# Patient Record
Sex: Male | Born: 1955 | Race: Black or African American | Hispanic: No | Smoking: Current every day smoker
Health system: Southern US, Community
[De-identification: ages and names within clinical notes are randomized; demographics above are authoritative.]

## PROBLEM LIST (undated history)

## (undated) DIAGNOSIS — I639 Cerebral infarction, unspecified: Secondary | ICD-10-CM

## (undated) DIAGNOSIS — D126 Benign neoplasm of colon, unspecified: Secondary | ICD-10-CM

## (undated) DIAGNOSIS — Z21 Asymptomatic human immunodeficiency virus [HIV] infection status: Secondary | ICD-10-CM

## (undated) DIAGNOSIS — C61 Malignant neoplasm of prostate: Secondary | ICD-10-CM

## (undated) DIAGNOSIS — K603 Anal fistula, unspecified: Secondary | ICD-10-CM

## (undated) DIAGNOSIS — G43909 Migraine, unspecified, not intractable, without status migrainosus: Secondary | ICD-10-CM

## (undated) DIAGNOSIS — K219 Gastro-esophageal reflux disease without esophagitis: Secondary | ICD-10-CM

## (undated) DIAGNOSIS — K5790 Diverticulosis of intestine, part unspecified, without perforation or abscess without bleeding: Secondary | ICD-10-CM

## (undated) DIAGNOSIS — G44219 Episodic tension-type headache, not intractable: Secondary | ICD-10-CM

## (undated) DIAGNOSIS — G44009 Cluster headache syndrome, unspecified, not intractable: Secondary | ICD-10-CM

## (undated) DIAGNOSIS — J189 Pneumonia, unspecified organism: Secondary | ICD-10-CM

## (undated) DIAGNOSIS — I10 Essential (primary) hypertension: Secondary | ICD-10-CM

## (undated) DIAGNOSIS — I700 Atherosclerosis of aorta: Secondary | ICD-10-CM

## (undated) DIAGNOSIS — K449 Diaphragmatic hernia without obstruction or gangrene: Secondary | ICD-10-CM

## (undated) DIAGNOSIS — B20 Human immunodeficiency virus [HIV] disease: Secondary | ICD-10-CM

## (undated) DIAGNOSIS — Z973 Presence of spectacles and contact lenses: Secondary | ICD-10-CM

## (undated) DIAGNOSIS — Z972 Presence of dental prosthetic device (complete) (partial): Secondary | ICD-10-CM

## (undated) DIAGNOSIS — K862 Cyst of pancreas: Secondary | ICD-10-CM

## (undated) DIAGNOSIS — K921 Melena: Secondary | ICD-10-CM

## (undated) HISTORY — DX: Essential (primary) hypertension: I10

## (undated) HISTORY — DX: Pneumonia, unspecified organism: J18.9

## (undated) HISTORY — DX: Human immunodeficiency virus (HIV) disease: B20

## (undated) HISTORY — DX: Melena: K92.1

## (undated) HISTORY — DX: Asymptomatic human immunodeficiency virus (hiv) infection status: Z21

## (undated) HISTORY — DX: Cerebral infarction, unspecified: I63.9

## (undated) HISTORY — DX: Diaphragmatic hernia without obstruction or gangrene: K44.9

## (undated) HISTORY — PX: OTHER SURGICAL HISTORY: SHX169

## (undated) HISTORY — PX: COLONOSCOPY: SHX174

## (undated) HISTORY — DX: Diverticulosis of intestine, part unspecified, without perforation or abscess without bleeding: K57.90

## (undated) HISTORY — DX: Anal fistula: K60.3

## (undated) HISTORY — DX: Migraine, unspecified, not intractable, without status migrainosus: G43.909

## (undated) HISTORY — DX: Anal fistula, unspecified: K60.30

## (undated) HISTORY — DX: Cyst of pancreas: K86.2

## (undated) HISTORY — DX: Benign neoplasm of colon, unspecified: D12.6

## (undated) HISTORY — DX: Atherosclerosis of aorta: I70.0

## (undated) HISTORY — DX: Cluster headache syndrome, unspecified, not intractable: G44.009

## (undated) HISTORY — DX: Episodic tension-type headache, not intractable: G44.219

---

## 1997-12-15 ENCOUNTER — Encounter (INDEPENDENT_AMBULATORY_CARE_PROVIDER_SITE_OTHER): Payer: Self-pay | Admitting: *Deleted

## 1997-12-15 LAB — CONVERTED CEMR LAB
CD4 Count: 320 microliters
CD4 T Cell Abs: 320

## 1998-01-15 ENCOUNTER — Encounter: Admission: RE | Admit: 1998-01-15 | Discharge: 1998-01-15 | Payer: Self-pay | Admitting: Internal Medicine

## 1998-01-15 ENCOUNTER — Encounter (HOSPITAL_COMMUNITY): Admission: RE | Admit: 1998-01-15 | Discharge: 1998-04-15 | Payer: Self-pay | Admitting: Psychiatry

## 1998-01-24 ENCOUNTER — Encounter: Admission: RE | Admit: 1998-01-24 | Discharge: 1998-01-24 | Payer: Self-pay | Admitting: *Deleted

## 1998-02-12 ENCOUNTER — Encounter: Admission: RE | Admit: 1998-02-12 | Discharge: 1998-02-12 | Payer: Self-pay | Admitting: Internal Medicine

## 1998-03-05 ENCOUNTER — Encounter: Admission: RE | Admit: 1998-03-05 | Discharge: 1998-03-05 | Payer: Self-pay | Admitting: *Deleted

## 1998-03-26 ENCOUNTER — Encounter: Admission: RE | Admit: 1998-03-26 | Discharge: 1998-03-26 | Payer: Self-pay | Admitting: Internal Medicine

## 1998-04-16 ENCOUNTER — Encounter: Admission: RE | Admit: 1998-04-16 | Discharge: 1998-04-16 | Payer: Self-pay | Admitting: *Deleted

## 1998-05-04 ENCOUNTER — Encounter: Admission: RE | Admit: 1998-05-04 | Discharge: 1998-05-04 | Payer: Self-pay | Admitting: Infectious Diseases

## 1998-05-22 ENCOUNTER — Encounter: Admission: RE | Admit: 1998-05-22 | Discharge: 1998-05-22 | Payer: Self-pay | Admitting: Internal Medicine

## 1998-08-16 ENCOUNTER — Encounter: Admission: RE | Admit: 1998-08-16 | Discharge: 1998-08-16 | Payer: Self-pay | Admitting: Internal Medicine

## 1998-09-10 ENCOUNTER — Encounter: Admission: RE | Admit: 1998-09-10 | Discharge: 1998-09-10 | Payer: Self-pay | Admitting: Internal Medicine

## 1998-11-19 ENCOUNTER — Ambulatory Visit (HOSPITAL_COMMUNITY): Admission: RE | Admit: 1998-11-19 | Discharge: 1998-11-19 | Payer: Self-pay | Admitting: Internal Medicine

## 1998-12-03 ENCOUNTER — Encounter: Admission: RE | Admit: 1998-12-03 | Discharge: 1998-12-03 | Payer: Self-pay | Admitting: Internal Medicine

## 1999-02-18 ENCOUNTER — Ambulatory Visit (HOSPITAL_COMMUNITY): Admission: RE | Admit: 1999-02-18 | Discharge: 1999-02-18 | Payer: Self-pay | Admitting: Internal Medicine

## 1999-03-05 ENCOUNTER — Encounter: Admission: RE | Admit: 1999-03-05 | Discharge: 1999-03-05 | Payer: Self-pay | Admitting: Internal Medicine

## 1999-08-13 ENCOUNTER — Encounter: Admission: RE | Admit: 1999-08-13 | Discharge: 1999-08-13 | Payer: Self-pay | Admitting: Internal Medicine

## 1999-08-13 ENCOUNTER — Ambulatory Visit (HOSPITAL_COMMUNITY): Admission: RE | Admit: 1999-08-13 | Discharge: 1999-08-13 | Payer: Self-pay | Admitting: Internal Medicine

## 1999-08-27 ENCOUNTER — Encounter: Admission: RE | Admit: 1999-08-27 | Discharge: 1999-08-27 | Payer: Self-pay | Admitting: Internal Medicine

## 2000-01-06 ENCOUNTER — Encounter: Admission: RE | Admit: 2000-01-06 | Discharge: 2000-01-06 | Payer: Self-pay | Admitting: Internal Medicine

## 2000-01-06 ENCOUNTER — Ambulatory Visit (HOSPITAL_COMMUNITY): Admission: RE | Admit: 2000-01-06 | Discharge: 2000-01-06 | Payer: Self-pay | Admitting: Internal Medicine

## 2000-01-20 ENCOUNTER — Encounter: Admission: RE | Admit: 2000-01-20 | Discharge: 2000-01-20 | Payer: Self-pay | Admitting: Internal Medicine

## 2000-08-06 ENCOUNTER — Ambulatory Visit (HOSPITAL_COMMUNITY): Admission: RE | Admit: 2000-08-06 | Discharge: 2000-08-06 | Payer: Self-pay | Admitting: Internal Medicine

## 2000-08-06 ENCOUNTER — Encounter: Admission: RE | Admit: 2000-08-06 | Discharge: 2000-08-06 | Payer: Self-pay | Admitting: Internal Medicine

## 2000-09-03 ENCOUNTER — Encounter: Admission: RE | Admit: 2000-09-03 | Discharge: 2000-09-03 | Payer: Self-pay | Admitting: Internal Medicine

## 2001-02-03 ENCOUNTER — Ambulatory Visit (HOSPITAL_COMMUNITY): Admission: RE | Admit: 2001-02-03 | Discharge: 2001-02-03 | Payer: Self-pay | Admitting: Infectious Diseases

## 2001-02-03 ENCOUNTER — Encounter: Admission: RE | Admit: 2001-02-03 | Discharge: 2001-02-03 | Payer: Self-pay | Admitting: Infectious Diseases

## 2001-02-17 ENCOUNTER — Encounter: Admission: RE | Admit: 2001-02-17 | Discharge: 2001-02-17 | Payer: Self-pay | Admitting: Internal Medicine

## 2001-08-06 ENCOUNTER — Emergency Department (HOSPITAL_COMMUNITY): Admission: EM | Admit: 2001-08-06 | Discharge: 2001-08-06 | Payer: Self-pay | Admitting: Emergency Medicine

## 2001-08-24 ENCOUNTER — Ambulatory Visit (HOSPITAL_COMMUNITY): Admission: RE | Admit: 2001-08-24 | Discharge: 2001-08-24 | Payer: Self-pay | Admitting: Internal Medicine

## 2001-08-24 ENCOUNTER — Encounter: Admission: RE | Admit: 2001-08-24 | Discharge: 2001-08-24 | Payer: Self-pay | Admitting: Internal Medicine

## 2001-09-07 ENCOUNTER — Encounter: Admission: RE | Admit: 2001-09-07 | Discharge: 2001-09-07 | Payer: Self-pay | Admitting: Internal Medicine

## 2003-10-12 ENCOUNTER — Encounter: Admission: RE | Admit: 2003-10-12 | Discharge: 2003-10-12 | Payer: Self-pay | Admitting: Internal Medicine

## 2003-10-12 ENCOUNTER — Ambulatory Visit (HOSPITAL_COMMUNITY): Admission: RE | Admit: 2003-10-12 | Discharge: 2003-10-12 | Payer: Self-pay | Admitting: Internal Medicine

## 2003-10-26 ENCOUNTER — Encounter: Admission: RE | Admit: 2003-10-26 | Discharge: 2003-10-26 | Payer: Self-pay | Admitting: Internal Medicine

## 2004-04-04 ENCOUNTER — Encounter: Admission: RE | Admit: 2004-04-04 | Discharge: 2004-04-04 | Payer: Self-pay | Admitting: Internal Medicine

## 2004-04-04 ENCOUNTER — Ambulatory Visit (HOSPITAL_COMMUNITY): Admission: RE | Admit: 2004-04-04 | Discharge: 2004-04-04 | Payer: Self-pay | Admitting: Internal Medicine

## 2004-04-18 ENCOUNTER — Ambulatory Visit: Payer: Self-pay | Admitting: Internal Medicine

## 2004-05-22 ENCOUNTER — Ambulatory Visit: Payer: Self-pay | Admitting: Internal Medicine

## 2004-06-27 ENCOUNTER — Ambulatory Visit: Payer: Self-pay | Admitting: Internal Medicine

## 2005-01-22 ENCOUNTER — Ambulatory Visit (HOSPITAL_COMMUNITY): Admission: RE | Admit: 2005-01-22 | Discharge: 2005-01-22 | Payer: Self-pay | Admitting: Internal Medicine

## 2005-01-22 ENCOUNTER — Ambulatory Visit: Payer: Self-pay | Admitting: Internal Medicine

## 2005-01-22 ENCOUNTER — Encounter (INDEPENDENT_AMBULATORY_CARE_PROVIDER_SITE_OTHER): Payer: Self-pay | Admitting: *Deleted

## 2005-01-22 LAB — CONVERTED CEMR LAB: CD4 Count: 680 microliters

## 2005-02-06 ENCOUNTER — Ambulatory Visit: Payer: Self-pay | Admitting: Internal Medicine

## 2005-05-12 ENCOUNTER — Ambulatory Visit (HOSPITAL_COMMUNITY): Admission: RE | Admit: 2005-05-12 | Discharge: 2005-05-12 | Payer: Self-pay | Admitting: Internal Medicine

## 2005-05-12 ENCOUNTER — Ambulatory Visit: Payer: Self-pay | Admitting: Internal Medicine

## 2005-05-27 ENCOUNTER — Ambulatory Visit: Payer: Self-pay | Admitting: Internal Medicine

## 2006-01-08 ENCOUNTER — Emergency Department (HOSPITAL_COMMUNITY): Admission: EM | Admit: 2006-01-08 | Discharge: 2006-01-08 | Payer: Self-pay | Admitting: Emergency Medicine

## 2006-04-28 ENCOUNTER — Ambulatory Visit: Payer: Self-pay | Admitting: Internal Medicine

## 2006-04-28 ENCOUNTER — Encounter (INDEPENDENT_AMBULATORY_CARE_PROVIDER_SITE_OTHER): Payer: Self-pay | Admitting: *Deleted

## 2006-04-28 ENCOUNTER — Encounter: Admission: RE | Admit: 2006-04-28 | Discharge: 2006-04-28 | Payer: Self-pay | Admitting: Internal Medicine

## 2006-04-28 LAB — CONVERTED CEMR LAB
CD4 Count: 840 microliters
HIV 1 RNA Quant: 417 copies/mL

## 2006-05-12 ENCOUNTER — Ambulatory Visit: Payer: Self-pay | Admitting: Internal Medicine

## 2006-08-26 ENCOUNTER — Encounter: Admission: RE | Admit: 2006-08-26 | Discharge: 2006-08-26 | Payer: Self-pay | Admitting: Internal Medicine

## 2006-08-26 ENCOUNTER — Ambulatory Visit: Payer: Self-pay | Admitting: Internal Medicine

## 2006-08-26 ENCOUNTER — Encounter (INDEPENDENT_AMBULATORY_CARE_PROVIDER_SITE_OTHER): Payer: Self-pay | Admitting: *Deleted

## 2006-08-26 LAB — CONVERTED CEMR LAB
ALT: 9 units/L (ref 0–53)
AST: 11 units/L (ref 0–37)
Albumin: 4.1 g/dL (ref 3.5–5.2)
Alkaline Phosphatase: 64 units/L (ref 39–117)
BUN: 15 mg/dL (ref 6–23)
Basophils Absolute: 0 10*3/uL (ref 0.0–0.1)
Basophils Relative: 0 % (ref 0–1)
CD4 Count: 680 microliters
CO2: 20 meq/L (ref 19–32)
Calcium: 9.2 mg/dL (ref 8.4–10.5)
Chloride: 110 meq/L (ref 96–112)
Cholesterol: 177 mg/dL (ref 0–200)
Creatinine, Ser: 0.94 mg/dL (ref 0.40–1.50)
Eosinophils Relative: 2 % (ref 0–5)
Glucose, Bld: 92 mg/dL (ref 70–99)
HCT: 38 % — ABNORMAL LOW (ref 39.0–52.0)
HDL: 37 mg/dL — ABNORMAL LOW (ref >39)
HIV 1 RNA Quant: 1370 copies/mL
HIV 1 RNA Quant: 1370 copies/mL — ABNORMAL HIGH (ref <50)
HIV-1 RNA Quant, Log: 3.14 — ABNORMAL HIGH (ref <1.70)
Hemoglobin: 13.3 g/dL (ref 13.0–17.0)
LDL Cholesterol: 108 mg/dL — ABNORMAL HIGH (ref 0–99)
Lymphocytes Relative: 72 % — ABNORMAL HIGH (ref 12–46)
Lymphs Abs: 3.7 10*3/uL — ABNORMAL HIGH (ref 0.7–3.3)
MCHC: 35 g/dL (ref 30.0–36.0)
MCV: 106.1 fL — ABNORMAL HIGH (ref 78.0–100.0)
Monocytes Absolute: 0.7 10*3/uL (ref 0.2–0.7)
Monocytes Relative: 14 % — ABNORMAL HIGH (ref 3–11)
Neutro Abs: 0.6 10*3/uL — ABNORMAL LOW (ref 1.7–7.7)
Neutrophils Relative %: 12 % — ABNORMAL LOW (ref 43–77)
Platelets: 274 10*3/uL (ref 150–400)
Potassium: 4.1 meq/L (ref 3.5–5.3)
RBC: 3.58 M/uL — ABNORMAL LOW (ref 4.22–5.81)
RDW: 14 % (ref 11.5–14.0)
Sodium: 139 meq/L (ref 135–145)
Total Bilirubin: 0.3 mg/dL (ref 0.3–1.2)
Total CHOL/HDL Ratio: 4.8
Total Protein: 7.2 g/dL (ref 6.0–8.3)
Triglycerides: 161 mg/dL — ABNORMAL HIGH (ref <150)
VLDL: 32 mg/dL (ref 0–40)
WBC: 5.1 10*3/uL (ref 4.0–10.5)

## 2006-09-17 ENCOUNTER — Encounter (INDEPENDENT_AMBULATORY_CARE_PROVIDER_SITE_OTHER): Payer: Self-pay | Admitting: *Deleted

## 2006-09-17 ENCOUNTER — Ambulatory Visit: Payer: Self-pay | Admitting: Internal Medicine

## 2006-09-17 DIAGNOSIS — K5730 Diverticulosis of large intestine without perforation or abscess without bleeding: Secondary | ICD-10-CM | POA: Insufficient documentation

## 2006-09-17 DIAGNOSIS — S62609A Fracture of unspecified phalanx of unspecified finger, initial encounter for closed fracture: Secondary | ICD-10-CM | POA: Insufficient documentation

## 2006-09-17 DIAGNOSIS — F1010 Alcohol abuse, uncomplicated: Secondary | ICD-10-CM | POA: Insufficient documentation

## 2006-09-17 DIAGNOSIS — B20 Human immunodeficiency virus [HIV] disease: Secondary | ICD-10-CM | POA: Insufficient documentation

## 2006-09-17 DIAGNOSIS — F528 Other sexual dysfunction not due to a substance or known physiological condition: Secondary | ICD-10-CM | POA: Insufficient documentation

## 2006-09-17 DIAGNOSIS — L0293 Carbuncle, unspecified: Secondary | ICD-10-CM

## 2006-09-17 DIAGNOSIS — F17200 Nicotine dependence, unspecified, uncomplicated: Secondary | ICD-10-CM | POA: Insufficient documentation

## 2006-09-17 DIAGNOSIS — L0292 Furuncle, unspecified: Secondary | ICD-10-CM | POA: Insufficient documentation

## 2006-09-17 DIAGNOSIS — J13 Pneumonia due to Streptococcus pneumoniae: Secondary | ICD-10-CM | POA: Insufficient documentation

## 2006-09-17 DIAGNOSIS — A6000 Herpesviral infection of urogenital system, unspecified: Secondary | ICD-10-CM | POA: Insufficient documentation

## 2006-09-17 LAB — CONVERTED CEMR LAB
HIV 1 RNA Quant: 451 copies/mL
HIV 1 RNA Quant: 451 copies/mL — ABNORMAL HIGH (ref <50)
HIV-1 RNA Quant, Log: 2.65 — ABNORMAL HIGH (ref <1.70)

## 2006-10-12 ENCOUNTER — Encounter (INDEPENDENT_AMBULATORY_CARE_PROVIDER_SITE_OTHER): Payer: Self-pay | Admitting: *Deleted

## 2006-10-12 LAB — CONVERTED CEMR LAB

## 2006-10-13 ENCOUNTER — Telehealth: Payer: Self-pay | Admitting: Infectious Diseases

## 2006-10-25 ENCOUNTER — Encounter (INDEPENDENT_AMBULATORY_CARE_PROVIDER_SITE_OTHER): Payer: Self-pay | Admitting: *Deleted

## 2006-11-23 ENCOUNTER — Telehealth: Payer: Self-pay | Admitting: Internal Medicine

## 2007-08-17 ENCOUNTER — Encounter: Payer: Self-pay | Admitting: Internal Medicine

## 2007-12-27 ENCOUNTER — Telehealth (INDEPENDENT_AMBULATORY_CARE_PROVIDER_SITE_OTHER): Payer: Self-pay | Admitting: *Deleted

## 2008-02-09 ENCOUNTER — Ambulatory Visit: Payer: Self-pay | Admitting: Internal Medicine

## 2008-02-09 ENCOUNTER — Encounter: Admission: RE | Admit: 2008-02-09 | Discharge: 2008-02-09 | Payer: Self-pay | Admitting: Internal Medicine

## 2008-02-09 ENCOUNTER — Encounter: Payer: Self-pay | Admitting: Infectious Diseases

## 2008-02-09 LAB — CONVERTED CEMR LAB
ALT: 8 units/L (ref 0–53)
AST: 13 units/L (ref 0–37)
Albumin: 4.2 g/dL (ref 3.5–5.2)
Alkaline Phosphatase: 62 units/L (ref 39–117)
BUN: 13 mg/dL (ref 6–23)
Basophils Absolute: 0 10*3/uL (ref 0.0–0.1)
Basophils Relative: 0 % (ref 0–1)
Bilirubin Urine: NEGATIVE
CO2: 22 meq/L (ref 19–32)
Calcium: 9.2 mg/dL (ref 8.4–10.5)
Chloride: 110 meq/L (ref 96–112)
Creatinine, Ser: 0.9 mg/dL (ref 0.40–1.50)
Eosinophils Absolute: 0.2 10*3/uL (ref 0.0–0.7)
Eosinophils Relative: 3 % (ref 0–5)
Glucose, Bld: 78 mg/dL (ref 70–99)
HCT: 35.3 % — ABNORMAL LOW (ref 39.0–52.0)
HIV 1 RNA Quant: 2410 copies/mL — ABNORMAL HIGH (ref <50)
HIV-1 RNA Quant, Log: 3.38 — ABNORMAL HIGH (ref <1.70)
Hemoglobin, Urine: NEGATIVE
Hemoglobin: 12.3 g/dL — ABNORMAL LOW (ref 13.0–17.0)
Ketones, ur: NEGATIVE mg/dL
Leukocytes, UA: NEGATIVE
Lymphocytes Relative: 70 % — ABNORMAL HIGH (ref 12–46)
Lymphs Abs: 4 10*3/uL (ref 0.7–4.0)
MCHC: 34.8 g/dL (ref 30.0–36.0)
MCV: 107.6 fL — ABNORMAL HIGH (ref 78.0–100.0)
Monocytes Absolute: 0.7 10*3/uL (ref 0.1–1.0)
Monocytes Relative: 12 % (ref 3–12)
Neutro Abs: 0.8 10*3/uL — ABNORMAL LOW (ref 1.7–7.7)
Neutrophils Relative %: 15 % — ABNORMAL LOW (ref 43–77)
Nitrite: NEGATIVE
Platelets: 250 10*3/uL (ref 150–400)
Potassium: 4.6 meq/L (ref 3.5–5.3)
Protein, ur: NEGATIVE mg/dL
RBC: 3.28 M/uL — ABNORMAL LOW (ref 4.22–5.81)
RDW: 14.8 % (ref 11.5–15.5)
Sodium: 139 meq/L (ref 135–145)
Specific Gravity, Urine: 1.028 (ref 1.005–1.03)
Total Bilirubin: 0.2 mg/dL — ABNORMAL LOW (ref 0.3–1.2)
Total Protein: 7.1 g/dL (ref 6.0–8.3)
Urine Glucose: NEGATIVE mg/dL
Urobilinogen, UA: 0.2 (ref 0.0–1.0)
WBC: 5.7 10*3/uL (ref 4.0–10.5)
pH: 6 (ref 5.0–8.0)

## 2008-02-21 ENCOUNTER — Ambulatory Visit: Payer: Self-pay | Admitting: Internal Medicine

## 2008-02-21 LAB — CONVERTED CEMR LAB
Cholesterol: 164 mg/dL (ref 0–200)
HDL: 46 mg/dL (ref >39)
LDL Cholesterol: 92 mg/dL (ref 0–99)
Total CHOL/HDL Ratio: 3.6
Triglycerides: 128 mg/dL (ref <150)
VLDL: 26 mg/dL (ref 0–40)

## 2008-02-29 ENCOUNTER — Encounter: Payer: Self-pay | Admitting: Internal Medicine

## 2008-03-28 ENCOUNTER — Ambulatory Visit: Payer: Self-pay | Admitting: Internal Medicine

## 2008-04-03 ENCOUNTER — Telehealth (INDEPENDENT_AMBULATORY_CARE_PROVIDER_SITE_OTHER): Payer: Self-pay | Admitting: *Deleted

## 2008-05-01 ENCOUNTER — Telehealth: Payer: Self-pay | Admitting: Internal Medicine

## 2008-05-11 ENCOUNTER — Ambulatory Visit: Payer: Self-pay | Admitting: Internal Medicine

## 2008-05-11 LAB — CONVERTED CEMR LAB
ALT: <8 units/L (ref 0–53)
AST: 10 units/L (ref 0–37)
Albumin: 4 g/dL (ref 3.5–5.2)
Alkaline Phosphatase: 68 units/L (ref 39–117)
BUN: 12 mg/dL (ref 6–23)
Basophils Absolute: 0 10*3/uL (ref 0.0–0.1)
Basophils Relative: 0 % (ref 0–1)
CO2: 19 meq/L (ref 19–32)
Calcium: 9.2 mg/dL (ref 8.4–10.5)
Chloride: 109 meq/L (ref 96–112)
Creatinine, Ser: 1.12 mg/dL (ref 0.40–1.50)
Eosinophils Absolute: 0.1 10*3/uL (ref 0.0–0.7)
Eosinophils Relative: 2 % (ref 0–5)
Glucose, Bld: 77 mg/dL (ref 70–99)
HCT: 32.8 % — ABNORMAL LOW (ref 39.0–52.0)
HIV 1 RNA Quant: 53 copies/mL — ABNORMAL HIGH (ref <50)
HIV-1 RNA Quant, Log: 1.72 — ABNORMAL HIGH (ref <1.70)
Hemoglobin: 11.6 g/dL — ABNORMAL LOW (ref 13.0–17.0)
Lymphocytes Relative: 71 % — ABNORMAL HIGH (ref 12–46)
Lymphs Abs: 3.9 10*3/uL (ref 0.7–4.0)
MCHC: 35.4 g/dL (ref 30.0–36.0)
MCV: 106.1 fL — ABNORMAL HIGH (ref 78.0–100.0)
Monocytes Absolute: 0.6 10*3/uL (ref 0.1–1.0)
Monocytes Relative: 10 % (ref 3–12)
Neutro Abs: 0.9 10*3/uL — ABNORMAL LOW (ref 1.7–7.7)
Neutrophils Relative %: 16 % — ABNORMAL LOW (ref 43–77)
Platelets: 258 10*3/uL (ref 150–400)
Potassium: 4.6 meq/L (ref 3.5–5.3)
RBC: 3.09 M/uL — ABNORMAL LOW (ref 4.22–5.81)
RDW: 14.5 % (ref 11.5–15.5)
Sodium: 136 meq/L (ref 135–145)
Total Bilirubin: 0.3 mg/dL (ref 0.3–1.2)
Total Protein: 7.1 g/dL (ref 6.0–8.3)
WBC: 5.5 10*3/uL (ref 4.0–10.5)

## 2008-05-29 ENCOUNTER — Ambulatory Visit: Payer: Self-pay | Admitting: Internal Medicine

## 2008-05-29 DIAGNOSIS — L0591 Pilonidal cyst without abscess: Secondary | ICD-10-CM | POA: Insufficient documentation

## 2008-09-25 ENCOUNTER — Emergency Department (HOSPITAL_COMMUNITY): Admission: EM | Admit: 2008-09-25 | Discharge: 2008-09-25 | Payer: Self-pay | Admitting: Emergency Medicine

## 2008-09-27 ENCOUNTER — Emergency Department (HOSPITAL_COMMUNITY): Admission: EM | Admit: 2008-09-27 | Discharge: 2008-09-27 | Payer: Self-pay | Admitting: Emergency Medicine

## 2008-09-29 ENCOUNTER — Telehealth (INDEPENDENT_AMBULATORY_CARE_PROVIDER_SITE_OTHER): Payer: Self-pay | Admitting: *Deleted

## 2008-10-03 ENCOUNTER — Ambulatory Visit: Payer: Self-pay | Admitting: Internal Medicine

## 2008-10-03 DIAGNOSIS — R630 Anorexia: Secondary | ICD-10-CM | POA: Insufficient documentation

## 2008-10-03 LAB — CONVERTED CEMR LAB
ALT: 9 units/L (ref 0–53)
AST: 15 units/L (ref 0–37)
Albumin: 4 g/dL (ref 3.5–5.2)
Alkaline Phosphatase: 75 units/L (ref 39–117)
BUN: 10 mg/dL (ref 6–23)
Basophils Absolute: 0 10*3/uL (ref 0.0–0.1)
Basophils Relative: 0 % (ref 0–1)
CO2: 20 meq/L (ref 19–32)
Calcium: 9 mg/dL (ref 8.4–10.5)
Chloride: 106 meq/L (ref 96–112)
Cholesterol: 159 mg/dL (ref 0–200)
Creatinine, Ser: 0.98 mg/dL (ref 0.40–1.50)
Eosinophils Absolute: 0.1 10*3/uL (ref 0.0–0.7)
Eosinophils Relative: 1 % (ref 0–5)
Glucose, Bld: 99 mg/dL (ref 70–99)
HCT: 34.5 % — ABNORMAL LOW (ref 39.0–52.0)
HDL: 50 mg/dL (ref >39)
HIV 1 RNA Quant: 381 copies/mL — ABNORMAL HIGH (ref <48)
HIV-1 RNA Quant, Log: 2.58 — ABNORMAL HIGH (ref <1.68)
Hemoglobin: 12 g/dL — ABNORMAL LOW (ref 13.0–17.0)
LDL Cholesterol: 88 mg/dL (ref 0–99)
Lymphocytes Relative: 58 % — ABNORMAL HIGH (ref 12–46)
Lymphs Abs: 2.9 10*3/uL (ref 0.7–4.0)
MCHC: 34.8 g/dL (ref 30.0–36.0)
MCV: 105.5 fL — ABNORMAL HIGH (ref 78.0–100.0)
Monocytes Absolute: 0.4 10*3/uL (ref 0.1–1.0)
Monocytes Relative: 9 % (ref 3–12)
Neutro Abs: 1.6 10*3/uL — ABNORMAL LOW (ref 1.7–7.7)
Neutrophils Relative %: 31 % — ABNORMAL LOW (ref 43–77)
Platelets: 313 10*3/uL (ref 150–400)
Potassium: 4.4 meq/L (ref 3.5–5.3)
RBC: 3.27 M/uL — ABNORMAL LOW (ref 4.22–5.81)
RDW: 13.6 % (ref 11.5–15.5)
Sodium: 136 meq/L (ref 135–145)
Total Bilirubin: 0.6 mg/dL (ref 0.3–1.2)
Total CHOL/HDL Ratio: 3.2
Total Protein: 7.3 g/dL (ref 6.0–8.3)
Triglycerides: 104 mg/dL (ref <150)
VLDL: 21 mg/dL (ref 0–40)
WBC: 5 10*3/uL (ref 4.0–10.5)

## 2008-10-24 ENCOUNTER — Emergency Department (HOSPITAL_COMMUNITY): Admission: EM | Admit: 2008-10-24 | Discharge: 2008-10-24 | Payer: Self-pay | Admitting: Family Medicine

## 2008-12-21 ENCOUNTER — Ambulatory Visit: Payer: Self-pay | Admitting: Internal Medicine

## 2008-12-21 LAB — CONVERTED CEMR LAB
ALT: 13 units/L (ref 0–53)
AST: 14 units/L (ref 0–37)
Albumin: 4.1 g/dL (ref 3.5–5.2)
Alkaline Phosphatase: 78 units/L (ref 39–117)
BUN: 13 mg/dL (ref 6–23)
Basophils Absolute: 0 10*3/uL (ref 0.0–0.1)
Basophils Relative: 0 % (ref 0–1)
CO2: 21 meq/L (ref 19–32)
Calcium: 9.2 mg/dL (ref 8.4–10.5)
Chloride: 106 meq/L (ref 96–112)
Cholesterol: 157 mg/dL (ref 0–200)
Creatinine, Ser: 1.02 mg/dL (ref 0.40–1.50)
Eosinophils Absolute: 0.1 10*3/uL (ref 0.0–0.7)
Eosinophils Relative: 2 % (ref 0–5)
GFR calc Af Amer: >60 mL/min (ref >60)
GFR calc non Af Amer: >60 mL/min (ref >60)
Glucose, Bld: 145 mg/dL — ABNORMAL HIGH (ref 70–99)
HCT: 33.9 % — ABNORMAL LOW (ref 39.0–52.0)
HDL: 50 mg/dL (ref >39)
HIV 1 RNA Quant: 264 copies/mL — ABNORMAL HIGH (ref <48)
HIV-1 RNA Quant, Log: 2.42 — ABNORMAL HIGH (ref <1.68)
Hemoglobin: 11.9 g/dL — ABNORMAL LOW (ref 13.0–17.0)
LDL Cholesterol: 72 mg/dL (ref 0–99)
Lymphocytes Relative: 61 % — ABNORMAL HIGH (ref 12–46)
Lymphs Abs: 3.4 10*3/uL (ref 0.7–4.0)
MCHC: 35.1 g/dL (ref 30.0–36.0)
MCV: 109.7 fL — ABNORMAL HIGH (ref 78.0–100.0)
Monocytes Absolute: 0.5 10*3/uL (ref 0.1–1.0)
Monocytes Relative: 8 % (ref 3–12)
Neutro Abs: 1.6 10*3/uL — ABNORMAL LOW (ref 1.7–7.7)
Neutrophils Relative %: 28 % — ABNORMAL LOW (ref 43–77)
Platelets: 244 10*3/uL (ref 150–400)
Potassium: 3.9 meq/L (ref 3.5–5.3)
RBC: 3.09 M/uL — ABNORMAL LOW (ref 4.22–5.81)
RDW: 14.9 % (ref 11.5–15.5)
Sodium: 137 meq/L (ref 135–145)
Total Bilirubin: 0.3 mg/dL (ref 0.3–1.2)
Total CHOL/HDL Ratio: 3.1
Total Protein: 7.1 g/dL (ref 6.0–8.3)
Triglycerides: 174 mg/dL — ABNORMAL HIGH (ref <150)
VLDL: 35 mg/dL (ref 0–40)
WBC: 5.5 10*3/uL (ref 4.0–10.5)

## 2009-01-01 ENCOUNTER — Telehealth (INDEPENDENT_AMBULATORY_CARE_PROVIDER_SITE_OTHER): Payer: Self-pay | Admitting: *Deleted

## 2009-01-02 ENCOUNTER — Ambulatory Visit: Payer: Self-pay | Admitting: Internal Medicine

## 2009-01-02 DIAGNOSIS — Z9189 Other specified personal risk factors, not elsewhere classified: Secondary | ICD-10-CM | POA: Insufficient documentation

## 2009-01-09 ENCOUNTER — Telehealth (INDEPENDENT_AMBULATORY_CARE_PROVIDER_SITE_OTHER): Payer: Self-pay | Admitting: *Deleted

## 2009-02-14 ENCOUNTER — Telehealth: Payer: Self-pay | Admitting: Internal Medicine

## 2009-04-04 ENCOUNTER — Encounter: Payer: Self-pay | Admitting: Internal Medicine

## 2009-05-03 ENCOUNTER — Encounter: Payer: Self-pay | Admitting: Internal Medicine

## 2009-08-06 ENCOUNTER — Encounter: Payer: Self-pay | Admitting: Internal Medicine

## 2009-12-31 ENCOUNTER — Telehealth: Payer: Self-pay | Admitting: Internal Medicine

## 2010-01-02 ENCOUNTER — Encounter: Payer: Self-pay | Admitting: Internal Medicine

## 2010-01-02 DIAGNOSIS — G5600 Carpal tunnel syndrome, unspecified upper limb: Secondary | ICD-10-CM | POA: Insufficient documentation

## 2010-01-15 ENCOUNTER — Encounter: Payer: Self-pay | Admitting: Internal Medicine

## 2010-01-31 ENCOUNTER — Encounter: Admission: RE | Admit: 2010-01-31 | Discharge: 2010-04-24 | Payer: Self-pay | Admitting: Internal Medicine

## 2010-02-06 ENCOUNTER — Ambulatory Visit: Payer: Self-pay | Admitting: Internal Medicine

## 2010-02-06 LAB — CONVERTED CEMR LAB
HIV 1 RNA Quant: <48 copies/mL (ref <48)
HIV-1 RNA Quant, Log: <1.68 (ref <1.68)

## 2010-02-21 ENCOUNTER — Ambulatory Visit: Payer: Self-pay | Admitting: Internal Medicine

## 2010-02-21 LAB — CONVERTED CEMR LAB
Cholesterol: 158 mg/dL (ref 0–200)
HDL: 62 mg/dL (ref >39)
LDL Cholesterol: 75 mg/dL (ref 0–99)
Total CHOL/HDL Ratio: 2.5
Triglycerides: 105 mg/dL (ref <150)
VLDL: 21 mg/dL (ref 0–40)

## 2010-04-19 ENCOUNTER — Emergency Department (HOSPITAL_COMMUNITY): Admission: EM | Admit: 2010-04-19 | Discharge: 2010-04-19 | Payer: Self-pay | Admitting: Emergency Medicine

## 2010-05-01 ENCOUNTER — Ambulatory Visit: Payer: Self-pay | Admitting: Internal Medicine

## 2010-05-01 DIAGNOSIS — G43909 Migraine, unspecified, not intractable, without status migrainosus: Secondary | ICD-10-CM | POA: Insufficient documentation

## 2010-05-01 DIAGNOSIS — I10 Essential (primary) hypertension: Secondary | ICD-10-CM | POA: Insufficient documentation

## 2010-05-02 ENCOUNTER — Telehealth: Payer: Self-pay | Admitting: Internal Medicine

## 2010-05-08 ENCOUNTER — Telehealth: Payer: Self-pay | Admitting: Internal Medicine

## 2010-05-30 ENCOUNTER — Telehealth: Payer: Self-pay | Admitting: Internal Medicine

## 2010-06-17 ENCOUNTER — Encounter: Payer: Self-pay | Admitting: Internal Medicine

## 2010-06-20 ENCOUNTER — Ambulatory Visit (HOSPITAL_COMMUNITY): Admission: RE | Admit: 2010-06-20 | Discharge: 2010-06-20 | Payer: Self-pay | Admitting: General Surgery

## 2010-08-21 ENCOUNTER — Telehealth (INDEPENDENT_AMBULATORY_CARE_PROVIDER_SITE_OTHER): Payer: Self-pay | Admitting: *Deleted

## 2010-08-22 ENCOUNTER — Ambulatory Visit
Admission: RE | Admit: 2010-08-22 | Discharge: 2010-08-22 | Payer: Self-pay | Source: Home / Self Care | Attending: Internal Medicine | Admitting: Internal Medicine

## 2010-08-22 ENCOUNTER — Encounter: Payer: Self-pay | Admitting: Internal Medicine

## 2010-08-22 LAB — CONVERTED CEMR LAB
ALT: 10 units/L (ref 0–53)
AST: 15 units/L (ref 0–37)
Albumin: 4.2 g/dL (ref 3.5–5.2)
Alkaline Phosphatase: 66 units/L (ref 39–117)
BUN: 11 mg/dL (ref 6–23)
Basophils Absolute: 0 10*3/uL (ref 0.0–0.1)
Basophils Relative: 0 % (ref 0–1)
CO2: 23 meq/L (ref 19–32)
Calcium: 8.8 mg/dL (ref 8.4–10.5)
Chloride: 108 meq/L (ref 96–112)
Cholesterol: 145 mg/dL (ref 0–200)
Creatinine, Ser: 0.96 mg/dL (ref 0.40–1.50)
Eosinophils Absolute: 0.1 10*3/uL (ref 0.0–0.7)
Eosinophils Relative: 2 % (ref 0–5)
Glucose, Bld: 75 mg/dL (ref 70–99)
HCT: 34.4 % — ABNORMAL LOW (ref 39.0–52.0)
HDL: 51 mg/dL (ref >39)
Hemoglobin: 12 g/dL — ABNORMAL LOW (ref 13.0–17.0)
LDL Cholesterol: 70 mg/dL (ref 0–99)
Lymphocytes Relative: 59 % — ABNORMAL HIGH (ref 12–46)
Lymphs Abs: 3.2 10*3/uL (ref 0.7–4.0)
MCHC: 34.9 g/dL (ref 30.0–36.0)
MCV: 110.6 fL — ABNORMAL HIGH (ref 78.0–100.0)
Monocytes Absolute: 0.5 10*3/uL (ref 0.1–1.0)
Monocytes Relative: 10 % (ref 3–12)
Neutro Abs: 1.7 10*3/uL (ref 1.7–7.7)
Neutrophils Relative %: 30 % — ABNORMAL LOW (ref 43–77)
Platelets: 262 10*3/uL (ref 150–400)
Potassium: 4.1 meq/L (ref 3.5–5.3)
RBC: 3.11 M/uL — ABNORMAL LOW (ref 4.22–5.81)
RDW: 14.8 % (ref 11.5–15.5)
Sodium: 139 meq/L (ref 135–145)
Total Bilirubin: 0.5 mg/dL (ref 0.3–1.2)
Total CHOL/HDL Ratio: 2.8
Total Protein: 7 g/dL (ref 6.0–8.3)
Triglycerides: 121 mg/dL (ref <150)
VLDL: 24 mg/dL (ref 0–40)
WBC: 5.5 10*3/uL (ref 4.0–10.5)

## 2010-08-23 ENCOUNTER — Encounter: Payer: Self-pay | Admitting: Internal Medicine

## 2010-08-23 LAB — T-HELPER CELL (CD4) - (RCID CLINIC ONLY)
CD4 % Helper T Cell: 26 % — ABNORMAL LOW (ref 33–55)
CD4 T Cell Abs: 820 uL (ref 400–2700)

## 2010-08-23 LAB — CONVERTED CEMR LAB
HIV 1 RNA Quant: <20 copies/mL (ref <20)
HIV-1 RNA Quant, Log: <1.3 (ref <1.30)

## 2010-09-19 ENCOUNTER — Ambulatory Visit: Admit: 2010-09-19 | Payer: Self-pay | Admitting: Internal Medicine

## 2010-09-19 ENCOUNTER — Encounter: Payer: Self-pay | Admitting: Internal Medicine

## 2010-09-19 ENCOUNTER — Encounter (INDEPENDENT_AMBULATORY_CARE_PROVIDER_SITE_OTHER): Payer: 59 | Admitting: Internal Medicine

## 2010-09-19 DIAGNOSIS — B20 Human immunodeficiency virus [HIV] disease: Secondary | ICD-10-CM

## 2010-09-19 DIAGNOSIS — F17200 Nicotine dependence, unspecified, uncomplicated: Secondary | ICD-10-CM

## 2010-09-19 DIAGNOSIS — G44019 Episodic cluster headache, not intractable: Secondary | ICD-10-CM

## 2010-09-19 DIAGNOSIS — IMO0002 Reserved for concepts with insufficient information to code with codable children: Secondary | ICD-10-CM

## 2010-09-19 NOTE — Progress Notes (Signed)
Summary: Pt. has outstanding balance, must make payment arrangements  Phone Note From Other Clinic   Caller: [facility] Call For: Dr. Cliffton Asters Summary of Call: Pt. has an outstanding balance at their practice.  The pt. has been notified to call their practice to arrange payment.  An appt. will not be made until these arrangements are completed. Jennet Maduro RN  May 08, 2010 9:18 AM     D

## 2010-09-19 NOTE — Progress Notes (Signed)
Summary: HIV rx co-payments too expensive for pt.  Phone Note Call from Patient Call back at Work Phone [phone]   Caller: Patient Reason for Call: Talk to Nurse Summary of Call: Pt's  out-of-pocket expenses too high for pt. to afford his HIV medications.  RN spoke with patient.  Offered Co-pay cards for each of his HIV rxes.  Also, printed out PAP applications for each of his rxes for him to consider application.  Documents left at the front desk for pt. pick up. Jennet Maduro RN  August 21, 2010 5:42 PM

## 2010-09-19 NOTE — Consult Note (Signed)
Summary: [facility]   Imported By: Florinda Marker 07/01/2010 16:22:51  _____________________________________________________________________  External Attachment:    Type:   Image     Comment:   External Document

## 2010-09-19 NOTE — Progress Notes (Signed)
Summary: Headache cont.  Ref to Neurology, rx for Ultram 50  Phone Note Call from Patient Call back at Home Phone [phone]   Caller: Patient Call For: Daniel Asters MD Reason for Call: Acute Illness, Talk to Nurse Action Taken: Phone Call Completed Summary of Call: Headache came back this morning and the pt. had to leave work.  Wanting to know what to do.  RN spoke to [employer].  Ordered referral to Neurologist. Jennet Maduro RN  May 02, 2010 4:25 PM new rx order from Dr. [name].  Ultram 50 mg, Take 1 tablet by mouth every 6 hours as needed for headache.  Take at the start of the pain.   #60.  Pt. verbalized understanding.  to call in to [facility] on [address]. Jennet Maduro RN  May 02, 2010 4:57 PM     New/Updated Medications: TRAMADOL HCL 50 MG TABS (TRAMADOL HCL) Take 1 tablet by mouth every 6 hours as needed for headache.  Take tablet as soon as headache begins. Prescriptions: TRAMADOL HCL 50 MG TABS (TRAMADOL HCL) Take 1 tablet by mouth every 6 hours as needed for headache.  Take tablet as soon as headache begins.  #60 x 0   Entered by:   Jennet Maduro RN   Authorized by:   Daniel Asters MD   Signed by:   Jennet Maduro RN on 05/02/2010   Method used:   Telephoned to ...       [facility] [id number] (retail)       [address] [place], [place]  [address]       Ph: [phone]       Fax: [phone]   RxID:   [id number]

## 2010-09-19 NOTE — Medication Information (Signed)
Summary: Prescription Solution: Medication  Prescription Solution: Medication   Imported By: Florinda Marker 08/29/2009 14:13:05  _____________________________________________________________________  External Attachment:    Type:   Image     Comment:   External Document

## 2010-09-19 NOTE — Progress Notes (Signed)
Summary: Recurrent pilonidal cyst, pt. requested appt w/ surgeon, arrange  Phone Note Call from Patient Call back at Work Phone [phone]   Caller: Patient Call For: Cliffton Asters MD Reason for Call: Talk to Nurse, Talk to Doctor Summary of Call: Pilonidal cyst has recurred according to the pt.  Was previously unable to schedule the time away from work to have the surgery.  Had seen Dr. Lurene Shadow at [facility].  Requesting f/u appt. at [facility] to evaluate and schedule the surgery.  Phone call the [facility].  Appt. scheduled for Wed., Oct. 26 @ 3:20 w/ Dr. Consuello Bossier.  Pt. called and given appt. information.  Repeat back info.  Jennet Maduro RN  May 30, 2010 10:06 AM

## 2010-09-19 NOTE — Assessment & Plan Note (Signed)
Summary: f/u ov/vs   CC:  follow-up visit.  History of Present Illness: Daniel Gonzalez is in for his first visit since May of last year.  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  There were a few occasions when he had trouble getting his medications refilled causing him to miss two to 3 days in the past year. However he is now using prescription solutions and does not feel like he's missed any medicines in the last few months. He broke up with his girlfriend and says he has not been sexually active in the last few months but says that he does have condoms and knows to use them if he does become section active with a new partner again.  Preventive Screening-Counseling & Management  Alcohol-Tobacco     Alcohol drinks/day: 1     Alcohol type: beer     Smoking Status: current     Smoking Cessation Counseling: yes     Packs/Day: 0.5     Cans of tobacco/week: no     Passive Smoke Exposure: yes  Caffeine-Diet-Exercise     Caffeine use/day: yes     Does Patient Exercise: no     Type of exercise: working two jobs     Times/week: 7  Hep-HIV-STD-Contraception     HIV Risk: no risk noted  Safety-Violence-Falls     Seat Belt Use: yes  Comments: declined condoms      Sexual History:  n/a.        Drug Use:  former.     Updated Prior Medication List: COMBIVIR 150-300 MG TABS (LAMIVUDINE-ZIDOVUDINE) Take 1 tablet by mouth two times a day VIREAD 300 MG TABS (TENOFOVIR DISOPROXIL FUMARATE) Take 1 tablet by mouth once a day KALETRA 200-50 MG TABS (LOPINAVIR-RITONAVIR) Take 2 tablets by mouth twice a day VALTREX 500 MG TABS (VALACYCLOVIR HCL) Take 1 tablet by mouth two times a day as neede VIAGRA 100 MG TABS (SILDENAFIL CITRATE) Take as directed  Current Allergies (reviewed today): No known allergies  Social History: Sexual History:  n/a Drug Use:  former  Vital Signs:  Patient profile:   55 year old male Height:      64 inches (162.56  cm) Weight:      128.5 pounds (58.41 kg) BMI:     22.14 Temp:     98.1 degrees F (36.72 degrees C) oral Pulse rate:   59 / minute BP sitting:   149 / 87  (left arm) Cuff size:   regular  Vitals Entered By: Jennet Maduro RN (February 21, 2010 9:46 AM) CC: follow-up visit Is Patient Diabetic? No Pain Assessment Patient in pain? yes     Location: right wrist Intensity: 5 Type: shooting Onset of pain  Intermittent Nutritional Status BMI of 19 -24 = normal Nutritional Status Detail appetite "not as good as I would like it to be."  Have you ever been in a relationship where you felt threatened, hurt or afraid?No   Does patient need assistance? Functional Status Self care Ambulation Normal Comments no missed doses of rxes   Physical Exam  General:  alert and well-nourished.   Mouth:  pharynx pink and moist, no erythema, no exudates, and fair dentition.   Lungs:  normal breath sounds.  no crackles and no wheezes.   Heart:  normal rate, regular rhythm, and no murmur.          Medication Adherence: 02/21/2010  Adherence to medications reviewed with patient. Counseling to provide adequate adherence provided   Prevention For Positives: 02/21/2010   Safe sex practices discussed with patient. Condoms offered.                             Impression & Recommendations:  Problem # 1:  HIV DISEASE (ICD-042) I will continue his current regimen. His updated medication list for this problem includes:    Valtrex 500 Mg Tabs (Valacyclovir hcl) .Marland Kitchen... Take 1 tablet by mouth two times a day as neede  Diagnostics Reviewed:  CD4: 880 (02/07/2010)   WBC: 5.5 (12/21/2008)   Hgb: 11.9 (12/21/2008)   HCT: 33.9 (12/21/2008)   Platelets: 244 (12/21/2008) HIV genotype: See Comment (02/21/2008)   HIV-1 RNA: <48 copies/mL (02/06/2010)   HBSAg: No (10/12/2006)  Other Orders: T-Lipid Profile ([id number]) T-RPR (Syphilis) (09811-91478) Est. Patient Level III (29[address]) Future Orders: T-CD4SP  ([facility]) (CD4SP) ... 08/20/2010 T-HIV Viral Load [phone]) ... 08/20/2010 T-Comprehensive Metabolic Panel [phone]) ... 08/20/2010 T-CBC w/Diff ([id number]) ... 08/20/2010 T-RPR (Syphilis) [id number]) ... 08/20/2010 T-Lipid Profile [phone]) ... 08/20/2010  Patient Instructions: 1)  Please schedule a follow-up appointment in 6 months.  Prescriptions: VIAGRA 100 MG TABS (SILDENAFIL CITRATE) Take as directed  #30 x 3   Entered and Authorized by:   Cliffton Asters MD   Signed by:   Cliffton Asters MD on 02/21/2010   Method used:   Faxed to ...       [facility]             , [place]         Ph:        Fax: [phone]   RxID:   [id number] KALETRA 200-50 MG TABS (LOPINAVIR-RITONAVIR) Take 2 tablets by mouth twice a day  #120 x 11   Entered and Authorized by:   Cliffton Asters MD   Signed by:   Cliffton Asters MD on 02/21/2010   Method used:   Faxed to ...       [facility]order)             , [place]         Ph:        Fax: [phone]   RxID:   [id number] VIREAD 300 MG TABS (TENOFOVIR DISOPROXIL FUMARATE) Take 1 tablet by mouth once a day  #30 x 11   Entered and Authorized by:   Cliffton Asters MD   Signed by:   Cliffton Asters MD on 02/21/2010   Method used:   Faxed to ...       [facility]order)             , [place]         Ph:        Fax: [phone]   RxID:   [id number] COMBIVIR 150-300 MG TABS (LAMIVUDINE-ZIDOVUDINE) Take 1 tablet by mouth two times a day  #60 x 11   Entered and Authorized by:   Cliffton Asters MD   Signed by:   Cliffton Asters MD on 02/21/2010   Method used:   Faxed to ...       [facility]order[facility]             , [place]         Ph:        Fax: [phone]   RxID:  [id number]  

## 2010-09-19 NOTE — Miscellaneous (Signed)
Summary: Orders Update - PT/rehab referral  Clinical Lists Changes  Problems: Added new problem of CARPAL TUNNEL SYNDROME (ICD-354.0) Orders: Added new Referral order of Physical Therapy Referral (PT) - Signed  Appended Document: Orders Update - PT/rehab referral Changed to OT due to upper extremity problem.

## 2010-09-19 NOTE — Miscellaneous (Signed)
Summary: [facility]  [facility]   Imported By: Florinda Marker 02/25/2010 14:38:53  _____________________________________________________________________  External Attachment:    Type:   Image     Comment:   External Document

## 2010-09-19 NOTE — Progress Notes (Signed)
Summary: Referal request  Phone Note Call from Patient   Summary of Call: Pt says he was sent to Dr Anne Hahn by Dr [name].   He says he was told he needed to see someone for carpel tunnell.  Pt is requesting a referral.   Tomasita Morrow RN  Dec 31, 2009 12:16 PM   Follow-up for Phone Call        I would start with a referral to PT/rehab. Follow-up by: Cliffton Asters MD,  Jan 01, 2010 3:17 PM

## 2010-09-19 NOTE — Miscellaneous (Signed)
Summary: Orders  - OT referral  Clinical Lists Changes  Orders: Added new Referral order of Occupational Therapy (OT) - Signed  Appended Document: Orders  - OT referral First appt. June 16th for evaluation.  Therapy to start 02/22/2010 at [facility].

## 2010-09-19 NOTE — Assessment & Plan Note (Addendum)
Summary: c/o headaches/jc   CC:  headaches started 3 weeks ago, intermittent, and stopped salt and ETOH.  History of Present Illness: Daniel Gonzalez is in for a visit after being seen in the emergency department on September 2 for recent onset of left-sided headaches.  He says they began several weeks ago.  The headaches occur several times each day.  They are always on the left side and frequently associated with tearing of his left eye and conjunctival injection.  He describes the pain as sharp.  He has been taking Aleve without much relief.  They can occur any time of day.  He does not know of anything that seems to cause the headaches or make them worse.  When they occur and are severe he tries to take a nap for which seems to help.  These headaches are different than the mild or chronic headaches he's had in the past.  Those tended to be posterior and bilateral.  He has not had any recent head injury.  He's not had any sinus congestion or cold symptoms.  He has not had any fever.  He thinks he may have missed only one dose of his HIV medicines in the past month.  He continues physical therapy for his right carpal tunnel syndrome.  Preventive Screening-Counseling & Management  Alcohol-Tobacco     Alcohol drinks/day: <1     Alcohol type: beer     Smoking Status: current     Smoking Cessation Counseling: yes     Packs/Day: 0.5     Cans of tobacco/week: no     Passive Smoke Exposure: yes  Caffeine-Diet-Exercise     Caffeine use/day: yes     Does Patient Exercise: no     Type of exercise: working two jobs     Times/week: 7  Hep-HIV-STD-Contraception     HIV Risk: no risk noted     HIV Risk Counseling: not indicated-no HIV risk noted  Safety-Violence-Falls     Seat Belt Use: yes      Sexual History:  n/a.        Drug Use:  never.     Prior Medication List:  COMBIVIR 150-300 MG TABS (LAMIVUDINE-ZIDOVUDINE) Take 1 tablet by mouth two times a day VIREAD 300 MG TABS (TENOFOVIR DISOPROXIL  FUMARATE) Take 1 tablet by mouth once a day KALETRA 200-50 MG TABS (LOPINAVIR-RITONAVIR) Take 2 tablets by mouth twice a day VALTREX 500 MG TABS (VALACYCLOVIR HCL) Take 1 tablet by mouth two times a day as neede VIAGRA 100 MG TABS (SILDENAFIL CITRATE) Take as directed   Current Allergies (reviewed today): No known allergies  Social History: Drug Use:  never  Review of Systems  The patient denies anorexia, fever, vision loss, decreased hearing, and syncope.    Vital Signs:  Patient profile:   55 year old male Height:      64 inches (162.56 cm) Weight:      132 pounds (60.00 kg) BMI:     22.74 Temp:     97.9 degrees F (36.61 degrees C) oral Pulse rate:   55 / minute BP sitting:   154 / 90  (left arm) Cuff size:   regular  Vitals Entered By: Jennet Maduro RN (May 01, 2010 2:45 PM) CC: headaches started 3 weeks ago, intermittent, stopped salt and ETOH Is Patient Diabetic? No Pain Assessment Patient in pain? yes     Location: headaches Intensity: 5 Type: aching Onset of pain  Intermittent Nutritional Status BMI of 19 -24 =  normal Nutritional Status Detail appetite "great"  Have you ever been in a relationship where you felt threatened, hurt or afraid?No   Does patient need assistance? Functional Status Self care Ambulation Normal Comments no missed doses of HIV rxes   Physical Exam  General:  alert and well-nourished.  he looks uncomfortable. Head:  normocephalic and no abnormalities observed.   Eyes:  vision grossly intact, pupils equal, pupils round, pupils reactive to light, and no injection.   Mouth:  pharynx pink and moist, no erythema, no exudates, and fair dentition.   Lungs:  normal breath sounds.  no crackles and no wheezes.   Heart:  normal rate, regular rhythm, and no murmur.   Neurologic:  alert & oriented X3 and cranial nerves II-XII intact.  no ptosis or facial droop.        Medication Adherence: 05/01/2010   Adherence to medications  reviewed with patient. Counseling to provide adequate adherence provided                                Impression & Recommendations:  Problem # 1:  HIV DISEASE (ICD-042) His HIV viral load remains undetectable.  I will not make any changes today. His updated medication list for this problem includes:    Valtrex 500 Mg Tabs (Valacyclovir hcl) .Marland Kitchen... 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  I will start a trial of verapamil to see if that can help prevent frequent severe headaches. Orders: Est. Patient Level IV ([address])  Problem # 3:  HYPERTENSION NEC (ICD-997.91) His blood pressure remains moderately elevated.  Verapamil is in today for his headaches.  I will see him back in one month to recheck his blood pressure. Orders: Est. Patient Level IV (60454)  Medications Added to Medication List This Visit: 1)  Verapamil Hcl 120 Mg Tabs (Verapamil hcl) .... Take 1 tablet by mouth three times a day  Patient Instructions: 1)  Please schedule a follow-up appointment in 1 month.   Prescriptions: VERAPAMIL HCL 120 MG TABS (VERAPAMIL HCL) Take 1 tablet by mouth three times a day  #90 x 3   Entered and Authorized by:   Cliffton Asters MD   Signed by:   Cliffton Asters MD on 05/01/2010   Method used:   Print then Give to Patient   RxID:   [id number]

## 2010-09-25 NOTE — Assessment & Plan Note (Signed)
Summary: F/U OV/VS   Vital Signs:  Patient profile:   55 year old male Height:      64 inches (162.56 cm) Weight:      137.25 pounds (62.39 kg) BMI:     23.64 Temp:     98.0 degrees F (36.67 degrees C) oral Pulse rate:   62 / minute BP sitting:   132 / 81  (right arm) Cuff size:   regular  Vitals Entered By: Jennet Maduro RN (September 19, 2010 3:47 PM) CC: follow-up visit Is Patient Diabetic? No Pain Assessment Patient in pain? no      Nutritional Status BMI of 19 -24 = normal Nutritional Status Detail appetite "great"  Have you ever been in a relationship where you felt threatened, hurt or afraid?No   Does patient need assistance? Functional Status Self care Ambulation Normal Comments no missed doses   CC:  follow-up visit.  History of Present Illness: Daniel Gonzalez is in for his routine visit.  He is worried about bleeding his insurance deductible and being able to meet his co-pays for his medications but so far he has not had to go without any of his medication and has not missed any doses.  He was seen by the neurologist recently for his cluster headaches.  The neurologist prescribed Maxalt but Rodolph says his headaches resolved about 4 years never filled it.  He has not needed to take any more of the verapamil or tramadol.  He continues to smoke cigarettes and says that he is not thought about quitting.  Preventive Screening-Counseling & Management  Alcohol-Tobacco     Alcohol drinks/day: <1     Alcohol type: beer     Smoking Status: current     Smoking Cessation Counseling: yes     Smoke Cessation Stage: precontemplative     Packs/Day: 0.5     Cans of tobacco/week: no     Passive Smoke Exposure: yes  Caffeine-Diet-Exercise     Caffeine use/day: yes     Does Patient Exercise: no     Type of exercise: working two jobs     Times/week: 7  Hep-HIV-STD-Contraception     HIV Risk: no risk noted     HIV Risk Counseling: not indicated-no HIV risk  noted  Safety-Violence-Falls     Seat Belt Use: yes  Comments: declined condoms      Sexual History:  n/a.        Drug Use:  current, marijuana, and occassionally.    Current Medications (verified): 1)  Combivir 150-300 Mg Tabs (Lamivudine-Zidovudine) .... Take 1 Tablet By Mouth Two Times A Day 2)  Viread 300 Mg Tabs (Tenofovir Disoproxil Fumarate) .... Take 1 Tablet By Mouth Once A Day 3)  Kaletra 200-50 Mg Tabs (Lopinavir-Ritonavir) .... Take 2 Tablets By Mouth Twice A Day 4)  Valtrex 500 Mg Tabs (Valacyclovir Hcl) .... Take 1 Tablet By Mouth Two Times A Day As Neede 5)  Viagra 100 Mg Tabs (Sildenafil Citrate) .... Take As Directed 6)  Verapamil Hcl 120 Mg Tabs (Verapamil Hcl) .... Take 1 Tablet By Mouth Three Times A Day As Needed For Headaches 7)  Tramadol Hcl 50 Mg Tabs (Tramadol Hcl) .... Take 1 Tablet By Mouth Every 6 Hours As Needed For Headache.  Take Tablet As Soon As Headache Begins.  Allergies: No Known Drug Allergies  Social History: Drug Use:  current, marijuana, occassionally  Physical Exam  General:  alert and well-nourished.   Ears:  there is a smal  cyst behind his L ear; a waxy core was easily expressible Mouth:  pharynx pink and moist, no erythema, no exudates, and fair dentition.   Lungs:  normal breath sounds.  no crackles and no wheezes.   Heart:  normal rate, regular rhythm, and no murmur.   Skin:  no rashes.   Axillary Nodes:  no R axillary adenopathy and no L axillary adenopathy.   Psych:  normally interactive, good eye contact, not anxious appearing, and not depressed appearing.          Medication Adherence: 09/19/2010   Adherence to medications reviewed with patient. Counseling to provide adequate adherence provided   Prevention For Positives: 09/19/2010   Safe sex practices discussed with patient. Condoms offered.                             Impression & Recommendations:  Problem # 1:  HIV DISEASE (ICD-042) his adherence is very  good and his infection remains under excellent control.  I will continue his current regimen.  He knows to call us if he thinks that he may not be able to meet his co-pays. His updated medication list for this problem includes:    Valtrex 500 Mg Tabs (Valacyclovir hcl) .Marland Kitchen... Take 1 tablet by mouth two times a day as neede  Diagnostics Reviewed:  CD4: 820 (08/23/2010)   WBC: 5.5 (08/22/2010)   Hgb: 12.0 (08/22/2010)   HCT: 34.4 (08/22/2010)   Platelets: 262 (08/22/2010) HIV genotype: See Comment (02/21/2008)   HIV-1 RNA: <20 copies/mL (08/23/2010)   HBSAg: No (10/12/2006)  Problem # 2:  HYPERTENSION NEC (ICD-997.91) His blood pressure is much better today.  I will not start antihypertensives. Orders: Est. 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 Orders: Est. Patient Level IV (09811)  Problem # 4:  CIGARETTE SMOKER (ICD-305.1) I have strongly encouraged him to consider quitting cigarettes. Orders: Est. Patient Level IV (91478)  Medications Added to Medication List This Visit: 1)  Verapamil Hcl 120 Mg Tabs (Verapamil hcl) .... Take 1 tablet by mouth three times a day as needed for headaches  Other Orders: Future Orders: T-CD4SP ([facility]) (CD4SP) ... 03/18/2011 T-HIV Viral Load [phone]) ... 03/18/2011  Patient Instructions: 1)  Please schedule a follow-up appointment in 6 months.    Orders Added: 1)  T-CD4SP ([facility]) [CD4SP] 2)  T-HIV Viral Load [phone] 3)  Est. Patient Level IV [28413]   Immunization History:  Influenza Immunization History:    Influenza:  fluvax non-mcr (04/23/2010)   Immunization History:  Influenza Immunization History:    Influenza:  Fluvax Non-MCR (04/23/2010)

## 2010-10-24 ENCOUNTER — Telehealth: Payer: Self-pay | Admitting: Internal Medicine

## 2010-10-24 ENCOUNTER — Encounter (INDEPENDENT_AMBULATORY_CARE_PROVIDER_SITE_OTHER): Payer: Self-pay | Admitting: *Deleted

## 2010-10-25 ENCOUNTER — Telehealth (INDEPENDENT_AMBULATORY_CARE_PROVIDER_SITE_OTHER): Payer: Self-pay | Admitting: *Deleted

## 2010-10-29 LAB — CBC
HCT: 35.3 % — ABNORMAL LOW (ref 39.0–52.0)
Hemoglobin: 12 g/dL — ABNORMAL LOW (ref 13.0–17.0)
MCH: 39.5 pg — ABNORMAL HIGH (ref 26.0–34.0)
MCHC: 34 g/dL (ref 30.0–36.0)
MCV: 116.1 fL — ABNORMAL HIGH (ref 78.0–100.0)
Platelets: 195 10*3/uL (ref 150–400)
RBC: 3.04 MIL/uL — ABNORMAL LOW (ref 4.22–5.81)
RDW: 15.1 % (ref 11.5–15.5)
WBC: 3.3 10*3/uL — ABNORMAL LOW (ref 4.0–10.5)

## 2010-10-29 LAB — COMPREHENSIVE METABOLIC PANEL
ALT: 11 U/L (ref 0–53)
AST: 15 U/L (ref 0–37)
Albumin: 3.7 g/dL (ref 3.5–5.2)
Alkaline Phosphatase: 60 U/L (ref 39–117)
BUN: 12 mg/dL (ref 6–23)
CO2: 24 mEq/L (ref 19–32)
Calcium: 8.7 mg/dL (ref 8.4–10.5)
Chloride: 108 mEq/L (ref 96–112)
Creatinine, Ser: 1.17 mg/dL (ref 0.4–1.5)
GFR calc Af Amer: >60 mL/min (ref >60)
GFR calc non Af Amer: >60 mL/min (ref >60)
Glucose, Bld: 112 mg/dL — ABNORMAL HIGH (ref 70–99)
Potassium: 4.1 mEq/L (ref 3.5–5.1)
Sodium: 139 mEq/L (ref 135–145)
Total Bilirubin: 0.6 mg/dL (ref 0.3–1.2)
Total Protein: 6.8 g/dL (ref 6.0–8.3)

## 2010-10-29 LAB — DIFFERENTIAL
Basophils Absolute: 0 10*3/uL (ref 0.0–0.1)
Basophils Relative: 1 % (ref 0–1)
Eosinophils Absolute: 0.1 10*3/uL (ref 0.0–0.7)
Eosinophils Relative: 2 % (ref 0–5)
Lymphocytes Relative: 46 % (ref 12–46)
Lymphs Abs: 1.5 10*3/uL (ref 0.7–4.0)
Monocytes Absolute: 0.3 10*3/uL (ref 0.1–1.0)
Monocytes Relative: 9 % (ref 3–12)
Neutro Abs: 1.4 10*3/uL — ABNORMAL LOW (ref 1.7–7.7)
Neutrophils Relative %: 42 % — ABNORMAL LOW (ref 43–77)

## 2010-10-29 LAB — BODY FLUID CULTURE

## 2010-10-29 LAB — SURGICAL PCR SCREEN
MRSA, PCR: NEGATIVE
Staphylococcus aureus: NEGATIVE

## 2010-10-29 NOTE — Progress Notes (Signed)
Summary: pt. given samples  Phone Note Outgoing Call   Call placed by: Annice Pih Summary of Call: Pt. given samples of Viread, Kaletra, and Combivir, Arlana Hove is working on pt. assistance for these meds Initial call taken by: Wendall Mola CMA Duncan Dull),  October 25, 2010 3:17 PM

## 2010-10-29 NOTE — Miscellaneous (Signed)
Summary: PRESCRIPTION ASSISTANCE  Clinical Lists Changes  MR. Codispoti CALLED REQUESTING ASSISTANCE WITH COMBIVIR, VIREAD AND KALETRA.  SINCE HE HAS PRESCRIPTION DRUG COVERAGE, THERE ARE 2 PROGRAMS THAT HE QUALIFIES TO APPLY TO.  [name] FOR VIREAD AND [name] FOR KALETRA.  HE WILL BE COMING INTO THE OFFICE ON 3/9 AT 3:00 TO COMPLETE THE APPLICATIONS.

## 2010-10-29 NOTE — Progress Notes (Signed)
  Phone Note Call from Patient   Caller: Patient Summary of Call: pt called concerned that he only has one wk worth of HIV meds left. he has insurance but he has to meet a $5700 deductible before they will pay anything. I gave him Barbara's number to see if he qualifies for something. his # are [phone] & [phone].Golden Circle RN  October 24, 2010 8:46 AM  Initial call taken by: Golden Circle RN,  October 24, 2010 8:44 AM

## 2010-10-30 ENCOUNTER — Encounter (INDEPENDENT_AMBULATORY_CARE_PROVIDER_SITE_OTHER): Payer: Self-pay | Admitting: *Deleted

## 2010-11-04 ENCOUNTER — Encounter (INDEPENDENT_AMBULATORY_CARE_PROVIDER_SITE_OTHER): Payer: Self-pay | Admitting: *Deleted

## 2010-11-04 LAB — T-HELPER CELL (CD4) - (RCID CLINIC ONLY)
CD4 % Helper T Cell: 29 % — ABNORMAL LOW (ref 33–55)
CD4 T Cell Abs: 880 uL (ref 400–2700)

## 2010-11-05 ENCOUNTER — Telehealth: Payer: Self-pay | Admitting: *Deleted

## 2010-11-05 NOTE — Telephone Encounter (Signed)
Daniel Gonzalez with [name], called to let us know that his PAP was denied because he has insurance. If he wants to appeal, he can call the [phone] & they will walk him thru how to appeal this. I called pt & he does want to appeal. He did not want to discuss with me & asked that Daniel Gonzalez call him when she returns tomorrow.  According to Daniel Gonzalez he needs to bring in & have faxed to her [phone])   statement of how much he spent on meds last year-his out of pocket How much is [name] co pay Proof of income Copies of the front & back of insurance card. She stated this process may take 3 or more weeks

## 2010-11-05 NOTE — Miscellaneous (Signed)
Summary: PRESCRIPTION ASSISTANCE  Clinical Lists Changes  THE APPLICATIONS FOR [name]/KALETRA AND NORVIR, [employer]/[facility]ILREAD WERE MAILED.

## 2010-11-06 ENCOUNTER — Telehealth: Payer: Self-pay | Admitting: Internal Medicine

## 2010-11-06 ENCOUNTER — Encounter: Payer: Self-pay | Admitting: Internal Medicine

## 2010-11-06 NOTE — Telephone Encounter (Signed)
I drafted a letter to Daniel Gonzalez to let him know that we need to file appeals with [name] and [name] and what he needs to file the appeal.  He will contact me to arrange a time to come in and complete the process.

## 2010-11-06 NOTE — Telephone Encounter (Signed)
Daniel Gonzalez is handling this with the pt.Daniel Gonzalez

## 2010-11-08 NOTE — Progress Notes (Signed)
This encounter was created in error - please disregard.

## 2010-11-11 ENCOUNTER — Telehealth: Payer: Self-pay | Admitting: Internal Medicine

## 2010-11-11 ENCOUNTER — Encounter: Payer: Self-pay | Admitting: Internal Medicine

## 2010-11-11 NOTE — Telephone Encounter (Signed)
[name] left a message regarding the information he needs to send to file the appeal to [place] and [name].  He has the information and wanted to know if he could fax it.  I told him that he could and gave him the fax #.  He asked that I call him back if I don't receive it by the end of the day.Marland KitchenMarland KitchenAntionette Gonzalez

## 2010-11-11 NOTE — Telephone Encounter (Signed)
Appeals letters were mailed to [name] and [place] Patient Assistance along with Proof of Income, copies of [employer], Proof of out of pocket and co-pay costs and a list of monthly expenses.Daniel Gonzalez

## 2010-11-14 NOTE — Miscellaneous (Signed)
Summary: [name] - PAP  Clinical Lists Changes  APP FOR KALETRA HAS NOT BEEN RECEIVED.  I WILL REFAX IT.

## 2010-11-14 NOTE — Miscellaneous (Signed)
Summary: Daniel Gonzalez  Clinical Lists Changes  CONTACT [employer] TO CHECK STATUS OF APP FOR VIREAD, NOT PROCESSED YET, NOT SURE THAT IT HAS BEEN RECEIVED YET.

## 2010-11-25 ENCOUNTER — Telehealth: Payer: Self-pay | Admitting: *Deleted

## 2010-11-25 NOTE — Telephone Encounter (Signed)
I called [name]  to see where the appeals process was for his Kaletra [phone]. He was approved 11/21/10. Good for 1 year. It will be here by Thursday Called [employer]. [phone]. They were missing the household size. He lives alone. Now that she has this she will send it to the company for their review. She did not know how long this will take. Notified pt

## 2010-11-26 LAB — T-HELPER CELL (CD4) - (RCID CLINIC ONLY)
CD4 % Helper T Cell: 29 % — ABNORMAL LOW (ref 33–55)
CD4 T Cell Abs: 840 uL (ref 400–2700)

## 2010-11-27 ENCOUNTER — Telehealth: Payer: Self-pay | Admitting: Internal Medicine

## 2010-11-27 DIAGNOSIS — B20 Human immunodeficiency virus [HIV] disease: Secondary | ICD-10-CM

## 2010-11-27 MED ORDER — LOPINAVIR-RITONAVIR 200-50 MG PO TABS: 2.0000 | ORAL_TABLET | Freq: Two times a day (BID) | ORAL | Status: DC

## 2010-11-27 NOTE — Telephone Encounter (Signed)
Jennet Maduro, RN gave me the # for the ViiV rep to hopefully help Mr. Agner with his Combivir.  I have left her a message to call me back to see what we can do.Marland KitchenMarland KitchenAntionette Fairy

## 2010-11-27 NOTE — Telephone Encounter (Signed)
The decision for the appeal has come in and Daniel Gonzalez was denied.  He can still use the co-pay card for Viread with his insurance which should help some.

## 2010-11-27 NOTE — Telephone Encounter (Signed)
The decision for the appeal has come in and Daniel Gonzalez was denied.  He can still use the Co-Pay card with his insurance which should help considerably.

## 2010-11-27 NOTE — Telephone Encounter (Signed)
Jennet Maduro, RN stated that [name] has come in and she has contacted him to let him know.  She mentioned that the [name] Rep stated that if a patient is approved for PAP, they can get other medications through them as well.  We have not been able to locate a program that he qualifes for that will cover his Combivir so I called [name] to see if he could get that medication through them.  But, they said that is not a medication they offer.  I let Angelique Blonder know.[name]

## 2010-11-27 NOTE — Telephone Encounter (Signed)
[employer] Patient Assistance Program called with the determination in the appeal for Viread for Daniel Gonzalez.  He has been denied.  I attempted to apply for assistance through Patient [employer], but he did not qualify for that either.  I am awaiting a call back from [name] with [employer] [phone] on Combivir and will talk to her about Viread as well.

## 2010-11-27 NOTE — Telephone Encounter (Signed)
A call was placed to [facility] to check on status of appeal for Viread for Mr. Coutts.  The appeal is still being reviewed, but once the decision has come in, we will be contacted.Marland KitchenMarland KitchenAntionette Fairy

## 2010-11-28 LAB — CULTURE, ROUTINE-ABSCESS

## 2010-12-02 ENCOUNTER — Telehealth: Payer: Self-pay | Admitting: Internal Medicine

## 2010-12-02 ENCOUNTER — Other Ambulatory Visit: Payer: Self-pay | Admitting: *Deleted

## 2010-12-02 DIAGNOSIS — B20 Human immunodeficiency virus [HIV] disease: Secondary | ICD-10-CM

## 2010-12-02 MED ORDER — TENOFOVIR DISOPROXIL FUMARATE 300 MG PO TABS: 300.0000 mg | ORAL_TABLET | Freq: Every day | ORAL | Status: DC

## 2010-12-02 MED ORDER — LAMIVUDINE-ZIDOVUDINE 150-300 MG PO TABS: 1.0000 | ORAL_TABLET | Freq: Two times a day (BID) | ORAL | Status: DC

## 2010-12-02 NOTE — Telephone Encounter (Signed)
Called Mr. Rahm to let him know that his Kaletra was in and that there are samples for Combivir and Viread with it.  Also, I spoke with Jethro Bolus and Naren with [employer] to see if there was any assistance they could give.  They can set up an account that he can pay on, but his medications have to be filed through [employer] in order for filing with insurance.  Mr. Lambertson will need to contact them if that is what he wants to do.  He is aware.[name]

## 2010-12-02 NOTE — Telephone Encounter (Signed)
Through the [employer] site, I found Patient Access Network that assists with co-pays for specific DX.  There is a program that might be able to assist Daniel Gonzalez with Viread.  He is going to get his W@ faxed to me so that I can complete the online application.  He asked if we have samples for Viread and Combivir because he is almost out.  I said I would check with Angelique Blonder.[name]

## 2010-12-02 NOTE — Telephone Encounter (Signed)
Received faxed W2 from Mr. Holzheimer and completed the online application for Patient Access Network that might have assisted with Co-Pay Assist for Viread, but he made too much money.[name]

## 2010-12-03 LAB — T-HELPER CELL (CD4) - (RCID CLINIC ONLY)
CD4 % Helper T Cell: 25 % — ABNORMAL LOW (ref 33–55)
CD4 T Cell Abs: 690 uL (ref 400–2700)

## 2011-01-10 NOTE — Telephone Encounter (Signed)
Daniel Gonzalez called on 5/23 because he is still having trouble getting his Combivir and Viread.  I spoke with Daniel Gonzalez and Daniel Gonzalez with [facility] for suggestions.  Daniel Gonzalez was able to get him samples to hold him over and it is possible that we will have to refer him to [facility] to possibly help him with some expenses to free that money up for him to be able to buy his prescriptions.  I also went back into [employer] and [employer] has added these 2 medications to their list so we are working on getting that going.  As long as the insurance doesn't end up being an issue.  Daniel Gonzalez is aware.[name]

## 2011-01-15 ENCOUNTER — Other Ambulatory Visit: Payer: Self-pay | Admitting: *Deleted

## 2011-01-15 NOTE — Telephone Encounter (Signed)
Mr. Tith came to me for assistance with Kaletra, Combivir and Viread.  We applied to several Prescription Assistance Programs and Mr. Iddings was denied due to his income and his insurance.  We filed appeals due to the deductible Mr. Walck has being so high he cannot afford to pay for the medications until that deductible is met due to the cost of all 3 medications.  [name] approved him for the Kaletra, but we still have not been able to find any companies to help with Combivir and Viread.  We have been able to help Mr. Hartline with samples, but that isn't something that can be continuously offered.  I spoke with Tomasita Morrow, RN who suggested I speak with [facility] who suggested a possible referral to [facility] to see if there is any services they can provide to free up the money so that he can use it to pay for the medications and go towards his deductible too.  Another option was to possibly change his treatment regime.  At this point, I don't know any other options.

## 2011-01-23 NOTE — Telephone Encounter (Signed)
Called Mr. Enderson to let him know that I have forwarded everything to Dr. [name] to see what he suggests.  I was not able to leave a message due to his mailbox being full.[name]

## 2011-01-30 ENCOUNTER — Telehealth: Payer: Self-pay | Admitting: Internal Medicine

## 2011-01-30 NOTE — Telephone Encounter (Signed)
Since getting 2 of [name] medications has proven to be a problem, I have forwarded that information on to Dr. [name] and Jennet Maduro, RN and Golden Circle, RN are aware as well.  Angelique Blonder suggested we make an appt with [name] for him to discuss with Dr. [name].  I have called [name] and explained all of this in a message and asked him to call back to make an appt.

## 2011-01-30 NOTE — Telephone Encounter (Signed)
Mr. Mitcham called and made his appt. For 6/19 at 4:15 to talk to Dr. [name] about the problem with his medications.  He has been able to get Kaletra for free through [name], but is not able to get Viread or Combivir because even after filing an appeal with [employer] for Viread, he was denied and there isn't a program for Combivir that he qualifies for due to having insurance.[name]

## 2011-02-03 ENCOUNTER — Telehealth: Payer: Self-pay | Admitting: Internal Medicine

## 2011-02-03 NOTE — Telephone Encounter (Signed)
Scheduled [name] to see Dr. [name] regarding all of the problems with getting his medications.[name]

## 2011-02-04 ENCOUNTER — Ambulatory Visit (INDEPENDENT_AMBULATORY_CARE_PROVIDER_SITE_OTHER): Payer: 59 | Admitting: Internal Medicine

## 2011-02-04 ENCOUNTER — Encounter: Payer: Self-pay | Admitting: Internal Medicine

## 2011-02-04 VITALS — BP 131/86 | HR 63 | Temp 98.0°F | Ht 64.0 in | Wt 131.2 lb

## 2011-02-04 DIAGNOSIS — B20 Human immunodeficiency virus [HIV] disease: Secondary | ICD-10-CM

## 2011-02-04 DIAGNOSIS — G44019 Episodic cluster headache, not intractable: Secondary | ICD-10-CM

## 2011-02-04 DIAGNOSIS — IMO0002 Reserved for concepts with insufficient information to code with codable children: Secondary | ICD-10-CM

## 2011-02-04 NOTE — Telephone Encounter (Signed)
Difficulty with obtaining HIV rxes.

## 2011-02-04 NOTE — Assessment & Plan Note (Signed)
His blood pressures have been only mildly and intermittently elevated. I talked to him about a low-salt diet and other dietary modifications he might make to help control his blood pressure. Given the difficulty of affording his other medications I will not start any antihypertensive therapy at this time.

## 2011-02-04 NOTE — Assessment & Plan Note (Signed)
His infection has been under very good control on his current regimen. In January his CD4 count was over 800 and his viral load remains undetectable at less than 20. I will repeat the blood work today. I reviewed his past regimens and genotypes and we'll see if he qualifies for assistance for Atripla plus Retrovir.

## 2011-02-04 NOTE — Progress Notes (Signed)
  Subjective:    Patient ID: Daniel Gonzalez, male    DOB: 01/27/1956, 55 y.o.   MRN: [id number]  HPI Daniel Gonzalez is in for his routine visit. He denies any problems other than having difficulty affording his co-pays for his medications. Apparently he makes a just enough money that he does not qualify for pharmaceutical assistance program for the current medications he is on. He says he has about one more week of his current medicines before he runs out.  He has not had any further problems with his headaches and has not needed to take the verapamil or tramadol.    Review of Systems     Objective:   Physical Exam  Constitutional: He appears well-developed and well-nourished. No distress.  HENT:  Mouth/Throat: Oropharynx is clear and moist. No oropharyngeal exudate.  Cardiovascular: Normal rate, regular rhythm and normal heart sounds.   No murmur heard. Pulmonary/Chest: Breath sounds normal. He has no wheezes. He has no rales.  Psychiatric: He has a normal mood and affect.          Assessment & Plan:

## 2011-02-04 NOTE — Assessment & Plan Note (Signed)
His cluster headaches have spontaneously gone into remission and he is not requiring any treatment.

## 2011-02-06 LAB — HIV-1 RNA QUANT-NO REFLEX-BLD: HIV-1 RNA Quant, Log: 1.75 {Log} — ABNORMAL HIGH (ref <1.30)

## 2011-02-06 LAB — T-HELPER CELL (CD4) - (RCID CLINIC ONLY)
CD4 % Helper T Cell: 31 % — ABNORMAL LOW (ref 33–55)
CD4 T Cell Abs: 1060 uL (ref 400–2700)

## 2011-02-11 ENCOUNTER — Telehealth: Payer: Self-pay | Admitting: Internal Medicine

## 2011-02-11 NOTE — Telephone Encounter (Signed)
I called and left a message this morning for Daniel Gonzalez regarding recent paystubs that the PAP company is requesting.  I also have another application that I need him to sign.

## 2011-02-11 NOTE — Telephone Encounter (Signed)
Mr. Stick called back regarding the message I left.  He is going to try and get by the office this evening to sign the new application and bring a pay stub.  He may need to work overtime though.  He is off on Fridays but would like to try to get into the office before then.

## 2011-02-11 NOTE — Telephone Encounter (Signed)
Daniel Gonzalez came and brought the updated pay stub and signed the application for ViiV.  The application for Atripla was refaxed and once the prescription for ViiV is signed by the provider, it will be sent.

## 2011-02-12 ENCOUNTER — Telehealth: Payer: Self-pay | Admitting: Internal Medicine

## 2011-02-12 ENCOUNTER — Other Ambulatory Visit: Payer: Self-pay | Admitting: *Deleted

## 2011-02-12 DIAGNOSIS — B20 Human immunodeficiency virus [HIV] disease: Secondary | ICD-10-CM

## 2011-02-12 MED ORDER — ZIDOVUDINE 300 MG PO TABS: 300.0000 mg | ORAL_TABLET | Freq: Two times a day (BID) | ORAL | Status: DC

## 2011-02-12 NOTE — Telephone Encounter (Signed)
Application for Zidovudine faxed to [employer].[name]

## 2011-02-12 NOTE — Telephone Encounter (Signed)
When Mr. Mangieri was here, he said that he had about a week of medication left.  I reminded him that the PAP processing can take a while and that I would ask if there were samples.  Jennet Maduro, RN said that there may be some samples of Atripla, but definetly not the [name].  I asked Mr. Xiang to call us when he is closer to being out of the medication and if there are samples, we will try to assist him.  I also let him know that since we definetly do not have the [name], it may be that he would need to pay out of pocket for that if we did have samples of Atripla.  In investigating alternative Financial Assistance, I found that the mail order through [facility] offers [name] 300mg  #60 for $161.99.  If ViiV does not come through for him, but Atripla does, this may be an option.[name]

## 2011-02-17 ENCOUNTER — Telehealth: Payer: Self-pay | Admitting: Internal Medicine

## 2011-02-17 DIAGNOSIS — B20 Human immunodeficiency virus [HIV] disease: Secondary | ICD-10-CM

## 2011-02-17 MED ORDER — EFAVIRENZ-EMTRICITAB-TENOFOVIR 600-200-300 MG PO TABS: 1.0000 | ORAL_TABLET | Freq: Every day | ORAL | Status: DC

## 2011-02-17 NOTE — Telephone Encounter (Signed)
Called to check on status of application for Atripla.  Was told that the application was denied because the patient isn't taking Atripla.  In an attempt to get Daniel Gonzalez on medications that he can either afford or that he can get assistance with through Prescription Assistance, Dr. [name] has prescribed Atripla.  I will inquire as to why it is not in his medication list and get that corrected.[name]

## 2011-02-17 NOTE — Telephone Encounter (Signed)
Called Daniel Gonzalez to let him know that the Atripla application is still in process and that he was denied for the ViiV program for Retrovir.  I left a message and mentioned the My Support Card and the price that was quoted through [employer] order and asked for him to call me back to discuss further.

## 2011-02-17 NOTE — Telephone Encounter (Signed)
Called [employer] to check on status of application for Retrovir for Daniel Gonzalez.  He was denied due to his income being high and having insurance.  I was advised by [facility] - [phone], that he may qualify to use the [employer], Your Rx Card [phone] or Free Drug Card [phone].  We are still waiting on the status of the Atripla application.[name]

## 2011-02-17 NOTE — Telephone Encounter (Signed)
Daniel Gonzalez had returned my call so I called him back to explain that Atripla app was still in process and ViiV was denied.  I let him know that the Retrovir isn't too expensive and with the $100 discount card that he can use even with his insurance would cut that price a lot too.  I also said that I would check the status of Atripla app again tomorrow.

## 2011-02-18 NOTE — Telephone Encounter (Signed)
Called Atripla Patient Assistance to check status of application that we refaxed yesterday.  Due to his income, he does not qualify and I was told the application will be denied.  I inquired about filing an appeal and was told to write the appeal letter stating that the reason was due to income and insurance and the extenuating circumstance pertaining to his medication.  I will type that and have Angelique Blonder review and have Dr. [name] sign it then I will fax it.

## 2011-02-20 NOTE — Telephone Encounter (Signed)
rec'd fax from [employer] asking for appeal letter & list of monthly expenses. This was sent 02/17/11. I faxed it to them again

## 2011-02-24 ENCOUNTER — Telehealth: Payer: Self-pay | Admitting: *Deleted

## 2011-02-24 DIAGNOSIS — B20 Human immunodeficiency virus [HIV] disease: Secondary | ICD-10-CM

## 2011-02-24 MED ORDER — EFAVIRENZ-EMTRICITAB-TENOFOVIR 600-200-300 MG PO TABS: 1.0000 | ORAL_TABLET | Freq: Every day | ORAL | Status: DC

## 2011-02-24 NOTE — Telephone Encounter (Signed)
Called Mr. Tugwell to let him know about samples of Atripla and the coupon for [name] is in the bag.  His appeal for Atripla is still in process.  His cell # has been disconnected and his home voicemail box has not been set up.

## 2011-02-24 NOTE — Telephone Encounter (Signed)
Called to check the status of the appeal to Atripla MAP, the appeal is still in process.

## 2011-02-24 NOTE — Telephone Encounter (Signed)
Regimen changed in order to make medications more available for the pt.  PAP for Atripla still pending.  Pt needing HIV rxes.  Pt advised that 98-month supply of Atripla available from the Center.  To come to pick it up and start Retrovir at the same time.  Given coupon for the Retrovir.  Jennet Maduro, RN.

## 2011-02-24 NOTE — Telephone Encounter (Signed)
Still has $2800 deductible to meet.  Ref # [id number].  Determination of appeal has not come through.

## 2011-03-05 NOTE — Telephone Encounter (Signed)
Called to check on status of appeal for Atripla, the appeal has not been sent through for approval.  They needed to know if the patient was able to afford the copay using the copay card and I said that he was not able.  Appeal is being submitted for review and can take up to a week.

## 2011-03-07 NOTE — Telephone Encounter (Signed)
Atripla Pt Assistance Program representative, V called [place] [phone]). States they verified his deductible at $2850, lifetime max 5800 and $35 co-pay. This is not what was in the appeal letter. He had told us the deductible was 5000. She will send the letter with the new numbers on it to the decision makers.she will call us when she has an answer.

## 2011-03-12 NOTE — Telephone Encounter (Signed)
I received a fax from [employer] stating that the appeal was denied.  I investigated what the cost of the medications would be after the deductible is met and it will still cost about $1500 per month or more depending on the series of medications he takes.  I contacted Daniel Gonzalez to let him know and asked if there have been any changes that I can put with that information in another appeal letter.  He said that his son is now living with him.  I have written the appeal letter and will put it in Dr. Lovina Reach box to sign.  Daniel Gonzalez is aware that there are samples of the Atripla waiting for him to pick up.  He said he will come on Friday, I instructed him to ask for [place].

## 2011-03-17 ENCOUNTER — Other Ambulatory Visit: Payer: Self-pay | Admitting: *Deleted

## 2011-03-17 DIAGNOSIS — B20 Human immunodeficiency virus [HIV] disease: Secondary | ICD-10-CM

## 2011-03-17 MED ORDER — ZIDOVUDINE 300 MG PO TABS: 300.0000 mg | ORAL_TABLET | Freq: Two times a day (BID) | ORAL | Status: DC

## 2011-03-18 NOTE — Telephone Encounter (Signed)
Faxed & mailed the second appeal letter

## 2011-03-26 NOTE — Telephone Encounter (Signed)
Calling to check on the status of the 2nd appeal.  It was denied as well due to his insurance.  It was thought by the Review Committee that once his deductible was met, his co-pay would be low and affordable.  In this telephone call, I stated that $1400 a month is not a low and affordable co-pay.  The Representative then asked me to hold so she could discuss it further with the person who did the review.  The decision was still a denial.

## 2011-03-31 ENCOUNTER — Telehealth: Payer: Self-pay | Admitting: Licensed Clinical Social Worker

## 2011-03-31 NOTE — Telephone Encounter (Signed)
Patient started taking atripla and zidovudine last night and felt dizzy and strange, I advised him that Atripla can have that affect and to make sure he takes it at bedtime. He agreed to the plan and will try this for the next week.

## 2011-04-07 ENCOUNTER — Telehealth: Payer: Self-pay | Admitting: *Deleted

## 2011-04-07 NOTE — Telephone Encounter (Signed)
C/o problems with the side effects of the "pill I take at night". He thought it was [place].  It is atripla. Tol;d him I will send this to his md & call him with md response. His # is [phone]

## 2011-04-07 NOTE — Telephone Encounter (Signed)
[place] he started Atripla one week ago and has had some difficulty sleeping due to being very restless at night and also thinks that his caused him to have some mild, intermittent shortness of breath. The symptoms do seem to be getting a little bit better. I encouraged him to continue taking it for at least the next week to see if the symptoms abated so that he can continue taking it. If the symptoms continue and he cannot tolerate them then I will change him to a different regimen.

## 2011-04-09 ENCOUNTER — Telehealth: Payer: Self-pay | Admitting: *Deleted

## 2011-04-09 NOTE — Telephone Encounter (Signed)
Question placed to Dr. [name] about possibility of changing pt to Complera due to an inability to obtain pharmaceutical funding for Atripla.  Dr. [name] responded that the pt could be switched to Complera + [name].  Jennet Maduro, RN.

## 2011-04-15 ENCOUNTER — Telehealth: Payer: Self-pay | Admitting: *Deleted

## 2011-04-15 DIAGNOSIS — B20 Human immunodeficiency virus [HIV] disease: Secondary | ICD-10-CM

## 2011-04-15 MED ORDER — EMTRICITAB-RILPIVIR-TENOFOV DF 200-25-300 MG PO TABS: 1.0000 | ORAL_TABLET | Freq: Once | ORAL | Status: DC

## 2011-04-15 NOTE — Telephone Encounter (Signed)
Fax received.  Pt approved for Complera PAP.  Pt notified that he should start Complera after finishing current supply of Atripla.  Pt. Verbalized back understanding.  Jennet Maduro, RN

## 2011-04-23 ENCOUNTER — Telehealth: Payer: Self-pay | Admitting: *Deleted

## 2011-04-23 NOTE — Telephone Encounter (Signed)
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 Told him we do not do that here (per D. Esteridge, RN) since he has insurance he should call them to see what orthopedist is within his plan & make an appt himself. I told him if he runs into any problems, to call us for help. He agreed with plan

## 2011-05-15 LAB — T-HELPER CELL (CD4) - (RCID CLINIC ONLY)
CD4 % Helper T Cell: 21 — ABNORMAL LOW
CD4 T Cell Abs: 800

## 2011-05-17 ENCOUNTER — Inpatient Hospital Stay (INDEPENDENT_AMBULATORY_CARE_PROVIDER_SITE_OTHER)
Admission: RE | Admit: 2011-05-17 | Discharge: 2011-05-17 | Disposition: A | Discharge status: Home / Self Care | Payer: 59 | Source: Ambulatory Visit | Attending: Emergency Medicine | Admitting: Emergency Medicine

## 2011-05-17 DIAGNOSIS — IMO0002 Reserved for concepts with insufficient information to code with codable children: Secondary | ICD-10-CM

## 2011-05-19 ENCOUNTER — Inpatient Hospital Stay (INDEPENDENT_AMBULATORY_CARE_PROVIDER_SITE_OTHER)
Admission: RE | Admit: 2011-05-19 | Discharge: 2011-05-19 | Disposition: A | Discharge status: Home / Self Care | Payer: 59 | Source: Ambulatory Visit | Attending: Emergency Medicine | Admitting: Emergency Medicine

## 2011-05-19 DIAGNOSIS — IMO0002 Reserved for concepts with insufficient information to code with codable children: Secondary | ICD-10-CM

## 2011-05-19 LAB — T-HELPER CELL (CD4) - (RCID CLINIC ONLY): CD4 % Helper T Cell: 24 — ABNORMAL LOW

## 2011-05-20 LAB — CULTURE, ROUTINE-ABSCESS: Culture: NO GROWTH

## 2011-07-02 ENCOUNTER — Other Ambulatory Visit: Payer: Self-pay | Admitting: *Deleted

## 2011-07-02 DIAGNOSIS — B20 Human immunodeficiency virus [HIV] disease: Secondary | ICD-10-CM

## 2011-07-02 MED ORDER — ZIDOVUDINE 300 MG PO TABS: 300.0000 mg | ORAL_TABLET | Freq: Two times a day (BID) | ORAL | Status: DC

## 2011-07-17 ENCOUNTER — Encounter: Payer: Self-pay | Admitting: *Deleted

## 2011-07-29 ENCOUNTER — Telehealth: Payer: Self-pay | Admitting: *Deleted

## 2011-07-29 NOTE — Telephone Encounter (Signed)
Wants to know when he can stop the Atripla. States he has 5 pills left. He is sure this drug is causing his headaches. The med list also has complera. Per notes 04/15/11, he was to start the complera when he finished the Atripla. He does not have any complera & states he never signed a PAP to get it. Thre is an app to [place] for the complera in the file. It is an original, dated 04/09/11 & done by Carolin Guernsey. I called [phone]. Jasmine states they inactivated him since he never used it. She reviewed all info & will reactivate it. The pt has to call above number tomorrow & get his pt ID number. It takes 24 hours.  I called pt back & explained this. He will call them tomorrow & get the id number. I told him to ask them to ship it asap as he will only have  4 pills left.

## 2011-08-04 ENCOUNTER — Telehealth: Payer: Self-pay | Admitting: Internal Medicine

## 2011-08-04 ENCOUNTER — Other Ambulatory Visit: Payer: Self-pay | Admitting: *Deleted

## 2011-08-04 DIAGNOSIS — B20 Human immunodeficiency virus [HIV] disease: Secondary | ICD-10-CM

## 2011-08-04 MED ORDER — EMTRICITAB-RILPIVIR-TENOFOV DF 200-25-300 MG PO TABS: 1.0000 | ORAL_TABLET | Freq: Once | ORAL | Status: DC

## 2011-08-04 NOTE — Telephone Encounter (Signed)
Called [employer] to check the status of Daniel Gonzalez application.  Everything is approved.  The retail card has been sent to him.  May take 2 weeks to receive.  Was given his ID # [id number] or Complera - Group # [id number] and BIN # [id number].  Called patient and gave him the information.  I told him to go to [facility] on [address].  [place] he is going now.

## 2011-09-01 ENCOUNTER — Other Ambulatory Visit: Payer: Self-pay | Admitting: Licensed Clinical Social Worker

## 2011-09-01 DIAGNOSIS — B20 Human immunodeficiency virus [HIV] disease: Secondary | ICD-10-CM

## 2011-09-01 MED ORDER — EMTRICITAB-RILPIVIR-TENOFOV DF 200-25-300 MG PO TABS: 1.0000 | ORAL_TABLET | Freq: Once | ORAL | Status: DC

## 2011-09-03 ENCOUNTER — Other Ambulatory Visit: Payer: Self-pay | Admitting: Internal Medicine

## 2011-09-03 DIAGNOSIS — Z79899 Other long term (current) drug therapy: Secondary | ICD-10-CM

## 2011-09-03 DIAGNOSIS — B20 Human immunodeficiency virus [HIV] disease: Secondary | ICD-10-CM

## 2011-09-03 DIAGNOSIS — Z113 Encounter for screening for infections with a predominantly sexual mode of transmission: Secondary | ICD-10-CM

## 2011-09-08 ENCOUNTER — Other Ambulatory Visit: Payer: 59

## 2011-09-09 ENCOUNTER — Telehealth: Payer: Self-pay | Admitting: *Deleted

## 2011-09-09 NOTE — Telephone Encounter (Signed)
RN spoke with pt.  Daniel Gonzalez, scheduler had already spoken with pt this morning and rescheduled the lab appt for 09/10/11.

## 2011-09-10 ENCOUNTER — Other Ambulatory Visit (INDEPENDENT_AMBULATORY_CARE_PROVIDER_SITE_OTHER): Payer: 59

## 2011-09-10 DIAGNOSIS — Z79899 Other long term (current) drug therapy: Secondary | ICD-10-CM

## 2011-09-10 DIAGNOSIS — B20 Human immunodeficiency virus [HIV] disease: Secondary | ICD-10-CM

## 2011-09-10 DIAGNOSIS — Z113 Encounter for screening for infections with a predominantly sexual mode of transmission: Secondary | ICD-10-CM

## 2011-09-11 LAB — CBC WITH DIFFERENTIAL/PLATELET
Basophils Absolute: 0 10*3/uL (ref 0.0–0.1)
Basophils Relative: 0 % (ref 0–1)
Eosinophils Relative: 2 % (ref 0–5)
HCT: 37.7 % — ABNORMAL LOW (ref 39.0–52.0)
Lymphocytes Relative: 65 % — ABNORMAL HIGH (ref 12–46)
MCHC: 34 g/dL (ref 30.0–36.0)
Monocytes Absolute: 0.4 10*3/uL (ref 0.1–1.0)
Neutro Abs: 1.3 10*3/uL — ABNORMAL LOW (ref 1.7–7.7)
Platelets: 257 10*3/uL (ref 150–400)
RDW: 15.7 % — ABNORMAL HIGH (ref 11.5–15.5)
WBC: 5 10*3/uL (ref 4.0–10.5)

## 2011-09-11 LAB — COMPREHENSIVE METABOLIC PANEL
ALT: 12 U/L (ref 0–53)
AST: 17 U/L (ref 0–37)
Alkaline Phosphatase: 67 U/L (ref 39–117)
BUN: 8 mg/dL (ref 6–23)
Calcium: 9 mg/dL (ref 8.4–10.5)
Chloride: 107 mEq/L (ref 96–112)
Creat: 1.07 mg/dL (ref 0.50–1.35)

## 2011-09-11 LAB — LIPID PANEL
LDL Cholesterol: 86 mg/dL (ref 0–99)
Total CHOL/HDL Ratio: 3.4 Ratio
VLDL: 11 mg/dL (ref 0–40)

## 2011-09-12 LAB — T-HELPER CELL (CD4) - (RCID CLINIC ONLY)
CD4 % Helper T Cell: 33 % (ref 33–55)
CD4 T Cell Abs: 1100 uL (ref 400–2700)

## 2011-09-12 LAB — HIV-1 RNA QUANT-NO REFLEX-BLD: HIV-1 RNA Quant, Log: <1.3 {Log} (ref <1.30)

## 2011-09-22 ENCOUNTER — Ambulatory Visit: Payer: 59 | Admitting: Internal Medicine

## 2011-09-24 ENCOUNTER — Ambulatory Visit (INDEPENDENT_AMBULATORY_CARE_PROVIDER_SITE_OTHER): Payer: 59 | Admitting: Internal Medicine

## 2011-09-24 ENCOUNTER — Encounter: Payer: Self-pay | Admitting: Internal Medicine

## 2011-09-24 ENCOUNTER — Other Ambulatory Visit: Payer: Self-pay | Admitting: Licensed Clinical Social Worker

## 2011-09-24 VITALS — BP 149/99 | HR 63 | Temp 98.2°F | Ht 64.0 in | Wt 137.5 lb

## 2011-09-24 DIAGNOSIS — Z21 Asymptomatic human immunodeficiency virus [HIV] infection status: Secondary | ICD-10-CM

## 2011-09-24 DIAGNOSIS — B20 Human immunodeficiency virus [HIV] disease: Secondary | ICD-10-CM

## 2011-09-24 MED ORDER — ZIDOVUDINE 300 MG PO TABS: 300.0000 mg | ORAL_TABLET | Freq: Two times a day (BID) | ORAL | Status: DC

## 2011-09-24 NOTE — Progress Notes (Signed)
Patient ID: Daniel Gonzalez, male   DOB: September 01, 1955, 56 y.o.   MRN: [id number]  INFECTIOUS DISEASE PROGRESS NOTE    Subjective: Daniel Gonzalez is in for his routine visit. He could not tolerate Atripla so was switched to Complera and is tolerating that well. However, he is taking that at bedtime and not with a meal. He was in the urgent care Center recently with recurrent boil under his left arm. It was incised and drained and is now resolved.  Objective:  Temp: 98.2 F (36.8 C) (02/06 1547) Temp src: Oral (02/06 1547) BP: 143/101 mmHg (02/06 1553) Pulse Rate: 66  (02/06 1553)   General: He is in good spirits and in no distress Skin: No rash. His boil is healing nicely Lungs: Clear Cor: Regular S1 and S2 no murmurs Abdomen: Nontender   Lab Results Lab Results  Component Value Date   WBC 5.0 09/10/2011   HGB 12.8* 09/10/2011   HCT 37.7* 09/10/2011   MCV 107.1* 09/10/2011   PLT 257 09/10/2011    Lab Results  Component Value Date   CREATININE 1.07 09/10/2011   BUN 8 09/10/2011   NA 139 09/10/2011   K 3.8 09/10/2011   CL 107 09/10/2011   CO2 27 09/10/2011    Lab Results  Component Value Date   ALT 12 09/10/2011   AST 17 09/10/2011   ALKPHOS 67 09/10/2011   BILITOT 0.3 09/10/2011      HIV 1 RNA Quant (copies/mL)  Date Value  09/10/2011 <20   02/04/2011 56*  08/23/2010 <20 copies/mL      CD4 T Cell Abs (cmm)  Date Value  09/11/2011 1100   02/04/2011 1060   08/22/2010 820      Assessment: His HIV infection remains under excellent control. I will continue his current regimen, but having him take his Complera so that he takes it with breakfast in the morning.  His blood pressure was quite elevated up on first arrival but was down some to 140/95 before leaving.  Plan: 1. Continue current antiretroviral regimen 2. Monitor blood pressure at subsequent visit   Cliffton Asters, MD [facility] [phone] pager   [phone] cell 09/24/2011,  4:18 PM

## 2011-11-03 ENCOUNTER — Other Ambulatory Visit: Payer: Self-pay | Admitting: *Deleted

## 2011-11-03 DIAGNOSIS — B20 Human immunodeficiency virus [HIV] disease: Secondary | ICD-10-CM

## 2011-11-03 MED ORDER — EMTRICITAB-RILPIVIR-TENOFOV DF 200-25-300 MG PO TABS: 1.0000 | ORAL_TABLET | Freq: Once | ORAL | Status: DC

## 2011-12-11 ENCOUNTER — Telehealth: Payer: Self-pay | Admitting: *Deleted

## 2011-12-11 NOTE — Telephone Encounter (Signed)
rec'd a fax from [employer] announcing that he has been dis-enrolled as of today. I called them to find out why. They told me it was for non-compliance. He used his discount card once in December of last year and none since. I called the pt & left a message asking him to call me.

## 2012-01-14 ENCOUNTER — Telehealth: Payer: Self-pay | Admitting: *Deleted

## 2012-01-14 NOTE — Telephone Encounter (Signed)
Pt called to report that he has not been having any co-pays for his medication.  That was the reason that he was not using the Patient Assistant Cared that he had received.

## 2012-03-09 ENCOUNTER — Other Ambulatory Visit: Payer: 59

## 2012-03-10 ENCOUNTER — Other Ambulatory Visit: Payer: 59

## 2012-03-10 DIAGNOSIS — B20 Human immunodeficiency virus [HIV] disease: Secondary | ICD-10-CM

## 2012-03-10 LAB — CBC
Hemoglobin: 12.4 g/dL — ABNORMAL LOW (ref 13.0–17.0)
MCV: 108.6 fL — ABNORMAL HIGH (ref 78.0–100.0)
Platelets: 238 10*3/uL (ref 150–400)
RBC: 3.38 MIL/uL — ABNORMAL LOW (ref 4.22–5.81)
WBC: 4.9 10*3/uL (ref 4.0–10.5)

## 2012-03-11 LAB — COMPREHENSIVE METABOLIC PANEL
ALT: 9 U/L (ref 0–53)
CO2: 26 mEq/L (ref 19–32)
Calcium: 9 mg/dL (ref 8.4–10.5)
Chloride: 106 mEq/L (ref 96–112)
Glucose, Bld: 104 mg/dL — ABNORMAL HIGH (ref 70–99)
Sodium: 139 mEq/L (ref 135–145)
Total Protein: 6.7 g/dL (ref 6.0–8.3)

## 2012-03-11 LAB — LIPID PANEL
Cholesterol: 149 mg/dL (ref 0–200)
Triglycerides: 68 mg/dL (ref <150)

## 2012-03-12 LAB — T-HELPER CELL (CD4) - (RCID CLINIC ONLY)
CD4 % Helper T Cell: 30 % — ABNORMAL LOW (ref 33–55)
CD4 T Cell Abs: 880 uL (ref 400–2700)

## 2012-03-15 LAB — HIV-1 RNA QUANT-NO REFLEX-BLD: HIV 1 RNA Quant: <20 copies/mL (ref <20)

## 2012-03-23 ENCOUNTER — Ambulatory Visit (INDEPENDENT_AMBULATORY_CARE_PROVIDER_SITE_OTHER): Payer: 59 | Admitting: Internal Medicine

## 2012-03-23 VITALS — BP 169/109 | HR 67 | Temp 98.4°F | Ht 64.0 in | Wt 138.5 lb

## 2012-03-23 DIAGNOSIS — B20 Human immunodeficiency virus [HIV] disease: Secondary | ICD-10-CM

## 2012-03-23 NOTE — Progress Notes (Signed)
Patient ID: Daniel Gonzalez, male   DOB: 1956-01-02, 56 y.o.   MRN: [id number]     [facility] Disease  Patient Active Problem List  Diagnosis  . HIV DISEASE  . GENITAL HERPES  . ERECTILE DYSFUNCTION  . ABUSE, ALCOHOL, EPISODIC  . CIGARETTE SMOKER  . EPISODIC CLUSTER HEADACHE  . CARPAL TUNNEL SYNDROME  . PNEUMOCOCCAL PNEUMONIA  . DIVERTICULOSIS, COLON W/O HEM  . BOILS, RECURRENT  . PILONIDAL CYST WITHOUT MENTION OF ABSCESS  . LOSS OF APPETITE  . FRACTURE, FINGER  . HYPERTENSION NEC  . HEADACHE, CHRONIC, HX OF    Patient's Medications  New Prescriptions   No medications on file  Previous Medications   EMTRICITAB-RILPIVIR-TENOFOVIR (COMPLERA) 200-25-300 MG TABS    Take 1 tablet by mouth once. Must be taken with a medium-sized meal.   SILDENAFIL (VIAGRA) 100 MG TABLET    Take 100 mg by mouth as directed.     VALACYCLOVIR (VALTREX) 500 MG TABLET    Take 500 mg by mouth 2 (two) times daily as needed.     ZIDOVUDINE (RETROVIR) 300 MG TABLET    Take 1 tablet (300 mg total) by mouth 2 (two) times daily.  Modified Medications   No medications on file  Discontinued Medications   No medications on file    Subjective: Daniel Gonzalez is in for his routine visit. He denies missing any doses of his Complera or Retrovir. He is feeling well. He states that he is drinking less alcohol now that he is living alone. He has not quit smoking cigarettes and states that he does not really want to quit now.  Objective: Temp: 98.4 F (36.9 C) (08/06 1529) Temp src: Oral (08/06 1529) BP: 169/114 mmHg (08/06 1531) Pulse Rate: 64  (08/06 1531)  General: Is in no distress and in good spirits as usual Skin: No rash Lungs: Clear Cor: Regular S1 and S2 with no murmurs  Lab Results HIV 1 RNA Quant (copies/mL)  Date Value  03/10/2012 <20   09/10/2011 <20   02/04/2011 56*     CD4 T Cell Abs (cmm)  Date Value  03/11/2012 880   09/11/2011 1100   02/04/2011 1060      Assessment: His  HIV infection remains under excellent control.  His blood pressure is quite elevated. Given him the names of primary care doctors in her network and encouraged him to establish primary care soon as possible.  I talked to him about cigarette cessation and given him the [place] quit line.  Plan: 1. Continue Atripla 2. Cigarette cessation counseling 3. Establish primary care 4. Follow up here after blood work in 6 months   Cliffton Asters, MD [facility] [phone] pager   [phone] cell 03/23/2012, 3:53 PM

## 2012-03-26 ENCOUNTER — Ambulatory Visit (INDEPENDENT_AMBULATORY_CARE_PROVIDER_SITE_OTHER): Payer: 59 | Admitting: Family Medicine

## 2012-03-26 ENCOUNTER — Encounter: Payer: Self-pay | Admitting: Family Medicine

## 2012-03-26 VITALS — BP 145/92 | HR 70 | Temp 98.0°F | Ht 64.0 in | Wt 136.4 lb

## 2012-03-26 DIAGNOSIS — IMO0002 Reserved for concepts with insufficient information to code with codable children: Secondary | ICD-10-CM

## 2012-03-26 DIAGNOSIS — G44019 Episodic cluster headache, not intractable: Secondary | ICD-10-CM

## 2012-03-26 MED ORDER — DIOVAN 160 MG PO TABS: 160.0000 mg | ORAL_TABLET | Freq: Every day | ORAL | Status: DC

## 2012-03-26 NOTE — Assessment & Plan Note (Signed)
New to provider.  Pt has seen neuro previously.  Has meds available to take in case of sxs.  Continues to get frequent HAs but not full fledged cluster type.  Will follow and refer back to neuro prn.

## 2012-03-26 NOTE — Patient Instructions (Addendum)
Follow up in 1 month to recheck BP Start the Diovan- 1 tab daily QUIT SMOKING!!! Continue to eat a low salt diet Try and get regular exercise Call with any questions or concerns Hang in there! Have a great weekend!

## 2012-03-26 NOTE — Progress Notes (Signed)
  Subjective:    Patient ID: Daniel Gonzalez, male    DOB: [birth year], 56 y.o.   MRN: [id number]  HPI New to establish.  IDOrvan Gonzalez  HTN- chronic problem, was elevated in Feb and again this week at ID.  Strong family hx- both parents.  No CP, visual changes, edema.  + HAs, SOB.  Current smoker- 3/4 ppd.  Not interested in quitting.  Has recently decreased salt intake.  No exercise.  Cluster HA- hx of similar, always occurs on L side.  Typically lasts 5-10 minutes.  Will get 4-5 per day but 'some days i don't get any'.  Has seen neuro in the past Daniel Gonzalez).  Was given meds to take if he felt a HA coming on but hasn't had to take meds in ~18 months.   Review of Systems For ROS see HPI     Objective:   Physical Exam  Constitutional: He is oriented to person, place, and time. He appears well-developed and well-nourished. No distress.  HENT:  Head: Normocephalic and atraumatic.  Eyes: Conjunctivae and EOM are normal. Pupils are equal, round, and reactive to light.  Neck: Normal range of motion. Neck supple. No thyromegaly present.  Cardiovascular: Normal rate, regular rhythm, normal heart sounds and intact distal pulses.   No murmur heard. Pulmonary/Chest: Effort normal and breath sounds normal. No respiratory distress. He has no wheezes. He has no rales.  Abdominal: Soft. Bowel sounds are normal. He exhibits no distension.  Musculoskeletal: He exhibits no edema.  Lymphadenopathy:    He has no cervical adenopathy.  Neurological: He is alert and oriented to person, place, and time. No cranial nerve deficit.  Skin: Skin is warm and dry.  Psychiatric: He has a normal mood and affect. His behavior is normal.          Assessment & Plan:

## 2012-03-26 NOTE — Assessment & Plan Note (Signed)
New to provider.  After reviewing pt's recent BPs, he needs to start meds.  Discussed different options and possible side effects as he was resistant to taking med.  Pt elects to start ARB.  Will start Diovan daily and recheck BMP at upcoming visit.  Encouraged smoking cessation, low Na diet, and regular exercise.  Will follow.

## 2012-04-12 ENCOUNTER — Other Ambulatory Visit: Payer: Self-pay | Admitting: *Deleted

## 2012-04-12 DIAGNOSIS — B20 Human immunodeficiency virus [HIV] disease: Secondary | ICD-10-CM

## 2012-04-12 MED ORDER — ZIDOVUDINE 300 MG PO TABS: 300.0000 mg | ORAL_TABLET | Freq: Two times a day (BID) | ORAL | Status: DC

## 2012-04-12 NOTE — Telephone Encounter (Signed)
rx sent in to [facility].  

## 2012-04-23 ENCOUNTER — Encounter: Payer: Self-pay | Admitting: Family Medicine

## 2012-04-23 ENCOUNTER — Ambulatory Visit (INDEPENDENT_AMBULATORY_CARE_PROVIDER_SITE_OTHER): Payer: 59 | Admitting: Family Medicine

## 2012-04-23 VITALS — BP 142/82 | HR 73 | Temp 98.2°F | Ht 65.0 in | Wt 133.6 lb

## 2012-04-23 DIAGNOSIS — IMO0002 Reserved for concepts with insufficient information to code with codable children: Secondary | ICD-10-CM

## 2012-04-23 LAB — BASIC METABOLIC PANEL
CO2: 20 mEq/L (ref 19–32)
Chloride: 110 mEq/L (ref 96–112)
Creatinine, Ser: 0.9 mg/dL (ref 0.4–1.5)
Glucose, Bld: 70 mg/dL (ref 70–99)

## 2012-04-23 MED ORDER — VALSARTAN-HYDROCHLOROTHIAZIDE 160-12.5 MG PO TABS: 1.0000 | ORAL_TABLET | Freq: Every day | ORAL | Status: DC

## 2012-04-23 NOTE — Patient Instructions (Addendum)
Follow up in 1 month to recheck BP Start the Diovan HCT combo pill once daily- in the morning Drink plenty of fluids Call with any questions or concerns Happy Early [place] Ouch!!!

## 2012-04-23 NOTE — Assessment & Plan Note (Signed)
Improved since last visit but still not at goal.  Will change to Diovan HCT.  Check BMP.  Reviewed supportive care and red flags that should prompt return.  Pt expressed understanding and is in agreement w/ plan.

## 2012-04-23 NOTE — Progress Notes (Signed)
  Subjective:    Patient ID: Daniel Gonzalez, male    DOB: 14-Jan-1956, 56 y.o.   MRN: [id number]  HPI HTN- chronic problem, # of HAs has decreased since starting Diovan.  DBP has dropped considerably since starting meds.  No CP, SOB, visual changes, edema.     Review of Systems For ROS see HPI     Objective:   Physical Exam  Vitals reviewed. Constitutional: He is oriented to person, place, and time. He appears well-developed and well-nourished. No distress.  HENT:  Head: Normocephalic and atraumatic.  Eyes: Conjunctivae and EOM are normal. Pupils are equal, round, and reactive to light.  Neck: Normal range of motion. Neck supple. No thyromegaly present.  Cardiovascular: Normal rate, regular rhythm, normal heart sounds and intact distal pulses.   No murmur heard. Pulmonary/Chest: Effort normal and breath sounds normal. No respiratory distress.  Abdominal: Soft. Bowel sounds are normal. He exhibits no distension.  Musculoskeletal: He exhibits no edema.  Lymphadenopathy:    He has no cervical adenopathy.  Neurological: He is alert and oriented to person, place, and time. No cranial nerve deficit.  Skin: Skin is warm and dry.  Psychiatric: He has a normal mood and affect. His behavior is normal.          Assessment & Plan:

## 2012-04-26 ENCOUNTER — Encounter: Payer: Self-pay | Admitting: *Deleted

## 2012-05-03 ENCOUNTER — Telehealth: Payer: Self-pay | Admitting: Family Medicine

## 2012-05-03 NOTE — Telephone Encounter (Signed)
Pt left vm on triage line stating he has a question for Dr. Beverely Low. He left his call back # [phone] and no further info.

## 2012-05-03 NOTE — Telephone Encounter (Signed)
Returned pt call, pt notes he took the new BP medication for less than a week and stopped taking on Friday per noted impotence and also noted that the Excedrin he was taking may have caused him to have the same issue, pt advised that the viagra did not even help him, please advise if the impotence was due to the BP meds or excredrin

## 2012-05-03 NOTE — Telephone Encounter (Signed)
Can stop the Diovan HCT and switch to Diovan 320mg  daily (currently on 160mg )

## 2012-05-04 ENCOUNTER — Other Ambulatory Visit: Payer: Self-pay | Admitting: Family Medicine

## 2012-05-04 MED ORDER — VALSARTAN 320 MG PO TABS: 320.0000 mg | ORAL_TABLET | Freq: Every day | ORAL | Status: DC

## 2012-05-04 NOTE — Telephone Encounter (Signed)
Pt switched from Diovan-HCTZ 160mg  to Diovan 320mg . Sent to [facility] per the pt.

## 2012-05-07 ENCOUNTER — Ambulatory Visit: Payer: 59

## 2012-05-10 ENCOUNTER — Ambulatory Visit (INDEPENDENT_AMBULATORY_CARE_PROVIDER_SITE_OTHER): Payer: 59

## 2012-05-10 DIAGNOSIS — Z23 Encounter for immunization: Secondary | ICD-10-CM

## 2012-05-21 ENCOUNTER — Ambulatory Visit (INDEPENDENT_AMBULATORY_CARE_PROVIDER_SITE_OTHER): Payer: 59 | Admitting: Family Medicine

## 2012-05-21 ENCOUNTER — Telehealth: Payer: Self-pay | Admitting: *Deleted

## 2012-05-21 ENCOUNTER — Encounter: Payer: Self-pay | Admitting: Family Medicine

## 2012-05-21 VITALS — BP 132/90 | HR 76 | Temp 98.1°F | Resp 16 | Wt 135.0 lb

## 2012-05-21 DIAGNOSIS — IMO0002 Reserved for concepts with insufficient information to code with codable children: Secondary | ICD-10-CM

## 2012-05-21 LAB — BASIC METABOLIC PANEL
BUN: 8 mg/dL (ref 6–23)
Chloride: 112 mEq/L (ref 96–112)
Creatinine, Ser: 0.9 mg/dL (ref 0.4–1.5)
Glucose, Bld: 71 mg/dL (ref 70–99)
Potassium: 4.1 mEq/L (ref 3.5–5.1)

## 2012-05-21 MED ORDER — VALSARTAN 320 MG PO TABS: 320.0000 mg | ORAL_TABLET | Freq: Every day | ORAL | Status: DC

## 2012-05-21 NOTE — Patient Instructions (Signed)
Schedule your complete physical in 6 months Continue the Diovan 320mg  daily Keep up the good work! Call with any questions or concerns HAPPY EARLY BIRTHDAY!!!!

## 2012-05-21 NOTE — Telephone Encounter (Signed)
Message copied by Elnora Morrison on Fri May 21, 2012  4:14 PM ------      Message from: Sheliah Hatch      Created: Fri May 21, 2012 12:02 PM       BMP normal- can continue Diovan as discussed

## 2012-05-21 NOTE — Assessment & Plan Note (Signed)
Improved.  Pt's DBP remains slightly high but SBP has improved w/ increased Diovan.  Pt did not tolerate addition of HCTZ.  Will not make med changes at this time but will follow for 6 months and reassess.  Check BMP to assess Cr and K+.  Reviewed supportive care and red flags that should prompt return.  Pt expressed understanding and is in agreement w/ plan.

## 2012-05-21 NOTE — Progress Notes (Signed)
  Subjective:    Patient ID: Daniel Gonzalez, male    DOB: [birth year], 56 y.o.   MRN: [id number]  HPI HTN- chronic problem for pt, Diovan was increased to 320mg  at last visit.  SBP has decreased from 145 --> 132.  No CP, SOB, visual changes, edema.  When HCTZ was added previously he reported sexual dysfunction.  Not interested in restarting this.   Review of Systems For ROS see HPI     Objective:   Physical Exam  Vitals reviewed. Constitutional: He is oriented to person, place, and time. He appears well-developed and well-nourished. No distress.  HENT:  Head: Normocephalic and atraumatic.  Eyes: Conjunctivae normal and EOM are normal. Pupils are equal, round, and reactive to light.  Neck: Normal range of motion. Neck supple. No thyromegaly present.  Cardiovascular: Normal rate, regular rhythm, normal heart sounds and intact distal pulses.   No murmur heard. Pulmonary/Chest: Effort normal and breath sounds normal. No respiratory distress.  Abdominal: Soft. Bowel sounds are normal. He exhibits no distension.  Musculoskeletal: He exhibits no edema.  Lymphadenopathy:    He has no cervical adenopathy.  Neurological: He is alert and oriented to person, place, and time. No cranial nerve deficit.  Skin: Skin is warm and dry.  Psychiatric: He has a normal mood and affect. His behavior is normal.          Assessment & Plan:

## 2012-05-21 NOTE — Telephone Encounter (Signed)
Patient notified of lab results BMP normal and to continue medications diovan

## 2012-07-02 ENCOUNTER — Encounter (HOSPITAL_COMMUNITY): Payer: Self-pay | Admitting: Emergency Medicine

## 2012-07-02 ENCOUNTER — Emergency Department (INDEPENDENT_AMBULATORY_CARE_PROVIDER_SITE_OTHER)
Admission: EM | Admit: 2012-07-02 | Discharge: 2012-07-02 | Disposition: A | Discharge status: Home / Self Care | Payer: 59 | Source: Home / Self Care | Attending: Family Medicine | Admitting: Family Medicine

## 2012-07-02 DIAGNOSIS — L0501 Pilonidal cyst with abscess: Secondary | ICD-10-CM

## 2012-07-02 MED ORDER — SULFAMETHOXAZOLE-TRIMETHOPRIM 800-160 MG PO TABS: 1.0000 | ORAL_TABLET | Freq: Two times a day (BID) | ORAL | Status: AC

## 2012-07-02 NOTE — ED Notes (Signed)
Pt c/o abscess x2/3 weeks ago in the sacrum area... Denies: drainage, fevers, vomiting, nausea, diarrhea.... Had a cyst removed about 2 years ago around the same area... Pt is alert w/no signs of distress.

## 2012-07-02 NOTE — ED Provider Notes (Signed)
History     CSN: [id number]  Arrival date & time 07/02/12  1053   None     Chief Complaint  Patient presents with  . Abscess    (Consider location/radiation/quality/duration/timing/severity/associated sxs/prior treatment) Patient is a 56 y.o. male presenting with abscess. The history is provided by the patient.  Abscess  This is a new problem. The current episode started more than one week ago (3 weeks ago). The abscess is present on the back. The problem is mild. The abscess is characterized by painfulness and swelling. The abscess first occurred at home. Pertinent negatives include no anorexia, no decrease in physical activity, not sleeping less, not drinking less, no fever, no fussiness, not sleeping more, no diarrhea, no vomiting, no congestion, no rhinorrhea, no sore throat, no decreased responsiveness and no cough. Past medical history comments: previous surgery for piloniday abscess. There were no sick contacts. He has received no recent medical care.    Past Medical History  Diagnosis Date  . Cluster headache   . Blood in stool   . Migraine   . Frequent episodic tension-type headache   . Hypertension   . HIV infection     Past Surgical History  Procedure Date  . Cyst in back     removed from back    Family History  Problem Relation Age of Onset  . Arthritis Mother   . Heart disease Mother   . Hypertension Mother   . Diabetes Mother   . Arthritis Father   . Heart disease Father   . Hypertension Father     History  Substance Use Topics  . Smoking status: Current Every Day Smoker -- 0.7 packs/day for 35 years    Types: Cigarettes  . Smokeless tobacco: Never Used  . Alcohol Use: 1.0 oz/week    2 drink(s) per week      Review of Systems  Constitutional: Negative for fever and decreased responsiveness.  HENT: Negative for congestion, sore throat and rhinorrhea.   Respiratory: Negative for cough.   Gastrointestinal: Negative for vomiting, diarrhea and  anorexia.  All other systems reviewed and are negative.    Allergies  Review of patient's allergies indicates no known allergies.  Home Medications   Current Outpatient Rx  Name  Route  Sig  Dispense  Refill  . EMTRICITAB-RILPIVIR-TENOFOVIR 200-25-300 MG PO TABS   Oral   Take 1 tablet by mouth once. Must be taken with a medium-sized meal.   90 tablet   3   . ZIDOVUDINE 300 MG PO TABS   Oral   Take 1 tablet (300 mg total) by mouth 2 (two) times daily.   180 tablet   4   . SILDENAFIL CITRATE 100 MG PO TABS   Oral   Take 100 mg by mouth as directed.           . SULFAMETHOXAZOLE-TRIMETHOPRIM 800-160 MG PO TABS   Oral   Take 1 tablet by mouth 2 (two) times daily.   14 tablet   0   . VALACYCLOVIR HCL 500 MG PO TABS   Oral   Take 500 mg by mouth 2 (two) times daily as needed.           Marland Kitchen VALSARTAN 320 MG PO TABS   Oral   Take 1 tablet (320 mg total) by mouth daily.   30 tablet   6     BP 146/89  Pulse 65  Temp 98.4 F (36.9 C) (Oral)  Resp 18  SpO2  100%  Physical Exam  Nursing note and vitals reviewed. Constitutional: He is oriented to person, place, and time. Vital signs are normal. He appears well-developed and well-nourished. He is active and cooperative.  HENT:  Head: Normocephalic.  Eyes: Conjunctivae normal are normal. Pupils are equal, round, and reactive to light. No scleral icterus.  Neck: Trachea normal. Neck supple.  Cardiovascular: Normal rate and regular rhythm.   Pulmonary/Chest: Effort normal and breath sounds normal.  Musculoskeletal: Normal range of motion.  Neurological: He is alert and oriented to person, place, and time. No cranial nerve deficit or sensory deficit.  Skin: Skin is warm and dry.     Psychiatric: He has a normal mood and affect. His speech is normal and behavior is normal. Judgment and thought content normal. Cognition and memory are normal.    ED Course  Procedures (including critical care time)  Labs Reviewed -  No data to display No results found.   1. Pilonidal abscess       MDM  Continue heat therapy, antibiotics as prescribed, follow up with surgeon if symptoms are not improved.        Johnsie Kindred, NP 07/02/12 1224

## 2012-07-06 NOTE — ED Provider Notes (Signed)
Medical screening examination/treatment/procedure(s) were performed by resident physician or non-physician practitioner and as supervising physician I was immediately available for consultation/collaboration.   Marika Mahaffy DOUGLAS MD.    Malika Demario D Printice Hellmer, MD 07/06/12 1050 

## 2012-07-20 ENCOUNTER — Telehealth: Payer: Self-pay | Admitting: *Deleted

## 2012-07-20 ENCOUNTER — Ambulatory Visit (INDEPENDENT_AMBULATORY_CARE_PROVIDER_SITE_OTHER): Payer: 59 | Admitting: Internal Medicine

## 2012-07-20 ENCOUNTER — Encounter: Payer: Self-pay | Admitting: Internal Medicine

## 2012-07-20 VITALS — BP 135/95 | HR 72 | Temp 98.0°F | Ht 64.0 in | Wt 143.0 lb

## 2012-07-20 DIAGNOSIS — L0591 Pilonidal cyst without abscess: Secondary | ICD-10-CM

## 2012-07-20 MED ORDER — SULFAMETHOXAZOLE-TRIMETHOPRIM 800-160 MG PO TABS: 1.0000 | ORAL_TABLET | Freq: Two times a day (BID) | ORAL | Status: DC

## 2012-07-20 NOTE — Progress Notes (Signed)
  Subjective:    Patient ID: Daniel Gonzalez, male    DOB: [birth year], 56 y.o.   MRN: [id number]  HPI He comes in for a followup from the emergency room. He has a history of a pilonidal cyst and had an exacerbation in this area. He describes a cyst that has burst. He was given Bactrim and it did improve. He though has had intermittent problems from this sense. No fever or chills. He does seem improved but does continue to bother him.   Review of Systems  Constitutional: Negative for fever and chills.  Gastrointestinal: Negative for diarrhea.       Objective:   Physical Exam  Genitourinary:       A 3-4 mm movable cyst like lesion in the upper portion of the gluteal fold at the site of his previous surgery area no significant tenderness, no erythema, no warmth.          Assessment & Plan:

## 2012-07-20 NOTE — Assessment & Plan Note (Signed)
This may be an exacerbation of this, I will have him go back to see his surgeon

## 2012-07-20 NOTE — Telephone Encounter (Signed)
Patient called and advised that he has a painful ulcer on the top of his buttocks. He advised he has had this before and it was operated on in the past. He advised he really needs to be seen as it is getting more and more uncomfortable. Gave him an appt with Dr Luciana Axe for today 07/20/12 at 415 pm. Advised he needs time to arrange to get off work early.

## 2012-07-28 ENCOUNTER — Encounter (INDEPENDENT_AMBULATORY_CARE_PROVIDER_SITE_OTHER): Payer: Self-pay | Admitting: Surgery

## 2012-07-28 ENCOUNTER — Ambulatory Visit (INDEPENDENT_AMBULATORY_CARE_PROVIDER_SITE_OTHER): Payer: 59 | Admitting: Surgery

## 2012-07-28 VITALS — BP 118/80 | HR 78 | Temp 97.9°F | Resp 16 | Ht 64.0 in | Wt 140.8 lb

## 2012-07-28 DIAGNOSIS — L0501 Pilonidal cyst with abscess: Secondary | ICD-10-CM

## 2012-07-28 NOTE — Progress Notes (Signed)
Subjective:     Patient ID: Daniel Gonzalez, male   DOB: February 01, 1956, 56 y.o.   MRN: [id number]  HPI This is a patient of Dr. Annette Stable that had a previous excision of the palmar abscess 2 years ago. He did well until approximately 3 weeks ago when he developed a tender draining area in the gluteal fold. It has improved with antibiotics. He now has minimal discomfort  Review of Systems     Objective:   Physical Exam    On exam, there is a small area of tenderness at the gluteal cleft. There is no obvious abscess Assessment:     Pilonidal infection    Plan:     I am going to change him to doxycycline and see him back in approximately 1-1/2 weeks. Hopefully an incision and drainage or excision will not have to be performed

## 2012-08-06 ENCOUNTER — Encounter (INDEPENDENT_AMBULATORY_CARE_PROVIDER_SITE_OTHER): Payer: Self-pay | Admitting: Surgery

## 2012-08-06 ENCOUNTER — Ambulatory Visit (INDEPENDENT_AMBULATORY_CARE_PROVIDER_SITE_OTHER): Payer: 59 | Admitting: Surgery

## 2012-08-06 VITALS — BP 138/80 | HR 60 | Temp 98.3°F | Resp 18 | Ht 61.0 in | Wt 139.4 lb

## 2012-08-06 DIAGNOSIS — S31809A Unspecified open wound of unspecified buttock, initial encounter: Secondary | ICD-10-CM

## 2012-08-06 NOTE — Progress Notes (Signed)
Subjective:     Patient ID: Daniel Gonzalez, male   DOB: 08-25-1955, 56 y.o.   MRN: [id number]  HPI He feels much better and is now on the symptom-free. He denies drainage. He still has a couple days of antibiotics left  Review of Systems     Objective:   Physical Exam On exam, the wound is completely healed and there is no evidence of ongoing infection    Assessment:     Patient stable with healed gluteal wound    Plan:     I will see him back as needed

## 2012-08-23 ENCOUNTER — Other Ambulatory Visit: Payer: Self-pay | Admitting: *Deleted

## 2012-08-23 ENCOUNTER — Telehealth: Payer: Self-pay | Admitting: *Deleted

## 2012-08-23 NOTE — Telephone Encounter (Signed)
Patient called and advised that he was not able to afford his medication copays. He advised he was told it will be about $2000 and he just does not have that. When I asked him if they told him why it will be so much he advised no but that he wants [place] to call his insurance and find out.  Called his insurance company [facility] and was advised that the patient insurance had reset and his individual copay is $2850 and his family is $5700 therefore the patient will have to pay [phone] for the Complera and $22.74 for the Zidovudine. She further explained that until the complete medication deductible is met he will have to pay out of pocket. I asked if the patient could have the fee broken into manageable chunks and she advised no. Advised her will contact the patient and try to explain.  Called the patient and advised him of what I found out and he simply can not afford this. Told the patient that the amount he was quoted was his deductible and he will have to meet it and until he does his insurance will not pay much. He advised he can not afford it and if that is the case he will not be able to take his medication. Advised him we have Pam J. Here who does the PAP program. I advised him can not promise anything but she may be able to help and to give her a call tomorrow to see if she can.

## 2012-08-25 ENCOUNTER — Telehealth: Payer: Self-pay | Admitting: *Deleted

## 2012-08-25 DIAGNOSIS — B20 Human immunodeficiency virus [HIV] disease: Secondary | ICD-10-CM

## 2012-08-25 MED ORDER — EMTRICITAB-RILPIVIR-TENOFOV DF 200-25-300 MG PO TABS: 1.0000 | ORAL_TABLET | Freq: Once | ORAL | Status: DC

## 2012-08-25 NOTE — Telephone Encounter (Signed)
Phone call to [facility].   Given the following information about the pt's HIV medications and his health insurance annual deductible:      Annual deductible =              $2850.00 / year      Complera monthly co-pay = [phone] / month      Retrovir monthly co-pay =     $22.74 / month New printed Complera rx placed in Dr. Blair Dolphin box for signature with pt's "red" folder.

## 2012-08-25 NOTE — Telephone Encounter (Signed)
RN spoke with pt.  Instructed to bring in financial information when he comes for lab appt on 09/02/12.  Needs to reapply for Advancing Access Program through [place].  Will need "emergency" refill for Complera to assist with rx while new application is being processed.  Will route this message to Rinaldo Cloud, PAP program coordinator to assist pt when he comes in 09/02/12.  Pt verbalized understanding and will bring in all information requested.

## 2012-09-02 ENCOUNTER — Other Ambulatory Visit: Payer: 59

## 2012-09-02 ENCOUNTER — Other Ambulatory Visit: Payer: Self-pay | Admitting: *Deleted

## 2012-09-02 DIAGNOSIS — B20 Human immunodeficiency virus [HIV] disease: Secondary | ICD-10-CM

## 2012-09-02 LAB — CBC
Hemoglobin: 12.1 g/dL — ABNORMAL LOW (ref 13.0–17.0)
MCH: 37.2 pg — ABNORMAL HIGH (ref 26.0–34.0)
MCV: 104.9 fL — ABNORMAL HIGH (ref 78.0–100.0)
RBC: 3.25 MIL/uL — ABNORMAL LOW (ref 4.22–5.81)

## 2012-09-02 MED ORDER — EMTRICITAB-RILPIVIR-TENOFOV DF 200-25-300 MG PO TABS: 1.0000 | ORAL_TABLET | Freq: Once | ORAL | Status: DC

## 2012-09-03 LAB — COMPREHENSIVE METABOLIC PANEL
CO2: 22 mEq/L (ref 19–32)
Calcium: 8.7 mg/dL (ref 8.4–10.5)
Chloride: 105 mEq/L (ref 96–112)
Creat: 0.83 mg/dL (ref 0.50–1.35)
Glucose, Bld: 81 mg/dL (ref 70–99)
Sodium: 141 mEq/L (ref 135–145)
Total Bilirubin: 0.3 mg/dL (ref 0.3–1.2)
Total Protein: 6.5 g/dL (ref 6.0–8.3)

## 2012-09-03 LAB — LIPID PANEL
Cholesterol: 114 mg/dL (ref 0–200)
Triglycerides: 107 mg/dL (ref <150)
VLDL: 21 mg/dL (ref 0–40)

## 2012-09-03 LAB — T-HELPER CELL (CD4) - (RCID CLINIC ONLY): CD4 % Helper T Cell: 29 % — ABNORMAL LOW (ref 33–55)

## 2012-09-06 ENCOUNTER — Telehealth: Payer: Self-pay | Admitting: Internal Medicine

## 2012-09-06 NOTE — Telephone Encounter (Signed)
Faxed application to [employer] today for [name] today.  Will follow-up with [facility] Thursday 09-09-12

## 2012-09-09 ENCOUNTER — Telehealth: Payer: Self-pay | Admitting: Internal Medicine

## 2012-09-09 NOTE — Telephone Encounter (Signed)
Received fax from [facility] Advancing Access requesting additional information.  I called Mr. Ricciardi and told him I need his most recent paystub.  I called [name] and told them he is faxing his most recent pay stub to me tomorrow and I will fax it to them Monday.

## 2012-09-16 ENCOUNTER — Ambulatory Visit (INDEPENDENT_AMBULATORY_CARE_PROVIDER_SITE_OTHER): Payer: 59 | Admitting: Internal Medicine

## 2012-09-16 ENCOUNTER — Encounter: Payer: Self-pay | Admitting: Internal Medicine

## 2012-09-16 ENCOUNTER — Telehealth: Payer: Self-pay | Admitting: *Deleted

## 2012-09-16 VITALS — BP 183/108 | HR 75 | Temp 98.3°F | Ht 64.0 in | Wt 141.5 lb

## 2012-09-16 DIAGNOSIS — B20 Human immunodeficiency virus [HIV] disease: Secondary | ICD-10-CM

## 2012-09-16 DIAGNOSIS — R7989 Other specified abnormal findings of blood chemistry: Secondary | ICD-10-CM

## 2012-09-16 DIAGNOSIS — D531 Other megaloblastic anemias, not elsewhere classified: Secondary | ICD-10-CM

## 2012-09-16 NOTE — Telephone Encounter (Signed)
AST (liver function) is slightly elevated- should hold any alcohol and tylenol products x2 weeks and then repeat LFTs Pt is mildly anemic and MCV is high- indicating a possible B12 and folate deficiency.  When he has labs done in 2 weeks will add B12 and Folate (dx megaloblastic anemia)

## 2012-09-16 NOTE — Telephone Encounter (Signed)
Pt left VM that he had some labs done at another provider and would like for you to review results since some values were high. .Please advise

## 2012-09-16 NOTE — Progress Notes (Signed)
Patient ID: Daniel Gonzalez, male   DOB: 1955/08/24, 57 y.o.   MRN: [id number]     [facility]  Patient Active Problem List  Diagnosis  . HIV DISEASE  . GENITAL HERPES  . ERECTILE DYSFUNCTION  . ABUSE, ALCOHOL, EPISODIC  . CIGARETTE SMOKER  . EPISODIC CLUSTER HEADACHE  . CARPAL TUNNEL SYNDROME  . PNEUMOCOCCAL PNEUMONIA  . DIVERTICULOSIS, COLON W/O HEM  . BOILS, RECURRENT  . PILONIDAL CYST WITHOUT MENTION OF ABSCESS  . LOSS OF APPETITE  . FRACTURE, FINGER  . HYPERTENSION NEC  . HEADACHE, CHRONIC, HX OF    Patient's Medications  New Prescriptions   No medications on file  Previous Medications   EMTRICITAB-RILPIVIR-TENOFOVIR (COMPLERA) 200-25-300 MG TABS    Take 1 tablet by mouth once. Must be taken with a medium-sized meal.   SILDENAFIL (VIAGRA) 100 MG TABLET    Take 100 mg by mouth as directed.     VALACYCLOVIR (VALTREX) 500 MG TABLET    Take 500 mg by mouth 2 (two) times daily as needed.     VALSARTAN (DIOVAN) 320 MG TABLET    Take 1 tablet (320 mg total) by mouth daily.   ZIDOVUDINE (RETROVIR) 300 MG TABLET    Take 1 tablet (300 mg total) by mouth 2 (two) times daily.  Modified Medications   No medications on file  Discontinued Medications   SULFAMETHOXAZOLE-TRIMETHOPRIM (SEPTRA DS) 800-160 MG PER TABLET    Take 1 tablet by mouth 2 (two) times daily.    Subjective: Reign is in for his routine visit. He was out of his Complera and Retrovir for 2 weeks recently when he was sorting out his new insurance deductible. Otherwise he has not missed any doses.he continues to smoke cigarettes and has no current plans to quit. However, he did stop drinking alcohol completely last October and thinks he will not restart. He was treated recently with antibiotics for his recurrent infected pilonidal cyst and he is better.  Objective: Temp: 98.3 F (36.8 C) (01/30 1548) Temp src: Oral (01/30 1548) BP: 183/108 mmHg (01/30 1548) Pulse Rate: 75  (01/30  1548)  General: is in good spirits Skin: no rash Lungs: clear Cor: regular S1 and S2 no murmurs  Lab Results HIV 1 RNA Quant (copies/mL)  Date Value  09/02/2012 <20   03/10/2012 <20   09/10/2011 <20      CD4 T Cell Abs (cmm)  Date Value  09/02/2012 820   03/11/2012 880   09/11/2011 1100      Assessment: Despite being out of medication recently his infection remains under excellent control.  Plan: 1. Continue current antiretroviral regimen 2. I encouraged him to consider cigarette cessation 3. I asked him to followup with Dr. Beverely Low for his persistently elevated blood pressure 4. followup here after lab work in 6 months   Cliffton Asters, MD [facility] Infectious Disease [facility] [phone] pager   [phone] cell 09/16/2012, 4:06 PM

## 2012-09-17 NOTE — Telephone Encounter (Signed)
Needs appt early next week to recheck BP so we can adjust meds if needed

## 2012-09-17 NOTE — Telephone Encounter (Signed)
Pt called back today and claried message. Pt indicated that he saw Dr [name] on yesterday and BP was elevated and was advise to f/u with PCP. Pt denies any symptoms yesterday or today. Pt BP was 183/108

## 2012-09-17 NOTE — Telephone Encounter (Signed)
Discuss with patient, labs order appt scheduled.

## 2012-09-22 ENCOUNTER — Encounter: Payer: Self-pay | Admitting: Family Medicine

## 2012-09-22 ENCOUNTER — Ambulatory Visit (INDEPENDENT_AMBULATORY_CARE_PROVIDER_SITE_OTHER): Payer: 59 | Admitting: Family Medicine

## 2012-09-22 VITALS — BP 142/86 | HR 71 | Temp 98.4°F | Wt 143.0 lb

## 2012-09-22 DIAGNOSIS — IMO0002 Reserved for concepts with insufficient information to code with codable children: Secondary | ICD-10-CM

## 2012-09-22 DIAGNOSIS — J309 Allergic rhinitis, unspecified: Secondary | ICD-10-CM | POA: Insufficient documentation

## 2012-09-22 NOTE — Assessment & Plan Note (Signed)
New.  Pt's sxs- congestion, PND, frontal HA, and cough consistent w/ untreated nasal allergies.  Pt to start OTC antihistamine.  Reviewed supportive care and red flags that should prompt return.  Pt expressed understanding and is in agreement w/ plan.

## 2012-09-22 NOTE — Patient Instructions (Addendum)
Follow up as scheduled for your physical in April Restart the Diovan daily Ask HR if I can send a script to [facility]- call me if you want me to Start Claritin or Zyrtec daily for the nasal congestion and headache (store brand is just as good) Call with any questions or concerns Hang in there!

## 2012-09-22 NOTE — Assessment & Plan Note (Signed)
Unchanged.  Pt's BP much better today than at ID previously.  Pt is taking Diovan regularly but missed today's dose due to insurance problem w/ meds.  Pt given coupon card and told to go to pharmacy.  Will send 90 day script to [facility] as soon as I get ok from pt.  Pt expressed understanding and is in agreement w/ plan.

## 2012-09-22 NOTE — Progress Notes (Signed)
  Subjective:    Patient ID: Daniel Gonzalez, male    DOB: 1956/01/22, 57 y.o.   MRN: [id number]  HPI HTN- pt's BP was very high at recent ID visit.  At time of visit was taking cough syrup and excedrin for HAs.  'i didn't feel any different'.  Was taking Diovan up until today b/c when he went to get meds they tried to charge him high amount.  No CP, SOB, visual changes, edema.  + intermittent HAs.  Allergic rhinitis- pt reports nasal congestion, frontal HA- 'different from my other headaches', PND, and cough.  Denies feeling poorly.  No fevers.  Not currently on meds.   Review of Systems For ROS see HPI     Objective:   Physical Exam  Vitals reviewed. Constitutional: He appears well-developed and well-nourished. No distress.  HENT:  Head: Normocephalic and atraumatic.       No TTP over sinuses + turbinate edema + PND TMs normal bilaterally  Eyes: Conjunctivae normal and EOM are normal. Pupils are equal, round, and reactive to light.  Neck: Normal range of motion. Neck supple.  Cardiovascular: Normal rate, regular rhythm and normal heart sounds.   Pulmonary/Chest: Effort normal and breath sounds normal. No respiratory distress. He has no wheezes.  Lymphadenopathy:    He has no cervical adenopathy.  Skin: Skin is warm and dry.          Assessment & Plan:

## 2012-11-10 ENCOUNTER — Other Ambulatory Visit: Payer: Self-pay | Admitting: Internal Medicine

## 2012-11-10 DIAGNOSIS — B20 Human immunodeficiency virus [HIV] disease: Secondary | ICD-10-CM

## 2012-11-10 MED ORDER — ZIDOVUDINE 300 MG PO TABS: 300.0000 mg | ORAL_TABLET | Freq: Two times a day (BID) | ORAL | Status: DC

## 2012-11-19 ENCOUNTER — Encounter: Payer: 59 | Admitting: Family Medicine

## 2013-01-07 ENCOUNTER — Encounter: Payer: Self-pay | Admitting: Internal Medicine

## 2013-01-07 ENCOUNTER — Ambulatory Visit (INDEPENDENT_AMBULATORY_CARE_PROVIDER_SITE_OTHER): Payer: 59 | Admitting: Family Medicine

## 2013-01-07 ENCOUNTER — Encounter: Payer: Self-pay | Admitting: Family Medicine

## 2013-01-07 VITALS — BP 150/88 | HR 72 | Temp 98.2°F | Ht 64.5 in | Wt 138.6 lb

## 2013-01-07 DIAGNOSIS — IMO0002 Reserved for concepts with insufficient information to code with codable children: Secondary | ICD-10-CM

## 2013-01-07 DIAGNOSIS — Z1211 Encounter for screening for malignant neoplasm of colon: Secondary | ICD-10-CM

## 2013-01-07 DIAGNOSIS — Z0000 Encounter for general adult medical examination without abnormal findings: Secondary | ICD-10-CM

## 2013-01-07 LAB — CBC WITH DIFFERENTIAL/PLATELET
Basophils Relative: 0.5 % (ref 0.0–3.0)
Eosinophils Absolute: 0.1 10*3/uL (ref 0.0–0.7)
Eosinophils Relative: 1.9 % (ref 0.0–5.0)
HCT: 40.2 % (ref 39.0–52.0)
Hemoglobin: 13.2 g/dL (ref 13.0–17.0)
Lymphs Abs: 2.9 10*3/uL (ref 0.7–4.0)
MCHC: 32.8 g/dL (ref 30.0–36.0)
MCV: 112.2 fl — ABNORMAL HIGH (ref 78.0–100.0)
Monocytes Absolute: 0.4 10*3/uL (ref 0.1–1.0)
Neutro Abs: 1.5 10*3/uL (ref 1.4–7.7)
Neutrophils Relative %: 31.2 % — ABNORMAL LOW (ref 43.0–77.0)
RBC: 3.58 Mil/uL — ABNORMAL LOW (ref 4.22–5.81)
WBC: 4.9 10*3/uL (ref 4.5–10.5)

## 2013-01-07 LAB — PSA: PSA: 2.41 ng/mL (ref 0.10–4.00)

## 2013-01-07 LAB — TSH: TSH: 0.95 u[IU]/mL (ref 0.35–5.50)

## 2013-01-07 LAB — LIPID PANEL
Cholesterol: 138 mg/dL (ref 0–200)
HDL: 34.5 mg/dL — ABNORMAL LOW (ref >39.00)
VLDL: 12.6 mg/dL (ref 0.0–40.0)

## 2013-01-07 LAB — HEPATIC FUNCTION PANEL
AST: 15 U/L (ref 0–37)
Albumin: 3.8 g/dL (ref 3.5–5.2)
Total Bilirubin: 0.4 mg/dL (ref 0.3–1.2)

## 2013-01-07 LAB — BASIC METABOLIC PANEL
BUN: 11 mg/dL (ref 6–23)
Creatinine, Ser: 1.1 mg/dL (ref 0.4–1.5)
GFR: 91.42 mL/min (ref >60.00)
Glucose, Bld: 88 mg/dL (ref 70–99)

## 2013-01-07 NOTE — Progress Notes (Signed)
  Subjective:    Patient ID: Daniel Gonzalez, male    DOB: 15-Mar-1956, 56 y.o.   MRN: [id number]  HPI CPE- pt reports Bp is elevated today b/c 'i'm kinda stressed.  i got IRS problems'.  Also hasn't taken meds yet today- normally takes around 10am.  No concerns today.  Colonoscopy- pt thinks it was 10 yrs ago.  Cannot remember name of MD.   Review of Systems Patient reports no vision/hearing changes, anorexia, fever ,adenopathy, persistant/recurrent hoarseness, swallowing issues, chest pain, palpitations, edema, persistant/recurrent cough, hemoptysis, dyspnea (rest,exertional, paroxysmal nocturnal), gastrointestinal  bleeding (melena, rectal bleeding), abdominal pain, excessive heart burn, GU symptoms (dysuria, hematuria, voiding/incontinence issues) syncope, focal weakness, memory loss, skin/hair/nail changes, depression, anxiety, abnormal bruising/bleeding, musculoskeletal symptoms/signs.   + numbness due to carpal tunnel    Objective:   Physical Exam BP 150/88  Pulse 72  Temp(Src) 98.2 F (36.8 C) (Oral)  Ht 5' 4.5" (1.638 m)  Wt 138 lb 9.6 oz (62.869 kg)  BMI 23.43 kg/m2  SpO2 98%  General Appearance:    Alert, cooperative, no distress, appears stated age  Head:    Normocephalic, without obvious abnormality, atraumatic  Eyes:    PERRL, conjunctiva/corneas clear, EOM's intact, fundi    benign, both eyes       Ears:    Normal TM's and external ear canals, both ears  Nose:   Nares normal, septum midline, mucosa normal, no drainage   or sinus tenderness  Throat:   Lips, mucosa, and tongue normal; teeth and gums normal  Neck:   Supple, symmetrical, trachea midline, no adenopathy;       thyroid:  No enlargement/tenderness/nodules  Back:     Symmetric, no curvature, ROM normal, no CVA tenderness  Lungs:     Clear to auscultation bilaterally, respirations unlabored  Chest wall:    No tenderness or deformity  Heart:    Regular rate and rhythm, S1 and S2 normal, no murmur, rub   or  gallop  Abdomen:     Soft, non-tender, bowel sounds active all four quadrants,    no masses, no organomegaly  Genitalia:    Normal male without lesion, discharge or tenderness  Rectal:    Deferred to colonoscopy and PSA  Extremities:   Extremities normal, atraumatic, no cyanosis or edema  Pulses:   2+ and symmetric all extremities  Skin:   Skin color, texture, turgor normal, no rashes or lesions  Lymph nodes:   Cervical, supraclavicular, and axillary nodes normal  Neurologic:   CNII-XII intact. Normal strength, sensation and reflexes      throughout          Assessment & Plan:

## 2013-01-07 NOTE — Assessment & Plan Note (Signed)
Pt's PE WNL w/ exception of elevated BP.  Pt cannot remember date of last colonoscopy- will refer.  EKG done- see document for interpretation.  Check labs.  Anticipatory guidance provided.

## 2013-01-07 NOTE — Assessment & Plan Note (Signed)
BP elevated- pt reports increased stress and has not yet taken meds.  Will follow closely.

## 2013-01-07 NOTE — Patient Instructions (Addendum)
Follow up in 4-6 weeks to recheck BP We'll notify you of your lab results and make any changes if needed Try and quit smoking Schedule a dental appt Call with any questions or concerns Happy Memorial Day!!!

## 2013-01-26 ENCOUNTER — Encounter: Payer: Self-pay | Admitting: Internal Medicine

## 2013-01-26 ENCOUNTER — Ambulatory Visit (AMBULATORY_SURGERY_CENTER): Payer: 59 | Admitting: *Deleted

## 2013-01-26 VITALS — Ht 64.0 in | Wt 141.0 lb

## 2013-01-26 DIAGNOSIS — Z1211 Encounter for screening for malignant neoplasm of colon: Secondary | ICD-10-CM

## 2013-01-26 MED ORDER — MOVIPREP 100 G PO SOLR
ORAL | Status: DC
Start: 2013-01-26 — End: 2013-03-04

## 2013-01-28 ENCOUNTER — Telehealth: Payer: Self-pay | Admitting: *Deleted

## 2013-01-28 NOTE — Telephone Encounter (Signed)
Dr Rhea Belton;  Pt scheduled for direct colonoscopy in 1 week. (Friday 6/20).  Pt says last colonoscopy was in 2009 but he does not know where, who performed procedure, or what findings were.  He thought he was due now for another colonoscopy now.  He has checked with his PCP and they have not been able to locate any records.  Please advise.  Thanks, Olegario Messier in [place].

## 2013-01-28 NOTE — Telephone Encounter (Signed)
Talked with pt:  Pt says colonoscopy in 2009 was in [place].  I called Dr. Jennye Boroughs office, Dr. Kenna Gilbert office and [facility].  No records of pt having colonoscopy in any of these offices.  Colonoscopy with Dr. Rhea Belton scheduled for 6/20 cancelled and new pt appt scheduled with Dr. Rhea Belton for 7/18 at 9:45.  Pt aware of procedure being cancelled and new pt appt time with Dr. Rhea Belton.

## 2013-01-28 NOTE — Telephone Encounter (Signed)
Dr Christella Hartigan: I am forwarding this note to you as doc of the day: Dr. Rhea Belton will not be back until next Wed 6/18 and pt's procedure is scheduled for 6/20.  Please advise.  Thanks, Olegario Messier

## 2013-01-28 NOTE — Telephone Encounter (Signed)
Please cancel the upcoming colonoscopy and instead schedule him for new GI appt to discuss colon cancer screening.  From that appt we may be able to better track down previous colonoscopies, path results etc.  Ask patient if the colonoscopy was done here in [place]; if YES, then please call [facility], Dr. Kenna Gilbert office and Dr. Jennye Boroughs office to see if they have any records.

## 2013-02-03 ENCOUNTER — Encounter: Payer: 59 | Admitting: Internal Medicine

## 2013-02-04 ENCOUNTER — Encounter: Payer: 59 | Admitting: Internal Medicine

## 2013-02-09 ENCOUNTER — Other Ambulatory Visit: Payer: Self-pay | Admitting: Family Medicine

## 2013-02-09 DIAGNOSIS — I10 Essential (primary) hypertension: Secondary | ICD-10-CM

## 2013-02-10 NOTE — Telephone Encounter (Signed)
Due for follw up

## 2013-02-11 ENCOUNTER — Ambulatory Visit (INDEPENDENT_AMBULATORY_CARE_PROVIDER_SITE_OTHER): Payer: 59 | Admitting: Family Medicine

## 2013-02-11 ENCOUNTER — Encounter: Payer: Self-pay | Admitting: Family Medicine

## 2013-02-11 VITALS — BP 120/98 | HR 74 | Temp 98.1°F | Ht 64.5 in | Wt 138.0 lb

## 2013-02-11 DIAGNOSIS — R319 Hematuria, unspecified: Secondary | ICD-10-CM

## 2013-02-11 DIAGNOSIS — J309 Allergic rhinitis, unspecified: Secondary | ICD-10-CM

## 2013-02-11 DIAGNOSIS — IMO0002 Reserved for concepts with insufficient information to code with codable children: Secondary | ICD-10-CM

## 2013-02-11 DIAGNOSIS — R310 Gross hematuria: Secondary | ICD-10-CM

## 2013-02-11 DIAGNOSIS — I10 Essential (primary) hypertension: Secondary | ICD-10-CM

## 2013-02-11 LAB — POCT URINALYSIS DIPSTICK
Glucose, UA: NEGATIVE
Leukocytes, UA: NEGATIVE
Nitrite, UA: NEGATIVE
Protein, UA: NEGATIVE
Urobilinogen, UA: 0.2

## 2013-02-11 MED ORDER — FLUTICASONE PROPIONATE 50 MCG/ACT NA SUSP: 2.0000 | Freq: Every day | NASAL | Status: DC

## 2013-02-11 MED ORDER — VALSARTAN 320 MG PO TABS: 320.0000 mg | ORAL_TABLET | Freq: Every day | ORAL | Status: DC

## 2013-02-11 NOTE — Progress Notes (Signed)
  Subjective:    Patient ID: Daniel Gonzalez, male    DOB: 1955-09-28, 57 y.o.   MRN: [id number]  HPI HTN- chronic problem, BP better controlled today.  On Diovan 320mg .  No CP, SOB, HAs, visual changes, edema.  Hematuria- noted x1 on Tuesday.  No pain.  No burning w/ urination, frequency, urgency.  Allergic rhinitis- worsened this weekend after cutting grass.  Improved w/ use of benadryl.  Plans to restart claritin or zyrtec daily.     Review of Systems For ROS see HPI      Objective:   Physical Exam  Vitals reviewed. Constitutional: He appears well-developed and well-nourished. No distress.  HENT:  Head: Normocephalic and atraumatic.  No TTP over sinuses + turbinate edema + PND TMs normal bilaterally  Eyes: Conjunctivae and EOM are normal. Pupils are equal, round, and reactive to light.  Neck: Normal range of motion. Neck supple.  Cardiovascular: Normal rate, regular rhythm and normal heart sounds.   Pulmonary/Chest: Effort normal and breath sounds normal. No respiratory distress. He has no wheezes.  Musculoskeletal: He exhibits no edema.  Lymphadenopathy:    He has no cervical adenopathy.  Skin: Skin is warm and dry.          Assessment & Plan:

## 2013-02-11 NOTE — Assessment & Plan Note (Signed)
Better controlled today.  Asymptomatic.  No changes.  Will continue to follow closely.  Again recommended smoking cessation.

## 2013-02-11 NOTE — Assessment & Plan Note (Signed)
New.  1 episode.  UA clear today.  Encouraged pt that if sxs return, he is to call and we'll refer to urology.  Pt expressed understanding and is in agreement w/ plan.

## 2013-02-11 NOTE — Assessment & Plan Note (Signed)
Deteriorated.  Add nasal steroid to OTC antihistamine.  Pt expressed understanding and is in agreement w/ plan.

## 2013-02-11 NOTE — Patient Instructions (Addendum)
Follow up in November to recheck BP Continue the allergy pills Add the nasal spray- 2 sprays each nostril- daily If you again have blood in your urine, please call me! Call with any questions or concerns Have a great summer!

## 2013-02-25 ENCOUNTER — Encounter: Payer: Self-pay | Admitting: Internal Medicine

## 2013-03-04 ENCOUNTER — Ambulatory Visit (INDEPENDENT_AMBULATORY_CARE_PROVIDER_SITE_OTHER): Payer: 59 | Admitting: Internal Medicine

## 2013-03-04 ENCOUNTER — Encounter: Payer: Self-pay | Admitting: Internal Medicine

## 2013-03-04 VITALS — BP 160/96 | HR 60 | Ht 64.25 in | Wt 136.0 lb

## 2013-03-04 DIAGNOSIS — Z1211 Encounter for screening for malignant neoplasm of colon: Secondary | ICD-10-CM

## 2013-03-04 NOTE — Progress Notes (Signed)
Patient ID: Daniel Gonzalez, male   DOB: 1955-12-22, 57 y.o.   MRN: [id number] HPI: Daniel Gonzalez is a 57 yo male with PMH of HIV on antiretrovirals, HTN who is seen in consultation at the request of Dr. Beverely Low for evaluation of colon cancer screening.  Pt is alone today.  He reports he is feeling well today.  He denies current complaints including no abdominal pain, change in bowel habits, rectal bleeding or melena. He denies diarrhea or constipation. No issue with dysphagia, significant heartburn, nausea or vomiting.  He recalls colonoscopy in 2009 performed for heme-positive stools. It seems that upper endoscopy was also performed during this time. Initially he felt like polyps were removed, but he feels that this was from the upper endoscopy and not the colonoscopy. He reports the colonoscopy was performed and [facility], but is unsure where.  No family history of colon polyps or cancer to his knowledge  Patient Active Problem List   Diagnosis Date Noted  . Hematuria, gross 02/11/2013  . Routine general medical examination at a health care facility 01/07/2013  . Allergic rhinitis 09/22/2012  . 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 HYPERTENSION NEC 05/01/2010  . CARPAL TUNNEL SYNDROME 01/02/2010  . HEADACHE, CHRONIC, HX OF 01/02/2009  . LOSS OF APPETITE 10/03/2008  . PILONIDAL CYST WITHOUT MENTION OF ABSCESS 05/29/2008  . HIV DISEASE 09/17/2006  . GENITAL HERPES 09/17/2006  . ERECTILE DYSFUNCTION 09/17/2006  . 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 CIGARETTE SMOKER 09/17/2006  . PNEUMOCOCCAL PNEUMONIA 09/17/2006  . DIVERTICULOSIS, COLON W/O HEM 09/17/2006  . BOILS, RECURRENT 09/17/2006  . FRACTURE, FINGER 09/17/2006    Past Surgical History  Procedure Laterality Date  . Cyst in back      removed from back    Current Outpatient Prescriptions  Medication Sig Dispense Refill  . COMPLERA 200-25-300 MG TABS Take 1 tablet by mouth once a day . Must be taken with  a medium-sized  meal.  90 tablet  4  . fluticasone (FLONASE) 50 MCG/ACT nasal spray Place 2 sprays into the nose as needed.      . sildenafil (VIAGRA) 100 MG tablet Take 100 mg by mouth as directed.        . valsartan (DIOVAN) 320 MG tablet Take 1 tablet (320 mg total) by mouth daily.  30 tablet  6  . zidovudine (RETROVIR) 300 MG tablet Take 1 tablet (300 mg total) by mouth 2 (two) times daily.  180 tablet  4  . valACYclovir (VALTREX) 500 MG tablet Take 500 mg by mouth 2 (two) times daily as needed.         No current facility-administered medications for this visit.    Allergies  Allergen Reactions  . Sulfamethoxazole-Tmp Ds Itching    Family History  Problem Relation Age of Onset  . Arthritis Mother   . Heart disease Mother   . Hypertension Mother   . Diabetes Mother   . Arthritis Father   . Heart disease Father   . Hypertension Father   . Colon cancer Neg Hx   . Lung cancer Brother     History  Substance Use Topics  . Smoking status: Current Every Day Smoker -- 0.40 packs/day for 35 years    Types: Cigarettes  . Smokeless tobacco: Never Used  . Alcohol Use: 1.0 oz/week    2 drink(s) per week     Comment: ocassional    ROS: As per history of present illness, otherwise negative  BP 160/96  Pulse  60  Ht 5' 4.25" (1.632 m)  Wt 136 lb (61.689 kg)  BMI 23.16 kg/m2 Constitutional: Well-developed and well-nourished. No distress. HEENT: Normocephalic and atraumatic. No scleral icterus. Neck: Neck supple. Trachea midline. Cardiovascular: Normal rate, regular rhythm and intact distal pulses. No M/R/G Pulmonary/chest: Effort normal and breath sounds normal. No wheezing, rales or rhonchi. Abdominal: Soft, nontender, nondistended. Bowel sounds active throughout.  Extremities: no clubbing, cyanosis, or edema Lymphadenopathy: No cervical adenopathy noted. Neurological: Alert and oriented to person place and time. Skin: Skin is warm and dry. No rashes noted. Psychiatric: Normal mood and  affect. Behavior is normal.  RELEVANT LABS AND IMAGING: CBC    Component Value Date/Time   WBC 4.9 01/07/2013 0903   RBC 3.58* 01/07/2013 0903   HGB 13.2 01/07/2013 0903   HCT 40.2 01/07/2013 0903   PLT 230.0 01/07/2013 0903   MCV 112.2* 01/07/2013 0903   MCH 37.2* 09/02/2012 1558   MCHC 32.8 01/07/2013 0903   RDW 17.1* 01/07/2013 0903   LYMPHSABS 2.9 01/07/2013 0903   MONOABS 0.4 01/07/2013 0903   EOSABS 0.1 01/07/2013 0903   BASOSABS 0.0 01/07/2013 0903    CMP     Component Value Date/Time   NA 138 01/07/2013 0903   K 3.6 01/07/2013 0903   CL 108 01/07/2013 0903   CO2 24 01/07/2013 0903   GLUCOSE 88 01/07/2013 0903   BUN 11 01/07/2013 0903   CREATININE 1.1 01/07/2013 0903   CREATININE 0.83 09/02/2012 1558   CALCIUM 9.3 01/07/2013 0903   PROT 6.9 01/07/2013 0903   ALBUMIN 3.8 01/07/2013 0903   AST 15 01/07/2013 0903   ALT 12 01/07/2013 0903   ALKPHOS 60 01/07/2013 0903   BILITOT 0.4 01/07/2013 0903   GFRNONAA >60 06/18/2010 1300   GFRAA  Value: >60        The eGFR has been calculated using the MDRD equation. This calculation has not been validated in all clinical situations. eGFR's persistently <60 mL/min signify possible Chronic Kidney Disease. 06/18/2010 1300    ASSESSMENT/PLAN:  58 yo male with PMH of HIV on antiretrovirals, HTN who is seen in consultation at the request of Dr. Beverely Low for evaluation of colon cancer screening.  1.  CRC screening -- he recalls previous colonoscopy in 2009. His recollection is accurate and no polyps were found, and the prep was adequate into the cecum, he would not be due for repeat colonoscopy for another 5 years. We will attempt to obtain this colonoscopy record by calling other GI practices in town. If we are unable to obtain these records, I recommend annual FOBT testing starting now until 2019. If FOBT is ever positive he would need colonoscopy at that time.  If FOBT remains negative, I would repeat colonoscopy for screening in 2019. All the above is assuming  no other symptoms which would drive a diagnostic colonoscopy before that time. He understands and is agreeable with this plan.  He will call if any problems or concerns. If we cannot obtain colonoscopy record, he will perform fecal occult blood testing

## 2013-03-04 NOTE — Patient Instructions (Addendum)
You have been given a separate informational sheet regarding your tobacco use, the importance of quitting and local resources to help you quit.   We are going to try to find where you had your last colonoscopy. If we cannot locate your records Dr. Rhea Belton would like you to send in FOBT cards which you will get from our lab in the basement. I will call you on Monday to let you know if we have located them                                               We are excited to introduce MyChart, a new best-in-class service that provides you online access to important information in your electronic medical record. We want to make it easier for you to view your health information - all in one secure location - when and where you need it. We expect MyChart will enhance the quality of care and service we provide.  When you register for MyChart, you can:    View your test results.    Request appointments and receive appointment reminders via email.    Request medication renewals.    View your medical history, allergies, medications and immunizations.    Communicate with your physician's office through a password-protected site.    Conveniently print information such as your medication lists.  To find out if MyChart is right for you, please talk to a member of our clinical staff today. We will gladly answer your questions about this free health and wellness tool.  If you are age 64 or older and want a member of your family to have access to your record, you must provide written consent by completing a proxy form available at our office. Please speak to our clinical staff about guidelines regarding accounts for patients younger than age 23.  As you activate your MyChart account and need any technical assistance, please call the MyChart technical support line at [phone]) or email your question to [email or web address]. If you email your question(s), please include your name, a return  phone number and the best time to reach you.  If you have non-urgent health-related questions, you can send a message to our office through MyChart at [email or web address]. If you have a medical emergency, call 911.  Thank you for using MyChart as your new health and wellness resource!   MyChart licensed from [employer],  [id number]. Patents Pending.

## 2013-03-14 ENCOUNTER — Other Ambulatory Visit: Payer: Self-pay | Admitting: Family Medicine

## 2013-03-17 NOTE — Telephone Encounter (Signed)
Rx sent to the pharmacy by e-script.//AB/CMA 

## 2013-03-24 ENCOUNTER — Other Ambulatory Visit: Payer: Self-pay | Admitting: Licensed Clinical Social Worker

## 2013-03-24 DIAGNOSIS — B20 Human immunodeficiency virus [HIV] disease: Secondary | ICD-10-CM

## 2013-03-24 MED ORDER — ZIDOVUDINE 300 MG PO TABS: 300.0000 mg | ORAL_TABLET | Freq: Two times a day (BID) | ORAL | Status: DC

## 2013-03-29 ENCOUNTER — Other Ambulatory Visit: Payer: Self-pay | Admitting: Licensed Clinical Social Worker

## 2013-03-29 DIAGNOSIS — B20 Human immunodeficiency virus [HIV] disease: Secondary | ICD-10-CM

## 2013-03-29 MED ORDER — ZIDOVUDINE 300 MG PO TABS: 300.0000 mg | ORAL_TABLET | Freq: Two times a day (BID) | ORAL | Status: DC

## 2013-05-13 ENCOUNTER — Other Ambulatory Visit: Payer: 59

## 2013-05-13 ENCOUNTER — Ambulatory Visit (INDEPENDENT_AMBULATORY_CARE_PROVIDER_SITE_OTHER): Payer: 59 | Admitting: Licensed Clinical Social Worker

## 2013-05-13 DIAGNOSIS — Z23 Encounter for immunization: Secondary | ICD-10-CM

## 2013-05-13 DIAGNOSIS — B20 Human immunodeficiency virus [HIV] disease: Secondary | ICD-10-CM

## 2013-05-13 LAB — T-HELPER CELL (CD4) - (RCID CLINIC ONLY)
CD4 % Helper T Cell: 30 % — ABNORMAL LOW (ref 33–55)
CD4 T Cell Abs: 860 /uL (ref 400–2700)

## 2013-06-06 ENCOUNTER — Encounter: Payer: Self-pay | Admitting: Internal Medicine

## 2013-06-06 ENCOUNTER — Ambulatory Visit (INDEPENDENT_AMBULATORY_CARE_PROVIDER_SITE_OTHER): Payer: 59 | Admitting: Internal Medicine

## 2013-06-06 VITALS — BP 171/97 | HR 64 | Temp 98.3°F | Ht 64.0 in | Wt 139.0 lb

## 2013-06-06 DIAGNOSIS — B20 Human immunodeficiency virus [HIV] disease: Secondary | ICD-10-CM

## 2013-06-06 NOTE — Progress Notes (Signed)
Patient ID: Daniel Gonzalez, male   DOB: 04/25/1956, 57 y.o.   MRN: [id number]          [facility]  Patient Active Problem List   Diagnosis Date Noted  . Hematuria, gross 02/11/2013  . Routine general medical examination at a health care facility 01/07/2013  . Allergic rhinitis 09/22/2012  . 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 HYPERTENSION NEC 05/01/2010  . CARPAL TUNNEL SYNDROME 01/02/2010  . HEADACHE, CHRONIC, HX OF 01/02/2009  . LOSS OF APPETITE 10/03/2008  . PILONIDAL CYST WITHOUT MENTION OF ABSCESS 05/29/2008  . HIV DISEASE 09/17/2006  . GENITAL HERPES 09/17/2006  . ERECTILE DYSFUNCTION 09/17/2006  . 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 CIGARETTE SMOKER 09/17/2006  . PNEUMOCOCCAL PNEUMONIA 09/17/2006  . DIVERTICULOSIS, COLON W/O HEM 09/17/2006  . BOILS, RECURRENT 09/17/2006  . FRACTURE, FINGER 09/17/2006    Patient's Medications  New Prescriptions   No medications on file  Previous Medications   COMPLERA 200-25-300 MG TABS    Take 1 tablet by mouth once a day . Must be taken with  a medium-sized meal.   FLUTICASONE (FLONASE) 50 MCG/ACT NASAL SPRAY    Place 2 sprays into the nose as needed.   SILDENAFIL (VIAGRA) 100 MG TABLET    Take 100 mg by mouth as directed.     VALACYCLOVIR (VALTREX) 500 MG TABLET    Take 500 mg by mouth 2 (two) times daily as needed.     VALSARTAN (DIOVAN) 320 MG TABLET    Take 1 tablet (320 mg total) by mouth daily.   ZIDOVUDINE (RETROVIR) 300 MG TABLET    Take 1 tablet (300 mg total) by mouth 2 (two) times daily.  Modified Medications   No medications on file  Discontinued Medications   VALSARTAN (DIOVAN) 320 MG TABLET    Take 1 tablet by mouth daily    Subjective: Daniel Gonzalez is in for his routine visit. He denies missing a single dose of his Complera or Retrovir since his last visit. He continues to smoke cigarettes and has no current plan to quit. He is taking his valsartan and is concerned that his blood  pressure remains elevated today. He has a follow up visit scheduled with his primary care physician, Dr. Beverely Low, in early November. He received his influenza vaccination here 2 weeks ago.  Review of Systems: Pertinent items are noted in HPI.  Past Medical History  Diagnosis Date  . Cluster headache   . Blood in stool   . Migraine   . Frequent episodic tension-type headache   . Hypertension   . HIV infection   . Pneumonia     History  Substance Use Topics  . Smoking status: Current Every Day Smoker -- 0.75 packs/day for 35 years    Types: Cigarettes  . Smokeless tobacco: Never Used  . Alcohol Use: 0.0 oz/week    0 drink(s) per week     Comment: no fun drinking by yourself    Family History  Problem Relation Age of Onset  . Arthritis Mother   . Heart disease Mother   . Hypertension Mother   . Diabetes Mother   . Arthritis Father   . Heart disease Father   . Hypertension Father   . Colon cancer Neg Hx   . Lung cancer Brother     Allergies  Allergen Reactions  . Sulfamethoxazole-Tmp Ds Itching  . Asa [Aspirin] Palpitations    Speeds up heart rate    Objective: Temp:  98.3 F (36.8 C) (10/20 1612) Temp src: Oral (10/20 1612) BP: 171/97 mmHg (10/20 1612) Pulse Rate: 64 (10/20 1612)  General: He is in good spirits Oral: He has some mild hypopigmentation at the corner of his mouth bilaterally without any fissuring or other specific lesions. He has no oropharyngeal lesions but he does have some gray pigmentation on his buccal mucosa. He has full upper dentures and a lower partial plate. Skin: No rash Lungs: Clear Cor: Regular S1 and S2 no murmurs Mood and affect: Normal  Lab Results HIV 1 RNA Quant (copies/mL)  Date Value  05/13/2013 <20   09/02/2012 <20   03/10/2012 <20      CD4 T Cell Abs (/uL)  Date Value  05/13/2013 860   09/02/2012 820   03/11/2012 880      Assessment: His HIV infection remains under excellent control. I will continue his current  regimen.  His blood pressure remains elevated. He is a strong family history of early heart disease, strokes and kidney failure. He does seem motivated to get his blood pressure to goal.  I encouraged him to stop smoking cigarettes completely.  Plan: 1. Continue current antiretroviral regimen 2. Primary care followup early next month 3. Followup here after lab work in 6 months   Cliffton Asters, MD [facility] [phone] pager   [phone] cell 06/06/2013, 4:32 PM

## 2013-06-20 ENCOUNTER — Ambulatory Visit (INDEPENDENT_AMBULATORY_CARE_PROVIDER_SITE_OTHER): Payer: 59 | Admitting: Family Medicine

## 2013-06-20 ENCOUNTER — Encounter: Payer: Self-pay | Admitting: Family Medicine

## 2013-06-20 VITALS — BP 180/98 | HR 66 | Temp 98.1°F | Resp 16 | Wt 141.4 lb

## 2013-06-20 DIAGNOSIS — L819 Disorder of pigmentation, unspecified: Secondary | ICD-10-CM

## 2013-06-20 DIAGNOSIS — IMO0002 Reserved for concepts with insufficient information to code with codable children: Secondary | ICD-10-CM

## 2013-06-20 DIAGNOSIS — G43909 Migraine, unspecified, not intractable, without status migrainosus: Secondary | ICD-10-CM

## 2013-06-20 MED ORDER — KETOROLAC TROMETHAMINE 60 MG/2ML IM SOLN
60.0000 mg | Freq: Once | INTRAMUSCULAR | Status: AC
  Administered 2013-06-20: 60 mg via INTRAMUSCULAR

## 2013-06-20 MED ORDER — NEBIVOLOL HCL 10 MG PO TABS: 10.0000 mg | ORAL_TABLET | Freq: Every day | ORAL | Status: DC

## 2013-06-20 NOTE — Progress Notes (Signed)
  Subjective:    Patient ID: Daniel Gonzalez, male    DOB: [birth year], 57 y.o.   MRN: [id number]  HPI HTN- chronic problem for pt.  On Diovan daily.  Did not tolerate HCTZ previously due to side effects.  Friday developed severe headache, visual changes.  No nausea.  No CP, SOB.  Has hx of migraines.  Initially HA was on L sided.  HA now covers the top of head.  Pt uncertain if BP caused HA or HA caused elevated BP.  Discoloration of corners of mouth- denies pain, cracking, burning, peeling.  Saw dentist and was told not to worry but pt is worried b/c this is new for him.   Review of Systems For ROS see HPI     Objective:   Physical Exam  Vitals reviewed. Constitutional: He is oriented to person, place, and time. He appears well-developed and well-nourished. No distress.  HENT:  Head: Normocephalic and atraumatic.  Mouth/Throat: Mucous membranes are normal.  Eyes: Conjunctivae and EOM are normal. Pupils are equal, round, and reactive to light.  Neck: Normal range of motion. Neck supple.  Cardiovascular: Normal rate, regular rhythm and normal heart sounds.   Pulmonary/Chest: Effort normal and breath sounds normal. No respiratory distress. He has no wheezes. He has no rales.  Lymphadenopathy:    He has no cervical adenopathy.  Neurological: He is alert and oriented to person, place, and time.  Skin: Skin is warm and dry.  Psychiatric: He has a normal mood and affect. His behavior is normal. Thought content normal.          Assessment & Plan:

## 2013-06-20 NOTE — Patient Instructions (Signed)
Follow up in 1 week to recheck BP Continue the Diovan Add the Bystolic daily Limit your salt intake Drink plenty of water Take tylenol/advil as needed for headache We'll call you with your dermatology appt Call with any questions or concerns- particularly if headache worsens Hang in there!!!

## 2013-06-21 ENCOUNTER — Telehealth: Payer: Self-pay | Admitting: *Deleted

## 2013-06-21 NOTE — Assessment & Plan Note (Signed)
New.  Has seen dentist and does not appear to have angular chelitis or fungal infxn.  This is very bothersome to pt- refer to derm.

## 2013-06-21 NOTE — Assessment & Plan Note (Signed)
New to provider, pt w/ hx of similar.  toradol injxn for pain.  Start beta blocker both as BP med and migraine prophylaxis.  Reviewed supportive care and red flags that should prompt return.  Pt expressed understanding and is in agreement w/ plan.

## 2013-06-21 NOTE — Assessment & Plan Note (Signed)
BP very elevated today.  + HA.  Pt did not tolerate HCTZ previously.  Add Bystolic, limit salt intake, tx presumed migraine and monitor for BP improvement.  Reviewed supportive care and red flags that should prompt return.  Pt expressed understanding and is in agreement w/ plan.

## 2013-06-21 NOTE — Telephone Encounter (Signed)
Patient left message on triage line stating that "I feel like my blood is up, I am seeing spots". I called the patient back on the cell number given but it went directly to voice mail, left a message that he needed to be seen today and to call us back to schedule that appt. Home number on record rang twice then went to a fast busy signal..

## 2013-06-22 ENCOUNTER — Inpatient Hospital Stay (HOSPITAL_COMMUNITY)
Admission: EM | Admit: 2013-06-22 | Discharge: 2013-06-25 | DRG: 064 | Disposition: A | Discharge status: Home Health | Payer: 59 | Attending: Internal Medicine | Admitting: Internal Medicine

## 2013-06-22 ENCOUNTER — Ambulatory Visit (HOSPITAL_COMMUNITY)
Admission: RE | Admit: 2013-06-22 | Discharge: 2013-06-22 | Disposition: A | Discharge status: Home / Self Care | Payer: 59 | Source: Ambulatory Visit | Attending: Family Medicine | Admitting: Family Medicine

## 2013-06-22 ENCOUNTER — Inpatient Hospital Stay (HOSPITAL_COMMUNITY): Payer: 59

## 2013-06-22 ENCOUNTER — Encounter: Payer: Self-pay | Admitting: Family Medicine

## 2013-06-22 ENCOUNTER — Ambulatory Visit (INDEPENDENT_AMBULATORY_CARE_PROVIDER_SITE_OTHER): Payer: 59 | Admitting: Family Medicine

## 2013-06-22 ENCOUNTER — Encounter (HOSPITAL_COMMUNITY): Payer: Self-pay | Admitting: Emergency Medicine

## 2013-06-22 VITALS — BP 138/80 | HR 58 | Temp 98.2°F | Resp 16 | Wt 140.2 lb

## 2013-06-22 DIAGNOSIS — G43909 Migraine, unspecified, not intractable, without status migrainosus: Secondary | ICD-10-CM

## 2013-06-22 DIAGNOSIS — R42 Dizziness and giddiness: Secondary | ICD-10-CM | POA: Insufficient documentation

## 2013-06-22 DIAGNOSIS — I6350 Cerebral infarction due to unspecified occlusion or stenosis of unspecified cerebral artery: Secondary | ICD-10-CM | POA: Insufficient documentation

## 2013-06-22 DIAGNOSIS — Z8673 Personal history of transient ischemic attack (TIA), and cerebral infarction without residual deficits: Secondary | ICD-10-CM | POA: Diagnosis present

## 2013-06-22 DIAGNOSIS — J309 Allergic rhinitis, unspecified: Secondary | ICD-10-CM

## 2013-06-22 DIAGNOSIS — R630 Anorexia: Secondary | ICD-10-CM

## 2013-06-22 DIAGNOSIS — K5730 Diverticulosis of large intestine without perforation or abscess without bleeding: Secondary | ICD-10-CM

## 2013-06-22 DIAGNOSIS — F1010 Alcohol abuse, uncomplicated: Secondary | ICD-10-CM

## 2013-06-22 DIAGNOSIS — L0292 Furuncle, unspecified: Secondary | ICD-10-CM

## 2013-06-22 DIAGNOSIS — S62609A Fracture of unspecified phalanx of unspecified finger, initial encounter for closed fracture: Secondary | ICD-10-CM

## 2013-06-22 DIAGNOSIS — R51 Headache: Secondary | ICD-10-CM | POA: Insufficient documentation

## 2013-06-22 DIAGNOSIS — IMO0002 Reserved for concepts with insufficient information to code with codable children: Secondary | ICD-10-CM

## 2013-06-22 DIAGNOSIS — B20 Human immunodeficiency virus [HIV] disease: Secondary | ICD-10-CM

## 2013-06-22 DIAGNOSIS — G319 Degenerative disease of nervous system, unspecified: Secondary | ICD-10-CM | POA: Insufficient documentation

## 2013-06-22 DIAGNOSIS — Z0000 Encounter for general adult medical examination without abnormal findings: Secondary | ICD-10-CM

## 2013-06-22 DIAGNOSIS — L0591 Pilonidal cyst without abscess: Secondary | ICD-10-CM

## 2013-06-22 DIAGNOSIS — A6000 Herpesviral infection of urogenital system, unspecified: Secondary | ICD-10-CM

## 2013-06-22 DIAGNOSIS — Z9189 Other specified personal risk factors, not elsewhere classified: Secondary | ICD-10-CM

## 2013-06-22 DIAGNOSIS — F17200 Nicotine dependence, unspecified, uncomplicated: Secondary | ICD-10-CM

## 2013-06-22 DIAGNOSIS — H539 Unspecified visual disturbance: Secondary | ICD-10-CM

## 2013-06-22 DIAGNOSIS — I10 Essential (primary) hypertension: Secondary | ICD-10-CM

## 2013-06-22 DIAGNOSIS — F528 Other sexual dysfunction not due to a substance or known physiological condition: Secondary | ICD-10-CM

## 2013-06-22 DIAGNOSIS — I639 Cerebral infarction, unspecified: Secondary | ICD-10-CM

## 2013-06-22 DIAGNOSIS — R310 Gross hematuria: Secondary | ICD-10-CM

## 2013-06-22 DIAGNOSIS — L819 Disorder of pigmentation, unspecified: Secondary | ICD-10-CM

## 2013-06-22 DIAGNOSIS — J3489 Other specified disorders of nose and nasal sinuses: Secondary | ICD-10-CM | POA: Insufficient documentation

## 2013-06-22 DIAGNOSIS — J13 Pneumonia due to Streptococcus pneumoniae: Secondary | ICD-10-CM

## 2013-06-22 DIAGNOSIS — F1210 Cannabis abuse, uncomplicated: Secondary | ICD-10-CM | POA: Diagnosis present

## 2013-06-22 LAB — CBC WITH DIFFERENTIAL/PLATELET
Eosinophils Absolute: 0.1 10*3/uL (ref 0.0–0.7)
Hemoglobin: 13.6 g/dL (ref 13.0–17.0)
Lymphocytes Relative: 67 % — ABNORMAL HIGH (ref 12–46)
Lymphs Abs: 3.3 10*3/uL (ref 0.7–4.0)
MCH: 38.4 pg — ABNORMAL HIGH (ref 26.0–34.0)
Monocytes Relative: 8 % (ref 3–12)
Neutro Abs: 1.1 10*3/uL — ABNORMAL LOW (ref 1.7–7.7)
Neutrophils Relative %: 23 % — ABNORMAL LOW (ref 43–77)
RBC: 3.54 MIL/uL — ABNORMAL LOW (ref 4.22–5.81)
WBC: 4.9 10*3/uL (ref 4.0–10.5)

## 2013-06-22 LAB — BASIC METABOLIC PANEL
GFR calc Af Amer: >90 mL/min (ref >90)
GFR calc non Af Amer: 83 mL/min — ABNORMAL LOW (ref >90)
Glucose, Bld: 85 mg/dL (ref 70–99)
Potassium: 4 mEq/L (ref 3.5–5.1)
Sodium: 137 mEq/L (ref 135–145)

## 2013-06-22 LAB — PROTIME-INR
INR: 1 (ref 0.00–1.49)
Prothrombin Time: 13 seconds (ref 11.6–15.2)

## 2013-06-22 LAB — CREATININE, SERUM
Creatinine, Ser: 1.11 mg/dL (ref 0.50–1.35)
GFR calc Af Amer: 85 mL/min — ABNORMAL LOW
GFR calc non Af Amer: 73 mL/min — ABNORMAL LOW

## 2013-06-22 MED ORDER — NEBIVOLOL HCL 10 MG PO TABS
10.0000 mg | ORAL_TABLET | Freq: Every day | ORAL | Status: DC
  Administered 2013-06-23 – 2013-06-25 (×3): 10 mg via ORAL
  Filled 2013-06-22 (×3): qty 1

## 2013-06-22 MED ORDER — EMTRICITAB-RILPIVIR-TENOFOV DF 200-25-300 MG PO TABS: ORAL_TABLET | Freq: Every day | ORAL | Status: DC

## 2013-06-22 MED ORDER — IRBESARTAN 300 MG PO TABS
300.0000 mg | ORAL_TABLET | Freq: Every day | ORAL | Status: DC
  Administered 2013-06-23 – 2013-06-25 (×3): 300 mg via ORAL
  Filled 2013-06-22 (×3): qty 1

## 2013-06-22 MED ORDER — ASPIRIN 325 MG PO TABS
325.0000 mg | ORAL_TABLET | Freq: Every day | ORAL | Status: DC
  Administered 2013-06-22 – 2013-06-23 (×2): 325 mg via ORAL
  Filled 2013-06-22 (×2): qty 1

## 2013-06-22 MED ORDER — POLYETHYL GLYCOL-PROPYL GLYCOL 0.4-0.3 % OP SOLN: 1.0000 [drp] | Freq: Every day | OPHTHALMIC | Status: DC | PRN

## 2013-06-22 MED ORDER — SODIUM CHLORIDE 0.9 % IV SOLN
INTRAVENOUS | Status: DC
  Administered 2013-06-22: via INTRAVENOUS

## 2013-06-22 MED ORDER — HYDRALAZINE HCL 20 MG/ML IJ SOLN
10.0000 mg | Freq: Four times a day (QID) | INTRAMUSCULAR | Status: DC | PRN
  Administered 2013-06-23: 10 mg via INTRAVENOUS
  Filled 2013-06-22: qty 1

## 2013-06-22 MED ORDER — ENOXAPARIN SODIUM 40 MG/0.4ML ~~LOC~~ SOLN
40.0000 mg | SUBCUTANEOUS | Status: DC
  Administered 2013-06-22 – 2013-06-24 (×3): 40 mg via SUBCUTANEOUS
  Filled 2013-06-22 (×4): qty 0.4

## 2013-06-22 MED ORDER — ZIDOVUDINE 100 MG PO CAPS
300.0000 mg | ORAL_CAPSULE | Freq: Two times a day (BID) | ORAL | Status: DC
  Administered 2013-06-23 – 2013-06-25 (×5): 300 mg via ORAL
  Filled 2013-06-22 (×7): qty 3

## 2013-06-22 MED ORDER — GADOBENATE DIMEGLUMINE 529 MG/ML IV SOLN
13.0000 mL | Freq: Once | INTRAVENOUS | Status: AC | PRN
  Administered 2013-06-22: 13 mL via INTRAVENOUS

## 2013-06-22 MED ORDER — NAPHAZOLINE-PHENIRAMINE 0.025-0.3 % OP SOLN: 1.0000 [drp] | Freq: Every day | OPHTHALMIC | Status: DC | PRN

## 2013-06-22 NOTE — H&P (Signed)
[facility] History and Physical  [name] WNU:[id number] DOB: 12-06-1955 DOA: 06/22/2013  Referring physician:  PCP: Daniel Rhymes, MD  Specialists:   Chief Complaint: visual changes   HPI: Daniel Gonzalez is a 57 y.o. male with PMH of HTN, HIV presented to PCP office today with visual change who recommended to obtain MRI found to have CVA; patient reports having R vision changes since Friday associated with headaches but without other neurological findings, denies focal weakness, no paraesthesia, no ataxia, no fever, no chest pain, no SOB;   Review of Systems: The patient denies anorexia, fever, weight loss,, vision loss, decreased hearing, hoarseness, chest pain, syncope, dyspnea on exertion, peripheral edema, balance deficits, hemoptysis, abdominal pain, melena, hematochezia, severe indigestion/heartburn, hematuria, incontinence, genital sores, muscle weakness, suspicious skin lesions, difficulty walking, depression, unusual weight change, abnormal bleeding, enlarged lymph nodes, angioedema, and breast masses.    Past Medical History  Diagnosis Date  . Cluster headache   . Blood in stool   . Migraine   . Frequent episodic tension-type headache   . Hypertension   . HIV infection   . Pneumonia    Past Surgical History  Procedure Laterality Date  . Cyst in back      removed from back   Social History:  reports that he has been smoking Cigarettes.  He has a 26.25 pack-year smoking history. He has never used smokeless tobacco. He reports that he drinks alcohol. He reports that he uses illicit drugs (Marijuana). Home:  where does patient live--home, ALF, SNF? and with whom if at home? Yes:  Can patient participate in ADLs?  Allergies  Allergen Reactions  . Asa [Aspirin] Palpitations    Speeds up heart rate  . Sulfamethoxazole-Tmp Ds Itching    Family History  Problem Relation Age of Onset  . Arthritis Mother   . Heart disease Mother   . Hypertension Mother    . Diabetes Mother   . Arthritis Father   . Heart disease Father   . Hypertension Father   . Colon cancer Neg Hx   . Lung cancer Brother     (be sure to complete)  Prior to Admission medications   Medication Sig Start Date End Date Taking? Authorizing Provider  acetaminophen (TYLENOL) 500 MG tablet Take 1,000 mg by mouth every 6 (six) hours as needed.    Yes Historical Provider, MD  COMPLERA 200-25-300 MG TABS Take 1 tablet by mouth once a day . Must be taken with  a medium-sized meal. 11/10/12  Yes Cliffton Asters, MD  fluticasone [facility]) 50 MCG/ACT nasal spray Place 2 sprays into the nose as needed for allergies.  02/11/13  Yes Sheliah Hatch, MD  naphazoline-pheniramine (NAPHCON-A) 0.025-0.3 % ophthalmic solution Place 1 drop into both eyes daily as needed for irritation.   Yes Historical Provider, MD  naproxen sodium (ANAPROX) 220 MG tablet Take 440 mg by mouth daily as needed (for pain).   Yes Historical Provider, MD  nebivolol (BYSTOLIC) 10 MG tablet Take 1 tablet (10 mg total) by mouth daily. 06/20/13  Yes Sheliah Hatch, MD  Polyethyl Glycol-Propyl Glycol (SYSTANE) 0.4-0.3 % SOLN Apply 1 drop to eye daily as needed (for dry eyes).   Yes Historical Provider, MD  sildenafil (VIAGRA) 100 MG tablet Take 100 mg by mouth as directed.     Yes Historical Provider, MD  valsartan (DIOVAN) 320 MG tablet Take 320 mg by mouth daily. 02/11/13  Yes Sheliah Hatch, MD  zidovudine (RETROVIR) 300  MG tablet Take 1 tablet (300 mg total) by mouth 2 (two) times daily. 03/29/13  Yes Cliffton Asters, MD   Physical Exam: Filed Vitals:   06/22/13 1723  BP:   Pulse:   Temp: 98.1 F (36.7 C)  Resp:      General:  alert  Eyes: EOM-I, PERRLA  ENT: no oral ulcers   Neck: supple   Cardiovascular: s1,s2 rrr  Respiratory: few crackles in LL  Abdomen: soft, nt, nd   Skin: no rash   Musculoskeletal: no LE edema  Psychiatric: no hallucinations   Neurologic: R peripheral visual field  decreased, otherwise CN2-12 intact; motor 5/5 intact   Labs on Admission:  Basic Metabolic Panel:  Recent Labs Lab 06/22/13 1258 06/22/13 1654  NA  --  137  K  --  4.0  CL  --  105  CO2  --  24  GLUCOSE  --  85  BUN  --  11  CREATININE 1.11 1.00  CALCIUM  --  9.0   Liver Function Tests: No results found for this basename: AST, ALT, ALKPHOS, BILITOT, PROT, ALBUMIN,  in the last 168 hours No results found for this basename: LIPASE, AMYLASE,  in the last 168 hours No results found for this basename: AMMONIA,  in the last 168 hours CBC:  Recent Labs Lab 06/22/13 1654  WBC 4.9  NEUTROABS 1.1*  HGB 13.6  HCT 37.5*  MCV 105.9*  PLT 216   Cardiac Enzymes: No results found for this basename: CKTOTAL, CKMB, CKMBINDEX, TROPONINI,  in the last 168 hours  BNP (last 3 results) No results found for this basename: PROBNP,  in the last 8760 hours CBG: No results found for this basename: GLUCAP,  in the last 168 hours  Radiological Exams on Admission: Mr Laqueta Jean Wo Contrast  06/22/2013   CLINICAL DATA:  Visual difficulty with headache and balance disturbance for 4 days.  EXAM: MRI HEAD WITHOUT AND WITH CONTRAST  TECHNIQUE: Multiplanar, multiecho pulse sequences of the brain and surrounding structures were obtained according to standard protocol without and with intravenous contrast  CONTRAST:  MultiHance 13 mL.  COMPARISON:  CT head 01/08/2006  FINDINGS: Small foci of acute infarction are observed in the left PCA territory affecting the left medial posterior temporal lobe and occipital lobe. Subcentimeter focus of restricted diffusion also involves the left paramedian splenium.  No visible acute hemorrhage, mass lesion, hydrocephalus, or extra-axial fluid. Mild premature cerebral and cerebellar atrophy for the patient's age of 28. Prominent perivascular spaces suggesting longstanding hypertension. No foci of chronic hemorrhage. Increased T2 and FLAIR white matter hyperintensities, also  premature for age, likely hypertensive related chronic microvascular ischemic change.  Remote cortical infarct affects the left posterior parietal lobe (image 16 series 6).  Normal pituitary, with mild cerebellar tonsillar ectopia. No osseous findings. Negative orbits. Mild chronic sinus disease. Mastoids unremarkable.  Flow voids are maintained in the internal carotid arteries and basilar artery. Both vertebrals appear to contribute to formation of the basilar. 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 Possible similar aneurysm 4 mm in size projecting laterally in the right cavernous sinus, versus tortuosity. Recommend MRA of the intracranial circulation for further evaluation.  Compared with prior CT, no abnormality was detected.  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 The observed pattern of T2 hyperintensity and restricted diffusion would  be consistent with an insult beginning 4 days earlier.  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 MRA intracranial recommended for further evaluation.  Prominent perivascular spaces in association with slight atrophy and white matter signal abnormality, likely sequelae of hypertensive cerebral vascular disease. Also small remote left parietal cortical infarct.  These results will be called to the ordering clinician or representative by the Radiologist Assistant, and communication documented in the PACS Dashboard.   Electronically Signed   By: Davonna Belling M.D.   On: 06/22/2013 15:14    EKG: Independently reviewed. Pend   Assessment/Plan Principal Problem:   Acute CVA (cerebrovascular accident) Active Problems:   HTN  (hypertension)   HIV DISEASE   57 y.o. male with PMH of HTN, HIV presented to PCP office today with visual change who recommended to obtain MRI found to have CVA  1. Acute CVA ? cardiembolic; MRI: Acute multifocal nonhemorrhagic left PCA territory infarction affecting the left posterior temporal lobe and occipital lobe; remote left parietal cortical infarct. 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 HTN uncontrolled; resume bystolic, valsartan/titrate;  Consider amlodipine if uncontrolled; prn hydralazine;   3. HIV; cont home regimen; last CD 4-->860 (04/2013)   Neurology:  if consultant consulted, please document name and whether formally or informally consulted  Code Status: full (must indicate code status--if unknown or must be presumed, indicate so) Family Communication: none at the bedside (indicate person spoken with, if applicable, with phone number if by telephone) Disposition Plan: home when ready  (indicate anticipated LOS)  Time spent: >45 minutes   Esperanza Sheets [facility] Pager [phone]  If 7PM-7AM, please contact night-coverage [email or web address] Password [facility] 06/22/2013, 6:16 PM

## 2013-06-22 NOTE — Assessment & Plan Note (Signed)
Much improved today after starting Bystolic in addition to Diovan.  Suspect that HA and visual changes are independent of BP and that BP was actually high b/c of the HA and visual changes.  Will continue to follow.

## 2013-06-22 NOTE — Assessment & Plan Note (Signed)
New.  Pt having persistent visual changes at periphery- even w/ eyes closed.  Vision is 20/30 and 20/25 in R and L eye respectively.  Having associated balance disturbance when 1 eye is closed.  Must get stat CT scan to r/o mass lesion that would be causing sxs.  Also refer to neuro for complete evaluation to r/o ocular migraine or even possible seizures.  Pt expressed understanding and is in agreement w/ plan.

## 2013-06-22 NOTE — Consult Note (Addendum)
NEURO HOSPITALIST CONSULT NOTE    Reason for Consult: visual disturbance, stroke  HPI:                                                                                                                                          Daniel Gonzalez is an 57 y.o. male with a past medical history significant for HTN, HIV infection, cluster headache, migraine, heavy smoking, who was in his usual state of health until 5 days ago when he started having " visual changes like a colorful circle that comes and goes". He never had similar symptoms before but tells me that last Friday he woke up with a HA and then started experiencing intermittent visual changes that he describes as " a colorful circle, like the ones they use in the Christmas tree" that is off and on, every 5 minutes. He said that he is not having visual loss or double vision but it is almost impossible to drive. Denies associated vertigo, focal weakness or numbness, difficulty swallowing, slurred speech, unsteadiness, confusion, chest pain, SOB, or palpitations. A brain MRI was ordered as outpatient and today he got a phone call advising his to come to the hospital for further evaluation of an abnormal MRI. I reviewed his brain MRI which showed an acute multifocal nonhemorrhagic left PCA territory infarction affecting the left posterior temporal lobe and occipital lobe.    Past Medical History  Diagnosis Date  . Cluster headache   . Blood in stool   . Migraine   . Frequent episodic tension-type headache   . Hypertension   . HIV infection   . Pneumonia     Past Surgical History  Procedure Laterality Date  . Cyst in back      removed from back    Family History  Problem Relation Age of Onset  . Arthritis Mother   . Heart disease Mother   . Hypertension Mother   . Diabetes Mother   . Arthritis Father   . Heart disease Father   . Hypertension Father   . Colon cancer Neg Hx   . Lung cancer Brother     Social  History:  reports that he has been smoking Cigarettes.  He has a 26.25 pack-year smoking history. He has never used smokeless tobacco. He reports that he drinks alcohol. He reports that he uses illicit drugs (Marijuana).  Allergies  Allergen Reactions  . Asa [Aspirin] Palpitations    Speeds up heart rate  . Sulfamethoxazole-Tmp Ds Itching    MEDICATIONS:  I have reviewed the patient's current medications.   ROS:                                                                                                                                       History obtained from the patient and chart review.  General ROS: negative for - chills, fatigue, fever, night sweats, weight gain or weight loss Psychological ROS: negative for - behavioral disorder, hallucinations, memory difficulties, mood swings or suicidal ideation Ophthalmic ROS: negative for -  double vision, eye pain or loss of vision ENT ROS: negative for - epistaxis, nasal discharge, oral lesions, sore throat, tinnitus or vertigo Allergy and Immunology ROS: negative for - hives or itchy/watery eyes Hematological and Lymphatic ROS: negative for - bleeding problems, bruising or swollen lymph nodes Endocrine ROS: negative for - galactorrhea, hair pattern changes, polydipsia/polyuria or temperature intolerance Respiratory ROS: negative for - cough, hemoptysis, shortness of breath or wheezing Cardiovascular ROS: negative for - chest pain, dyspnea on exertion, edema or irregular heartbeat Gastrointestinal ROS: negative for - abdominal pain, diarrhea, hematemesis, nausea/vomiting or stool incontinence Genito-Urinary ROS: negative for - dysuria, hematuria, incontinence or urinary frequency/urgency Musculoskeletal ROS: negative for - joint swelling or muscular weakness Neurological ROS: as noted in HPI Dermatological ROS: negative for  rash and skin lesion changes   Physical exam: pleasant male in no apparent distress. Blood pressure 171/93, pulse 51, temperature 98.1 F (36.7 C), temperature source Oral, resp. rate 19, height 5\' 4"  (1.626 m), weight 63.957 kg (141 lb), SpO2 100.00%. Head: normocephalic. Neck: supple, no bruits, no JVD. Cardiac: no murmurs. Lungs: clear. Abdomen: soft, no tender, no mass. Extremities: no edema.  Neurologic Examination:                                                                                                      Mental Status: Alert, oriented, thought content appropriate.  Speech fluent without evidence of aphasia.  Able to follow 3 step commands without difficulty. Cranial Nerves: II: Discs flat bilaterally; Visual fields grossly normal, pupils equal, round, reactive to light and accommodation III,IV, VI: ptosis not present, extra-ocular motions intact bilaterally V,VII: smile symmetric, facial light touch sensation normal bilaterally VIII: hearing normal bilaterally IX,X: gag reflex present XI: bilateral shoulder shrug XII: midline tongue extension without atrophy or fasciculations  Motor: Right : Upper extremity   5/5    Left:     Upper extremity   5/5  Lower extremity   5/5  Lower extremity   5/5 Tone and bulk:normal tone throughout; no atrophy noted Sensory: Pinprick and light touch intact throughout, bilaterally Deep Tendon Reflexes:  Right: Upper Extremity   Left: Upper extremity   biceps (C-5 to C-6) 2/4   biceps (C-5 to C-6) 2/4 tricep (C7) 2/4    triceps (C7) 2/4 Brachioradialis (C6) 2/4  Brachioradialis (C6) 2/4  Lower Extremity Lower Extremity  quadriceps (L-2 to L-4) 2/4   quadriceps (L-2 to L-4) 2/4 Achilles (S1) 2/4   Achilles (S1) 2/4  Plantars: Right: downgoing   Left: downgoing Cerebellar: normal finger-to-nose,  normal heel-to-shin test Gait:  No ataxia. CV: pulses palpable throughout    Lab Results  Component Value Date/Time   CHOL 138  01/07/2013  9:03 AM    Results for orders placed during the hospital encounter of 06/22/13 (from the past 48 hour(s))  PROTIME-INR     Status: None   Collection Time    06/22/13  4:54 PM      Result Value Range   Prothrombin Time 13.0  11.6 - 15.2 seconds   INR 1.00  0.00 - 1.49  BASIC METABOLIC PANEL     Status: Abnormal   Collection Time    06/22/13  4:54 PM      Result Value Range   Sodium 137  135 - 145 mEq/L   Potassium 4.0  3.5 - 5.1 mEq/L   Chloride 105  96 - 112 mEq/L   CO2 24  19 - 32 mEq/L   Glucose, Bld 85  70 - 99 mg/dL   BUN 11  6 - 23 mg/dL   Creatinine, Ser 1.61  0.50 - 1.35 mg/dL   Calcium 9.0  8.4 - 09.6 mg/dL   GFR calc non Af Amer 83 (*) >90 mL/min   GFR calc Af Amer >90  >90 mL/min   Comment: (NOTE)     The eGFR has been calculated using the CKD EPI equation.     This calculation has not been validated in all clinical situations.     eGFR's persistently <90 mL/min signify possible Chronic Kidney     Disease.  CBC WITH DIFFERENTIAL     Status: Abnormal   Collection Time    06/22/13  4:54 PM      Result Value Range   WBC 4.9  4.0 - 10.5 K/uL   RBC 3.54 (*) 4.22 - 5.81 MIL/uL   Hemoglobin 13.6  13.0 - 17.0 g/dL   HCT 04.5 (*) 40.9 - 81.1 %   MCV 105.9 (*) 78.0 - 100.0 fL   MCH 38.4 (*) 26.0 - 34.0 pg   MCHC 36.3 (*) 30.0 - 36.0 g/dL   RDW 91.4  78.2 - 95.6 %   Platelets 216  150 - 400 K/uL   Neutrophils Relative % 23 (*) 43 - 77 %   Neutro Abs 1.1 (*) 1.7 - 7.7 K/uL   Lymphocytes Relative 67 (*) 12 - 46 %   Lymphs Abs 3.3  0.7 - 4.0 K/uL   Monocytes Relative 8  3 - 12 %   Monocytes Absolute 0.4  0.1 - 1.0 K/uL   Eosinophils Relative 2  0 - 5 %   Eosinophils Absolute 0.1  0.0 - 0.7 K/uL   Basophils Relative 0  0 - 1 %   Basophils Absolute 0.0  0.0 - 0.1 K/uL    Mr Laqueta Jean Wo Contrast  06/22/2013   CLINICAL DATA:  Visual difficulty with headache and balance disturbance for  4 days.  EXAM: MRI HEAD WITHOUT AND WITH CONTRAST  TECHNIQUE: Multiplanar,  multiecho pulse sequences of the brain and surrounding structures were obtained according to standard protocol without and with intravenous contrast  CONTRAST:  MultiHance 13 mL.  COMPARISON:  CT head 01/08/2006  FINDINGS: Small foci of acute infarction are observed in the left PCA territory affecting the left medial posterior temporal lobe and occipital lobe. Subcentimeter focus of restricted diffusion also involves the left paramedian splenium.  No visible acute hemorrhage, mass lesion, hydrocephalus, or extra-axial fluid. Mild premature cerebral and cerebellar atrophy for the patient's age of 30. Prominent perivascular spaces suggesting longstanding hypertension. No foci of chronic hemorrhage. Increased T2 and FLAIR white matter hyperintensities, also premature for age, likely hypertensive related chronic microvascular ischemic change.  Remote cortical infarct affects the left posterior parietal lobe (image 16 series 6).  Normal pituitary, with mild cerebellar tonsillar ectopia. No osseous findings. Negative orbits. Mild chronic sinus disease. Mastoids unremarkable.  Flow voids are maintained in the internal carotid arteries and basilar artery. Both vertebrals appear to contribute to formation of the basilar. 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 Possible similar aneurysm 4 mm in size projecting laterally in the right cavernous sinus, versus tortuosity. Recommend MRA of the intracranial circulation for further evaluation.  Compared with prior CT, no abnormality was detected.  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 The observed pattern of T2 hyperintensity and restricted diffusion would be consistent with an insult beginning 4 days earlier.  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 MRA intracranial recommended for further evaluation.  Prominent perivascular spaces in association with slight atrophy and white matter signal abnormality, likely sequelae of hypertensive cerebral vascular disease. Also small remote left parietal cortical infarct.  These results will be called to the ordering clinician or representative by the Radiologist Assistant, and communication documented in the PACS Dashboard.   Electronically Signed   By: Davonna Belling M.D.   On: 06/22/2013 15:14    Assessment/Plan: 57 y/o with new onset intermittent visual disturbance as described above, unremarkable neuro-exam, and brain MRI revealing multifocal nonhemorrhagic left PCA territory infarction affecting the left posterior temporal lobe and occipital lobe. Will start plavix as he said that he is allergic to aspirin, and complete stroke work up. Stroke team to resume care in the morning.    Wyatt Portela, MD [facility] [phone]  06/22/2013, 6:49 PM   Patient stated that aspirin causes " severe palpitations" and thus will start Plavix.

## 2013-06-22 NOTE — Assessment & Plan Note (Addendum)
Refer to neuro for complete evaluation and tx as pt is having auras and possibly ocular migraines.

## 2013-06-22 NOTE — Progress Notes (Signed)
  Subjective:    Patient ID: Daniel Gonzalez, male    DOB: 1956/02/13, 57 y.o.   MRN: [id number]  HPI HTN- started on Bystolic at last visit due to very high BP.  BP now much improved.  Denies CP, SOB.  + visual changes and HAs.  Visual disturbance- pt reports it will occur randomly, lasting 3-5 minutes.  Described as a 'spinning color wheel' in the lateral periphery.  Still sees the colors even w/ eyes closed.  + HA- L sided, worse w/ leaning forward.  No nausea.  Pt had difficulty maintaining balance when closing 1 eye for vision test.  'this is freaking me out'.  Has seen eye doctor.   Review of Systems For ROS see HPI     Objective:   Physical Exam  Vitals reviewed. Constitutional: He is oriented to person, place, and time. He appears well-developed and well-nourished. No distress.  HENT:  Head: Normocephalic and atraumatic.  Eyes: Conjunctivae and EOM are normal. Pupils are equal, round, and reactive to light.  Neck: Normal range of motion. Neck supple. No thyromegaly present.  Cardiovascular: Normal rate, regular rhythm, normal heart sounds and intact distal pulses.   No murmur heard. Pulmonary/Chest: Effort normal and breath sounds normal. No respiratory distress.  Abdominal: Soft. Bowel sounds are normal. He exhibits no distension.  Musculoskeletal: He exhibits no edema.  Lymphadenopathy:    He has no cervical adenopathy.  Neurological: He is alert and oriented to person, place, and time. No cranial nerve deficit. Coordination (unable to maintain balance when closing 1 eye) abnormal.  Skin: Skin is warm and dry.  Psychiatric: He has a normal mood and affect. His behavior is normal.          Assessment & Plan:

## 2013-06-22 NOTE — Progress Notes (Signed)
Called ED for report  

## 2013-06-22 NOTE — ED Notes (Addendum)
Per [facility], pt went out drinking Thursday night, woke up Friday morning with a HA and some right visual field disturbance. Both became worse over the weekend so the patient went to see his cardiologist on Monday and was started on another BP pill. Went this morning back to the MD and an MRI ordered. Pt had MRI done at [facility] as outpatient. MD called him and asked him to come to the emergency room but not to drive because thinks he might have had a stroke. BP 210/114 and 175/115.

## 2013-06-22 NOTE — ED Provider Notes (Signed)
CSN: [id number]     Arrival date & time 06/22/13  1631 History   First MD Initiated Contact with Patient 06/22/13 1632     Chief Complaint  Patient presents with  . Visual Field Change    HPI  Patient presents after he received a call at home from his primary care physician and his MRI was abnormal.  He states that he has been hypertensive for some time. Saw his infectious disease doctor about a month ago. Referred back to primary care because of some high blood pressure readings. He states he was, and has been compliant with his blood pressure medications. Friday, 5 days ago he started seeing some middle field changes in his peripheral vision bilaterally he describes it as a "moving color wheel".  He saw his primary care physician on Monday, 2 days ago. He mentions symptoms. An MRI was scheduled. A second blood pressure medicine was ordered. He started on bystolic 10 mg.  Was seen at a clinic today. His blood pressure was better. An MRI was ordered because of the visual symptoms. He returned home. He then received a call that the MRI and return here. No palpitations. No headache. No peripheral neurological loss with numbness weakness or difficulty with balance coordination speech swallowing et Karie Soda.  Past Medical History  Diagnosis Date  . Cluster headache   . Blood in stool   . Migraine   . Frequent episodic tension-type headache   . Hypertension   . HIV infection   . Pneumonia    Past Surgical History  Procedure Laterality Date  . Cyst in back      removed from back   Family History  Problem Relation Age of Onset  . Arthritis Mother   . Heart disease Mother   . Hypertension Mother   . Diabetes Mother   . Arthritis Father   . Heart disease Father   . Hypertension Father   . Colon cancer Neg Hx   . Lung cancer Brother    History  Substance Use Topics  . Smoking status: Current Every Day Smoker -- 0.75 packs/day for 35 years    Types: Cigarettes  . Smokeless tobacco: Never  Used  . Alcohol Use: 0.0 oz/week    0 drink(s) per week     Comment: no fun drinking by yourself    Review of Systems  Constitutional: Negative for fever, chills, diaphoresis, appetite change and fatigue.  HENT: Negative for mouth sores, sore throat and trouble swallowing.   Eyes: Positive for visual disturbance.  Respiratory: Negative for cough, chest tightness, shortness of breath and wheezing.   Cardiovascular: Negative for chest pain.  Gastrointestinal: Negative for nausea, vomiting, abdominal pain, diarrhea and abdominal distention.  Endocrine: Negative for polydipsia, polyphagia and polyuria.  Genitourinary: Negative for dysuria, frequency and hematuria.  Musculoskeletal: Negative for gait problem.  Skin: Negative for color change, pallor and rash.  Neurological: Negative for dizziness, syncope, light-headedness and headaches.  Hematological: Does not bruise/bleed easily.  Psychiatric/Behavioral: Negative for behavioral problems and confusion.    Allergies  Asa and Sulfamethoxazole-tmp ds  Home Medications   Current Outpatient Rx  Name  Route  Sig  Dispense  Refill  . acetaminophen (TYLENOL) 500 MG tablet   Oral   Take 1,000 mg by mouth every 6 (six) hours as needed.          . COMPLERA 200-25-300 MG TABS      Take 1 tablet by mouth once a day . Must be taken  with  a medium-sized meal.   90 tablet   4   . fluticasone (FLONASE) 50 MCG/ACT nasal spray   Nasal   Place 2 sprays into the nose as needed for allergies.          . naphazoline-pheniramine (NAPHCON-A) 0.025-0.3 % ophthalmic solution   Both Eyes   Place 1 drop into both eyes daily as needed for irritation.         . naproxen sodium (ANAPROX) 220 MG tablet   Oral   Take 440 mg by mouth daily as needed (for pain).         . nebivolol (BYSTOLIC) 10 MG tablet   Oral   Take 1 tablet (10 mg total) by mouth daily.   30 tablet   3   . Polyethyl Glycol-Propyl Glycol (SYSTANE) 0.4-0.3 % SOLN    Ophthalmic   Apply 1 drop to eye daily as needed (for dry eyes).         . sildenafil (VIAGRA) 100 MG tablet   Oral   Take 100 mg by mouth as directed.           . valsartan (DIOVAN) 320 MG tablet   Oral   Take 320 mg by mouth daily.         . zidovudine (RETROVIR) 300 MG tablet   Oral   Take 1 tablet (300 mg total) by mouth 2 (two) times daily.   180 tablet   4    BP 165/97  Pulse 52  Temp(Src) 98.1 F (36.7 C) (Oral)  Resp 18  Ht 5\' 4"  (1.626 m)  Wt 141 lb (63.957 kg)  BMI 24.19 kg/m2  SpO2 98% Physical Exam  Constitutional: He is oriented to person, place, and time. He appears well-developed and well-nourished. No distress.  HENT:  Head: Normocephalic.  Eyes: Conjunctivae are normal. Pupils are equal, round, and reactive to light. No scleral icterus.  Neck: Normal range of motion. Neck supple. Carotid bruit is not present. No thyromegaly present.  Cardiovascular: Normal rate and regular rhythm.  Exam reveals no gallop and no friction rub.   No murmur heard. Pulmonary/Chest: Effort normal and breath sounds normal. No respiratory distress. He has no wheezes. He has no rales.  Abdominal: Soft. Bowel sounds are normal. He exhibits no distension. There is no tenderness. There is no rebound.  Musculoskeletal: Normal range of motion.  Neurological: He is alert and oriented to person, place, and time.  He describes intermittent episodes of seeing moving colors in his peripheral vision. No nystagmus. No visual field cuts. No nystagmus. Cranial nerves intact on exam. No peripheral neurological loss. No pronator drift. No weakness. Normal cerebellar function.  Skin: Skin is warm and dry. No rash noted.  Psychiatric: He has a normal mood and affect. His behavior is normal.    ED Course  Procedures (including critical care time) Labs Review Labs Reviewed  BASIC METABOLIC PANEL - Abnormal; Notable for the following:    GFR calc non Af Amer 83 (*)    All other components  within normal limits  CBC WITH DIFFERENTIAL - Abnormal; Notable for the following:    RBC 3.54 (*)    HCT 37.5 (*)    MCV 105.9 (*)    MCH 38.4 (*)    MCHC 36.3 (*)    Neutrophils Relative % 23 (*)    Neutro Abs 1.1 (*)    Lymphocytes Relative 67 (*)    All other components within normal limits  PROTIME-INR  Imaging Review Mr Laqueta Jean GN Contrast  06/22/2013   CLINICAL DATA:  Visual difficulty with headache and balance disturbance for 4 days.  EXAM: MRI HEAD WITHOUT AND WITH CONTRAST  TECHNIQUE: Multiplanar, multiecho pulse sequences of the brain and surrounding structures were obtained according to standard protocol without and with intravenous contrast  CONTRAST:  MultiHance 13 mL.  COMPARISON:  CT head 01/08/2006  FINDINGS: Small foci of acute infarction are observed in the left PCA territory affecting the left medial posterior temporal lobe and occipital lobe. Subcentimeter focus of restricted diffusion also involves the left paramedian splenium.  No visible acute hemorrhage, mass lesion, hydrocephalus, or extra-axial fluid. Mild premature cerebral and cerebellar atrophy for the patient's age of 26. Prominent perivascular spaces suggesting longstanding hypertension. No foci of chronic hemorrhage. Increased T2 and FLAIR white matter hyperintensities, also premature for age, likely hypertensive related chronic microvascular ischemic change.  Remote cortical infarct affects the left posterior parietal lobe (image 16 series 6).  Normal pituitary, with mild cerebellar tonsillar ectopia. No osseous findings. Negative orbits. Mild chronic sinus disease. Mastoids unremarkable.  Flow voids are maintained in the internal carotid arteries and basilar artery. Both vertebrals appear to contribute to formation of the basilar. 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 Possible similar aneurysm 4 mm in size  projecting laterally in the right cavernous sinus, versus tortuosity. Recommend MRA of the intracranial circulation for further evaluation.  Compared with prior CT, no abnormality was detected.  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 The observed pattern of T2 hyperintensity and restricted diffusion would be consistent with an insult beginning 4 days earlier.  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 MRA intracranial recommended for further evaluation.  Prominent perivascular spaces in association with slight atrophy and white matter signal abnormality, likely sequelae of hypertensive cerebral vascular disease. Also small remote left parietal cortical infarct.  These results will be called to the ordering clinician or representative by the Radiologist Assistant, and communication documented in the PACS Dashboard.   Electronically Signed   By: Davonna Belling M.D.   On: 06/22/2013 15:14    EKG Interpretation   None       MDM   1. CVA (cerebral infarction)     MRI shows left medial temporal, and occipital multifocal areas of infarct. This is a posterior cerebral  artery distribution.  EKG shows sinus rhythm. Baseline labs pending. Plan neurological consultation.  Was discussed with Dr. Hyacinth Meeker of urology. Also the [facility] hospitalist. Multifocal infarct. Will need evaluation for source. Plan is telemetry admission.   Roney Marion, MD 06/22/13 [phone]

## 2013-06-22 NOTE — Patient Instructions (Signed)
Go to [facility] at 1:00 today for your MRI [facility] call you with your neuro appt (still working on it) Please see if someone can drive you to your appt Call with any questions or concerns Hang in there!!

## 2013-06-23 ENCOUNTER — Inpatient Hospital Stay (HOSPITAL_COMMUNITY): Payer: 59

## 2013-06-23 ENCOUNTER — Encounter (HOSPITAL_COMMUNITY): Payer: Self-pay | Admitting: Radiology

## 2013-06-23 DIAGNOSIS — H539 Unspecified visual disturbance: Secondary | ICD-10-CM

## 2013-06-23 DIAGNOSIS — I6789 Other cerebrovascular disease: Secondary | ICD-10-CM

## 2013-06-23 LAB — RAPID URINE DRUG SCREEN, HOSP PERFORMED
Barbiturates: NOT DETECTED
Benzodiazepines: NOT DETECTED
Cocaine: NOT DETECTED

## 2013-06-23 LAB — LIPID PANEL
Cholesterol: 150 mg/dL (ref 0–200)
Triglycerides: 84 mg/dL (ref <150)

## 2013-06-23 LAB — SEDIMENTATION RATE: Sed Rate: 7 mm/hr (ref 0–16)

## 2013-06-23 LAB — RPR: RPR Ser Ql: NONREACTIVE

## 2013-06-23 LAB — HEMOGLOBIN A1C
Hgb A1c MFr Bld: 5.7 % — ABNORMAL HIGH (ref <5.7)
Mean Plasma Glucose: 120 mg/dL — ABNORMAL HIGH (ref <117)

## 2013-06-23 MED ORDER — EMTRICITABINE-TENOFOVIR DF 200-300 MG PO TABS
1.0000 | ORAL_TABLET | Freq: Every day | ORAL | Status: DC
  Administered 2013-06-23 – 2013-06-25 (×3): 1 via ORAL
  Filled 2013-06-23 (×4): qty 1

## 2013-06-23 MED ORDER — ACETAMINOPHEN 325 MG PO TABS
650.0000 mg | ORAL_TABLET | ORAL | Status: DC | PRN
  Administered 2013-06-23 – 2013-06-25 (×10): 650 mg via ORAL
  Filled 2013-06-23 (×9): qty 2

## 2013-06-23 MED ORDER — IOHEXOL 350 MG/ML SOLN
50.0000 mL | Freq: Once | INTRAVENOUS | Status: AC | PRN
  Administered 2013-06-23: 50 mL via INTRAVENOUS

## 2013-06-23 MED ORDER — RILPIVIRINE HCL 25 MG PO TABS
25.0000 mg | ORAL_TABLET | Freq: Every day | ORAL | Status: DC
  Administered 2013-06-23 – 2013-06-25 (×3): 25 mg via ORAL
  Filled 2013-06-23 (×5): qty 1

## 2013-06-23 NOTE — Progress Notes (Signed)
Utilization review completed. Tonique Mendonca, RN, BSN. 

## 2013-06-23 NOTE — Progress Notes (Signed)
Stroke Team Progress Note  HISTORY Daniel Gonzalez is an 57 y.o. male with a past medical history significant for HTN, HIV infection, cluster headache, migraine, heavy smoking, who was in his usual state of health until 5 days ago when he started having " visual changes like a colorful circle that comes and goes".  He never had similar symptoms before but tells me that last Friday he woke up with a HA and then started experiencing intermittent visual changes that he describes as " a colorful circle, like the ones they use in the Christmas tree" that is off and on, every 5 minutes. He said that he is not having visual loss or double vision but it is almost impossible to drive.   Denies associated vertigo, focal weakness or numbness, difficulty swallowing, slurred speech, unsteadiness, confusion, chest pain, SOB, or palpitations.  A brain MRI was ordered as outpatient and today he got a phone call advising his to come to the hospital for further evaluation of an abnormal MRI.  I reviewed his brain MRI which showed an acute multifocal nonhemorrhagic left PCA territory infarction affecting the left posterior temporal lobe and occipital lobe.   Patient was not a TPA candidate secondary to symptoms outside of tPA window. He was admitted for further evaluation and treatment.   SUBJECTIVE He is lying in bed. Overall he feels his condition is rapidly improving.     OBJECTIVE Most recent Vital Signs: Filed Vitals:   06/22/13 2323 06/23/13 0203 06/23/13 0630 06/23/13 0940  BP: 156/98 153/84 162/90 149/100  Pulse: 50 48 48 51  Temp: 98.2 F (36.8 C) 98.1 F (36.7 C) 98 F (36.7 C) 97.7 F (36.5 C)  TempSrc: Oral Oral Oral Oral  Resp: 18 18 20 20   Height:      Weight:      SpO2: 100% 100% 100% 97%   CBG (last 3)  No results found for this basename: GLUCAP,  in the last 72 hours  IV Fluid Intake:   . sodium chloride 50 mL/hr at 06/22/13 2330    MEDICATIONS  . aspirin  325 mg Oral Daily   . emtricitabine-tenofovir  1 tablet Oral Q breakfast   And  . rilpivirine  25 mg Oral Q breakfast  . enoxaparin (LOVENOX) injection  40 mg Subcutaneous Q24H  . irbesartan  300 mg Oral Daily  . nebivolol  10 mg Oral Daily  . zidovudine  300 mg Oral BID   PRN:  acetaminophen, hydrALAZINE, naphazoline-pheniramine, Polyethyl Glycol-Propyl Glycol  Diet:  Cardiac thin liquids Activity:  Bedrest  DVT Prophylaxis:  lovenox  CLINICALLY SIGNIFICANT STUDIES Basic Metabolic Panel:  Recent Labs Lab 06/22/13 1258 06/22/13 1654  NA  --  137  K  --  4.0  CL  --  105  CO2  --  24  GLUCOSE  --  85  BUN  --  11  CREATININE 1.11 1.00  CALCIUM  --  9.0   Liver Function Tests: No results found for this basename: AST, ALT, ALKPHOS, BILITOT, PROT, ALBUMIN,  in the last 168 hours CBC:  Recent Labs Lab 06/22/13 1654  WBC 4.9  NEUTROABS 1.1*  HGB 13.6  HCT 37.5*  MCV 105.9*  PLT 216   Coagulation:  Recent Labs Lab 06/22/13 1654  LABPROT 13.0  INR 1.00   Cardiac Enzymes: No results found for this basename: CKTOTAL, CKMB, CKMBINDEX, TROPONINI,  in the last 168 hours Urinalysis: No results found for this basename: COLORURINE, APPERANCEUR, LABSPEC, PHURINE,  [place], HGBUR, [place], [place], PROTEINUR, UROBILINOGEN, NITRITE, LEUKOCYTESUR,  in the last 168 hours Lipid Panel    Component Value Date/Time   CHOL 150 06/23/2013 0520   TRIG 84 06/23/2013 0520   HDL 36* 06/23/2013 0520   CHOLHDL 4.2 06/23/2013 0520   VLDL 17 06/23/2013 0520   LDLCALC 97 06/23/2013 0520   HgbA1C  Lab Results  Component Value Date   HGBA1C 5.8* 06/22/2013    Urine Drug Screen:   No results found for this basename: labopia, cocainscrnur, labbenz, amphetmu, thcu, labbarb    Alcohol Level: No results found for this basename: ETH,  in the last 168 hours  Dg Chest 2 View  06/22/2013   CLINICAL DATA:  57-year- male with stroke. Initial encounter.  EXAM: CHEST  2 VIEW  COMPARISON:  06/18/2010.  FINDINGS:  Mildly increased cardiac silhouette on the PA view, borderline cardiomegaly on the lateral view. Other mediastinal contours are within normal limits. Visualized tracheal air column is within normal limits. Stable apical pleural/parenchymal scarring. No pneumothorax, pulmonary edema, pleural effusion or consolidation. No acute osseous abnormality identified.  IMPRESSION: Mild cardiomegaly has developed since 2011. No acute cardiopulmonary abnormality.   Electronically Signed   By: Augusto Gamble M.D.   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 The observed pattern of T2 hyperintensity and restricted diffusion would be consistent with an insult beginning 4 days earlier.  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 MRA intracranial recommended for further evaluation.  Prominent perivascular spaces in association with slight atrophy and white matter signal abnormality, likely sequelae of hypertensive cerebral vascular disease. Also small remote left parietal cortical infarct.    CT of the brain    CT Angio Neck  CT Angio Brain  MRA of the brain    2D Echocardiogram    Carotid Doppler    CXR    EKG NSR  Therapy Recommendations   Physical Exam  Alert, oriented, thought content appropriate. Speech fluent without evidence of aphasia. Able to follow 3 step commands without difficulty.  Cranial Nerves:  II: Discs flat bilaterally; Visual fields grossly normal, pupils equal, round, reactive to light and accommodation  III,IV, VI: ptosis not present, extra-ocular motions intact bilaterally  V,VII: smile symmetric, facial light touch sensation normal bilaterally  VIII: hearing normal bilaterally  IX,X: gag reflex  present  XI: bilateral shoulder shrug  XII: midline tongue extension without atrophy or fasciculations  Motor:  Right : Upper extremity 5/5 Left: Upper extremity 5/5  Lower extremity 5/5 Lower extremity 5/5  Tone and bulk:normal tone throughout; no atrophy noted  Sensory: Pinprick and light touch intact throughout, bilaterally  Deep Tendon Reflexes:  Right: Upper Extremity Left: Upper extremity  biceps (C-5 to C-6) 2/4 biceps (C-5 to C-6) 2/4  tricep (C7) 2/4 triceps (C7) 2/4  Brachioradialis (C6) 2/4 Brachioradialis (C6) 2/4  Lower Extremity Lower Extremity  quadriceps (L-2 to L-4) 2/4 quadriceps (L-2 to L-4) 2/4  Achilles (S1) 2/4 Achilles (S1) 2/4  Plantars:  Right: downgoing Left: downgoing  Cerebellar:  normal finger-to-nose, normal heel-to-shin test  Gait:  No ataxia.  CV: pulses palpable throughout    ASSESSMENT Daniel Gonzalez is a 57 y.o. male presenting with headache, visual field changes. Imaging  confirms a acute multifocal nonhemorrhagic left PCA territory infarction affecting the left posterior temporal lobe and occipital lobe. Infarct felt to be embolic secondary to unknown source.  On no antithrombotics prior to admission. Now on aspirin 325 mg orally every day for secondary stroke prevention. Patient with resultant field cut. Work up underway.   LDL 97, at goal, no statin indicated.  HGB A1C 5.8  Cluster headaches  Hypertension  HIV   Hospital day # 1  TREATMENT/PLAN  Continue aspirin 325 mg orally every day for secondary stroke prevention.  Risk factor modification  Vasculitic/hypercoag lab tests TEE to look for embolic source. I have set up. If positive for PFO (patent foramen ovale), check bilateral lower extremity venous dopplers to rule out DVT as possible source of stroke.  Outpatient OT evaluation recommended and do not drive.  Gwendolyn Lima. Manson Passey, [facility], [place], MBA, MHA [facility] Pager: [phone] 06/23/2013 2:52 PM  I have  personally obtained a history, examined the patient, evaluated imaging results, and formulated the assessment and plan of care. I agree with the above. Delia Heady, MD

## 2013-06-23 NOTE — Progress Notes (Addendum)
[facility]                                                                                Patient Demographics  Daniel Gonzalez, is a 57 y.o. male, DOB - 02-17-1956, ZOX:[id number]  Admit date - 06/22/2013   Admitting Physician Esperanza Sheets, MD  Outpatient Primary MD for the patient is Neena Rhymes, MD  LOS - 1   Chief Complaint  Patient presents with  . Visual Field Change        Assessment & Plan    Principal Problem:   Acute CVA (cerebrovascular accident) Active Problems:   HIV DISEASE   HTN (hypertension)  Visual Distrubance/CVA -Neurology consulted, pending stroke team to see patient -Pending MRI, echocardiogram, carotid Doppler -Will continue Plavix, as patient has an allergy to aspirin. 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  Disposition Plan: Admitted.   Procedures None  Consults  Neurology  DVT Prophylaxis  Lovenox   Lab Results  Component Value Date   PLT 216 06/22/2013    Medications  Scheduled Meds: . aspirin  325 mg Oral Daily  . emtricitabine-tenofovir  1 tablet Oral Q breakfast   And  . rilpivirine  25 mg Oral Q breakfast  . enoxaparin (LOVENOX) injection  40 mg Subcutaneous Q24H  . irbesartan  300 mg Oral Daily  . nebivolol  10 mg Oral Daily  . zidovudine  300 mg Oral BID   Continuous Infusions: . sodium chloride 50 mL/hr at 06/22/13 2330   PRN Meds:.acetaminophen, hydrALAZINE, naphazoline-pheniramine, Polyethyl Glycol-Propyl Glycol  Antibiotics    Anti-infectives   Start     Dose/Rate Route Frequency Ordered Stop   06/23/13 0800  emtricitabine-tenofovir (TRUVADA) 200-300 MG per tablet 1 tablet     1 tablet Oral Daily with  breakfast 06/23/13 0142     06/23/13 0800  rilpivirine (EDURANT) tablet 25 mg     25 mg Oral Daily with breakfast 06/23/13 0142     06/22/13 2200  zidovudine (RETROVIR) capsule 300 mg     300 mg Oral 2 times daily 06/22/13 1940     06/22/13 1945  Emtricitab-Rilpivir-Tenofovir 200-25-300 MG TABS  Status:  Discontinued      Oral Daily 06/22/13 1940 06/23/13 0142       Time Spent in minutes   30 minutes   Keely Drennan D.O. on 06/23/2013 at 1:20 PM  Between 7am to 7pm - Pager - [phone]  After 7pm go to [email or web address] - password [email or web address]  And look for the night coverage person covering for me after hours  [facility] Office  [phone]    Subjective:   Daniel Gonzalez seen and examined today.  Patient complains of headache and some neck pain.  He states his vision is still blurry.  Patient denies dizziness, chest pain, shortness of breath, abdominal pain, N/V/D/C, new weakness, numbess, tingling.    Objective:   Filed Vitals:  06/22/13 2323 06/23/13 0203 06/23/13 0630 06/23/13 0940  BP: 156/98 153/84 162/90 149/100  Pulse: 50 48 48 51  Temp: 98.2 F (36.8 C) 98.1 F (36.7 C) 98 F (36.7 C) 97.7 F (36.5 C)  TempSrc: Oral Oral Oral Oral  Resp: 18 18 20 20   Height:      Weight:      SpO2: 100% 100% 100% 97%    Wt Readings from Last 3 Encounters:  06/22/13 63.957 kg (141 lb)  06/22/13 63.617 kg (140 lb 4 oz)  06/20/13 64.127 kg (141 lb 6 oz)     Intake/Output Summary (Last 24 hours) at 06/23/13 1320 Last data filed at 06/23/13 1225  Gross per 24 hour  Intake    120 ml  Output    300 ml  Net   -180 ml    Exam  General: Well developed, well nourished, NAD, appears stated age  HEENT: NCAT, PERRLA, EOMI, Anicteic Sclera, mucous membranes moist.  Neck: Supple, no JVD, no masses  Cardiovascular: S1 S2 auscultated, no rubs, murmurs or gallops. Regular rate and rhythm.  Respiratory: Clear to auscultation bilaterally with equal chest  rise  Abdomen: Soft, nontender, nondistended, + bowel sounds  Extremities: warm dry without cyanosis clubbing or edema  Neuro: AAOx3, Strength 5/5 in patient's upper and lower extremities bilaterally.  Right peripheral visual field deficits.    Skin: Without rashes exudates or nodules  Psych: Normal affect and demeanor with intact judgement and insight  Data Review   Micro Results No results found for this or any previous visit (from the past 240 hour(s)).  Radiology Reports Dg Chest 2 View  06/22/2013   CLINICAL DATA:  57-year- male with stroke. Initial encounter.  EXAM: CHEST  2 VIEW  COMPARISON:  06/18/2010.  FINDINGS: Mildly increased cardiac silhouette on the PA view, borderline cardiomegaly on the lateral view. Other mediastinal contours are within normal limits. Visualized tracheal air column is within normal limits. Stable apical pleural/parenchymal scarring. No pneumothorax, pulmonary edema, pleural effusion or consolidation. No acute osseous abnormality identified.  IMPRESSION: Mild cardiomegaly has developed since 2011. No acute cardiopulmonary abnormality.   Electronically Signed   By: Augusto Gamble M.D.   On: 06/22/2013 21:56   Mr Laqueta Jean NF Contrast  06/22/2013   CLINICAL DATA:  Visual difficulty with headache and balance disturbance for 4 days.  EXAM: MRI HEAD WITHOUT AND WITH CONTRAST  TECHNIQUE: Multiplanar, multiecho pulse sequences of the brain and surrounding structures were obtained according to standard protocol without and with intravenous contrast  CONTRAST:  MultiHance 13 mL.  COMPARISON:  CT head 01/08/2006  FINDINGS: Small foci of acute infarction are observed in the left PCA territory affecting the left medial posterior temporal lobe and occipital lobe. Subcentimeter focus of restricted diffusion also involves the left paramedian splenium.  No visible acute hemorrhage, mass lesion, hydrocephalus, or extra-axial fluid. Mild premature cerebral and cerebellar atrophy for  the patient's age of 15. Prominent perivascular spaces suggesting longstanding hypertension. No foci of chronic hemorrhage. Increased T2 and FLAIR white matter hyperintensities, also premature for age, likely hypertensive related chronic microvascular ischemic change.  Remote cortical infarct affects the left posterior parietal lobe (image 16 series 6).  Normal pituitary, with mild cerebellar tonsillar ectopia. No osseous findings. Negative orbits. Mild chronic sinus disease. Mastoids unremarkable.  Flow voids are maintained in the internal carotid arteries and basilar artery. Both vertebrals appear to contribute to formation of the basilar. 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 Possible similar aneurysm 4 mm in size projecting laterally in the right cavernous sinus, versus tortuosity. Recommend MRA of the intracranial circulation for further evaluation.  Compared with prior CT, no abnormality was detected.  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 The observed pattern of T2 hyperintensity and restricted diffusion would be consistent with an insult beginning 4 days earlier.  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 MRA intracranial recommended for further evaluation.  Prominent perivascular spaces in association with slight atrophy and white matter signal abnormality, likely sequelae of hypertensive cerebral vascular disease. Also small remote left parietal cortical infarct.  These results will be called to the ordering clinician or representative by the Radiologist Assistant, and communication documented in the PACS Dashboard.   Electronically Signed   By: Davonna Belling M.D.   On: 06/22/2013 15:14    CBC  Recent Labs Lab 06/22/13 1654  WBC 4.9  HGB 13.6  HCT 37.5*  PLT 216  MCV 105.9*  MCH 38.4*  MCHC 36.3*  RDW 13.9  LYMPHSABS 3.3  MONOABS 0.4  EOSABS 0.1  BASOSABS 0.0    Chemistries   Recent Labs Lab 06/22/13 1258 06/22/13 1654  NA  --  137  K  --  4.0  CL  --  105  CO2  --  24  GLUCOSE  --  85  BUN  --  11  CREATININE 1.11 1.00  CALCIUM  --  9.0   ------------------------------------------------------------------------------------------------------------------ estimated creatinine clearance is 69.9 ml/min (by C-G formula based on Cr of 1). ------------------------------------------------------------------------------------------------------------------  Recent Labs  06/22/13 1654  HGBA1C 5.8*   ------------------------------------------------------------------------------------------------------------------  Recent Labs  06/23/13 0520  CHOL 150  HDL 36*  LDLCALC 97  TRIG 84  CHOLHDL 4.2   ------------------------------------------------------------------------------------------------------------------ No results found for this basename: TSH, T4TOTAL, FREET3, T3FREE, THYROIDAB,  in the last 72 hours ------------------------------------------------------------------------------------------------------------------ No results found for this basename: VITAMINB12, FOLATE, FERRITIN, TIBC, IRON, RETICCTPCT,  in the last 72 hours  Coagulation profile  Recent Labs Lab 06/22/13 1654  INR 1.00    No results found for this basename: DDIMER,  in the last 72 hours  Cardiac Enzymes No results found for this basename: CK, CKMB, TROPONINI, MYOGLOBIN,  in the last 168 hours ------------------------------------------------------------------------------------------------------------------ No components found with this basename: POCBNP,

## 2013-06-23 NOTE — Evaluation (Signed)
Physical Therapy Evaluation Patient Details Name: Daniel Gonzalez MRN: [id number] DOB: August 05, 1956 Today's Date: 06/23/2013 Time: 1009-1020 PT Time Calculation (min): 11 min  PT Assessment / Plan / Recommendation History of Present Illness  pt presents with visual deficits and found to have Non-hemmorhagic PCA infarcts.    Clinical Impression  Pt presents with mobility at baseline and only deficits noted are with vision,  Will sign off PT.  No further needs at this time.      PT Assessment  Patent does not need any further PT services    Follow Up Recommendations  No PT follow up    Does the patient have the potential to tolerate intense rehabilitation      Barriers to Discharge        Equipment Recommendations  None recommended by PT    Recommendations for Other Services     Frequency      Precautions / Restrictions Precautions Precautions: None Restrictions Weight Bearing Restrictions: No   Pertinent Vitals/Pain Denied pain.  C/o feeling SOB, but sats 98% on RA with amb 300'.  RN aware.        Mobility  Bed Mobility Bed Mobility: Supine to Sit;Sitting - Scoot to Edge of Bed Supine to Sit: 7: Independent Sitting - Scoot to Edge of Bed: 7: Independent Transfers Transfers: Sit to Stand;Stand to Sit Sit to Stand: 7: Independent;From bed Stand to Sit: 7: Independent;To chair/3-in-1 Ambulation/Gait Ambulation/Gait Assistance: 7: Independent Ambulation Distance (Feet): 300 Feet Assistive device: None Ambulation/Gait Assistance Details: No gait deviations noted.  pt only c/o feeling SOB.  O2 sats on RA 98%.   Gait Pattern: Within Functional Limits Stairs: Yes Stairs Assistance: 6: Modified independent (Device/Increase time) Stair Management Technique: One rail Right;Forwards Number of Stairs: 5 Wheelchair Mobility Wheelchair Mobility: No Modified Rankin (Stroke Patients Only) Pre-Morbid Rankin Score: No symptoms Modified Rankin: Slight disability    Exercises      PT Diagnosis:    PT Problem List:   PT Treatment Interventions:       PT Goals(Current goals can be found in the care plan section)    Visit Information  Last PT Received On: 06/23/13 Assistance Needed: +1 History of Present Illness: pt presents with visual deficits and found to have Non-hemmorhagic PCA infarcts.         Prior Functioning  Home Living Family/patient expects to be discharged to:: Private residence Living Arrangements: Alone Available Help at Discharge: Family;Available PRN/intermittently Type of Home: House Home Access: Stairs to enter [employer] of Steps: few Home Layout: One level Home Equipment: None Prior Function Level of Independence: Independent Comments: pt works as a Designer, industrial/product.   Communication Communication: No difficulties    Cognition  Cognition Arousal/Alertness: Awake/alert Behavior During Therapy: WFL for tasks assessed/performed Overall Cognitive Status: Within Functional Limits for tasks assessed    Extremity/Trunk Assessment Upper Extremity Assessment Upper Extremity Assessment: Defer to OT evaluation Lower Extremity Assessment Lower Extremity Assessment: Overall WFL for tasks assessed Cervical / Trunk Assessment Cervical / Trunk Assessment: Normal   Balance Balance Balance Assessed: No  End of Session PT - End of Session Equipment Utilized During Treatment: Gait belt Activity Tolerance: Patient tolerated treatment well Patient left: in chair;with call bell/phone within reach Nurse Communication: Mobility status  GP     RitenourAlison Murray, _PT [phone] 06/23/2013, 10:50 AM

## 2013-06-23 NOTE — Progress Notes (Signed)
*  PRELIMINARY RESULTS* Vascular Ultrasound Carotid Duplex (Doppler) has been completed.  Preliminary findings: Bilateral:  1-39% ICA stenosis.  Vertebral artery flow is antegrade.      Farrel Demark, RDMS, RVT  06/23/2013, 4:03 PM

## 2013-06-23 NOTE — Progress Notes (Signed)
  Echocardiogram 2D Echocardiogram has been performed.  Daniel Gonzalez FRANCES 06/23/2013, 4:11 PM

## 2013-06-23 NOTE — Evaluation (Signed)
SLP Cancellation Note  Patient Details Name: Daniel Gonzalez MRN: [id number] DOB: 10-28-1955   Cancelled treatment:       Reason Eval/Treat Not Completed: Other (comment) (pt was working with OT at time of SLP attempt to see pt)   Donavan Burnet, MS [facility] SLP [phone]

## 2013-06-23 NOTE — Evaluation (Signed)
Occupational Therapy Evaluation Patient Details Name: Daniel Gonzalez MRN: [id number] DOB: 08/31/1955 Today's Date: 06/23/2013 Time: 4098-1191 OT Time Calculation (min): 21 min  OT Assessment / Plan / Recommendation History of present illness pt presents with visual deficits and found to have Non-hemmorhagic PCA infarcts.MRI (+) LT PCA infarct, LT posterior temporal infarct, LT occipital lobe, remote Lt parietal cortical infarct     Clinical Impression   PT admitted with LT PCA infarct , lt occipital infarct, lt temporal infarct. Pt currently with functional limitiations due to the deficits listed below (see OT problem list).  Pt will benefit from skilled OT to increase their independence and safety with adls and balance to allow discharge outpatient for vision.     OT Assessment  Patient needs continued OT Services    Follow Up Recommendations  Outpatient OT (vision- recommend peripheral 120.3 assessment)    Barriers to Discharge      Equipment Recommendations  None recommended by OT    Recommendations for Other Services    Frequency  Min 2X/week    Precautions / Restrictions Precautions Precautions: None Restrictions Weight Bearing Restrictions: No   Pertinent Vitals/Pain No pain  HA  Questions about when "swirling circle" will stop    ADL  Grooming: Wash/dry hands;Wash/dry face;Teeth care;Independent Where Assessed - Grooming: Unsupported standing Toilet Transfer: Independent Toilet Transfer Method: Sit to Barista: Regular height toilet ADL Comments: Pt observed ambulating with PT and transferring into the chair. Pt describes visual changes see below. Session focused on vision.     OT Diagnosis:    OT Problem List:   OT Treatment Interventions:     OT Goals(Current goals can be found in the care plan section) Acute Rehab OT Goals Patient Stated Goal: to make the swirling circle stop OT Goal Formulation: With patient Time For Goal  Achievement: 07/07/13 Potential to Achieve Goals: Good  Visit Information  Last OT Received On: 06/23/13 Assistance Needed: +1 History of Present Illness: pt presents with visual deficits and found to have Non-hemmorhagic PCA infarcts.MRI (+) LT PCA infarct, LT posterior temporal infarct, LT occipital lobe, remote Lt parietal cortical infarct         Prior Functioning     Home Living Family/patient expects to be discharged to:: Private residence Living Arrangements: Alone Available Help at Discharge: Family;Available PRN/intermittently Type of Home: House Home Access: Stairs to enter [employer] of Steps: few Home Layout: One level Home Equipment: None Prior Function Level of Independence: Independent Comments: pt works as a Designer, industrial/product.   Communication Communication: No difficulties Dominant Hand: Left         Vision/Perception Vision - History Baseline Vision: No visual deficits Patient Visual Report: Peripheral vision impairment (swirling circle in Rt superior field) Vision - Assessment Eye Alignment: Impaired (comment) Vision Assessment: Vision tested Ocular Range of Motion: Restricted on the right;Restricted looking up (on the Rt eye) Alignment/Gaze Preference: Head tilt;Head turned Tracking/Visual Pursuits: Right eye does not track laterally;Decreased smoothness of eye movement to RIGHT superior field;Impaired - to be further tested in functional context;Requires cues, head turns, or add eye shifts to track Convergence: Impaired (comment) Additional Comments: Pt with Rt eye occluded first and LT eye assessed: Pt able to track pen in all quads with Rt superior quad with decr pursuit. Pt describes it as going into the circle. Pt states I can see the swirling circle even with my eye closed. Pt can close bil eyes and see the swirl. Pt with LT eye  occluded and Rt eye assessed: Pt with decr smooth pursuits in Rt superior quad. Pt states "see my eye jumped over  there and the pen is over here" Pt with rt eye moving lateral into peripherial vision with pen in central vision. Pt was able to read a sentence with eye turns in central vision, Pt completed a small number font ( 12 size) cancellation and a letter cancellation.    Cognition  Cognition Arousal/Alertness: Awake/alert Behavior During Therapy: WFL for tasks assessed/performed Overall Cognitive Status: Within Functional Limits for tasks assessed    Extremity/Trunk Assessment Upper Extremity Assessment Upper Extremity Assessment: Overall WFL for tasks assessed Lower Extremity Assessment Lower Extremity Assessment: Defer to PT evaluation Cervical / Trunk Assessment Cervical / Trunk Assessment: Normal     Mobility Bed Mobility Bed Mobility: Not assessed Supine to Sit: 7: Independent Sitting - Scoot to Edge of Bed: 7: Independent Transfers Transfers: Sit to Stand;Stand to Sit Sit to Stand: 7: Independent Stand to Sit: 7: Independent;To chair/3-in-1     Exercise     Balance Balance Balance Assessed: No   End of Session OT - End of Session Activity Tolerance: Patient tolerated treatment well Patient left: in chair;with call bell/phone within reach Nurse Communication: Mobility status;Precautions  GO     [name] 06/23/2013, 11:29 AM Pager: [phone]

## 2013-06-23 NOTE — Progress Notes (Signed)
Occupational Therapy Treatment Patient Details Name: Daniel Gonzalez MRN: [id number] DOB: 1956/06/15 Today's Date: 06/23/2013 Time: 7829-5621 OT Time Calculation (min): 21 min  OT Assessment / Plan / Recommendation  History of present illness pt presents with visual deficits and found to have Non-hemmorhagic PCA infarcts.MRI (+) LT PCA infarct, LT posterior temporal infarct, LT occipital lobe, remote Lt parietal cortical infarct     OT comments  Pt demonstrates Rt. 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  Field loss appears to be ~45-60 of field with remainder of periphery appearing intact per very gross confrontation testing.  Pt will need more formal evaluation by ophthalmology - Humphrey's 120.3 to assess visual field.  Pt is very aware of deficit.  Recommend No driving, and he will benefit from OPOT to work with pt on compensation for field deficit  Follow Up Recommendations  Outpatient OT;Other (comment) (ophthalmology eval Humprhey's 120.3)    Barriers to Discharge       Equipment Recommendations  None recommended by OT    Recommendations for Other Services    Frequency Min 2X/week   Progress towards OT Goals Progress towards OT goals: Progressing toward goals  Plan Discharge plan remains appropriate    Precautions / Restrictions Precautions Precautions: None Restrictions Weight Bearing Restrictions: No   Pertinent Vitals/Pain     ADL  Grooming: Wash/dry hands;Wash/dry face;Teeth care;Independent Where Assessed - Grooming: Unsupported standing Toilet Transfer: Independent Toilet Transfer Method: Sit to Barista: Regular height toilet ADL Comments: Further visual assesment performed as pt is describing visual field deficit with ? Bonnet's phenomenon.  Pt  reports cirles and spirals in Rt. visual field.  He reports difficulty with driving and not being able to see cars on  the Rt. due to the circles and spirals being where the cars should be.  Occulomotor pursuits WFL, but pt reports he "loses" object in lower Rt quadrant, and upper Rt. quadrant.  Confrontation testing revealed HH ~45-60 degrees of field.  Outside of that Range, periphery appears intact to confrontation testing.  Needs formal assesment by ophthalmologist    OT Diagnosis:    OT Problem List:   OT Treatment Interventions:     OT Goals(current goals can now be found in the care plan section) Acute Rehab OT Goals Patient Stated Goal: to make the swirling circle stop OT Goal Formulation: With patient Time For Goal Achievement: 07/07/13 Potential to Achieve Goals: Good  Visit Information  Last OT Received On: 06/23/13 Assistance Needed: +1 History of Present Illness: pt presents with visual deficits and found to have Non-hemmorhagic PCA infarcts.MRI (+) LT PCA infarct, LT posterior temporal infarct, LT occipital lobe, remote Lt parietal cortical infarct      Subjective Data      Prior Functioning  Home Living Family/patient expects to be discharged to:: Private residence Living Arrangements: Alone Available Help at Discharge: Family;Available PRN/intermittently Type of Home: House Home Access: Stairs to enter [employer] of Steps: few Home Layout: One level Home Equipment: None Prior Function Level of Independence: Independent Comments: pt works as a Designer, industrial/product.   Communication Communication: No difficulties Dominant Hand: Left    Cognition  Cognition Arousal/Alertness: Awake/alert Behavior During Therapy: WFL for tasks assessed/performed Overall Cognitive Status: Within Functional Limits for tasks assessed    Mobility  Bed Mobility Bed Mobility: Not assessed Transfers Transfers: Sit to Stand;Stand to Sit Sit to Stand: 7: Independent Stand to Sit: 7: Independent;To chair/3-in-1  Exercises      Balance     End of Session OT - End of Session Activity  Tolerance: Patient tolerated treatment well Patient left: in bed;with call bell/phone within reach Nurse Communication: Other (comment) (spoke with MD re: visual field loss)  GO     Hebert Dooling M 06/23/2013, 2:14 PM

## 2013-06-24 ENCOUNTER — Ambulatory Visit: Payer: 59 | Admitting: Neurology

## 2013-06-24 ENCOUNTER — Encounter: Payer: Self-pay | Admitting: Neurology

## 2013-06-24 ENCOUNTER — Encounter (HOSPITAL_COMMUNITY): Payer: Self-pay

## 2013-06-24 ENCOUNTER — Encounter (HOSPITAL_COMMUNITY)
Admission: EM | Disposition: A | Discharge status: Home Health | Payer: Self-pay | Source: Home / Self Care | Attending: Internal Medicine

## 2013-06-24 ENCOUNTER — Ambulatory Visit: Payer: 59 | Admitting: Family Medicine

## 2013-06-24 DIAGNOSIS — F17200 Nicotine dependence, unspecified, uncomplicated: Secondary | ICD-10-CM

## 2013-06-24 DIAGNOSIS — G459 Transient cerebral ischemic attack, unspecified: Secondary | ICD-10-CM

## 2013-06-24 HISTORY — PX: TEE WITHOUT CARDIOVERSION: SHX5443

## 2013-06-24 LAB — LUPUS ANTICOAGULANT PANEL

## 2013-06-24 LAB — C4 COMPLEMENT: Complement C4, Body Fluid: 26 mg/dL (ref 10–40)

## 2013-06-24 LAB — PROTEIN C, TOTAL: Protein C, Total: 76 % (ref 72–160)

## 2013-06-24 LAB — HIV ANTIBODY (ROUTINE TESTING W REFLEX): HIV: REACTIVE — AB

## 2013-06-24 SURGERY — ECHOCARDIOGRAM, TRANSESOPHAGEAL
Anesthesia: Moderate Sedation

## 2013-06-24 MED ORDER — MIDAZOLAM HCL 10 MG/2ML IJ SOLN
INTRAMUSCULAR | Status: DC | PRN
  Administered 2013-06-24: 2 mg via INTRAVENOUS
  Administered 2013-06-24: 1 mg via INTRAVENOUS
  Administered 2013-06-24 (×3): 2 mg via INTRAVENOUS

## 2013-06-24 MED ORDER — SODIUM CHLORIDE 0.9 % IV SOLN
INTRAVENOUS | Status: DC
  Administered 2013-06-24: 12:00:00 via INTRAVENOUS

## 2013-06-24 MED ORDER — MIDAZOLAM HCL 5 MG/ML IJ SOLN
INTRAMUSCULAR | Status: AC
  Filled 2013-06-24: qty 2

## 2013-06-24 MED ORDER — FENTANYL CITRATE 0.05 MG/ML IJ SOLN
INTRAMUSCULAR | Status: DC | PRN
  Administered 2013-06-24 (×4): 25 ug via INTRAVENOUS

## 2013-06-24 MED ORDER — ONDANSETRON HCL 4 MG/2ML IJ SOLN
4.0000 mg | INTRAMUSCULAR | Status: DC | PRN
  Administered 2013-06-24: 4 mg via INTRAVENOUS
  Filled 2013-06-24: qty 2

## 2013-06-24 MED ORDER — FENTANYL CITRATE 0.05 MG/ML IJ SOLN
INTRAMUSCULAR | Status: AC
  Filled 2013-06-24: qty 2

## 2013-06-24 NOTE — Progress Notes (Signed)
Complained of N&V. Per patient, 3 episodes of vomiting. Paged for PRN med, zofran given at 2031 with relief. Daniel Gonzalez A

## 2013-06-24 NOTE — CV Procedure (Signed)
    TRANSESOPHAGEAL ECHOCARDIOGRAM   NAME:  ZAKHAI MEISINGER   MRN: [id number] DOB:  24-Nov-1955   ADMIT DATE: 06/22/2013  INDICATIONS: CVA   PROCEDURE:   Informed consent was obtained prior to the procedure. The risks, benefits and alternatives for the procedure were discussed and the patient comprehended these risks.  Risks include, but are not limited to, cough, sore throat, vomiting, nausea, somnolence, esophageal and stomach trauma or perforation, bleeding, low blood pressure, aspiration, pneumonia, infection, trauma to the teeth and death.    After a procedural time-out, the patient was given 9 mg versed and 100 mcg fentanyl for moderate sedation.  The oropharynx was anesthetized topical cetacaine.  The transesophageal probe was inserted in the esophagus and stomach without difficulty and multiple views were obtained.    COMPLICATIONS:    There were no immediate complications.  FINDINGS:  LEFT VENTRICLE: EF = 55%. No regional wall motion abnormalities.  RIGHT VENTRICLE: Normal size and function.   LEFT ATRIUM: Normal  LEFT ATRIAL APPENDAGE: No thrombus.   RIGHT ATRIUM: Normal  AORTIC VALVE:  Trileaflet. No AI/AS  MITRAL VALVE:    Normal. Trivial MR  TRICUSPID VALVE: Normal. Trivial TR  PULMONIC VALVE: Normal. Mild PI  INTERATRIAL SEPTUM: No PFO or ASD. Bubble study negative  PERICARDIUM: No effusion  DESCENDING AORTA: Mild plaque   CONCLUSION:   No obvious cardiac source of stroke.  Lindsay Soulliere,MD 2:57 PM

## 2013-06-24 NOTE — Progress Notes (Signed)
[facility]                                                                                Patient Demographics  Daniel Gonzalez, is a 57 y.o. male, DOB - [birth year], NWG:[id number]  Admit date - 06/22/2013   Admitting Physician Daniel Sheets, MD  Outpatient Primary MD for the patient is Daniel Rhymes, MD  LOS - 2   Chief Complaint  Patient presents with  . Visual Field Change        Assessment & Plan    Principal Problem:   Acute CVA (cerebrovascular accident) Active Problems:   HIV DISEASE   HTN (hypertension)  Visual Distrubance/CVA -Neurology consulted, pending stroke team to see patient -Will continue Plavix, as patient has an allergy to aspirin. 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  HTN, uncontrolled -Continue bystolic, valsartan/titrate and prn hydralazine  HIV -Continue  cont home regimen; last CD 4-->860 (04/2013)  Code Status: Full  Family Communication: Sisters at bedside.  Disposition Plan: Admitted.   Procedures  2D Echocardiogram Study Conclusions - Left ventricle: Systolic function was normal. The estimated ejection fraction was in the range of 50% to 55%. Wall motion was normal; there were no regional wall motion abnormalities. Left ventricular diastolic function parameters were normal. - Atrial septum: No defect or patent foramen ovale was identified.  Consults   Neurology  DVT Prophylaxis  Lovenox   Lab Results  Component Value Date   PLT 216 06/22/2013    Medications  Scheduled Meds: . emtricitabine-tenofovir  1 tablet Oral Q breakfast   And  . rilpivirine  25 mg Oral Q breakfast  . enoxaparin (LOVENOX) injection  40 mg Subcutaneous Q24H  . irbesartan  300 mg Oral Daily  . nebivolol  10 mg Oral Daily  . zidovudine  300 mg Oral BID    Continuous Infusions: . sodium chloride 50 mL/hr at 06/22/13 2330  . sodium chloride     PRN Meds:.acetaminophen, hydrALAZINE, naphazoline-pheniramine, Polyethyl Glycol-Propyl Glycol  Antibiotics    Anti-infectives   Start     Dose/Rate Route Frequency Ordered Stop   06/23/13 0800  emtricitabine-tenofovir (TRUVADA) 200-300 MG per tablet 1 tablet     1 tablet Oral Daily with breakfast 06/23/13 0142     06/23/13 0800  rilpivirine (EDURANT) tablet 25 mg     25 mg Oral Daily with breakfast 06/23/13 0142     06/22/13 2200  zidovudine (RETROVIR) capsule 300 mg     300 mg Oral 2 times daily 06/22/13 1940     06/22/13 1945  Emtricitab-Rilpivir-Tenofovir 200-25-300 MG TABS  Status:  Discontinued      Oral Daily 06/22/13 1940 06/23/13 0142       Time Spent in minutes   20 minutes   Tillmon Gonzalez D.O. on 06/24/2013 at 12:11 PM  Between 7am to 7pm - Pager - [phone]  After 7pm go to [email or web address] - password [email or web address]  And look for the night coverage person covering for me after hours  [facility]  Office  [phone]    Subjective:   Daniel Gonzalez seen and examined today.  Patient continues to complain of headache.  He currently complains of "swirls" in his vision.  Patient denies dizziness, chest pain, shortness of breath, abdominal pain, N/V/D/C, new weakness, numbess, tingling.    Objective:   Filed Vitals:   06/23/13 1729 06/23/13 2216 06/24/13 0700 06/24/13 0948  BP: 173/93 165/91 164/98 158/95  Pulse: 55 56 51   Temp: 98 F (36.7 C) 97.9 F (36.6 C) 97.7 F (36.5 C) 97.9 F (36.6 C)  TempSrc: Oral Oral Oral Oral  Resp: 20 18 18 17   Height:      Weight:      SpO2: 100% 100% 97% 99%    Wt Readings from Last 3 Encounters:  06/22/13 63.957 kg (141 lb)  06/22/13 63.957 kg (141 lb)  06/22/13 63.617 kg (140 lb 4 oz)     Intake/Output Summary (Last 24 hours) at 06/24/13 1211 Last data filed at 06/23/13 1225  Gross per 24 hour  Intake      0 ml   Output    300 ml  Net   -300 ml    Exam  General: Well developed, well nourished, NAD, appears stated age  HEENT: NCAT, mucous membranes moist.  Neck: Supple, no JVD, no masses  Cardiovascular: S1 S2 auscultated, no rubs, murmurs or gallops. Regular rate and rhythm.  Respiratory: Clear to auscultation bilaterally with equal chest rise  Abdomen: Soft, nontender, nondistended, + bowel sounds  Extremities: warm dry without cyanosis clubbing or edema  Neuro: AAOx3, Strength 5/5 in patient's upper and lower extremities bilaterally.  Right peripheral visual field deficits.    Skin: Without rashes exudates or nodules  Psych: Normal affect and demeanor with intact judgement and insight  Data Review   Micro Results No results found for this or any previous visit (from the past 240 hour(s)).  Radiology Reports Dg Chest 2 View  06/22/2013   CLINICAL DATA:  57-year- male with stroke. Initial encounter.  EXAM: CHEST  2 VIEW  COMPARISON:  06/18/2010.  FINDINGS: Mildly increased cardiac silhouette on the PA view, borderline cardiomegaly on the lateral view. Other mediastinal contours are within normal limits. Visualized tracheal air column is within normal limits. Stable apical pleural/parenchymal scarring. No pneumothorax, pulmonary edema, pleural effusion or consolidation. No acute osseous abnormality identified.  IMPRESSION: Mild cardiomegaly has developed since 2011. No acute cardiopulmonary abnormality.   Electronically Signed   By: Augusto Gamble M.D.   On: 06/22/2013 21:56   Mr Daniel Gonzalez WU Contrast  06/22/2013   CLINICAL DATA:  Visual difficulty with headache and balance disturbance for 4 days.  EXAM: MRI HEAD WITHOUT AND WITH CONTRAST  TECHNIQUE: Multiplanar, multiecho pulse sequences of the brain and surrounding structures were obtained according to standard protocol without and with intravenous contrast  CONTRAST:  MultiHance 13 mL.  COMPARISON:  CT head 01/08/2006  FINDINGS: Small foci of  acute infarction are observed in the left PCA territory affecting the left medial posterior temporal lobe and occipital lobe. Subcentimeter focus of restricted diffusion also involves the left paramedian splenium.  No visible acute hemorrhage, mass lesion, hydrocephalus, or extra-axial fluid. Mild premature cerebral and cerebellar atrophy for the patient's age of 18. Prominent perivascular spaces suggesting longstanding hypertension. No foci of chronic hemorrhage. Increased T2 and FLAIR white matter hyperintensities, also premature for age, likely hypertensive related chronic microvascular ischemic change.  Remote cortical infarct affects the left posterior parietal lobe (image 16  series 6).  Normal pituitary, with mild cerebellar tonsillar ectopia. No osseous findings. Negative orbits. Mild chronic sinus disease. Mastoids unremarkable.  Flow voids are maintained in the internal carotid arteries and basilar artery. Both vertebrals appear to contribute to formation of the basilar. 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 Possible similar aneurysm 4 mm in size projecting laterally in the right cavernous sinus, versus tortuosity. Recommend MRA of the intracranial circulation for further evaluation.  Compared with prior CT, no abnormality was detected.  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 The observed pattern of T2 hyperintensity and restricted diffusion would be consistent with an insult beginning 4 days earlier.  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 MRA intracranial recommended for further evaluation.  Prominent perivascular spaces in association with slight  atrophy and white matter signal abnormality, likely sequelae of hypertensive cerebral vascular disease. Also small remote left parietal cortical infarct.  These results will be called to the ordering clinician or representative by the Radiologist Assistant, and communication documented in the PACS Dashboard.   Electronically Signed   By: Davonna Belling M.D.   On: 06/22/2013 15:14    CBC  Recent Labs Lab 06/22/13 1654  WBC 4.9  HGB 13.6  HCT 37.5*  PLT 216  MCV 105.9*  MCH 38.4*  MCHC 36.3*  RDW 13.9  LYMPHSABS 3.3  MONOABS 0.4  EOSABS 0.1  BASOSABS 0.0    Chemistries   Recent Labs Lab 06/22/13 1258 06/22/13 1654  NA  --  137  K  --  4.0  CL  --  105  CO2  --  24  GLUCOSE  --  85  BUN  --  11  CREATININE 1.11 1.00  CALCIUM  --  9.0   ------------------------------------------------------------------------------------------------------------------ estimated creatinine clearance is 69.9 ml/min (by C-G formula based on Cr of 1). ------------------------------------------------------------------------------------------------------------------  Recent Labs  06/22/13 1654 06/23/13 0520  HGBA1C 5.8* 5.7*   ------------------------------------------------------------------------------------------------------------------  Recent Labs  06/23/13 0520  CHOL 150  HDL 36*  LDLCALC 97  TRIG 84  CHOLHDL 4.2   ------------------------------------------------------------------------------------------------------------------ No results found for this basename: TSH, T4TOTAL, FREET3, T3FREE, THYROIDAB,  in the last 72 hours ------------------------------------------------------------------------------------------------------------------ No results found for this basename: VITAMINB12, FOLATE, FERRITIN, TIBC, IRON, RETICCTPCT,  in the last 72 hours  Coagulation profile  Recent Labs Lab 06/22/13 1654  INR 1.00    No results found for this basename: DDIMER,  in the last 72  hours  Cardiac Enzymes No results found for this basename: CK, CKMB, TROPONINI, MYOGLOBIN,  in the last 168 hours ------------------------------------------------------------------------------------------------------------------ No components found with this basename: POCBNP,

## 2013-06-24 NOTE — Progress Notes (Signed)
Occupational Therapy Treatment Patient Details Name: Daniel Gonzalez MRN: [id number] DOB: 1956-08-18 Today's Date: 06/24/2013 Time: 4098-1191 OT Time Calculation (min): 17 min  OT Assessment / Plan / Recommendation  History of present illness pt presents with visual deficits and found to have Non-hemmorhagic PCA infarcts.MRI (+) LT PCA infarct, LT posterior temporal infarct, LT occipital lobe, remote Lt parietal cortical infarct     OT comments  Pt continues to have visual deficits. Pt educated on compensatory strategies of using Rt superior quad pin wheel as a guide to keep turning his head to make sure he is seeing all objects in that visual field. Pt educated on need for visual assessment follow up and outpatient therapy. Larita Fife PA informed by OT.   Follow Up Recommendations  Outpatient OT;Other (comment) (humphrey's 120.3 assessment vision)    Barriers to Discharge       Equipment Recommendations  None recommended by OT    Recommendations for Other Services    Frequency Min 2X/week   Progress towards OT Goals Progress towards OT goals: Progressing toward goals  Plan Discharge plan remains appropriate    Precautions / Restrictions Precautions Precautions: None   Pertinent Vitals/Pain Reports decr intensity of pin wheel swirling circle    ADL  ADL Comments: Pt reports decr size of swirling circle and reports that it incr in intensity when he looks to the right superior quad. pt educated on the need to have a humprey's 120.3 assessment prior to any driving. Pt encouraged to talk to MD about return to driving. pt very fearful of losing job. pt asked if his job had FLMA and to check on policies to help him understand resources that are available to him. Pt educated that he is not allowed to return to driving until MD clears him. Pt educated what the [employer] 120.3 purpose to help him learn to compensate for visual deficits. Pt expressed feeling better knowing that staff understand his  visual changes and images he is seeing. pt reports initially seeing what he thought were people then it came a swirling circle ( like a pin wheel) Pt without further questions at this time.    OT Diagnosis:    OT Problem List:   OT Treatment Interventions:     OT Goals(current goals can now be found in the care plan section) Acute Rehab OT Goals Patient Stated Goal: to make the swirling circle stop OT Goal Formulation: With patient Time For Goal Achievement: 07/07/13 Potential to Achieve Goals: Good  Visit Information  Last OT Received On: 06/24/13 Assistance Needed: +1 History of Present Illness: pt presents with visual deficits and found to have Non-hemmorhagic PCA infarcts.MRI (+) LT PCA infarct, LT posterior temporal infarct, LT occipital lobe, remote Lt parietal cortical infarct      Subjective Data      Prior Functioning       Cognition  Cognition Arousal/Alertness: Awake/alert Behavior During Therapy: WFL for tasks assessed/performed Overall Cognitive Status: Within Functional Limits for tasks assessed                    End of Session OT - End of Session Activity Tolerance: Patient tolerated treatment well Patient left: in bed;with call bell/phone within reach Nurse Communication: Mobility status;Precautions  GO     [name] 06/24/2013, 2:33 PM Pager: [phone]

## 2013-06-24 NOTE — Progress Notes (Signed)
SLP Cancellation Note  Patient Details Name: Daniel Gonzalez MRN: [id number] DOB: August 14, 1956   Cancelled treatment:       Reason Eval/Treat Not Completed: Patient at procedure or test/unavailable will follow-up as able.   Fae Pippin, M.A., CCC-SLP [phone]  Hitomi Slape 06/24/2013, 3:16 PM

## 2013-06-24 NOTE — Progress Notes (Signed)
Stroke Team Progress Note  HISTORY Daniel Gonzalez is an 57 y.o. male with a past medical history significant for HTN, HIV infection, cluster headache, migraine, heavy smoking, who was in his usual state of health until 5 days ago when he started having " visual changes like a colorful circle that comes and goes".  He never had similar symptoms before but tells me that last Friday he woke up with a HA and then started experiencing intermittent visual changes that he describes as " a colorful circle, like the ones they use in the Christmas tree" that is off and on, every 5 minutes. He said that he is not having visual loss or double vision but it is almost impossible to drive.   Denies associated vertigo, focal weakness or numbness, difficulty swallowing, slurred speech, unsteadiness, confusion, chest pain, SOB, or palpitations.  A brain MRI was ordered as outpatient and today he got a phone call advising his to come to the hospital for further evaluation of an abnormal MRI.  I reviewed his brain MRI which showed an acute multifocal nonhemorrhagic left PCA territory infarction affecting the left posterior temporal lobe and occipital lobe.   Patient was not a TPA candidate secondary to symptoms outside of tPA window. He was admitted for further evaluation and treatment.   SUBJECTIVE He is lying in bed. Overall he feels his condition is rapidly improving. No new neurological changes. Feeling stronger.   OBJECTIVE Most recent Vital Signs: Filed Vitals:   06/23/13 1339 06/23/13 1729 06/23/13 2216 06/24/13 0700  BP: 161/91 173/93 165/91 164/98  Pulse: 61 55 56 51  Temp: 97.7 F (36.5 C) 98 F (36.7 C) 97.9 F (36.6 C) 97.7 F (36.5 C)  TempSrc: Oral Oral Oral Oral  Resp: 20 20 18 18   Height:      Weight:      SpO2: 100% 100% 100% 97%   CBG (last 3)  No results found for this basename: GLUCAP,  in the last 72 hours  IV Fluid Intake:   . sodium chloride 50 mL/hr at 06/22/13 2330     MEDICATIONS  . emtricitabine-tenofovir  1 tablet Oral Q breakfast   And  . rilpivirine  25 mg Oral Q breakfast  . enoxaparin (LOVENOX) injection  40 mg Subcutaneous Q24H  . irbesartan  300 mg Oral Daily  . nebivolol  10 mg Oral Daily  . zidovudine  300 mg Oral BID   PRN:  acetaminophen, hydrALAZINE, naphazoline-pheniramine, Polyethyl Glycol-Propyl Glycol  Diet:  NPO thin liquids Activity:  Bedrest  DVT Prophylaxis:  lovenox  CLINICALLY SIGNIFICANT STUDIES Basic Metabolic Panel:   Recent Labs Lab 06/22/13 1258 06/22/13 1654  NA  --  137  K  --  4.0  CL  --  105  CO2  --  24  GLUCOSE  --  85  BUN  --  11  CREATININE 1.11 1.00  CALCIUM  --  9.0   Liver Function Tests: No results found for this basename: AST, ALT, ALKPHOS, BILITOT, PROT, ALBUMIN,  in the last 168 hours CBC:   Recent Labs Lab 06/22/13 1654  WBC 4.9  NEUTROABS 1.1*  HGB 13.6  HCT 37.5*  MCV 105.9*  PLT 216   Coagulation:   Recent Labs Lab 06/22/13 1654  LABPROT 13.0  INR 1.00   Cardiac Enzymes: No results found for this basename: CKTOTAL, CKMB, CKMBINDEX, TROPONINI,  in the last 168 hours Urinalysis: No results found for this basename: COLORURINE, APPERANCEUR, LABSPEC, PHURINE, GLUCOSEU,  HGBUR, BILIRUBINUR, KETONESUR, PROTEINUR, UROBILINOGEN, NITRITE, LEUKOCYTESUR,  in the last 168 hours Lipid Panel    Component Value Date/Time   CHOL 150 06/23/2013 0520   TRIG 84 06/23/2013 0520   HDL 36* 06/23/2013 0520   CHOLHDL 4.2 06/23/2013 0520   VLDL 17 06/23/2013 0520   LDLCALC 97 06/23/2013 0520   HgbA1C  Lab Results  Component Value Date   HGBA1C 5.7* 06/23/2013    Urine Drug Screen:      Component Value Date/Time   LABOPIA NONE DETECTED 06/23/2013 1227    Alcohol Level: No results found for this basename: ETH,  in the last 168 hours  Dg Chest 2 View 06/22/2013 Mild cardiomegaly has developed since 2011. No acute cardiopulmonary abnormality.     Mr Laqueta Jean Wo Contrast 06/22/2013     Acute multifocal nonhemorrhagic left PCA territory infarction affecting the left posterior temporal lobe and occipital lobe. The observed pattern of T2 hyperintensity and restricted diffusion would be consistent with an insult beginning 4 days earlier.  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 MRA intracranial recommended for further evaluation.  Prominent perivascular spaces in association with slight atrophy and white matter signal abnormality, likely sequelae of hypertensive cerebral vascular disease. Also small remote left parietal cortical infarct.    CT of the brain    CT Angio Neck No stenoses or wall irregularities in the neck.  CT Angio Brain No major vessel occlusion or correctable proximal stenosis. Specifically, the left PCA is patent at this time. These findings suggest that the infarctions are embolic. 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  MRA of the brain   No major vessel occlusion or correctable proximal stenosis. 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  2D Echocardiogram  EF 55%, wall motion normal, LA upper normal size. No ASD or PFO identified.  Carotid Doppler   Carotid Duplex (Doppler) has been completed. Preliminary findings: Bilateral: 1-39% ICA stenosis. Vertebral artery flow is antegrade.   TEE--  CXR    EKG NSR  Therapy Recommendations OUTPATIENT  Physical Exam  Alert, oriented, thought content appropriate. Speech fluent without evidence of aphasia. Able to follow 3 step commands without difficulty.  Cranial Nerves:  II: Discs flat bilaterally; Visual fields partial right upper quadrantic defect, pupils equal, round, reactive to light and accommodation  III,IV, VI: ptosis not present, extra-ocular motions intact bilaterally   V,VII: smile symmetric, facial light touch sensation normal bilaterally  VIII: hearing normal bilaterally  IX,X: gag reflex present  XI: bilateral shoulder shrug  XII: midline tongue extension without atrophy or fasciculations  Motor:  Right : Upper extremity 5/5 Left: Upper extremity 5/5  Lower extremity 5/5 Lower extremity 5/5  Tone and bulk:normal tone throughout; no atrophy noted  Sensory: Pinprick and light touch intact throughout, bilaterally  Deep Tendon Reflexes:  Right: Upper Extremity Left: Upper extremity  biceps (C-5 to C-6) 2/4 biceps (C-5 to C-6) 2/4  tricep (C7) 2/4 triceps (C7) 2/4  Brachioradialis (C6) 2/4 Brachioradialis (C6) 2/4  Lower Extremity Lower Extremity  quadriceps (L-2 to L-4) 2/4 quadriceps (L-2 to L-4) 2/4  Achilles (S1) 2/4 Achilles (S1) 2/4  Plantars:  Right: downgoing Left: downgoing  Cerebellar:  normal finger-to-nose, normal heel-to-shin test  Gait:  No ataxia.  CV: pulses  palpable throughout    ASSESSMENT Daniel Gonzalez is a 57 y.o. male presenting with headache, visual field changes. Imaging confirms a acute multifocal nonhemorrhagic left PCA territory infarction affecting the left posterior temporal lobe and occipital lobe. Infarct felt to be embolic secondary to unknown source.  On no antithrombotics prior to admission. Now on aspirin 325 mg orally every day for secondary stroke prevention. Patient with resultant field cut. Work up underway.   LDL 97, at goal, no statin indicated.  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 Will follow up in outpatient setting.    Hospital day # 2  TREATMENT/PLAN  Continue aspirin 325 mg orally every day for secondary stroke prevention.  Risk factor modification  Vasculitic/hypercoag lab tests pending. However HIV + TEE to look for embolic source today.  If positive for PFO (patent foramen ovale),  check bilateral lower extremity venous dopplers to rule out DVT as possible source of stroke.  Outpatient OT evaluation recommended and do not drive until cleared due to vision changes. Have patient follow up with Dr. Pearlean Brownie in 2 months in stroke clinic.  Gwendolyn Lima. Manson Passey, [facility], MBA, MHA [facility] Pager: [phone] 06/24/2013 9:28 AM  I have personally obtained a history, examined the patient, evaluated imaging results, and formulated the assessment and plan of care. I agree with the above. Delia Heady. MD

## 2013-06-24 NOTE — Interval H&P Note (Signed)
History and Physical Interval Note:  06/24/2013 2:39 PM  Daniel Gonzalez  has presented today for surgery, with the diagnosis of stroke   The various methods of treatment have been discussed with the patient and family. After consideration of risks, benefits and other options for treatment, the patient has consented to  Procedure(s): TRANSESOPHAGEAL ECHOCARDIOGRAM (TEE) (N/A) as a surgical intervention .  The patient's history has been reviewed, patient examined, no change in status, stable for surgery.  I have reviewed the patient's chart and labs.  Questions were answered to the patient's satisfaction.     [name]

## 2013-06-24 NOTE — Progress Notes (Signed)
Pt sent down to Endo for TEE CMT made aware

## 2013-06-24 NOTE — H&P (View-Only) (Signed)
[facility]                                                                                Patient Demographics  Daniel Gonzalez, is a 57 y.o. male, DOB - 04/11/1956, MRN:[id number]  Admit date - 06/22/2013   Admitting Physician Ulugbek N Buriev, MD  Outpatient Primary MD for the patient is Katherine Tabori, MD  LOS - 2   Chief Complaint  Patient presents with  . Visual Field Change        Assessment & Plan    Principal Problem:   Acute CVA (cerebrovascular accident) Active Problems:   HIV DISEASE   HTN (hypertension)  Visual Distrubance/CVA -Neurology consulted, pending stroke team to see patient -Will continue Plavix, as patient has an allergy to aspirin. 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  HTN, uncontrolled -Continue bystolic, valsartan/titrate and prn hydralazine  HIV -Continue  cont home regimen; last CD 4-->860 (04/2013)  Code Status: Full  Family Communication: Sisters at bedside.  Disposition Plan: Admitted.   Procedures  2D Echocardiogram Study Conclusions - Left ventricle: Systolic function was normal. The estimated ejection fraction was in the range of 50% to 55%. Wall motion was normal; there were no regional wall motion abnormalities. Left ventricular diastolic function parameters were normal. - Atrial septum: No defect or patent foramen ovale was identified.  Consults   Neurology  DVT Prophylaxis  Lovenox   Lab Results  Component Value Date   PLT 216 06/22/2013    Medications  Scheduled Meds: . emtricitabine-tenofovir  1 tablet Oral Q breakfast   And  . rilpivirine  25 mg Oral Q breakfast  . enoxaparin (LOVENOX) injection  40 mg Subcutaneous Q24H  . irbesartan  300 mg Oral Daily  . nebivolol  10 mg Oral Daily  . zidovudine  300 mg Oral BID    Continuous Infusions: . sodium chloride 50 mL/hr at 06/22/13 2330  . sodium chloride     PRN Meds:.acetaminophen, hydrALAZINE, naphazoline-pheniramine, Polyethyl Glycol-Propyl Glycol  Antibiotics    Anti-infectives   Start     Dose/Rate Route Frequency Ordered Stop   06/23/13 0800  emtricitabine-tenofovir (TRUVADA) 200-300 MG per tablet 1 tablet     1 tablet Oral Daily with breakfast 06/23/13 0142     06/23/13 0800  rilpivirine (EDURANT) tablet 25 mg     25 mg Oral Daily with breakfast 06/23/13 0142     06/22/13 2200  zidovudine (RETROVIR) capsule 300 mg     300 mg Oral 2 times daily 06/22/13 1940     06/22/13 1945  Emtricitab-Rilpivir-Tenofovir 200-25-300 MG TABS  Status:  Discontinued      Oral Daily 06/22/13 1940 06/23/13 0142       Time Spent in minutes   20 minutes   Rana Adorno D.O. on 06/24/2013 at 12:11 PM  Between 7am to 7pm - Pager - [phone]  After 7pm go to [email or web address] - password [email or web address]  And look for the night coverage person covering for me after hours  [facility]   Office  [phone]    Subjective:   Daniel Gonzalez seen and examined today.  Patient continues to complain of headache.  He currently complains of "swirls" in his vision.  Patient denies dizziness, chest pain, shortness of breath, abdominal pain, N/V/D/C, new weakness, numbess, tingling.    Objective:   Filed Vitals:   06/23/13 1729 06/23/13 2216 06/24/13 0700 06/24/13 0948  BP: 173/93 165/91 164/98 158/95  Pulse: 55 56 51   Temp: 98 F (36.7 C) 97.9 F (36.6 C) 97.7 F (36.5 C) 97.9 F (36.6 C)  TempSrc: Oral Oral Oral Oral  Resp: 20 18 18 17  Height:      Weight:      SpO2: 100% 100% 97% 99%    Wt Readings from Last 3 Encounters:  06/22/13 63.957 kg (141 lb)  06/22/13 63.957 kg (141 lb)  06/22/13 63.617 kg (140 lb 4 oz)     Intake/Output Summary (Last 24 hours) at 06/24/13 1211 Last data filed at 06/23/13 1225  Gross per 24 hour  Intake      0 ml   Output    300 ml  Net   -300 ml    Exam  General: Well developed, well nourished, NAD, appears stated age  HEENT: NCAT, mucous membranes moist.  Neck: Supple, no JVD, no masses  Cardiovascular: S1 S2 auscultated, no rubs, murmurs or gallops. Regular rate and rhythm.  Respiratory: Clear to auscultation bilaterally with equal chest rise  Abdomen: Soft, nontender, nondistended, + bowel sounds  Extremities: warm dry without cyanosis clubbing or edema  Neuro: AAOx3, Strength 5/5 in patient's upper and lower extremities bilaterally.  Right peripheral visual field deficits.    Skin: Without rashes exudates or nodules  Psych: Normal affect and demeanor with intact judgement and insight  Data Review   Micro Results No results found for this or any previous visit (from the past 240 hour(s)).  Radiology Reports Dg Chest 2 View  06/22/2013   CLINICAL DATA:  57-year- male with stroke. Initial encounter.  EXAM: CHEST  2 VIEW  COMPARISON:  06/18/2010.  FINDINGS: Mildly increased cardiac silhouette on the PA view, borderline cardiomegaly on the lateral view. Other mediastinal contours are within normal limits. Visualized tracheal air column is within normal limits. Stable apical pleural/parenchymal scarring. No pneumothorax, pulmonary edema, pleural effusion or consolidation. No acute osseous abnormality identified.  IMPRESSION: Mild cardiomegaly has developed since 2011. No acute cardiopulmonary abnormality.   Electronically Signed   By: Lee  Hall M.D.   On: 06/22/2013 21:56   Mr Brain W Wo Contrast  06/22/2013   CLINICAL DATA:  Visual difficulty with headache and balance disturbance for 4 days.  EXAM: MRI HEAD WITHOUT AND WITH CONTRAST  TECHNIQUE: Multiplanar, multiecho pulse sequences of the brain and surrounding structures were obtained according to standard protocol without and with intravenous contrast  CONTRAST:  MultiHance 13 mL.  COMPARISON:  CT head 01/08/2006  FINDINGS: Small foci of  acute infarction are observed in the left PCA territory affecting the left medial posterior temporal lobe and occipital lobe. Subcentimeter focus of restricted diffusion also involves the left paramedian splenium.  No visible acute hemorrhage, mass lesion, hydrocephalus, or extra-axial fluid. Mild premature cerebral and cerebellar atrophy for the patient's age of 57. Prominent perivascular spaces suggesting longstanding hypertension. No foci of chronic hemorrhage. Increased T2 and FLAIR white matter hyperintensities, also premature for age, likely hypertensive related chronic microvascular ischemic change.  Remote cortical infarct affects the left posterior parietal lobe (image 16   series 6).  Normal pituitary, with mild cerebellar tonsillar ectopia. No osseous findings. Negative orbits. Mild chronic sinus disease. Mastoids unremarkable.  Flow voids are maintained in the internal carotid arteries and basilar artery. Both vertebrals appear to contribute to formation of the basilar. 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 Possible similar aneurysm 4 mm in size projecting laterally in the right cavernous sinus, versus tortuosity. Recommend MRA of the intracranial circulation for further evaluation.  Compared with prior CT, no abnormality was detected.  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 The observed pattern of T2 hyperintensity and restricted diffusion would be consistent with an insult beginning 4 days earlier.  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 MRA intracranial recommended for further evaluation.  Prominent perivascular spaces in association with slight  atrophy and white matter signal abnormality, likely sequelae of hypertensive cerebral vascular disease. Also small remote left parietal cortical infarct.  These results will be called to the ordering clinician or representative by the Radiologist Assistant, and communication documented in the PACS Dashboard.   Electronically Signed   By: John  Curnes M.D.   On: 06/22/2013 15:14    CBC  Recent Labs Lab 06/22/13 1654  WBC 4.9  HGB 13.6  HCT 37.5*  PLT 216  MCV 105.9*  MCH 38.4*  MCHC 36.3*  RDW 13.9  LYMPHSABS 3.3  MONOABS 0.4  EOSABS 0.1  BASOSABS 0.0    Chemistries   Recent Labs Lab 06/22/13 1258 06/22/13 1654  NA  --  137  K  --  4.0  CL  --  105  CO2  --  24  GLUCOSE  --  85  BUN  --  11  CREATININE 1.11 1.00  CALCIUM  --  9.0   ------------------------------------------------------------------------------------------------------------------ estimated creatinine clearance is 69.9 ml/min (by C-G formula based on Cr of 1). ------------------------------------------------------------------------------------------------------------------  Recent Labs  06/22/13 1654 06/23/13 0520  HGBA1C 5.8* 5.7*   ------------------------------------------------------------------------------------------------------------------  Recent Labs  06/23/13 0520  CHOL 150  HDL 36*  LDLCALC 97  TRIG 84  CHOLHDL 4.2   ------------------------------------------------------------------------------------------------------------------ No results found for this basename: TSH, T4TOTAL, FREET3, T3FREE, THYROIDAB,  in the last 72 hours ------------------------------------------------------------------------------------------------------------------ No results found for this basename: VITAMINB12, FOLATE, FERRITIN, TIBC, IRON, RETICCTPCT,  in the last 72 hours  Coagulation profile  Recent Labs Lab 06/22/13 1654  INR 1.00    No results found for this basename: DDIMER,  in the last 72  hours  Cardiac Enzymes No results found for this basename: CK, CKMB, TROPONINI, MYOGLOBIN,  in the last 168 hours ------------------------------------------------------------------------------------------------------------------ No components found with this basename: POCBNP,      

## 2013-06-24 NOTE — Progress Notes (Signed)
  Echocardiogram Echocardiogram Transesophageal has been performed.  Daniel Gonzalez 06/24/2013, 3:22 PM

## 2013-06-25 LAB — COMPLEMENT, TOTAL: Compl, Total (CH50): 56 U/mL (ref 31–60)

## 2013-06-25 MED ORDER — CLOPIDOGREL BISULFATE 75 MG PO TABS: 75.0000 mg | ORAL_TABLET | Freq: Every day | ORAL | Status: DC

## 2013-06-25 MED ORDER — ASPIRIN EC 81 MG PO TBEC: 81.0000 mg | DELAYED_RELEASE_TABLET | Freq: Every day | ORAL | Status: DC

## 2013-06-25 NOTE — Discharge Summary (Signed)
Physician Discharge Summary  Daniel Gonzalez ZOX:[id number] DOB: [birth year] DOA: 06/22/2013  PCP: Daniel Rhymes, MD  Admit date: 06/22/2013 Discharge date: 06/25/2013  Time spent: 45 minutes  Recommendations for Outpatient Follow-up:  Patient will be discharged to home. He should follow up with his primary care physician and ophthalmologist within one week of discharge. Patient to continue taking his medications as prescribed. He'll also to followup with Daniel Gonzalez within 2 months. Patient will be discharged with home health occupational therapy. He should not drive until cleared by his ophthalmologist or primary care physician.  Discharge Diagnoses:  Principal Problem:   Acute CVA (cerebrovascular accident) Active Problems:   HIV DISEASE   HTN (hypertension)  Discharge Condition: Stable  Diet recommendation: Heart healthy  Filed Weights   06/22/13 1655  Weight: 63.957 kg (141 lb)    History of present illness:  Daniel Gonzalez is a 57 y.o. male with PMH of HTN, HIV presented to PCP office today with visual change who recommended to obtain MRI found to have CVA; patient reports having R vision changes since Friday associated with headaches but without other neurological findings, denies focal weakness, no paraesthesia, no ataxia, no fever, no chest pain, no SOB;   Hospital Course:  This 57 year old male with history of HIV currently on medication, hypertension that presents emergency department for visual changes. Patient states that he had gone to his primary care physician's office for his high blood pressure at which point was not improving. He is also having visual changes and decided to come to emergency department. Patient was found to have CVA on MRI, particularly an acute multifocal nonhemorrhagic left PCA territory infarction affecting the left posterior temporal and occipital lobes. 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 Neurology was consulted. Patient did undergo a 2-D echocardiogram, results are listed below. He also underwent a TEE, those results are listed below as well.  His carotid Dopplers also conducted showing bilateral 1-39% ICA stenosis with vertebral artery flow antegrade.  Patient was also on full dose aspirin as he does have an aspirin allergy which causes her to spit up. He will be discharged with Plavix. Patient is to follow up with his primary care physician as well as ophthalmologist within one week of discharge. He should also follow up with Daniel Gonzalez within 2 months. This was discussed with the patient and he does agree. Patient should continue his medications for his HIV as well as hypertension.  Patient was seen and examined on day of discharge. He is stable for discharge. This all was discussed with the patient and he does agree. Patient should not drive until cleared by his ophthalmologist and primary care physician.  Procedures: 2D Echocardiogram  Study Conclusions - Left ventricle: Systolic function was normal. The estimated ejection fraction was in the range of 50% to 55%. Wall motion was normal; there were no regional wall motion abnormalities. Left ventricular diastolic function parameters were normal. - Atrial septum: No defect or patent foramen ovale was Identified.  TEE Study Conclusions - Left ventricle: The cavity size was normal. Wall thickness was normal. Systolic function was normal. The estimated ejection fraction was 55%. Wall motion was normal; there were no regional wall motion abnormalities. - Aortic valve: No evidence of vegetation. - Mitral valve: No evidence of vegetation. - Right atrium: No evidence of thrombus in the atrial cavity or appendage. - Atrial septum: No defect or patent  foramen ovale was identified. - Tricuspid valve: No evidence of vegetation. - Pulmonic valve: No evidence of vegetation.  Consultations: Neurology Cardiology  for TEE  Discharge Exam: Filed Vitals:   06/25/13 0535  BP: 146/84  Pulse: 55  Temp: 97.8 F (36.6 C)  Resp: 20    Exam  General: Well developed, well nourished, NAD, appears stated age  HEENT: NCAT, mucous membranes moist.  Neck: Supple, no JVD, no masses  Cardiovascular: S1 S2 auscultated, no rubs, murmurs or gallops. Regular rate and rhythm.  Respiratory: Clear to auscultation bilaterally with equal chest rise  Abdomen: Soft, nontender, nondistended, + bowel sounds  Extremities: warm dry without cyanosis clubbing or edema  Neuro: AAOx3, Strength 5/5 in patient's upper and lower extremities bilaterally. Right peripheral visual field deficits.  Skin: Without rashes exudates or nodules  Psych: Normal affect and demeanor with intact judgement and insight  Discharge Instructions  Discharge Orders   Future Orders Complete By Expires   Diet - low sodium heart healthy  As directed    Discharge instructions  As directed    Comments:     Patient will be discharged to home. He should follow up with his primary care physician and ophthalmologist within one week of discharge.  Patient to continue taking his medications as prescribed. He'll also to followup with Daniel Gonzalez within 2 months. Patient will be discharged with home health occupational therapy. He should not drive until cleared by his ophthalmologist or primary care physician.   Increase activity slowly  As directed        Medication List    STOP taking these medications       naproxen sodium 220 MG tablet  Commonly known as:  ANAPROX     sildenafil 100 MG tablet  Commonly known as:  VIAGRA      TAKE these medications       acetaminophen 500 MG tablet  Commonly known as:  TYLENOL  Take 1,000 mg by mouth every 6 (six) hours as needed.     clopidogrel 75 MG tablet  Commonly known as:  PLAVIX  Take 1 tablet (75 mg total) by mouth daily with breakfast.  Start taking on:  06/26/2013     COMPLERA 200-25-300 MG Tabs   Generic drug:  Emtricitab-Rilpivir-Tenofovir  Take 1 tablet by mouth once a day . Must be taken with  a medium-sized meal.     fluticasone 50 MCG/ACT nasal spray  Commonly known as:  FLONASE  Place 2 sprays into the nose as needed for allergies.     naphazoline-pheniramine 0.025-0.3 % ophthalmic solution  Commonly known as:  NAPHCON-A  Place 1 drop into both eyes daily as needed for irritation.     nebivolol 10 MG tablet  Commonly known as:  BYSTOLIC  Take 1 tablet (10 mg total) by mouth daily.     SYSTANE 0.4-0.3 % Soln  Generic drug:  Polyethyl Glycol-Propyl Glycol  Apply 1 drop to eye daily as needed (for dry eyes).     valsartan 320 MG tablet  Commonly known as:  DIOVAN  Take 320 mg by mouth daily.     zidovudine 300 MG tablet  Commonly known as:  RETROVIR  Take 1 tablet (300 mg total) by mouth 2 (two) times daily.       Allergies  Allergen Reactions  . Asa [Aspirin] Palpitations    Speeds up heart rate  . Sulfamethoxazole-Tmp Ds Itching       Follow-up Information   Follow  up with Daniel Rhymes, MD. Schedule an appointment as soon as possible for a visit in 1 week.   Specialty:  Family Medicine   Contact information:   [phone] [place] [address] [phone]       Follow up with Gates Rigg, MD. Schedule an appointment as soon as possible for a visit in 2 months.   Specialties:  Neurology, Radiology   Contact information:   [address] [place] [address] [phone]       Follow up with Ophthalmologist. Schedule an appointment as soon as possible for a visit in 1 week.       The results of significant diagnostics from this hospitalization (including imaging, microbiology, ancillary and laboratory) are listed below for reference.    Significant Diagnostic Studies: Ct Angio Head W/cm &/or Wo Cm  06/23/2013   CLINICAL DATA:  Left PCA territory stroke. Possible carotid aneurysms by MRI.  EXAM: CT ANGIOGRAPHY  HEAD AND NECK  TECHNIQUE: Multidetector CT imaging of the head and neck was performed using the standard protocol during bolus administration of intravenous contrast. Multiplanar CT image reconstructions including MIPs were obtained to evaluate the vascular anatomy. Carotid stenosis measurements (when applicable) are obtained utilizing NASCET criteria, using the distal internal carotid diameter as the denominator.  CONTRAST:  50mL OMNIPAQUE IOHEXOL 350 MG/ML SOLN  COMPARISON:  MRI 06/22/2013.  FINDINGS: CTA HEAD FINDINGS  The right common carotid artery is widely patent through the skullbase and siphon region. No carotid aneurysm on the right. The anterior and middle cerebral vessels are patent without proximal stenosis, aneurysm or vascular malformation.  The left internal carotid artery is widely patent through the skullbase. 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 The left anterior and middle cerebral vessels appear widely patent and normal.  Both vertebral arteries are widely patent through the foramen magnum to the basilar. Both posterior inferior cerebellar arteries are normal. The basilar artery is smooth and of normal caliber. Both superior cerebellar arteries and posterior cerebral arteries appear widely patent and normal.  Venous structures appear normal.  Parenchymal imaging does not show any abnormality. Small infarctions seen in the left PCA territory by MRI are not discernible.  Review of the MIP images confirms the above findings.  CTA NECK FINDINGS  Lung apices show emphysema. No evidence of mass lesion. No superior mediastinal lesion.  Branching pattern of the brachiocephalic vessels from the arch is normal. No origin stenoses.  Right common carotid artery is widely patent to the bifurcation. The carotid bifurcation is widely patent without atherosclerotic plaque. No narrowing or irregularity of the right ICA.  The  left common carotid artery is widely patent to the bifurcation. The bifurcation is widely patent without plaque or irregularity. The cervical ICA is normal.  Both vertebral artery origins are widely patent. Both vertebral arteries are widely patent through the neck.  No soft tissue lesion of the neck is identified.  Review of the MIP images confirms the above findings.  IMPRESSION: CTA neck:  No stenoses or wall irregularities in the neck.  CTA head: No major vessel occlusion or correctable proximal stenosis. Specifically, the left PCA is patent at this time. These findings suggest that the infarctions are embolic.  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   Electronically Signed   By: Scherrie Bateman.D.  On: 06/23/2013 16:04   Dg Chest 2 View  06/22/2013   CLINICAL DATA:  57-year- male with stroke. Initial encounter.  EXAM: CHEST  2 VIEW  COMPARISON:  06/18/2010.  FINDINGS: Mildly increased cardiac silhouette on the PA view, borderline cardiomegaly on the lateral view. Other mediastinal contours are within normal limits. Visualized tracheal air column is within normal limits. Stable apical pleural/parenchymal scarring. No pneumothorax, pulmonary edema, pleural effusion or consolidation. No acute osseous abnormality identified.  IMPRESSION: Mild cardiomegaly has developed since 2011. No acute cardiopulmonary abnormality.   Electronically Signed   By: Augusto Gamble M.D.   On: 06/22/2013 21:56   Ct Angio Neck W/cm &/or Wo/cm  06/23/2013   CLINICAL DATA:  Left PCA territory stroke. Possible carotid aneurysms by MRI.  EXAM: CT ANGIOGRAPHY HEAD AND NECK  TECHNIQUE: Multidetector CT imaging of the head and neck was performed using the standard protocol during bolus administration of intravenous contrast. Multiplanar CT image reconstructions including MIPs were obtained to evaluate the vascular anatomy. Carotid stenosis measurements (when applicable) are  obtained utilizing NASCET criteria, using the distal internal carotid diameter as the denominator.  CONTRAST:  50mL OMNIPAQUE IOHEXOL 350 MG/ML SOLN  COMPARISON:  MRI 06/22/2013.  FINDINGS: CTA HEAD FINDINGS  The right common carotid artery is widely patent through the skullbase and siphon region. No carotid aneurysm on the right. The anterior and middle cerebral vessels are patent without proximal stenosis, aneurysm or vascular malformation.  The left internal carotid artery is widely patent through the skullbase. 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 The left anterior and middle cerebral vessels appear widely patent and normal.  Both vertebral arteries are widely patent through the foramen magnum to the basilar. Both posterior inferior cerebellar arteries are normal. The basilar artery is smooth and of normal caliber. Both superior cerebellar arteries and posterior cerebral arteries appear widely patent and normal.  Venous structures appear normal.  Parenchymal imaging does not show any abnormality. Small infarctions seen in the left PCA territory by MRI are not discernible.  Review of the MIP images confirms the above findings.  CTA NECK FINDINGS  Lung apices show emphysema. No evidence of mass lesion. No superior mediastinal lesion.  Branching pattern of the brachiocephalic vessels from the arch is normal. No origin stenoses.  Right common carotid artery is widely patent to the bifurcation. The carotid bifurcation is widely patent without atherosclerotic plaque. No narrowing or irregularity of the right ICA.  The left common carotid artery is widely patent to the bifurcation. The bifurcation is widely patent without plaque or irregularity. The cervical ICA is normal.  Both vertebral artery origins are widely patent. Both vertebral arteries are widely patent through the neck.  No soft tissue lesion of the neck is identified.   Review of the MIP images confirms the above findings.  IMPRESSION: CTA neck:  No stenoses or wall irregularities in the neck.  CTA head: No major vessel occlusion or correctable proximal stenosis. Specifically, the left PCA is patent at this time. These findings suggest that the infarctions are embolic.  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   Electronically Signed   By: Paulina Fusi M.D.   On: 06/23/2013 16:04   Mr Laqueta Jean ZO Contrast  06/22/2013   CLINICAL DATA:  Visual difficulty with headache and balance disturbance for 4 days.  EXAM: MRI HEAD WITHOUT AND WITH CONTRAST  TECHNIQUE: Multiplanar, multiecho pulse sequences of the brain and surrounding structures were obtained according to standard protocol without and with intravenous contrast  CONTRAST:  MultiHance 13 mL.  COMPARISON:  CT head 01/08/2006  FINDINGS: Small foci of acute infarction are observed in the left PCA territory affecting the left medial posterior temporal lobe and occipital lobe. Subcentimeter focus of restricted diffusion also involves the left paramedian splenium.  No visible acute hemorrhage, mass lesion, hydrocephalus, or extra-axial fluid. Mild premature cerebral and cerebellar atrophy for the patient's age of 56. Prominent perivascular spaces suggesting longstanding hypertension. No foci of chronic hemorrhage. Increased T2 and FLAIR white matter hyperintensities, also premature for age, likely hypertensive related chronic microvascular ischemic change.  Remote cortical infarct affects the left posterior parietal lobe (image 16 series 6).  Normal pituitary, with mild cerebellar tonsillar ectopia. No osseous findings. Negative orbits. Mild chronic sinus disease. Mastoids unremarkable.  Flow voids are maintained in the internal carotid arteries and basilar artery. Both vertebrals appear to contribute to formation of the basilar. 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 Possible similar aneurysm 4 mm in size projecting laterally in the right cavernous sinus, versus tortuosity. Recommend MRA of the intracranial circulation for further evaluation.  Compared with prior CT, no abnormality was detected.  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 The observed pattern of T2 hyperintensity and restricted diffusion would be consistent with an insult beginning 4 days earlier.  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 MRA intracranial recommended for further evaluation.  Prominent perivascular spaces in association with slight atrophy and white matter signal abnormality, likely sequelae of hypertensive cerebral vascular disease. Also small remote left parietal cortical infarct.  These results will be called to the ordering clinician or representative by the Radiologist Assistant, and communication documented in the PACS Dashboard.   Electronically Signed   By: Davonna Belling M.D.   On: 06/22/2013 15:14   Mr Maxine Glenn Head/brain Wo Cm  06/23/2013   CLINICAL DATA:  Left PCA territory infarction. Left carotid aneurysm.  EXAM: MRA HEAD WITHOUT CONTRAST  TECHNIQUE: MRA HEAD WITHOUT CONTRAST  COMPARISON:  CT angiography same day. MRI brain 06/22/2013.  FINDINGS: Both internal carotid arteries are widely patent into the brain. No aneurysm on the right. The right anterior and middle cerebral vessels appear normal. On the left, there is a 10 x 7 x 6 mm aneurysm projecting posteriorly from the anterior aspect of the carotid siphon. Beyond that, the vessel is widely patent and normal with no stenosis of the anterior or middle cerebral vessels.  Both vertebral  arteries are widely patent to the basilar. No basilar stenosis. Posterior circulation branch vessels are patent without proximal stenosis, aneurysm or vascular malformation.  IMPRESSION: No major vessel occlusion or correctable proximal stenosis.  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   Electronically Signed   By: Paulina Fusi M.D.   On: 06/23/2013 17:27    Microbiology: No results found for this or any previous visit (from the past 240 hour(s)).   Labs: Basic Metabolic Panel:  Recent Labs Lab 06/22/13 1258 06/22/13 1654  NA  --  137  K  --  4.0  CL  --  105  CO2  --  24  GLUCOSE  --  85  BUN  --  11  CREATININE 1.11 1.00  CALCIUM  --  9.0   Liver Function Tests: No results found for this basename: AST, ALT, ALKPHOS, BILITOT, PROT, ALBUMIN,  in the last 168 hours No results found for this basename: LIPASE, AMYLASE,  in the last 168 hours No results found for this basename: AMMONIA,  in the last 168 hours CBC:  Recent Labs Lab 06/22/13 1654  WBC 4.9  NEUTROABS 1.1*  HGB 13.6  HCT 37.5*  MCV 105.9*  PLT 216   Cardiac Enzymes: No results found for this basename: CKTOTAL, CKMB, CKMBINDEX, TROPONINI,  in the last 168 hours BNP: BNP (last 3 results) No results found for this basename: PROBNP,  in the last 8760 hours CBG: No results found for this basename: GLUCAP,  in the last 168 hours     Signed:  Edsel Petrin  [facility] 06/25/2013, 9:29 AM

## 2013-06-25 NOTE — Progress Notes (Signed)
Stroke Team Progress Note  HISTORY Daniel Gonzalez is a 57 y.o. male with a past medical history significant for HTN, HIV infection, cluster headache, migraine, heavy smoking, who was in his usual state of health until 5 days prior to admission when he started having " visual changes like a colorful circle that comes and goes". He never had similar symptoms before but reported that the previous Friday he woke up with a HA and then started experiencing intermittent visual changes that he described as " a colorful circle, like the ones they use in the Christmas tree" that is off and on, every 5 minutes. He said that he was not having visual loss or double vision but it was almost impossible to drive.   Denied associated vertigo, focal weakness or numbness, difficulty swallowing, slurred speech, unsteadiness, confusion, chest pain, SOB, or palpitations.  A brain MRI was ordered as outpatient and the day of admission he got a phone call advising him to come to the hospital for further evaluation of an abnormal MRI.  Dr. 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   Patient was not a TPA candidate secondary to symptoms outside of tPA window. He was admitted for further evaluation and treatment.   SUBJECTIVE The patient reports his eyesight is a little better today. 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 He will followup in the office with Dr. Pearlean Brownie.   OBJECTIVE Most recent Vital Signs: Filed Vitals:   06/24/13 2200 06/24/13 2330 06/25/13 0120 06/25/13 0535  BP: 174/97 147/96 148/87 146/84  Pulse: 50 50 55 55  Temp: 97.9 F (36.6 C) 98.2 F (36.8 C) 98.3 F (36.8 C) 97.8 F (36.6 C)  TempSrc: Oral Oral Oral Oral  Resp: 18 18 18 20   Height:      Weight:      SpO2: 96% 97% 100% 98%   CBG (last 3)  No results found for this basename: GLUCAP,  in the  last 72 hours  IV Fluid Intake:   . sodium chloride 50 mL/hr at 06/22/13 2330    MEDICATIONS  . [START ON 06/26/2013] clopidogrel  75 mg Oral Q breakfast  . emtricitabine-tenofovir  1 tablet Oral Q breakfast   And  . rilpivirine  25 mg Oral Q breakfast  . enoxaparin (LOVENOX) injection  40 mg Subcutaneous Q24H  . irbesartan  300 mg Oral Daily  . nebivolol  10 mg Oral Daily  . zidovudine  300 mg Oral BID   PRN:  acetaminophen, hydrALAZINE, naphazoline-pheniramine, ondansetron, Polyethyl Glycol-Propyl Glycol  Diet:  Cardiac thin liquids Activity:  Bedrest  DVT Prophylaxis:  lovenox  CLINICALLY SIGNIFICANT STUDIES Basic Metabolic Panel:   Recent Labs Lab 06/22/13 1258 06/22/13 1654  NA  --  137  K  --  4.0  CL  --  105  CO2  --  24  GLUCOSE  --  85  BUN  --  11  CREATININE 1.11 1.00  CALCIUM  --  9.0   Liver Function Tests: No results found for this basename: AST, ALT, ALKPHOS, BILITOT, PROT, ALBUMIN,  in the last 168 hours CBC:   Recent Labs Lab 06/22/13 1654  WBC 4.9  NEUTROABS 1.1*  HGB 13.6  HCT 37.5*  MCV 105.9*  PLT 216   Coagulation:   Recent Labs Lab 06/22/13 1654  LABPROT 13.0  INR 1.00   Cardiac Enzymes: No results  found for this basename: CKTOTAL, CKMB, CKMBINDEX, TROPONINI,  in the last 168 hours Urinalysis: No results found for this basename: COLORURINE, APPERANCEUR, LABSPEC, PHURINE, GLUCOSEU, HGBUR, BILIRUBINUR, KETONESUR, PROTEINUR, UROBILINOGEN, NITRITE, LEUKOCYTESUR,  in the last 168 hours Lipid Panel    Component Value Date/Time   CHOL 150 06/23/2013 0520   TRIG 84 06/23/2013 0520   HDL 36* 06/23/2013 0520   CHOLHDL 4.2 06/23/2013 0520   VLDL 17 06/23/2013 0520   LDLCALC 97 06/23/2013 0520   HgbA1C  Lab Results  Component Value Date   HGBA1C 5.7* 06/23/2013    Urine Drug Screen:      Component Value Date/Time   LABOPIA NONE DETECTED 06/23/2013 1227    Alcohol Level: No results found for this basename: ETH,  in the last 168  hours  Dg Chest 2 View 06/22/2013 Mild cardiomegaly has developed since 2011. No acute cardiopulmonary abnormality.     Mr Laqueta Jean Wo Contrast 06/22/2013     Acute multifocal nonhemorrhagic left PCA territory infarction affecting the left posterior temporal lobe and occipital lobe. The observed pattern of T2 hyperintensity and restricted diffusion would be consistent with an insult beginning 4 days earlier.  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 MRA intracranial recommended for further evaluation.  Prominent perivascular spaces in association with slight atrophy and white matter signal abnormality, likely sequelae of hypertensive cerebral vascular disease. Also small remote left parietal cortical infarct.    CT of the brain    CT Angio Neck No stenoses or wall irregularities in the neck.  CT Angio Brain No major vessel occlusion or correctable proximal stenosis. Specifically, the left PCA is patent at this time. These findings suggest that the infarctions are embolic. 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  MRA of the brain   No major vessel occlusion or correctable proximal stenosis. 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  2D Echocardiogram  EF 55%, wall motion normal, LA upper normal size. No ASD or PFO identified.  Carotid Doppler   Carotid Duplex (Doppler) has been completed. Preliminary findings: Bilateral: 1-39% ICA stenosis. Vertebral artery flow is antegrade.   TEE-06/24/2013 - ejection fraction 55%. No defect or patent foramen ovale was identified. No evidence of vegetation. No evidence of thrombus in the atrial cavity.  EKG NSR  Therapy Recommendations OUTPATIENT  Physical Exam  Alert, oriented, thought content appropriate. Speech fluent  without evidence of aphasia. Able to follow 3 step commands without difficulty.  Cranial Nerves:  II: Discs flat bilaterally; Visual fields partial right upper quadrantic defect, pupils equal, round, reactive to light and accommodation  III,IV, VI: ptosis not present, extra-ocular motions intact bilaterally  V,VII: smile symmetric, facial light touch sensation normal bilaterally  VIII: hearing normal bilaterally  IX,X: gag reflex present  XI: bilateral shoulder shrug  XII: midline tongue extension without atrophy or fasciculations  Motor:  Right : Upper extremity 5/5 Left: Upper extremity 5/5  Lower extremity 5/5 Lower extremity 5/5  Tone and bulk:normal tone throughout; no atrophy noted  Sensory: Pinprick and light touch intact throughout, bilaterally  Deep Tendon Reflexes:  Right: Upper Extremity Left: Upper extremity  biceps (C-5 to C-6) 2/4 biceps (C-5 to C-6) 2/4  tricep (C7) 2/4 triceps (C7) 2/4  Brachioradialis (C6) 2/4 Brachioradialis (C6)  2/4  Lower Extremity Lower Extremity  quadriceps (L-2 to L-4) 2/4 quadriceps (L-2 to L-4) 2/4  Achilles (S1) 2/4 Achilles (S1) 2/4  Plantars:  Right: downgoing Left: downgoing  Cerebellar:  normal finger-to-nose, normal heel-to-shin test  Gait:  No ataxia.  CV: pulses palpable throughout    ASSESSMENT Daniel Gonzalez is a 57 y.o. male presenting with headache, visual field changes. 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 Infarct felt to be embolic secondary to unknown source.  On no antithrombotics prior to admission. Now on aspirin 325 mg orally every day for secondary stroke prevention. Patient with resultant field cut. Work up completed.   LDL 97, at goal, no statin indicated.  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 Will follow  up in outpatient setting.    Hospital day # 3  TREATMENT/PLAN  Continue aspirin 325 mg orally every day for secondary stroke prevention.  Risk factor modification  Vasculitic/hypercoag lab tests pending. However HIV + TEE performed 06/24/2013 revealed no PFO, vegetations, or thrombus. Outpatient OT evaluation recommended and not to drive until cleared due to vision changes. Have patient follow up with Dr. Pearlean Brownie in 2 months in stroke clinic.  Delton See PA-C [facility] Pager [phone] 06/25/2013, 9:27 AM  I evaluated and examined patient, reviewed records, labs and imaging, and agree with note and plan.  Suanne Marker, MD 06/25/2013, 5:00 PM Certified in Neurology, Neurophysiology and Neuroimaging [facility] - Stroke Team  Please refer to [email or web address] for on-call Stroke MD

## 2013-06-25 NOTE — Progress Notes (Signed)
Pt  To  be d/c home AVS  Stroke and follow up education given to pt. Pt awaiting for pick up   SL removed VS and condition stable

## 2013-06-25 NOTE — Progress Notes (Signed)
Without further complaint of N & V since previous note. At 0218 complained of HA to left side of head, reports HA's PTA, PRN tylenol given with relief. Alfredo Martinez A

## 2013-06-26 NOTE — Care Management Note (Signed)
    Page 1 of 1   06/26/2013     2:26:03 PM   CARE MANAGEMENT NOTE 06/26/2013  Patient:  OSA, CAMPOLI   Account Number:  [id number]  Date Initiated:  06/26/2013  Documentation initiated by:  [facility]  Subjective/Objective Assessment:   adm: Acute CVA (cerebrovascular accident)     Action/Plan:   HH   Anticipated DC Date:  06/25/2013   Anticipated DC Plan:  HOME W HOME HEALTH SERVICES      DC Planning Services  CM consult      [facility] Choice  HOME HEALTH   Choice offered to / List presented to:  C-1 Patient        HH arranged  HH-1 RN  HH-3 OT      [facility] agency  [facility]   Status of service:  Completed, signed off Medicare Important Message given?   (If response is "NO", the following Medicare IM given date fields will be blank) Date Medicare IM given:   Date Additional Medicare IM given:    Discharge Disposition:  HOME W HOME HEALTH SERVICES  Per UR Regulation:    If discussed at Long Length of Stay Meetings, dates discussed:    Comments:  06/26/13 14:20 CM spoke with MD to make aware that insurance will not cover with OT standing alone.  RN for eval was added per MD request.  CM called pt to offer choice and pt chose [facility] for HHRN/OT.  Referral faxed to [facility] and address and contact numbers were verified.  No other CM needs were communicated.  Freddy Jaksch, BSN, CM [phone].

## 2013-06-27 ENCOUNTER — Encounter (HOSPITAL_COMMUNITY): Payer: Self-pay | Admitting: Internal Medicine

## 2013-06-27 LAB — HIV 1/2 CONFIRMATION
HIV-1 antibody: POSITIVE
HIV-2 Ab: NEGATIVE

## 2013-06-28 LAB — CARDIOLIPIN ANTIBODIES, IGG, IGM, IGA
Anticardiolipin IgA: 7 APL U/mL — ABNORMAL LOW (ref <22)
Anticardiolipin IgM: 0 MPL U/mL — ABNORMAL LOW (ref <11)

## 2013-06-29 ENCOUNTER — Telehealth: Payer: Self-pay | Admitting: *Deleted

## 2013-06-29 NOTE — Telephone Encounter (Signed)
Toniann Fail [facility] RN called and is asking about referral to them for this pt  HHN and OT.  I called pt and he states has appt tomorrow with eye doc.  He states his eye site is back to baseline and stated to Toniann Fail that he will be going back to work on Monday.   ? Need for HH.  I called and spoke to pt.  He does not need HH, outpt to be ordered.  Pt to be called after consulting Dr. Pearlean Brownie.

## 2013-06-29 NOTE — Telephone Encounter (Signed)
I called and relayed to pt via VM that Dr. Pearlean Brownie was consulted and wanted him to have the eye exam notes and clearance from the opthamologist regarding driving.  Orders for out pt OT pending this.

## 2013-06-30 LAB — C2 COMPLEMENT: C2 Complement: 2.1 mg/dL (ref 1.6–3.5)

## 2013-07-01 ENCOUNTER — Ambulatory Visit (INDEPENDENT_AMBULATORY_CARE_PROVIDER_SITE_OTHER): Payer: 59 | Admitting: Family Medicine

## 2013-07-01 ENCOUNTER — Encounter: Payer: Self-pay | Admitting: Family Medicine

## 2013-07-01 VITALS — BP 140/90 | HR 66 | Temp 97.0°F | Ht 64.4 in | Wt 136.4 lb

## 2013-07-01 DIAGNOSIS — IMO0002 Reserved for concepts with insufficient information to code with codable children: Secondary | ICD-10-CM

## 2013-07-01 DIAGNOSIS — I10 Essential (primary) hypertension: Secondary | ICD-10-CM

## 2013-07-01 DIAGNOSIS — F528 Other sexual dysfunction not due to a substance or known physiological condition: Secondary | ICD-10-CM

## 2013-07-01 DIAGNOSIS — I6350 Cerebral infarction due to unspecified occlusion or stenosis of unspecified cerebral artery: Secondary | ICD-10-CM

## 2013-07-01 DIAGNOSIS — I639 Cerebral infarction, unspecified: Secondary | ICD-10-CM

## 2013-07-01 MED ORDER — AMLODIPINE BESYLATE 5 MG PO TABS: 5.0000 mg | ORAL_TABLET | Freq: Every day | ORAL | Status: DC

## 2013-07-01 NOTE — Telephone Encounter (Signed)
Received call from Dr. Beverely Low, pt being seen in her office today, and pt is asking about taking viagra.  He was on pre- hospitalization and then noted that this was not on his discharge.  Reason?  Ok to have sex after having stroke?  Will forward to Dr. Pearlean Brownie.  I will call pt with answer then f/u on eye exam.

## 2013-07-01 NOTE — Progress Notes (Signed)
  Subjective:    Patient ID: Daniel Gonzalez, male    DOB: [birth year], 57 y.o.   MRN: [id number]  HPI Pt was admitted on 11/5 having multifocal stroke w/ atypical presentation- had HA and visual changes but no weakness, numbness, or other neurologic symptoms.  Pt reports he was cleared to drive by ophthalmologist yesterday.  Has neuro f/u in 2 months.  Pt reports HA is much improved.  Currently on Plavix.  Continues to smoke.  Is taking Bystolic and Diovan, BP better but not at goal given recent CVA.  Pt upset that Viagra was taken off med list w/out discussion.     Review of Systems For ROS see HPI     Objective:   Physical Exam  Vitals reviewed. Constitutional: He is oriented to person, place, and time. He appears well-developed and well-nourished. No distress.  HENT:  Head: Normocephalic and atraumatic.  Eyes: Conjunctivae and EOM are normal. Pupils are equal, round, and reactive to light.  Neck: Normal range of motion. Neck supple. No thyromegaly present.  Cardiovascular: Normal rate, regular rhythm, normal heart sounds and intact distal pulses.   No murmur heard. Pulmonary/Chest: Effort normal and breath sounds normal. No respiratory distress.  Abdominal: Soft. Bowel sounds are normal. He exhibits no distension.  Musculoskeletal: He exhibits no edema.  Lymphadenopathy:    He has no cervical adenopathy.  Neurological: He is alert and oriented to person, place, and time. No cranial nerve deficit.  Skin: Skin is warm and dry.  Psychiatric: He has a normal mood and affect. His behavior is normal.          Assessment & Plan:

## 2013-07-01 NOTE — Progress Notes (Signed)
Pre visit review using our clinic review tool, if applicable. No additional management support is needed unless otherwise documented below in the visit note. 

## 2013-07-01 NOTE — Patient Instructions (Signed)
Follow up in 1 month to recheck BP Continue the Bystoli and Diovan ADD the Amlodipine daily for better BP control [facility] said they will call you about the Viagra Call with any questions or concerns SO GLAD YOU ARE FEELING BETTER!!!

## 2013-07-01 NOTE — Telephone Encounter (Signed)
I consulted Dr. Pearlean Brownie re: Dr. Rennis Golden note about sexual activity and taking viagra after having stroke.   He stated that pt may resume sexual relations if he is back to being physically fit,  viagra may be used if not abused.  I relayed to pt.  He states got Dr. Heather Burundi, opthamologist approval to drive.  I do not see report as yet.  Will contact there office.  He may need note for work and I told him to see and let us know.  Will need try and get records from Dr. Burundi. [phone].  Spoke to [place], they will fax to me at [phone].

## 2013-07-03 NOTE — Assessment & Plan Note (Signed)
Placed call to neuro to ask whether pt can continue to take Viagra as needed.  Pt reports he was not told to refrain from sexual activity.  Neuro to contact pt directly.

## 2013-07-03 NOTE — Assessment & Plan Note (Signed)
Chronic problem for pt.  Currently on Valsartan and Bystolic w/ fair but not good control- particularly in the setting of recent CVA.  Start Amlodipine.  Monitor for improvement.  Pt expressed understanding and is in agreement w/ plan.

## 2013-07-03 NOTE — Assessment & Plan Note (Signed)
New to provider.  Pt has been cleared to drive by ophthalmology.  Has neuro f/u scheduled.  On plavix.  Goal is risk reduction.  Will attempt to get better BP control w/ addition of Amlodipine.  Will follow.

## 2013-07-20 NOTE — Telephone Encounter (Signed)
I called again for eye exam on pt.

## 2013-07-27 ENCOUNTER — Other Ambulatory Visit (HOSPITAL_COMMUNITY): Payer: Self-pay | Admitting: Family Medicine

## 2013-07-28 NOTE — Telephone Encounter (Signed)
Patient called and stated that he has requested a refill for Plavix with Edsel Petrin, DO. Patient states that he was told to contact his primary care provider to get this refill.   Patient also is complaining of itching. Patient states that he believes that it is coming from one of his medication that he is taking. Patient couldn't tell me which medication he believes it is coming from. Patient states that he doesn't believe that it is his diovan because he has been taking that for months. He states that he just itch after taking medications. Please advise. SW

## 2013-07-29 ENCOUNTER — Telehealth: Payer: Self-pay | Admitting: *Deleted

## 2013-07-29 NOTE — Telephone Encounter (Signed)
Patient made aware.

## 2013-07-29 NOTE — Telephone Encounter (Signed)
Patient also is complaining of itching. Patient states that he believes that it is coming from one of his medication that he is taking. Patient couldn't tell me which medication he believes it is coming from. Patient states that he doesn't believe that it is his diovan because he has been taking that for months. He states that he just itch after taking medications. Please advise. SW

## 2013-07-29 NOTE — Telephone Encounter (Signed)
If pt is having severe sxs or they worsen, he needs to be seen sooner than next Friday or at least come in for labs.

## 2013-07-29 NOTE — Telephone Encounter (Signed)
How long does it take for pt to start itching after taking meds? If it's 20 minutes, he needs to separate taking his pills by 30 minutes each so he can determine which one is making him itch and we can make adjustments from there

## 2013-07-29 NOTE — Telephone Encounter (Signed)
Spoke with patient and he stated that he takes his medication  in the morning and that the itching begins at night. Patient states that he doesn't only itch in one spot it spreads.Patient spoke with pharmacist and was advised to take benadryl for the itching. Spoke with Dr. Beverely Low was told to advise patient that he needs to be seen and continue the benadryl and use hydrocortisone until appointment on Friday.

## 2013-08-05 ENCOUNTER — Ambulatory Visit (INDEPENDENT_AMBULATORY_CARE_PROVIDER_SITE_OTHER): Payer: 59 | Admitting: Family Medicine

## 2013-08-05 ENCOUNTER — Encounter: Payer: Self-pay | Admitting: Family Medicine

## 2013-08-05 VITALS — BP 130/78 | HR 57 | Temp 98.3°F | Resp 16 | Wt 139.5 lb

## 2013-08-05 DIAGNOSIS — L299 Pruritus, unspecified: Secondary | ICD-10-CM | POA: Insufficient documentation

## 2013-08-05 DIAGNOSIS — IMO0002 Reserved for concepts with insufficient information to code with codable children: Secondary | ICD-10-CM

## 2013-08-05 LAB — BASIC METABOLIC PANEL
CO2: 25 mEq/L (ref 19–32)
GFR: 93.21 mL/min (ref >60.00)
Glucose, Bld: 87 mg/dL (ref 70–99)
Potassium: 3.8 mEq/L (ref 3.5–5.1)
Sodium: 140 mEq/L (ref 135–145)

## 2013-08-05 LAB — HEPATIC FUNCTION PANEL
AST: 12 U/L (ref 0–37)
Albumin: 4.2 g/dL (ref 3.5–5.2)
Alkaline Phosphatase: 60 U/L (ref 39–117)
Total Bilirubin: 0.5 mg/dL (ref 0.3–1.2)
Total Protein: 7.4 g/dL (ref 6.0–8.3)

## 2013-08-05 MED ORDER — PERMETHRIN 5 % EX CREA: 1.0000 "application " | TOPICAL_CREAM | Freq: Once | CUTANEOUS | Status: DC

## 2013-08-05 MED ORDER — HYDROXYZINE HCL 25 MG PO TABS: 25.0000 mg | ORAL_TABLET | Freq: Three times a day (TID) | ORAL | Status: DC | PRN

## 2013-08-05 NOTE — Assessment & Plan Note (Signed)
BP improved today.  Asymptomatic.  Check labs.  No anticipated med changes.

## 2013-08-05 NOTE — Assessment & Plan Note (Addendum)
New.  No evidence of rash, no contacts w/ similar.  Unclear if this is medication reaction but would be unlikely consider it is occuring at night.  Will treat for possible scabies.  Hydroxyzine prn.  Check labs for possible metabolic cause.  Will follow.

## 2013-08-05 NOTE — Progress Notes (Signed)
Pre visit review using our clinic review tool, if applicable. No additional management support is needed unless otherwise documented below in the visit note. 

## 2013-08-05 NOTE — Progress Notes (Signed)
   Subjective:    Patient ID: Daniel Gonzalez, male    DOB: 09-22-1955, 57 y.o.   MRN: [id number]  HPI HTN- chronic problem, better controlled today.  On Norvasc, Bystolic, Diovan.  Denies CP, SOB, HAs, visual changes.  Itching- pt reports sxs started ~1 week ago.  Worse at night.  Some relief w/ cortisone.  Benadryl causes too much sedation.  No contact w/ pets.  Has not slept anywhere new or different.  No visible rash.   Review of Systems For ROS see HPI     Objective:   Physical Exam  Vitals reviewed. Constitutional: He is oriented to person, place, and time. He appears well-developed and well-nourished. No distress.  HENT:  Head: Normocephalic and atraumatic.  Eyes: Conjunctivae and EOM are normal. Pupils are equal, round, and reactive to light.  Neck: Normal range of motion. Neck supple. No thyromegaly present.  Cardiovascular: Normal rate, regular rhythm, normal heart sounds and intact distal pulses.   No murmur heard. Pulmonary/Chest: Effort normal and breath sounds normal. No respiratory distress.  Abdominal: Soft. Bowel sounds are normal. He exhibits no distension.  Musculoskeletal: He exhibits no edema.  Lymphadenopathy:    He has no cervical adenopathy.  Neurological: He is alert and oriented to person, place, and time. No cranial nerve deficit.  Skin: Skin is warm and dry. No rash noted. No erythema.  Very dry skin  Psychiatric: He has a normal mood and affect. His behavior is normal.          Assessment & Plan:

## 2013-08-05 NOTE — Patient Instructions (Signed)
Follow up as needed Apply the Elimite from the neck down- sleep in it overnight, wash it off.  Wash all sheets and towels STOP the Benadryl START the Hydroxyzine as needed for itching No med changes at this time Call with any questions or concerns Happy Holidays!

## 2013-08-06 ENCOUNTER — Encounter: Payer: Self-pay | Admitting: General Practice

## 2013-08-29 ENCOUNTER — Telehealth: Payer: Self-pay | Admitting: *Deleted

## 2013-08-29 MED ORDER — METOPROLOL SUCCINATE ER 50 MG PO TB24: 50.0000 mg | ORAL_TABLET | Freq: Every day | ORAL | Status: DC

## 2013-08-29 NOTE — Telephone Encounter (Signed)
Rx sent and patient notified.

## 2013-08-29 NOTE — Telephone Encounter (Signed)
Can switch to Metoprolol XL 50mg  daily

## 2013-08-29 NOTE — Telephone Encounter (Signed)
Patient called and stated that he went to pick up his Bystolic from the pharmacy and stated that he is paying $95.00 for the medication. Patient would like to know if their is something cheaper that he could take instead. Patient also would like for [place] to know that he has switched insurance. He is now with [employer] and not [employer]. Please advise. SW

## 2013-08-31 ENCOUNTER — Encounter (INDEPENDENT_AMBULATORY_CARE_PROVIDER_SITE_OTHER): Payer: Self-pay

## 2013-08-31 ENCOUNTER — Ambulatory Visit (INDEPENDENT_AMBULATORY_CARE_PROVIDER_SITE_OTHER): Payer: BC Managed Care – PPO | Admitting: Neurology

## 2013-08-31 ENCOUNTER — Encounter: Payer: Self-pay | Admitting: Neurology

## 2013-08-31 VITALS — BP 144/87 | HR 58 | Ht 64.5 in | Wt 147.0 lb

## 2013-08-31 DIAGNOSIS — I671 Cerebral aneurysm, nonruptured: Secondary | ICD-10-CM

## 2013-08-31 NOTE — Patient Instructions (Signed)
Continue Plavix for secondary stroke prevention and strict control of hypertension with blood pressure goal below 130/90 and he was counseled to quit smoking. Return for followup in 3 months with Charlott Holler, NP or call earlier if necessary

## 2013-09-02 DIAGNOSIS — I671 Cerebral aneurysm, nonruptured: Secondary | ICD-10-CM | POA: Insufficient documentation

## 2013-09-02 NOTE — Progress Notes (Signed)
[facility] [address] [place]. [place] [address] [phone]       OFFICE FOLLOW-UP NOTE  [name] Date of Birth:  08-15-1956 Medical Record Number:  [id number]   HPI: 58 year male seen for first office f/u visit after [facility] admission 06/22/13 for stroke. He presented with 5 day h/o visual changes like a colorful circle that came and went which stated after a hedache which is unusual for him.He had outpatient MRi ordered which was positive for acute patchy and remote age small left pareital infarct. left PCA territory infarcts involving temporal and occipital lobes.MRA and CT angio showed no PCA occlusion but asymptomatic 10 x 7 x 6 mm wide mouth left carotid aneurysm projecting anteriorly.2DEcho showed normal Ef and cardiac source of embolism.Total cholesterol was 150, TG 84,HDL 36, LDL 97 mg %.HbA1c was 5.7 %.TEE showed no PFO or clot.Carotid dopplers showed no stenosis.He was stated on Plavix he states his visual clor distortions have resolved since he left the hospital and he has been doing well without any new problems. He is tolerating plavix well without bruising or bleeding. BP is well controlled and is 144/57 today. NHe is wearing right wrist splint for carpal tunnel.  ROS:   14 system review of systems is positive for right hand pain and numbness, tingling and all other systems negative  PMH:  Past Medical History  Diagnosis Date  . Cluster headache   . Blood in stool   . Migraine   . Frequent episodic tension-type headache   . Hypertension   . HIV infection   . Pneumonia   . Stroke     Social History:  History   Social History  . Marital Status: Divorced    Spouse Name: N/A    Number of Children: 3  . Years of Education: N/A   Occupational History  . lab tech [employer]   Social History Main Topics  . Smoking status: Current Every Day Smoker -- 0.75 packs/day for 35 years    Types: Cigarettes  . Smokeless tobacco: Never  Used  . Alcohol Use: 0.0 oz/week    0 drink(s) per week     Comment: no fun drinking by yourself  . Drug Use: Yes    Special: Marijuana  . Sexual Activity: Yes     Comment: declined condoms, smoked marijuana today   Other Topics Concern  . Not on file   Social History Narrative  . No narrative on file    Medications:   Current Outpatient Prescriptions on File Prior to Visit  Medication Sig Dispense Refill  . acetaminophen (TYLENOL) 500 MG tablet Take 1,000 mg by mouth every 6 (six) hours as needed.       . clopidogrel (PLAVIX) 75 MG tablet TAKE 1 TABLET BY MOUTH DAILY WITH BREAKFAST  30 tablet  6  . COMPLERA 200-25-300 MG TABS Take 1 tablet by mouth once a day . Must be taken with  a medium-sized meal.  90 tablet  4  . hydrOXYzine (ATARAX/VISTARIL) 25 MG tablet Take 1 tablet (25 mg total) by mouth 3 (three) times daily as needed.  30 tablet  0  . metoprolol succinate (TOPROL-XL) 50 MG 24 hr tablet Take 1 tablet (50 mg total) by mouth daily. Take with or immediately following a meal.  90 tablet  1  . mometasone (ELOCON) 0.1 % ointment       . nebivolol (BYSTOLIC) 10 MG tablet Take 1 tablet (10 mg total)  by mouth daily.  30 tablet  3  . permethrin (ELIMITE) 5 % cream Apply 1 application topically once.  60 g  0  . Polyethyl Glycol-Propyl Glycol (SYSTANE) 0.4-0.3 % SOLN Apply 1 drop to eye daily as needed (for dry eyes).      . sildenafil (VIAGRA) 100 MG tablet Take 100 mg by mouth daily as needed for erectile dysfunction.      . valsartan (DIOVAN) 320 MG tablet Take 320 mg by mouth daily.      . zidovudine (RETROVIR) 300 MG tablet Take 1 tablet (300 mg total) by mouth 2 (two) times daily.  180 tablet  4   No current facility-administered medications on file prior to visit.    Allergies:   Allergies  Allergen Reactions  . Asa [Aspirin] Palpitations    Speeds up heart rate  . Sulfamethoxazole-Tmp Ds Itching    Physical Exam General: well developed, well nourished, seated, in  no evident distress Head: head normocephalic and atraumatic. Orohparynx benign Neck: supple with no carotid or supraclavicular bruits Cardiovascular: regular rate and rhythm, no murmurs Musculoskeletal: no deformity. Right hand carpal tunnel splint Skin:  no rash/petichiae Vascular:  Normal pulses all extremities Filed Vitals:   08/31/13 1452  BP: 144/87  Pulse: 58   Neurologic Exam Mental Status: Awake and fully alert. Oriented to place and time. Recent and remote memory intact. Attention span, concentration and fund of knowledge appropriate. Mood and affect appropriate. Partial right upper quadrantic hemianopsia. Cranial Nerves: Fundoscopic exam reveals sharp disc margins. Pupils equal, briskly reactive to light. Extraocular movements full without nystagmus. Visual fields full to confrontation. Hearing intact. Facial sensation intact. Face, tongue, palate moves normally and symmetrically.  Motor: Normal bulk and tone. Normal strength in all tested extremity muscles. Sensory.: intact to touch and pinprick and vibratory sensation.  Coordination: Rapid alternating movements normal in all extremities. Finger-to-nose and heel-to-shin performed accurately bilaterally. Gait and Station: Arises from chair without difficulty. Stance is normal. Gait demonstrates normal stride length and balance . Able to heel, toe and tandem walk without difficulty.  Reflexes: 1+ and symmetric. Toes downgoing.      ASSESSMENT: 58 year male with multifocal left PCA embolic infarcts in November 2014 without identified source.Vascular risk factors of HT and smoking. Asymptomatic left cavernous ICA aneurysm    PLAN:  Continue Plavix for secondary stroke prevention and strict control of hypertension with blood pressure goal below 130/90 and he was counseled to quit smoking. Continue conservative treat,ment for asymptomatic left cavernous ICA aneurysm.Return for followup in 3 months with Charlott Holler, NP or call earlier  if necessary

## 2013-09-08 ENCOUNTER — Other Ambulatory Visit: Payer: Self-pay | Admitting: *Deleted

## 2013-09-08 DIAGNOSIS — B20 Human immunodeficiency virus [HIV] disease: Secondary | ICD-10-CM

## 2013-09-08 MED ORDER — EMTRICITAB-RILPIVIR-TENOFOV DF 200-25-300 MG PO TABS: ORAL_TABLET | ORAL | Status: DC

## 2013-09-08 MED ORDER — ZIDOVUDINE 300 MG PO TABS: 300.0000 mg | ORAL_TABLET | Freq: Two times a day (BID) | ORAL | Status: DC

## 2013-09-20 ENCOUNTER — Other Ambulatory Visit: Payer: Self-pay | Admitting: *Deleted

## 2013-09-20 DIAGNOSIS — B20 Human immunodeficiency virus [HIV] disease: Secondary | ICD-10-CM

## 2013-09-20 MED ORDER — EMTRICITAB-RILPIVIR-TENOFOV DF 200-25-300 MG PO TABS: ORAL_TABLET | ORAL | Status: DC

## 2013-09-20 MED ORDER — ZIDOVUDINE 300 MG PO TABS: 300.0000 mg | ORAL_TABLET | Freq: Two times a day (BID) | ORAL | Status: DC

## 2013-09-28 ENCOUNTER — Telehealth: Payer: Self-pay | Admitting: *Deleted

## 2013-09-28 NOTE — Telephone Encounter (Signed)
I will need to have time to review all of his past records to determine what alternatives he may have. Please ask Daniel Gonzalez if he would be able to afford the zidovudine co-pay this month so we can stay on his medicines until his upcoming visit on March 3.

## 2013-09-28 NOTE — Telephone Encounter (Signed)
Patient called to advise that his pharmacy advised him that his Zidovudine now has a generic and is no longer covered by the copay card which means his out of pocket has increased. He was calling to see if there is anything he can take with his Combivir because the cost will create a hardship. Advised the patient not sure if anything can be substituted for zidovudine but will check with Dr Megan Salon and call him back with an answer.

## 2013-10-07 ENCOUNTER — Other Ambulatory Visit: Payer: BC Managed Care – PPO

## 2013-10-07 DIAGNOSIS — Z79899 Other long term (current) drug therapy: Secondary | ICD-10-CM

## 2013-10-07 DIAGNOSIS — B20 Human immunodeficiency virus [HIV] disease: Secondary | ICD-10-CM

## 2013-10-07 DIAGNOSIS — Z113 Encounter for screening for infections with a predominantly sexual mode of transmission: Secondary | ICD-10-CM

## 2013-10-07 LAB — CBC
HEMATOCRIT: 37.1 % — AB (ref 39.0–52.0)
Hemoglobin: 13 g/dL (ref 13.0–17.0)
MCH: 37.2 pg — ABNORMAL HIGH (ref 26.0–34.0)
MCHC: 35 g/dL (ref 30.0–36.0)
MCV: 106.3 fL — ABNORMAL HIGH (ref 78.0–100.0)
Platelets: 271 10*3/uL (ref 150–400)
RBC: 3.49 MIL/uL — ABNORMAL LOW (ref 4.22–5.81)
RDW: 15.8 % — AB (ref 11.5–15.5)
WBC: 7.4 10*3/uL (ref 4.0–10.5)

## 2013-10-07 LAB — COMPREHENSIVE METABOLIC PANEL
ALBUMIN: 4.5 g/dL (ref 3.5–5.2)
ALT: 9 U/L (ref 0–53)
AST: 18 U/L (ref 0–37)
Alkaline Phosphatase: 66 U/L (ref 39–117)
BUN: 13 mg/dL (ref 6–23)
CHLORIDE: 107 meq/L (ref 96–112)
CO2: 26 mEq/L (ref 19–32)
Calcium: 9.3 mg/dL (ref 8.4–10.5)
Creat: 1.07 mg/dL (ref 0.50–1.35)
GLUCOSE: 55 mg/dL — AB (ref 70–99)
Potassium: 4.5 mEq/L (ref 3.5–5.3)
Sodium: 139 mEq/L (ref 135–145)
Total Bilirubin: 0.4 mg/dL (ref 0.2–1.2)
Total Protein: 7.1 g/dL (ref 6.0–8.3)

## 2013-10-07 LAB — T-HELPER CELL (CD4) - (RCID CLINIC ONLY)
CD4 T CELL ABS: 910 /uL (ref 400–2700)
CD4 T CELL HELPER: 27 % — AB (ref 33–55)

## 2013-10-07 LAB — LIPID PANEL
CHOLESTEROL: 139 mg/dL (ref 0–200)
HDL: 35 mg/dL — AB (ref >39)
LDL Cholesterol: 91 mg/dL (ref 0–99)
TRIGLYCERIDES: 63 mg/dL (ref <150)
Total CHOL/HDL Ratio: 4 Ratio
VLDL: 13 mg/dL (ref 0–40)

## 2013-10-07 LAB — RPR

## 2013-10-10 LAB — HIV-1 RNA QUANT-NO REFLEX-BLD

## 2013-10-18 ENCOUNTER — Encounter: Payer: Self-pay | Admitting: Internal Medicine

## 2013-10-18 ENCOUNTER — Ambulatory Visit (INDEPENDENT_AMBULATORY_CARE_PROVIDER_SITE_OTHER): Payer: BC Managed Care – PPO | Admitting: Internal Medicine

## 2013-10-18 VITALS — Temp 98.0°F | Ht 64.0 in | Wt 141.5 lb

## 2013-10-18 DIAGNOSIS — B20 Human immunodeficiency virus [HIV] disease: Secondary | ICD-10-CM

## 2013-10-18 DIAGNOSIS — Z23 Encounter for immunization: Secondary | ICD-10-CM

## 2013-10-18 MED ORDER — COBICISTAT 150 MG PO TABS: 1.0000 | ORAL_TABLET | Freq: Every day | ORAL | Status: DC

## 2013-10-18 MED ORDER — EMTRICITABINE-TENOFOVIR DF 200-300 MG PO TABS: 1.0000 | ORAL_TABLET | Freq: Every day | ORAL | Status: DC

## 2013-10-18 MED ORDER — ATAZANAVIR SULFATE 300 MG PO CAPS: 300.0000 mg | ORAL_CAPSULE | Freq: Every day | ORAL | Status: DC

## 2013-10-18 NOTE — Progress Notes (Signed)
Patient ID: Daniel Gonzalez, male   DOB: [birth year], 58 y.o.   MRN: [id number]          [facility]  Patient Active Problem List   Diagnosis Date Noted  . Cerebral aneurysm, nonruptured 09/02/2013    Priority: High  . Acute CVA (cerebrovascular accident) 06/22/2013    Priority: High  . HYPERTENSION NEC 05/01/2010    Priority: High  . CIGARETTE SMOKER 09/17/2006    Priority: High  . HIV DISEASE 09/17/2006    Priority: Medium  . Itching 08/05/2013  . Visual disturbance 06/22/2013  . Hypopigmentation 06/20/2013  . Hematuria, gross 02/11/2013  . Routine general medical examination at a health care facility 01/07/2013  . Allergic rhinitis 09/22/2012  . Migraine headache 05/01/2010  . CARPAL TUNNEL SYNDROME 01/02/2010  . HEADACHE, CHRONIC, HX OF 01/02/2009  . LOSS OF APPETITE 10/03/2008  . PILONIDAL CYST WITHOUT MENTION OF ABSCESS 05/29/2008  . GENITAL HERPES 09/17/2006  . ERECTILE DYSFUNCTION 09/17/2006  . 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 PNEUMOCOCCAL PNEUMONIA 09/17/2006  . DIVERTICULOSIS, COLON W/O HEM 09/17/2006  . BOILS, RECURRENT 09/17/2006  . FRACTURE, FINGER 09/17/2006    Patient's Medications  New Prescriptions   EMTRICITABINE-TENOFOVIR (TRUVADA) 200-300 MG PER TABLET    Take 1 tablet by mouth daily.  Previous Medications   ACETAMINOPHEN (TYLENOL) 500 MG TABLET    Take 1,000 mg by mouth every 6 (six) hours as needed.    CLOPIDOGREL (PLAVIX) 75 MG TABLET    TAKE 1 TABLET BY MOUTH DAILY WITH BREAKFAST   HYDROXYZINE (ATARAX/VISTARIL) 25 MG TABLET    Take 1 tablet (25 mg total) by mouth 3 (three) times daily as needed.   METOPROLOL SUCCINATE (TOPROL-XL) 50 MG 24 HR TABLET    Take 1 tablet (50 mg total) by mouth daily. Take with or immediately following a meal.   MOMETASONE (ELOCON) 0.1 % OINTMENT       NEBIVOLOL (BYSTOLIC) 10 MG TABLET    Take 1 tablet (10 mg total) by mouth daily.   PERMETHRIN (ELIMITE) 5 % CREAM    Apply 1  application topically once.   POLYETHYL GLYCOL-PROPYL GLYCOL (SYSTANE) 0.4-0.3 % SOLN    Apply 1 drop to eye daily as needed (for dry eyes).   SILDENAFIL (VIAGRA) 100 MG TABLET    Take 100 mg by mouth daily as needed for erectile dysfunction.   VALSARTAN (DIOVAN) 320 MG TABLET    Take 320 mg by mouth daily.  Modified Medications   No medications on file  Discontinued Medications   EMTRICITAB-RILPIVIR-TENOFOVIR (COMPLERA) 200-25-300 MG TABS    Take 1 tablet by mouth once a day . Must be taken with  a medium-sized meal.   ZIDOVUDINE (RETROVIR) 300 MG TABLET    Take 1 tablet (300 mg total) by mouth 2 (two) times daily.    Subjective: Daniel Gonzalez is in for his routine visit. He has not missed any doses of his Complera or Retrovir but he is having some difficulty managing his co-pay for Retrovir. He has recovered fully from his stroke and is back at work. He continues to smoke cigarettes and has no current plans to quit. Review of Systems: Pertinent items are noted in HPI.  Past Medical History  Diagnosis Date  . Cluster headache   . Blood in stool   . Migraine   . Frequent episodic tension-type headache   . Hypertension   . HIV infection   . Pneumonia   . Stroke  History  Substance Use Topics  . Smoking status: Current Every Day Smoker -- 0.50 packs/day for 35 years    Types: Cigarettes  . Smokeless tobacco: Never Used  . Alcohol Use: 0.0 oz/week    0 drink(s) per week     Comment: no fun drinking by yourself    Family History  Problem Relation Age of Onset  . Arthritis Mother   . Heart disease Mother   . Hypertension Mother   . Diabetes Mother   . Arthritis Father   . Heart disease Father   . Hypertension Father   . Colon cancer Neg Hx   . Lung cancer Brother     Allergies  Allergen Reactions  . Asa [Aspirin] Palpitations    Speeds up heart rate  . Sulfamethoxazole-Tmp Ds Itching    Objective: Temp: 98 F (36.7 C) (03/03 1603) Temp src: Oral (03/03 1603)  Body  mass index is 24.28 kg/(m^2).  General: He is in no distress Oral: No oropharyngeal lesions Skin: No rash Lungs: Clear Cor: Regular S1 and S2 no murmurs Abdomen: Soft and nontender Joints and extremities: No acute abnormalities Neuro: Alert with normal speech conversation. Normal strength in all extremities Mood and affect: Normal  Lab Results Lab Results  Component Value Date   WBC 7.4 10/07/2013   HGB 13.0 10/07/2013   HCT 37.1* 10/07/2013   MCV 106.3* 10/07/2013   PLT 271 10/07/2013    Lab Results  Component Value Date   CREATININE 1.07 10/07/2013   BUN 13 10/07/2013   NA 139 10/07/2013   K 4.5 10/07/2013   CL 107 10/07/2013   CO2 26 10/07/2013    Lab Results  Component Value Date   ALT 9 10/07/2013   AST 18 10/07/2013   ALKPHOS 66 10/07/2013   BILITOT 0.4 10/07/2013    Lab Results  Component Value Date   CHOL 139 10/07/2013   HDL 35* 10/07/2013   LDLCALC 91 10/07/2013   TRIG 63 10/07/2013   CHOLHDL 4.0 10/07/2013    Lab Results HIV 1 RNA Quant (copies/mL)  Date Value  10/07/2013 <20   05/13/2013 <20   09/02/2012 <20      CD4 T Cell Abs (/uL)  Date Value  10/07/2013 910   05/13/2013 860   09/02/2012 820      Assessment: His HIV infection remains under very good control. I will change his antiretroviral regimen to Truvada plus Evotaz. We will be able to get him co-pay cards for both of these drugs to help cover his financial outlay.  Told him at length about the extreme importance of quitting cigarettes and have given him written information about the [place] quit line. He states that he will consider trying to come up with a plan to quit.  He is drinking much less alcohol and states that he only has maybe one to 2 beers per week.  Plan: 1. Change antiretroviral regimen as noted above 2. Cigarette cessation counseling provided 3. Followup after lab work in [place] months   Michel Bickers, MD [facility] [place] [phone]  pager   [phone] cell 10/18/2013, 5:14 PM

## 2013-10-24 ENCOUNTER — Other Ambulatory Visit: Payer: Self-pay | Admitting: Family Medicine

## 2013-10-24 NOTE — Telephone Encounter (Signed)
Med filled.  

## 2013-10-27 ENCOUNTER — Telehealth: Payer: Self-pay | Admitting: *Deleted

## 2013-10-27 NOTE — Telephone Encounter (Signed)
RN will send pt information to apply for [facility] assistance with high deductibles.

## 2013-12-01 ENCOUNTER — Encounter: Payer: Self-pay | Admitting: Nurse Practitioner

## 2013-12-16 ENCOUNTER — Telehealth: Payer: Self-pay | Admitting: *Deleted

## 2013-12-16 NOTE — Telephone Encounter (Signed)
Patient called to advise that since he started taking Truvada and Evotaz he has started to itch. He advised he started taking the medication last week and since the itch has gradually gotten worse to the point he can not sleep at night. He states that there is no rash just an intense itch from the inside and all over his body. Patient reports benadrly helps but makes him sleepy and he can not take it and work. He advised he is not going to take his dose today until he hears from the doctor and wants to know if he can go back to his old medications or if the itching will get better. Advised the patient will let the doctor know and call him back once the doctor responds. He advised to call on his cell number as he is going to work.   Error occurred while typing this note and my computer shut down and now it will not allow me to close the note.

## 2013-12-16 NOTE — Telephone Encounter (Signed)
Please tell Daniel Gonzalez that it's okay for him to change back to Complera and Retrovir. Ask him to call us next week to let me know how he is doing.

## 2013-12-20 NOTE — Telephone Encounter (Signed)
Pt stated that he has stopped all medication, the itching is getting better.   Landis Gandy, RN

## 2013-12-21 ENCOUNTER — Telehealth: Payer: Self-pay | Admitting: *Deleted

## 2013-12-21 NOTE — Telephone Encounter (Signed)
Spoke with patient, relayed Dr. Hale Bogus advice to return to his previous regimen of Complera + Retrovir.  Patient verbalized agreement, but was unable to discuss details (pt currently at work).  Pt will review his insurance policy and call back with 1) the pharmacy name and 2) any needs for copay assistance. Landis Gandy, RN

## 2013-12-22 ENCOUNTER — Telehealth: Payer: Self-pay | Admitting: *Deleted

## 2013-12-22 DIAGNOSIS — B20 Human immunodeficiency virus [HIV] disease: Secondary | ICD-10-CM

## 2013-12-22 MED ORDER — EMTRICITAB-RILPIVIR-TENOFOV DF 200-25-300 MG PO TABS: 1.0000 | ORAL_TABLET | Freq: Every day | ORAL | Status: DC

## 2013-12-22 MED ORDER — ZIDOVUDINE 300 MG PO TABS: 300.0000 mg | ORAL_TABLET | Freq: Two times a day (BID) | ORAL | Status: DC

## 2013-12-22 NOTE — Telephone Encounter (Signed)
Pt's insurance [facility] now [facility].  Requesting all HIV rxes be sent to [facility].  Phone call to [facility].  Discontinued "old" regimen and gave orders for Complera and Retrovir per Dr. Megan Salon.  Complera covered by Co-pay card on file.  Pt notified to fax [employer] Card to [facility] to cover Retrovir rx.  Pt given fax number for [facility].

## 2013-12-23 NOTE — Telephone Encounter (Signed)
Patient has been informed he can go back to his old medications and they have been called in.

## 2013-12-27 ENCOUNTER — Ambulatory Visit: Payer: BC Managed Care – PPO | Admitting: Nurse Practitioner

## 2014-02-21 ENCOUNTER — Other Ambulatory Visit (HOSPITAL_COMMUNITY): Payer: Self-pay | Admitting: Family Medicine

## 2014-02-21 NOTE — Telephone Encounter (Signed)
Med filled.  

## 2014-02-28 ENCOUNTER — Other Ambulatory Visit: Payer: Self-pay | Admitting: Family Medicine

## 2014-02-28 NOTE — Telephone Encounter (Signed)
Med filled #[phone] and letter mailed to pt.

## 2014-03-03 ENCOUNTER — Other Ambulatory Visit: Payer: Self-pay | Admitting: Family Medicine

## 2014-03-03 NOTE — Telephone Encounter (Signed)
Med filled.  

## 2014-03-05 ENCOUNTER — Other Ambulatory Visit: Payer: Self-pay | Admitting: Family Medicine

## 2014-03-06 NOTE — Telephone Encounter (Signed)
Med filled.  

## 2014-03-09 ENCOUNTER — Ambulatory Visit (INDEPENDENT_AMBULATORY_CARE_PROVIDER_SITE_OTHER): Payer: BC Managed Care – PPO | Admitting: Nurse Practitioner

## 2014-03-09 ENCOUNTER — Encounter: Payer: Self-pay | Admitting: Nurse Practitioner

## 2014-03-09 VITALS — BP 135/82 | HR 55

## 2014-03-09 DIAGNOSIS — I6350 Cerebral infarction due to unspecified occlusion or stenosis of unspecified cerebral artery: Secondary | ICD-10-CM

## 2014-03-09 DIAGNOSIS — I671 Cerebral aneurysm, nonruptured: Secondary | ICD-10-CM

## 2014-03-09 DIAGNOSIS — I639 Cerebral infarction, unspecified: Secondary | ICD-10-CM

## 2014-03-09 NOTE — Patient Instructions (Signed)
Continue Plavix for secondary stroke prevention and strict control of hypertension with blood pressure goal below 130/90 and he was counseled to quit smoking. Continue conservative treatment for asymptomatic left cavernous ICA aneurysm. Return for followup in 6 months or call earlier if necessary.  Stroke Prevention Some medical conditions and behaviors are associated with an increased chance of having a stroke. You may prevent a stroke by making healthy choices and managing medical conditions. HOW CAN I REDUCE MY RISK OF HAVING A STROKE?   Stay physically active. Get at least 30 minutes of activity on most or all days.  Do not smoke. It may also be helpful to avoid exposure to secondhand smoke.  Limit alcohol use. Moderate alcohol use is considered to be:  No more than 2 drinks per day for men.  No more than 1 drink per day for nonpregnant women.  Eat healthy foods. This involves:  Eating 5 or more servings of fruits and vegetables a day.  Making dietary changes that address high blood pressure (hypertension), high cholesterol, diabetes, or obesity.  Manage your cholesterol levels.  Making food choices that are high in fiber and low in saturated fat, trans fat, and cholesterol may control cholesterol levels.  Take any prescribed medicines to control cholesterol as directed by your health care provider.  Manage your diabetes.  Controlling your carbohydrate and sugar intake is recommended to manage diabetes.  Take any prescribed medicines to control diabetes as directed by your health care provider.  Control your hypertension.  Making food choices that are low in salt (sodium), saturated fat, trans fat, and cholesterol is recommended to manage hypertension.  Take any prescribed medicines to control hypertension as directed by your health care provider.  Maintain a healthy weight.  Reducing calorie intake and making food choices that are low in sodium, saturated fat, trans  fat, and cholesterol are recommended to manage weight.  Stop drug abuse.  Avoid taking birth control pills.  Talk to your health care provider about the risks of taking birth control pills if you are over 59 years old, smoke, get migraines, or have ever had a blood clot.  Get evaluated for sleep disorders (sleep apnea).  Talk to your health care provider about getting a sleep evaluation if you snore a lot or have excessive sleepiness.  Take medicines only as directed by your health care provider.  For some people, aspirin or blood thinners (anticoagulants) are helpful in reducing the risk of forming abnormal blood clots that can lead to stroke. If you have the irregular heart rhythm of atrial fibrillation, you should be on a blood thinner unless there is a good reason you cannot take them.  Understand all your medicine instructions.  Make sure that other conditions (such as anemia or atherosclerosis) are addressed. SEEK IMMEDIATE MEDICAL CARE IF:   You have sudden weakness or numbness of the face, arm, or leg, especially on one side of the body.  Your face or eyelid droops to one side.  You have sudden confusion.  You have trouble speaking (aphasia) or understanding.  You have sudden trouble seeing in one or both eyes.  You have sudden trouble walking.  You have dizziness.  You have a loss of balance or coordination.  You have a sudden, severe headache with no known cause.  You have new chest pain or an irregular heartbeat. Any of these symptoms may represent a serious problem that is an emergency. Do not wait to see if the symptoms will go  away. Get medical help at once. Call your local emergency services (911 in [place] Do not drive yourself to the hospital. Document Released: 09/11/2004 Document Revised: 12/19/2013 Document Reviewed: 02/04/2013 [facility] Patient Information 2015 [place], [place]. This information is not intended to replace advice given to you by your health  care provider. Make sure you discuss any questions you have with your health care provider.

## 2014-03-09 NOTE — Progress Notes (Signed)
PATIENT: Daniel Gonzalez DOB: 1956-04-13  REASON FOR VISIT: routine follow up for stroke HISTORY FROM: patient  HISTORY OF PRESENT ILLNESS: [name] seen for first office f/u visit after [facility] admission 06/22/13 for stroke. He presented with 5 day h/o visual changes like a colorful circle that came and went which stated after a hedache which is unusual for him.He had outpatient MRi ordered which was positive for acute patchy and remote age small left pareital infarct. left PCA territory infarcts involving temporal and occipital lobes.MRA and CT angio showed no PCA occlusion but asymptomatic 10 x 7 x 6 mm wide mouth left carotid aneurysm projecting anteriorly.2DEcho showed normal Ef and cardiac source of embolism.Total cholesterol was 150, TG 84,HDL Daniel, LDL 97 mg %.HbA1c was 5.7 %.TEE showed no PFO or clot.Carotid dopplers showed no stenosis.He was stated on Plavix he states his visual color distortions have resolved since he left the hospital and he has been doing well without any new problems. He is tolerating plavix well without bruising or bleeding. BP is well controlled and is 144/57 today. NHe is wearing right wrist splint for carpal tunnel.   Update 03/09/14 (LL): Daniel Gonzalez comes in for 6 months stroke followup, last visit was 08/22/2013 with Dr. Leonie Man. He states he has had no recurrent headaches or visual disturbances since last visit. His blood pressure is well controlled, it is 135/82 in the office today. He is tolerating Plavix well with no signs of significant bleeding or bruising. Last lipid panel shows total cholesterol of 139 and LDL cholesterol of 91. He is not interested in quitting smoking at this time. He has no new complaints.  REVIEW OF SYSTEMS: Full 14 system review of systems performed and notable only for: nothing.  ALLERGIES: Allergies  Allergen Reactions  . Asa [Aspirin] Palpitations    Speeds up heart rate  . Sulfamethoxazole-Tmp Ds Itching    HOME  MEDICATIONS: Outpatient Prescriptions Prior to Visit  Medication Sig Dispense Refill  . acetaminophen (TYLENOL) 500 MG tablet Take 1,000 mg by mouth every 6 (six) hours as needed.       Marland Kitchen amLODipine (NORVASC) 5 MG tablet TAKE 1 TABLET BY MOUTH EVERY DAY  30 tablet  0  . clopidogrel (PLAVIX) 75 MG tablet TAKE 1 TABLET BY MOUTH DAILY WITH BREAKFAST  90 tablet  0  . Emtricitab-Rilpivir-Tenofovir (COMPLERA) 200-25-300 MG TABS Take 1 tablet by mouth daily.  90 tablet  3  . mometasone (ELOCON) 0.1 % ointment daily.       . nebivolol (BYSTOLIC) 10 MG tablet Take 1 tablet (10 mg total) by mouth daily.  30 tablet  3  . Polyethyl Glycol-Propyl Glycol (SYSTANE) 0.4-0.3 % SOLN Apply 1 drop to eye daily as needed (for dry eyes).      . sildenafil (VIAGRA) 100 MG tablet Take 100 mg by mouth daily as needed for erectile dysfunction.      . valsartan (DIOVAN) 320 MG tablet Take 320 mg by mouth daily.      . zidovudine (RETROVIR) 300 MG tablet Take 1 tablet (300 mg total) by mouth 2 (two) times daily.  180 tablet  3  . hydrOXYzine (ATARAX/VISTARIL) 25 MG tablet Take 1 tablet (25 mg total) by mouth 3 (three) times daily as needed.  30 tablet  0  . metoprolol succinate (TOPROL-XL) 50 MG 24 hr tablet TAKE 1 TABLET BY MOUTH DAILY. TAKE WITH MEALS OR IMMEDIATELY FOLLOWING MEAL  90 tablet  0  . permethrin (ELIMITE)  5 % cream Apply 1 application topically once.  60 g  0   No facility-administered medications prior to visit.    PHYSICAL EXAM Filed Vitals:   03/09/14 1354  BP: 135/82  Pulse: 55   There is no weight on file to calculate BMI. No exam data present No flowsheet data found.  No flowsheet data found.  Physical Exam  General: well developed, well nourished, seated, in no evident distress  Head: head normocephalic and atraumatic. Orohparynx benign  Neck: supple with no carotid or supraclavicular bruits  Cardiovascular: regular rate and rhythm, no murmurs  Musculoskeletal: no deformity. Right hand  carpal tunnel splint  Skin: no rash/petichiae  Vascular: Normal pulses all extremities   Neurologic Exam  Mental Status: Awake and fully alert. Oriented to place and time. Recent and remote memory intact. Attention span, concentration and fund of knowledge appropriate. Mood and affect appropriate. Partial right upper quadrantic hemianopsia.  Cranial Nerves: Fundoscopic exam reveals sharp disc margins. Pupils equal, briskly reactive to light. Extraocular movements full without nystagmus. Visual fields full to confrontation. Hearing intact. Facial sensation intact. Face, tongue, palate moves normally and symmetrically.  Motor: Normal bulk and tone. Normal strength in all tested extremity muscles.  Sensory.: intact to touch and pinprick and vibratory sensation.  Coordination: Rapid alternating movements normal in all extremities. Finger-to-nose and heel-to-shin performed accurately bilaterally.  Gait and Station: Arises from chair without difficulty. Stance is normal. Gait demonstrates normal stride length and balance . Able to heel, toe and tandem walk without difficulty.  Reflexes: 1+ and symmetric. Toes downgoing.   ASSESSMENT: [name] with multifocal left PCA embolic infarcts in November 2014 without identified source.Vascular risk factors of HT and smoking. Asymptomatic left cavernous ICA aneurysm.  He has no residual deficits.  PLAN:  Continue Plavix for secondary stroke prevention and strict control of hypertension with blood pressure goal below 130/90 and he was counseled to quit smoking. Continue conservative treatment for asymptomatic left cavernous ICA aneurysm. Return for followup in 6 months or call earlier if necessary.  Rudi Rummage Malvina Schadler, MSN, FNP-BC, A/GNP-C 03/09/2014, 2:04 PM [facility] [address], [place], [place] [address] [phone]  Note: This document was prepared with digital dictation and possible smart phrase technology. Any  transcriptional errors that result from this process are unintentional.

## 2014-03-10 NOTE — Progress Notes (Signed)
I agree with the above plan 

## 2014-03-21 ENCOUNTER — Other Ambulatory Visit: Payer: Self-pay | Admitting: Family Medicine

## 2014-03-21 NOTE — Telephone Encounter (Signed)
Med filled.  

## 2014-04-03 ENCOUNTER — Other Ambulatory Visit: Payer: Self-pay | Admitting: Family Medicine

## 2014-04-03 NOTE — Telephone Encounter (Signed)
Me filled #[phone] and letter mailed to pt.

## 2014-04-28 ENCOUNTER — Encounter: Payer: Self-pay | Admitting: General Practice

## 2014-04-28 ENCOUNTER — Ambulatory Visit (INDEPENDENT_AMBULATORY_CARE_PROVIDER_SITE_OTHER): Payer: BC Managed Care – PPO | Admitting: Family Medicine

## 2014-04-28 ENCOUNTER — Encounter: Payer: Self-pay | Admitting: Family Medicine

## 2014-04-28 VITALS — BP 128/80 | HR 65 | Temp 97.9°F | Resp 16 | Wt 135.5 lb

## 2014-04-28 DIAGNOSIS — F528 Other sexual dysfunction not due to a substance or known physiological condition: Secondary | ICD-10-CM

## 2014-04-28 DIAGNOSIS — Z23 Encounter for immunization: Secondary | ICD-10-CM

## 2014-04-28 DIAGNOSIS — IMO0002 Reserved for concepts with insufficient information to code with codable children: Secondary | ICD-10-CM

## 2014-04-28 LAB — BASIC METABOLIC PANEL
BUN: 14 mg/dL (ref 6–23)
CO2: 28 meq/L (ref 19–32)
Calcium: 9.6 mg/dL (ref 8.4–10.5)
Chloride: 106 mEq/L (ref 96–112)
Creatinine, Ser: 1.2 mg/dL (ref 0.4–1.5)
GFR: 78.3 mL/min (ref >60.00)
GLUCOSE: 100 mg/dL — AB (ref 70–99)
POTASSIUM: 4.6 meq/L (ref 3.5–5.1)
Sodium: 140 mEq/L (ref 135–145)

## 2014-04-28 MED ORDER — NEBIVOLOL HCL 10 MG PO TABS: 10.0000 mg | ORAL_TABLET | Freq: Every day | ORAL | Status: DC

## 2014-04-28 NOTE — Assessment & Plan Note (Signed)
Chronic problem.  Well controlled.  Pt is having ED sxs since switching to Metoprolol due to insurance reasons.  Due to side effects will attempt to switch back to Bystolic as pt was asymptomatic on this.  Pt expressed understanding and is in agreement w/ plan.

## 2014-04-28 NOTE — Progress Notes (Signed)
Pre visit review using our clinic review tool, if applicable. No additional management support is needed unless otherwise documented below in the visit note. 

## 2014-04-28 NOTE — Assessment & Plan Note (Signed)
Deteriorated since switching to Metoprolol.  Will attempt to resume Bystolic.

## 2014-04-28 NOTE — Progress Notes (Signed)
   Subjective:    Patient ID: Daniel Gonzalez, male    DOB: Jul 22, 1956, 58 y.o.   MRN: [id number]  HPI HTN- chronic problem, currently on Amlodipine, Valsartan, and was on Bystolic but this was switched due to insurance reasons to Metoprolol in July.  No CP, SOB, HAs, visual changes, edema.  ED- ongoing issue.  Previously was doing well w/ Viagra but when Bystolic was switched to Metoprolol, started having issues w/ maintaining erection longer than 2-3 minutes.   Review of Systems For ROS see HPI     Objective:   Physical Exam  Vitals reviewed. Constitutional: He is oriented to person, place, and time. He appears well-developed and well-nourished. No distress.  Smells of cigarette smoke  HENT:  Head: Normocephalic and atraumatic.  Eyes: Conjunctivae and EOM are normal. Pupils are equal, round, and reactive to light.  Neck: Normal range of motion. Neck supple. No thyromegaly present.  Cardiovascular: Normal rate, regular rhythm, normal heart sounds and intact distal pulses.   No murmur heard. Pulmonary/Chest: Effort normal and breath sounds normal. No respiratory distress.  Abdominal: Soft. Bowel sounds are normal. He exhibits no distension.  Musculoskeletal: He exhibits no edema.  Lymphadenopathy:    He has no cervical adenopathy.  Neurological: He is alert and oriented to person, place, and time. No cranial nerve deficit.  Skin: Skin is warm and dry.  Psychiatric: He has a normal mood and affect. His behavior is normal.          Assessment & Plan:

## 2014-04-28 NOTE — Patient Instructions (Signed)
Schedule your complete physical in 6 months STOP the Metoprolol, RESTART the Bystolic (we may need to complete forms so don't stop the Metoprolol until we get it sorted out) Continue the Amlodipine, Valsartan daily. Call with any questions or concerns Hang in there!

## 2014-05-05 ENCOUNTER — Other Ambulatory Visit: Payer: BC Managed Care – PPO

## 2014-05-05 DIAGNOSIS — B20 Human immunodeficiency virus [HIV] disease: Secondary | ICD-10-CM

## 2014-05-05 LAB — COMPREHENSIVE METABOLIC PANEL
ALBUMIN: 4.2 g/dL (ref 3.5–5.2)
ALK PHOS: 65 U/L (ref 39–117)
ALT: 8 U/L (ref 0–53)
AST: 14 U/L (ref 0–37)
BUN: 11 mg/dL (ref 6–23)
CO2: 26 meq/L (ref 19–32)
Calcium: 9.4 mg/dL (ref 8.4–10.5)
Chloride: 107 mEq/L (ref 96–112)
Creat: 1.06 mg/dL (ref 0.50–1.35)
GLUCOSE: 74 mg/dL (ref 70–99)
Potassium: 4.5 mEq/L (ref 3.5–5.3)
SODIUM: 140 meq/L (ref 135–145)
TOTAL PROTEIN: 6.6 g/dL (ref 6.0–8.3)
Total Bilirubin: 0.5 mg/dL (ref 0.2–1.2)

## 2014-05-05 LAB — T-HELPER CELL (CD4) - (RCID CLINIC ONLY)
CD4 % Helper T Cell: 30 % — ABNORMAL LOW (ref 33–55)
CD4 T Cell Abs: 900 /uL (ref 400–2700)

## 2014-05-06 ENCOUNTER — Other Ambulatory Visit: Payer: Self-pay | Admitting: Family Medicine

## 2014-05-06 LAB — HIV-1 RNA QUANT-NO REFLEX-BLD: HIV 1 RNA Quant: <20 copies/mL (ref <20)

## 2014-05-08 NOTE — Telephone Encounter (Signed)
Med filled.  

## 2014-05-18 ENCOUNTER — Ambulatory Visit: Payer: BC Managed Care – PPO | Admitting: Internal Medicine

## 2014-05-20 ENCOUNTER — Other Ambulatory Visit (HOSPITAL_COMMUNITY): Payer: Self-pay | Admitting: Family Medicine

## 2014-05-22 NOTE — Telephone Encounter (Signed)
Med filled.  

## 2014-06-01 ENCOUNTER — Other Ambulatory Visit: Payer: Self-pay | Admitting: Family Medicine

## 2014-06-01 NOTE — Telephone Encounter (Signed)
Med filled.  

## 2014-06-05 ENCOUNTER — Encounter: Payer: Self-pay | Admitting: Internal Medicine

## 2014-06-05 ENCOUNTER — Ambulatory Visit (INDEPENDENT_AMBULATORY_CARE_PROVIDER_SITE_OTHER): Payer: BC Managed Care – PPO | Admitting: Internal Medicine

## 2014-06-05 ENCOUNTER — Telehealth: Payer: Self-pay | Admitting: Family Medicine

## 2014-06-05 VITALS — BP 150/93 | HR 57 | Temp 98.1°F | Wt 139.0 lb

## 2014-06-05 DIAGNOSIS — B20 Human immunodeficiency virus [HIV] disease: Secondary | ICD-10-CM | POA: Diagnosis not present

## 2014-06-05 NOTE — Telephone Encounter (Signed)
Noted, pharmacy kept sending the refill request to our office. I called and cancelled the automatic refills.

## 2014-06-05 NOTE — Progress Notes (Signed)
Patient ID: Daniel Gonzalez, male   DOB: [birth year], 58 y.o.   MRN: [id number]          Patient Active Problem List   Diagnosis Date Noted  . Cerebral aneurysm, nonruptured 09/02/2013    Priority: High  . Acute CVA (cerebrovascular accident) 06/22/2013    Priority: High  . HYPERTENSION NEC 05/01/2010    Priority: High  . CIGARETTE SMOKER 09/17/2006    Priority: High  . Human immunodeficiency virus (HIV) disease 09/17/2006    Priority: Medium  . Itching 08/05/2013  . Visual disturbance 06/22/2013  . Hypopigmentation 06/20/2013  . Hematuria, gross 02/11/2013  . Routine general medical examination at a health care facility 01/07/2013  . Allergic rhinitis 09/22/2012  . Migraine headache 05/01/2010  . CARPAL TUNNEL SYNDROME 01/02/2010  . HEADACHE, CHRONIC, HX OF 01/02/2009  . LOSS OF APPETITE 10/03/2008  . PILONIDAL CYST WITHOUT MENTION OF ABSCESS 05/29/2008  . GENITAL HERPES 09/17/2006  . ERECTILE DYSFUNCTION 09/17/2006  . 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 PNEUMOCOCCAL PNEUMONIA 09/17/2006  . DIVERTICULOSIS, COLON W/O HEM 09/17/2006  . BOILS, RECURRENT 09/17/2006  . FRACTURE, FINGER 09/17/2006    Patient's Medications  New Prescriptions   No medications on file  Previous Medications   ACETAMINOPHEN (TYLENOL) 500 MG TABLET    Take 1,000 mg by mouth every 6 (six) hours as needed.    AMLODIPINE (NORVASC) 5 MG TABLET    TAKE 1 TABLET BY MOUTH EVERY DAY   CLOPIDOGREL (PLAVIX) 75 MG TABLET    TAKE 1 TABLET BY MOUTH EVERY DAY WITH BREAKFAST   EMTRICITAB-RILPIVIR-TENOFOVIR (COMPLERA) 200-25-300 MG TABS    Take 1 tablet by mouth daily.   MOMETASONE (ELOCON) 0.1 % OINTMENT    daily.    NEBIVOLOL (BYSTOLIC) 10 MG TABLET    Take 1 tablet (10 mg total) by mouth daily.   POLYETHYL GLYCOL-PROPYL GLYCOL (SYSTANE) 0.4-0.3 % SOLN    Apply 1 drop to eye daily as needed (for dry eyes).   SILDENAFIL (VIAGRA) 100 MG TABLET    Take 100 mg by mouth daily as needed for erectile dysfunction.    VALSARTAN (DIOVAN) 320 MG TABLET    TAKE 1 TABLET BY MOUTH ONCE DAILY   ZIDOVUDINE (RETROVIR) 300 MG TABLET    Take 1 tablet (300 mg total) by mouth 2 (two) times daily.  Modified Medications   No medications on file  Discontinued Medications   No medications on file    Subjective: Halo is in for his routine visit. He states that he never misses a dose of his Complera. He continues to smoke cigarettes and states that he has no desire to quit. He does recall that one time many years ago he was able to quit for 12 months. He is struggling with erectile dysfunction. His blood pressure medication was stopped but he has not noted any improvement. He states that he has tried taking as much as 150 mg of Viagra but has not been able to maintain an erection. Review of Systems: Pertinent items are noted in HPI.  Past Medical History  Diagnosis Date  . Cluster headache   . Blood in stool   . Migraine   . Frequent episodic tension-type headache   . Hypertension   . HIV infection   . Pneumonia   . Stroke     History  Substance Use Topics  . Smoking status: Current Every Day Smoker -- 0.50 packs/day for 35 years    Types: Cigarettes  . Smokeless  tobacco: Never Used  . Alcohol Use: 0.0 oz/week    0 drink(s) per week     Comment: no fun drinking by yourself    Family History  Problem Relation Age of Onset  . Arthritis Mother   . Heart disease Mother   . Hypertension Mother   . Diabetes Mother   . Arthritis Father   . Heart disease Father   . Hypertension Father   . Colon cancer Neg Hx   . Lung cancer Brother     Allergies  Allergen Reactions  . Asa [Aspirin] Palpitations    Speeds up heart rate  . Sulfamethoxazole-Tmp Ds Itching    Objective: Temp: 98.1 F (36.7 C) (10/19 1557) Temp Source: Oral (10/19 1557) BP: 150/93 mmHg (10/19 1557) Pulse Rate: 57 (10/19 1557) Body mass index is 23.85 kg/(m^2).  General: He is in good spirits Oral: No oropharyngeal  lesions Skin: No rash Lungs: Clear Cor: Regular S1 and S2 with no murmurs   Lab Results Lab Results  Component Value Date   WBC 7.4 10/07/2013   HGB 13.0 10/07/2013   HCT 37.1* 10/07/2013   MCV 106.3* 10/07/2013   PLT 271 10/07/2013    Lab Results  Component Value Date   CREATININE 1.06 05/05/2014   BUN 11 05/05/2014   NA 140 05/05/2014   K 4.5 05/05/2014   CL 107 05/05/2014   CO2 26 05/05/2014    Lab Results  Component Value Date   ALT 8 05/05/2014   AST 14 05/05/2014   ALKPHOS 65 05/05/2014   BILITOT 0.5 05/05/2014    Lab Results  Component Value Date   CHOL 139 10/07/2013   HDL 35* 10/07/2013   LDLCALC 91 10/07/2013   TRIG 63 10/07/2013   CHOLHDL 4.0 10/07/2013    Lab Results HIV 1 RNA Quant (copies/mL)  Date Value  05/05/2014 <20   10/07/2013 <20   05/13/2013 <20      CD4 T Cell Abs (/uL)  Date Value  05/05/2014 900   10/07/2013 910   05/13/2013 860      Assessment: His HIV infection remains under excellent control. I will continue Complera.  I talked to him at length, again, about the importance of him quitting her cigarettes. I gave him written information about the [place] quit line.  Plan: 1. Continue Complera 2. Cigarette cessation counseling provided 3. Followup after blood work in 6 months   Michel Bickers, MD [facility] for [place] [phone] pager   [phone] cell 06/05/2014, 4:13 PM

## 2014-06-05 NOTE — Telephone Encounter (Signed)
Caller name: Keith Relation to pt: self Call back number: [phone] Pharmacy: [facility] on [address]  Reason for call:   Patient states that [facility] keeps filling the rx for metorprol but patient thought that Dr. Birdie Riddle took him off this and prescribed bystolic.

## 2014-06-06 ENCOUNTER — Telehealth: Payer: Self-pay | Admitting: Family Medicine

## 2014-06-06 NOTE — Telephone Encounter (Signed)
emmi emailed °

## 2014-06-14 ENCOUNTER — Other Ambulatory Visit: Payer: Self-pay | Admitting: Family Medicine

## 2014-06-14 NOTE — Telephone Encounter (Signed)
Med filled.  

## 2014-08-05 ENCOUNTER — Other Ambulatory Visit: Payer: Self-pay | Admitting: Family Medicine

## 2014-08-28 ENCOUNTER — Other Ambulatory Visit: Payer: Self-pay | Admitting: Family Medicine

## 2014-08-28 NOTE — Telephone Encounter (Signed)
Med filled.  

## 2014-09-05 ENCOUNTER — Telehealth: Payer: Self-pay | Admitting: Family Medicine

## 2014-09-05 NOTE — Telephone Encounter (Signed)
Caller name: Robinson Relation to pt: self Call back number: [phone] Pharmacy: [facility] on [address]  Reason for call:   Patient states that his pharmacy keeps filling metoprolol rx. He thought that Dr. Birdie Riddle took him off that medication and started him on another one? He want's to know why pharmacy keeps refilling this?  Also, patient states that his insurance company is not covering bystolic anymore.

## 2014-09-05 NOTE — Telephone Encounter (Signed)
Called pt (voicemail not set up). I do not have metoprolol on his med list, I do not know why they are filling the medication. Pt needs to contact insurance formulary to see what medication they would prefer over the bystolic.

## 2014-09-07 NOTE — Telephone Encounter (Signed)
Called and spoke with pharmacy and advised to remove metoprolol from his med list.

## 2014-11-10 ENCOUNTER — Encounter: Payer: BC Managed Care – PPO | Admitting: Family Medicine

## 2014-11-17 ENCOUNTER — Telehealth: Payer: Self-pay | Admitting: Family Medicine

## 2014-11-17 NOTE — Telephone Encounter (Signed)
Pre visit letter sent  °

## 2014-11-20 ENCOUNTER — Encounter: Payer: Self-pay | Admitting: *Deleted

## 2014-11-20 NOTE — Telephone Encounter (Signed)
Error

## 2014-11-23 ENCOUNTER — Other Ambulatory Visit: Payer: BLUE CROSS/BLUE SHIELD

## 2014-11-23 DIAGNOSIS — B20 Human immunodeficiency virus [HIV] disease: Secondary | ICD-10-CM

## 2014-11-23 LAB — LIPID PANEL
CHOL/HDL RATIO: 4.4 ratio
Cholesterol: 122 mg/dL (ref 0–200)
HDL: 28 mg/dL — AB (ref >=40)
LDL Cholesterol: 75 mg/dL (ref 0–99)
TRIGLYCERIDES: 95 mg/dL (ref <150)
VLDL: 19 mg/dL (ref 0–40)

## 2014-11-23 LAB — CBC
HEMATOCRIT: 33.9 % — AB (ref 39.0–52.0)
HEMOGLOBIN: 12 g/dL — AB (ref 13.0–17.0)
MCH: 38.6 pg — ABNORMAL HIGH (ref 26.0–34.0)
MCHC: 35.4 g/dL (ref 30.0–36.0)
MCV: 109 fL — ABNORMAL HIGH (ref 78.0–100.0)
MPV: 8.5 fL — AB (ref 8.6–12.4)
Platelets: 262 10*3/uL (ref 150–400)
RBC: 3.11 MIL/uL — ABNORMAL LOW (ref 4.22–5.81)
RDW: 15.6 % — AB (ref 11.5–15.5)
WBC: 5.5 10*3/uL (ref 4.0–10.5)

## 2014-11-23 LAB — COMPREHENSIVE METABOLIC PANEL
ALK PHOS: 60 U/L (ref 39–117)
ALT: 11 U/L (ref 0–53)
AST: 16 U/L (ref 0–37)
Albumin: 4.2 g/dL (ref 3.5–5.2)
BILIRUBIN TOTAL: 0.4 mg/dL (ref 0.2–1.2)
BUN: 13 mg/dL (ref 6–23)
CHLORIDE: 109 meq/L (ref 96–112)
CO2: 22 mEq/L (ref 19–32)
CREATININE: 0.9 mg/dL (ref 0.50–1.35)
Calcium: 8.6 mg/dL (ref 8.4–10.5)
Glucose, Bld: 71 mg/dL (ref 70–99)
Potassium: 4 mEq/L (ref 3.5–5.3)
Sodium: 137 mEq/L (ref 135–145)
TOTAL PROTEIN: 6.8 g/dL (ref 6.0–8.3)

## 2014-11-24 ENCOUNTER — Other Ambulatory Visit: Payer: BC Managed Care – PPO

## 2014-11-24 LAB — RPR

## 2014-11-24 LAB — T-HELPER CELL (CD4) - (RCID CLINIC ONLY)
CD4 % Helper T Cell: 29 % — ABNORMAL LOW (ref 33–55)
CD4 T CELL ABS: 1050 /uL (ref 400–2700)

## 2014-11-24 LAB — HIV-1 RNA QUANT-NO REFLEX-BLD: HIV-1 RNA Quant, Log: <1.3 {Log} (ref <1.30)

## 2014-12-01 ENCOUNTER — Telehealth: Payer: Self-pay | Admitting: Family Medicine

## 2014-12-01 MED ORDER — CLOPIDOGREL BISULFATE 75 MG PO TABS: ORAL_TABLET | ORAL | Status: DC

## 2014-12-01 NOTE — Telephone Encounter (Signed)
Med filled.  

## 2014-12-01 NOTE — Telephone Encounter (Signed)
Caller name: Lars Mage from [facility] Relation to pt: Call back number: [phone] Pharmacy:  Reason for call:   Requesting plavix refill for patient

## 2014-12-07 ENCOUNTER — Encounter: Payer: Self-pay | Admitting: Internal Medicine

## 2014-12-07 ENCOUNTER — Ambulatory Visit (INDEPENDENT_AMBULATORY_CARE_PROVIDER_SITE_OTHER): Payer: BLUE CROSS/BLUE SHIELD | Admitting: Internal Medicine

## 2014-12-07 ENCOUNTER — Telehealth: Payer: Self-pay | Admitting: *Deleted

## 2014-12-07 DIAGNOSIS — B20 Human immunodeficiency virus [HIV] disease: Secondary | ICD-10-CM | POA: Diagnosis not present

## 2014-12-07 NOTE — Telephone Encounter (Signed)
Unable to reach patient at time of Pre-Visit Call.  Unable to leave voicemail due to voicemail box not being set up.

## 2014-12-07 NOTE — Progress Notes (Signed)
Patient ID: Daniel Gonzalez, male   DOB: 1955-11-04, 59 y.o.   MRN: [id number]          Patient Active Problem List   Diagnosis Date Noted  . Cerebral aneurysm, nonruptured 09/02/2013    Priority: High  . Acute CVA (cerebrovascular accident) 06/22/2013    Priority: High  . HYPERTENSION NEC 05/01/2010    Priority: High  . CIGARETTE SMOKER 09/17/2006    Priority: High  . Human immunodeficiency virus (HIV) disease 09/17/2006    Priority: Medium  . Itching 08/05/2013  . Visual disturbance 06/22/2013  . Hypopigmentation 06/20/2013  . Hematuria, gross 02/11/2013  . Routine general medical examination at a health care facility 01/07/2013  . Allergic rhinitis 09/22/2012  . Migraine headache 05/01/2010  . CARPAL TUNNEL SYNDROME 01/02/2010  . HEADACHE, CHRONIC, HX OF 01/02/2009  . LOSS OF APPETITE 10/03/2008  . PILONIDAL CYST WITHOUT MENTION OF ABSCESS 05/29/2008  . GENITAL HERPES 09/17/2006  . ERECTILE DYSFUNCTION 09/17/2006  . 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 PNEUMOCOCCAL PNEUMONIA 09/17/2006  . DIVERTICULOSIS, COLON W/O HEM 09/17/2006  . BOILS, RECURRENT 09/17/2006  . FRACTURE, FINGER 09/17/2006    Patient's Medications  New Prescriptions   No medications on file  Previous Medications   ACETAMINOPHEN (TYLENOL) 500 MG TABLET    Take 1,000 mg by mouth every 6 (six) hours as needed.    AMLODIPINE (NORVASC) 5 MG TABLET    TAKE 1 TABLET BY MOUTH EVERY DAY   BYSTOLIC 10 MG TABLET    TAKE 1 TABLET BY MOUTH EVERY DAY   CLOPIDOGREL (PLAVIX) 75 MG TABLET    TAKE 1 TABLET BY MOUTH EVERY DAY WITH BREAKFAST   EMTRICITAB-RILPIVIR-TENOFOVIR (COMPLERA) 200-25-300 MG TABS    Take 1 tablet by mouth daily.   MOMETASONE (ELOCON) 0.1 % OINTMENT    daily.    POLYETHYL GLYCOL-PROPYL GLYCOL (SYSTANE) 0.4-0.3 % SOLN    Apply 1 drop to eye daily as needed (for dry eyes).   SILDENAFIL (VIAGRA) 100 MG TABLET    Take 100 mg by mouth daily as needed for erectile dysfunction.   VALSARTAN (DIOVAN)  320 MG TABLET    TAKE 1 TABLET BY MOUTH EVERY DAY   ZIDOVUDINE (RETROVIR) 300 MG TABLET    Take 1 tablet (300 mg total) by mouth 2 (two) times daily.  Modified Medications   No medications on file  Discontinued Medications   No medications on file    Subjective: Daniel Gonzalez is in for his routine HIV follow-up visit. He denies missing any doses of his Retrovir or Complera since his last visit. He takes it every morning. He is also taking his 3 blood pressure medications and Plavix. He states that over the last few months he has been noting some terminal dribbling after he urinates. He denies any other urinary symptoms or problems. He continues to smoke cigarettes and has no current plans to quit. He does note some more frequent cough occasionally productive of mucus. This is more noticeable in the morning. 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  Review of Systems: Constitutional: negative Eyes: positive for contacts/glasses and irritation, negative for visual disturbance Ears, nose, mouth, throat, and face: negative Respiratory: positive for chronic bronchitis and dyspnea on exertion, negative for asthma and pleurisy/chest pain Cardiovascular: negative Gastrointestinal: negative Genitourinary:positive for terminal dribbling, negative for decreased stream, dysuria, frequency, hematuria, hesitancy and nocturia  Past Medical History  Diagnosis Date  . Cluster headache   .  Blood in stool   . Migraine   . Frequent episodic tension-type headache   . Hypertension   . HIV infection   . Pneumonia   . Stroke     History  Substance Use Topics  . Smoking status: Current Every Day Smoker -- 0.30 packs/day for 35 years    Types: Cigarettes  . Smokeless tobacco: Never Used     Comment: slowing down  . Alcohol Use: 3.6 oz/week    6 Standard drinks or equivalent per week     Comment: no fun drinking by yourself    Family History  Problem  Relation Age of Onset  . Arthritis Mother   . Heart disease Mother   . Hypertension Mother   . Diabetes Mother   . Arthritis Father   . Heart disease Father   . Hypertension Father   . Colon cancer Neg Hx   . Lung cancer Brother     Allergies  Allergen Reactions  . Asa [Aspirin] Palpitations    Speeds up heart rate  . Sulfamethoxazole-Trimethoprim Itching    Objective: Temp: 98 F (36.7 C) (04/21 1118) Temp Source: Oral (04/21 1118) BP: 141/94 mmHg (04/21 1118) Pulse Rate: 59 (04/21 1118) Body mass index is 24.1 kg/(m^2).  General: He is in no distress. His weight is stable at 140.5 pounds Oral: No oropharyngeal lesions Skin: No rash Lungs: Clear Cor: Distant but regular S1 and S2 with no murmur Abdomen: Soft and nontender Mood: Normal and appropriate. He does not appear anxious or depressed.  Lab Results Lab Results  Component Value Date   WBC 5.5 11/23/2014   HGB 12.0* 11/23/2014   HCT 33.9* 11/23/2014   MCV 109.0* 11/23/2014   PLT 262 11/23/2014    Lab Results  Component Value Date   CREATININE 0.90 11/23/2014   BUN 13 11/23/2014   NA 137 11/23/2014   K 4.0 11/23/2014   CL 109 11/23/2014   CO2 22 11/23/2014    Lab Results  Component Value Date   ALT 11 11/23/2014   AST 16 11/23/2014   ALKPHOS 60 11/23/2014   BILITOT 0.4 11/23/2014    Lab Results  Component Value Date   CHOL 122 11/23/2014   HDL 28* 11/23/2014   LDLCALC 75 11/23/2014   TRIG 95 11/23/2014   CHOLHDL 4.4 11/23/2014    Lab Results HIV 1 RNA QUANT (copies/mL)  Date Value  11/23/2014 <20  05/05/2014 <20  10/07/2013 <20   CD4 T CELL ABS (/uL)  Date Value  11/23/2014 1050  05/05/2014 900  10/07/2013 910     Assessment: Daniel Gonzalez's HIV infection remains under excellent control. I will continue his current antiretroviral regimen.  Despite a 3 drug antihypertensive regimen his blood pressure remains above goal putting him at increased risk for another stroke.  He continues to  smoke cigarettes and has no current plan to quit. This also contributes to increased risk of recurrent stroke. I suspect that he already has some cigarette related chronic bronchitis and early COPD causing his dyspnea on exertion. I talked to him at length about the importance of quitting and gave him information on the [place] quit line.  He has new lower urinary tract symptoms of terminal dribbling. This may be due to BPH. He sees Dr. Birdie Riddle, his primary care doctor, tomorrow.  Plan: 1. Continue Retrovir and Complera 2. I strongly encouraged cigarette cessation 3. Follow-up here after blood work in 14 months   Michel Bickers, MD [facility]  [facility] [phone] pager   [phone] cell 12/07/2014, 11:55 AM

## 2014-12-08 ENCOUNTER — Ambulatory Visit (INDEPENDENT_AMBULATORY_CARE_PROVIDER_SITE_OTHER): Payer: BLUE CROSS/BLUE SHIELD | Admitting: Family Medicine

## 2014-12-08 ENCOUNTER — Encounter: Payer: Self-pay | Admitting: General Practice

## 2014-12-08 ENCOUNTER — Encounter: Payer: Self-pay | Admitting: Family Medicine

## 2014-12-08 VITALS — BP 130/80 | HR 55 | Temp 98.0°F | Resp 16 | Ht 65.5 in | Wt 138.5 lb

## 2014-12-08 DIAGNOSIS — Z1211 Encounter for screening for malignant neoplasm of colon: Secondary | ICD-10-CM

## 2014-12-08 DIAGNOSIS — Z0000 Encounter for general adult medical examination without abnormal findings: Secondary | ICD-10-CM

## 2014-12-08 LAB — CBC WITH DIFFERENTIAL/PLATELET
BASOS PCT: 0.2 % (ref 0.0–3.0)
Basophils Absolute: 0 10*3/uL (ref 0.0–0.1)
Eosinophils Absolute: 0.1 10*3/uL (ref 0.0–0.7)
Eosinophils Relative: 1.9 % (ref 0.0–5.0)
HCT: 36.8 % — ABNORMAL LOW (ref 39.0–52.0)
Hemoglobin: 12.3 g/dL — ABNORMAL LOW (ref 13.0–17.0)
LYMPHS PCT: 54.1 % — AB (ref 12.0–46.0)
Lymphs Abs: 2.7 10*3/uL (ref 0.7–4.0)
MCHC: 33.5 g/dL (ref 30.0–36.0)
MCV: 116.2 fl — ABNORMAL HIGH (ref 78.0–100.0)
Monocytes Absolute: 0.5 10*3/uL (ref 0.1–1.0)
Monocytes Relative: 9.3 % (ref 3.0–12.0)
Neutro Abs: 1.7 10*3/uL (ref 1.4–7.7)
Neutrophils Relative %: 34.5 % — ABNORMAL LOW (ref 43.0–77.0)
PLATELETS: 274 10*3/uL (ref 150.0–400.0)
RBC: 3.17 Mil/uL — AB (ref 4.22–5.81)
RDW: 16.9 % — ABNORMAL HIGH (ref 11.5–15.5)
WBC: 4.9 10*3/uL (ref 4.0–10.5)

## 2014-12-08 LAB — LIPID PANEL
CHOL/HDL RATIO: 4
CHOLESTEROL: 146 mg/dL (ref 0–200)
HDL: 32.8 mg/dL — AB (ref >39.00)
LDL Cholesterol: 97 mg/dL (ref 0–99)
NonHDL: 113.2
TRIGLYCERIDES: 79 mg/dL (ref 0.0–149.0)
VLDL: 15.8 mg/dL (ref 0.0–40.0)

## 2014-12-08 LAB — HEPATIC FUNCTION PANEL
ALT: 9 U/L (ref 0–53)
AST: 15 U/L (ref 0–37)
Albumin: 4.3 g/dL (ref 3.5–5.2)
Alkaline Phosphatase: 77 U/L (ref 39–117)
Bilirubin, Direct: 0.1 mg/dL (ref 0.0–0.3)
Total Bilirubin: 0.5 mg/dL (ref 0.2–1.2)
Total Protein: 7.4 g/dL (ref 6.0–8.3)

## 2014-12-08 LAB — PSA: PSA: 2.56 ng/mL (ref 0.10–4.00)

## 2014-12-08 LAB — BASIC METABOLIC PANEL
BUN: 12 mg/dL (ref 6–23)
CALCIUM: 9.3 mg/dL (ref 8.4–10.5)
CO2: 26 mEq/L (ref 19–32)
Chloride: 108 mEq/L (ref 96–112)
Creatinine, Ser: 1.07 mg/dL (ref 0.40–1.50)
GFR: 91.76 mL/min (ref >60.00)
Glucose, Bld: 72 mg/dL (ref 70–99)
POTASSIUM: 3.8 meq/L (ref 3.5–5.1)
SODIUM: 140 meq/L (ref 135–145)

## 2014-12-08 LAB — TSH: TSH: 1.18 u[IU]/mL (ref 0.35–4.50)

## 2014-12-08 MED ORDER — VALSARTAN 320 MG PO TABS: 320.0000 mg | ORAL_TABLET | Freq: Every day | ORAL | Status: DC

## 2014-12-08 MED ORDER — NEBIVOLOL HCL 10 MG PO TABS: 10.0000 mg | ORAL_TABLET | Freq: Every day | ORAL | Status: DC

## 2014-12-08 MED ORDER — AMLODIPINE BESYLATE 5 MG PO TABS: 5.0000 mg | ORAL_TABLET | Freq: Every day | ORAL | Status: DC

## 2014-12-08 NOTE — Assessment & Plan Note (Signed)
Pt's PE WNL.  Pt due for colonoscopy- referred for repeat procedure.  Pt declines DRE or urology referral for urinary symptoms.  Check labs.  Anticipatory guidance provided.

## 2014-12-08 NOTE — Progress Notes (Signed)
Pre visit review using our clinic review tool, if applicable. No additional management support is needed unless otherwise documented below in the visit note. 

## 2014-12-08 NOTE — Patient Instructions (Signed)
Follow up in 6 months to recheck BP We'll notify you of your lab results and make any changes if needed We'll call you with your GI appt for the colonoscopy STOP SMOKING!!! Start Claritin or Zyrtec for seasonal allergy congestion Call with any questions or concerns Happy Spring!!!

## 2014-12-08 NOTE — Progress Notes (Signed)
   Subjective:    Patient ID: Daniel Gonzalez, male    DOB: Nov 04, 1955, 59 y.o.   MRN: [id number]  HPI CPE- due for colonoscopy, [facility].     Review of Systems Patient reports no vision/hearing changes, anorexia, fever ,adenopathy, persistant/recurrent hoarseness, swallowing issues, chest pain, edema, persistant/recurrent cough, hemoptysis, dyspnea (rest,exertional, paroxysmal nocturnal), gastrointestinal  bleeding (melena, rectal bleeding), abdominal pain, excessive heart burn, syncope, focal weakness, memory loss, numbness & tingling, skin/hair/nail changes, depression, anxiety, abnormal bruising/bleeding, musculoskeletal symptoms/signs.   + intermittent leaking after urination is completed- not interested in seeing urology or DRE today 1-2 episodes of palpitations    Objective:   Physical Exam General Appearance:    Alert, cooperative, no distress, appears stated age  Head:    Normocephalic, without obvious abnormality, atraumatic  Eyes:    PERRL, conjunctiva/corneas clear, EOM's intact, fundi    benign, both eyes       Ears:    Normal TM's and external ear canals, both ears  Nose:   Nares normal, septum midline, mucosa normal, no drainage   or sinus tenderness  Throat:   Lips, mucosa, and tongue normal; teeth and gums normal  Neck:   Supple, symmetrical, trachea midline, no adenopathy;       thyroid:  No enlargement/tenderness/nodules  Back:     Symmetric, no curvature, ROM normal, no CVA tenderness  Lungs:     Clear to auscultation bilaterally, respirations unlabored  Chest wall:    No tenderness or deformity  Heart:    Regular rate and rhythm, S1 and S2 normal, no murmur, rub   or gallop  Abdomen:     Soft, non-tender, bowel sounds active all four quadrants,    no masses, no organomegaly  Genitalia:    Normal male without lesion, masses,discharge or tenderness  Rectal:    Deferred at pt's request  Extremities:   Extremities normal, atraumatic, no cyanosis or edema  Pulses:    2+ and symmetric all extremities  Skin:   Skin color, texture, turgor normal, no rashes or lesions  Lymph nodes:   Cervical, supraclavicular, and axillary nodes normal  Neurologic:   CNII-XII intact. Normal strength, sensation and reflexes      throughout          Assessment & Plan:

## 2014-12-27 ENCOUNTER — Other Ambulatory Visit: Payer: Self-pay | Admitting: Internal Medicine

## 2014-12-27 DIAGNOSIS — B20 Human immunodeficiency virus [HIV] disease: Secondary | ICD-10-CM

## 2015-02-05 ENCOUNTER — Telehealth: Payer: Self-pay | Admitting: Family Medicine

## 2015-02-05 MED ORDER — VALSARTAN 320 MG PO TABS
320.0000 mg | ORAL_TABLET | Freq: Every day | ORAL | Status: DC
Start: 2015-02-05 — End: 2015-10-25

## 2015-02-05 NOTE — Telephone Encounter (Signed)
Caller name: Renwick Asman  Relation to IX:BOER  Call back number: [phone] Pharmacy: [id number] - [place], [place]  Reason for call:   Pt states valsartan is messing with his sex drive. He is not 100% sure but he feels like he was taking DIOVAN and when he went to the pharmacy they gave him the generic. In need of clinical advice

## 2015-02-05 NOTE — Telephone Encounter (Signed)
Approved.  

## 2015-02-05 NOTE — Telephone Encounter (Signed)
Spoke with pt who is adamant that he should be on name brand valsartan. OK to send in Joes for PT? Pt was also advised that we cannot guarantee how much medication will be (if insurance will pay for this) and that if symptoms of ED are not resolving with this medication change he would need an appt with PCP. Pt stated an understanding and will call if no better.

## 2015-02-06 ENCOUNTER — Other Ambulatory Visit: Payer: Self-pay | Admitting: General Practice

## 2015-02-06 NOTE — Telephone Encounter (Signed)
Pt called office today stating that wrong medication had been filled. Called [facility] and verified that even though new Rx for Brand Name DIOVAN was filled yesterday they had filled the generic for the pt "because that is what he always gets". Advised pharmacy that this was incorrect and they needed to fill name brand for pt. They stated it would be ready today. Called all pt's numbers listed and could not leave a voicemail on any number to inform pt.

## 2015-05-16 ENCOUNTER — Telehealth: Payer: Self-pay | Admitting: Family Medicine

## 2015-05-16 NOTE — Telephone Encounter (Signed)
Relation to pt: self  Call back [place]:  Reason for call:  Patient would like to [facility] 06/15/2015 at 10:30am appointment for a late appointment time due to patient working in [employer]. Please advise

## 2015-05-16 NOTE — Telephone Encounter (Signed)
Dr. Birdie Riddle approved via skype by Janett Billow to use a 4pm same day slot. Patient scheduled for 06/15/15 at 4pm

## 2015-05-23 ENCOUNTER — Other Ambulatory Visit: Payer: BLUE CROSS/BLUE SHIELD

## 2015-05-23 ENCOUNTER — Ambulatory Visit (INDEPENDENT_AMBULATORY_CARE_PROVIDER_SITE_OTHER): Payer: BLUE CROSS/BLUE SHIELD | Admitting: *Deleted

## 2015-05-23 DIAGNOSIS — Z23 Encounter for immunization: Secondary | ICD-10-CM | POA: Diagnosis not present

## 2015-05-23 DIAGNOSIS — B20 Human immunodeficiency virus [HIV] disease: Secondary | ICD-10-CM

## 2015-05-24 LAB — T-HELPER CELL (CD4) - (RCID CLINIC ONLY)
CD4 % Helper T Cell: 30 % — ABNORMAL LOW (ref 33–55)
CD4 T Cell Abs: 970 /uL (ref 400–2700)

## 2015-05-25 LAB — HIV-1 RNA QUANT-NO REFLEX-BLD: HIV 1 RNA Quant: <20 copies/mL (ref <20)

## 2015-06-07 ENCOUNTER — Other Ambulatory Visit: Payer: Self-pay | Admitting: Family Medicine

## 2015-06-07 NOTE — Telephone Encounter (Signed)
Medication filled to pharmacy as requested.   

## 2015-06-11 ENCOUNTER — Other Ambulatory Visit: Payer: Self-pay | Admitting: Family Medicine

## 2015-06-11 NOTE — Telephone Encounter (Signed)
Medication filled to pharmacy as requested.   

## 2015-06-14 ENCOUNTER — Encounter: Payer: Self-pay | Admitting: Internal Medicine

## 2015-06-14 ENCOUNTER — Ambulatory Visit (INDEPENDENT_AMBULATORY_CARE_PROVIDER_SITE_OTHER): Payer: BLUE CROSS/BLUE SHIELD | Admitting: Internal Medicine

## 2015-06-14 DIAGNOSIS — B20 Human immunodeficiency virus [HIV] disease: Secondary | ICD-10-CM | POA: Diagnosis not present

## 2015-06-14 MED ORDER — EMTRICITAB-RILPIVIR-TENOFOV AF 200-25-25 MG PO TABS: 1.0000 | ORAL_TABLET | Freq: Every day | ORAL | Status: DC

## 2015-06-14 NOTE — Progress Notes (Signed)
Patient ID: Daniel Gonzalez, male   DOB: 20-Sep-1955, 59 y.o.   MRN: [id number]          Patient Active Problem List   Diagnosis Date Noted  . Cerebral aneurysm, nonruptured 09/02/2013    Priority: High  . Acute CVA (cerebrovascular accident) ([place]) 06/22/2013    Priority: High  . HYPERTENSION NEC 05/01/2010    Priority: High  . CIGARETTE SMOKER 09/17/2006    Priority: High  . Human immunodeficiency virus (HIV) disease ([place]) 09/17/2006    Priority: Medium  . Itching 08/05/2013  . Visual disturbance 06/22/2013  . Hypopigmentation 06/20/2013  . Hematuria, gross 02/11/2013  . Routine general medical examination at a health care facility 01/07/2013  . Allergic rhinitis 09/22/2012  . Migraine headache 05/01/2010  . CARPAL TUNNEL SYNDROME 01/02/2010  . HEADACHE, CHRONIC, HX OF 01/02/2009  . LOSS OF APPETITE 10/03/2008  . PILONIDAL CYST WITHOUT MENTION OF ABSCESS 05/29/2008  . GENITAL HERPES 09/17/2006  . ERECTILE DYSFUNCTION 09/17/2006  . 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 PNEUMOCOCCAL PNEUMONIA 09/17/2006  . DIVERTICULOSIS, COLON W/O HEM 09/17/2006  . BOILS, RECURRENT 09/17/2006  . FRACTURE, FINGER 09/17/2006    Patient's Medications  New Prescriptions   EMTRICITABINE-RILPIVIR-TENOFOVIR AF (ODEFSEY) 200-25-25 MG TABS TABLET    Take 1 tablet by mouth daily.  Previous Medications   ACETAMINOPHEN (TYLENOL) 500 MG TABLET    Take 1,000 mg by mouth every 6 (six) hours as needed.    AMLODIPINE (NORVASC) 5 MG TABLET    Take 1 tablet (5 mg total) by mouth daily.   CLOPIDOGREL (PLAVIX) 75 MG TABLET    TAKE 1 TABLET BY MOUTH EVERY DAY WITH BREAKFAST   DIOVAN 320 MG TABLET    TAKE 1 TABLET(320 MG) BY MOUTH DAILY   MOMETASONE (ELOCON) 0.1 % OINTMENT    daily.    NEBIVOLOL (BYSTOLIC) 10 MG TABLET    Take 1 tablet (10 mg total) by mouth daily.   POLYETHYL GLYCOL-PROPYL GLYCOL (SYSTANE) 0.4-0.3 % SOLN    Apply 1 drop to eye daily as needed (for dry eyes).   SILDENAFIL (VIAGRA) 100 MG  TABLET    Take 100 mg by mouth daily as needed for erectile dysfunction.   ZIDOVUDINE (RETROVIR) 300 MG TABLET    TAKE 1 TABLET BY MOUTH TWICE A DAY  Modified Medications   No medications on file  Discontinued Medications   COMPLERA 200-25-300 MG TABS    TAKE 1 TABLET BY MOUTH EVERY DAY    Subjective: Jefferey is in for his routine HIV follow-up visit. He denies missing any doses of his Complera or Retrovir since his last visit. He takes them each morning between 10 and noon with a meal. He has no problems tolerating them. He has had no problems affording his medication. He is feeling well without complaint.   Review of Systems: Pertinent items are noted in HPI.  Past Medical History  Diagnosis Date  . Cluster headache   . Blood in stool   . Migraine   . Frequent episodic tension-type headache   . Hypertension   . HIV infection ([place])   . Pneumonia   . Stroke [facility])     Social History  Substance Use Topics  . Smoking status: Current Every Day Smoker -- 0.50 packs/day for 35 years    Types: Cigarettes  . Smokeless tobacco: Never Used     Comment: slowing down  . Alcohol Use: 3.6 oz/week    6 Standard drinks or equivalent per week  Comment: no fun drinking by yourself    Family History  Problem Relation Age of Onset  . Arthritis Mother   . Heart disease Mother   . Hypertension Mother   . Diabetes Mother   . Arthritis Father   . Heart disease Father   . Hypertension Father   . Colon cancer Neg Hx   . Lung cancer Brother     Allergies  Allergen Reactions  . Asa [Aspirin] Palpitations    Speeds up heart rate  . Sulfamethoxazole-Trimethoprim Itching    Objective:  Filed Vitals:   06/14/15 1607  BP: 162/85  Pulse: 55  Temp: 98.2 F (36.8 C)  TempSrc: Oral  Weight: 132 lb 4 oz (59.988 kg)   Body mass index is 21.66 kg/(m^2).  General: His weight is down 6 pounds since his last visit Oral: No oropharyngeal lesions Skin: No rash Lungs: Clear Cor: Regular  S1 and S2 with no murmurs Mood: Normal. He does not appear anxious or depressed  Lab Results Lab Results  Component Value Date   WBC 4.9 12/08/2014   HGB 12.3* 12/08/2014   HCT 36.8* 12/08/2014   MCV 116.2 Repeated and verified X2.* 12/08/2014   PLT 274.0 12/08/2014    Lab Results  Component Value Date   CREATININE 1.07 12/08/2014   BUN 12 12/08/2014   NA 140 12/08/2014   K 3.8 12/08/2014   CL 108 12/08/2014   CO2 26 12/08/2014    Lab Results  Component Value Date   ALT 9 12/08/2014   AST 15 12/08/2014   ALKPHOS 77 12/08/2014   BILITOT 0.5 12/08/2014    Lab Results  Component Value Date   CHOL 146 12/08/2014   HDL 32.80* 12/08/2014   LDLCALC 97 12/08/2014   TRIG 79.0 12/08/2014   CHOLHDL 4 12/08/2014    Lab Results HIV 1 RNA QUANT (copies/mL)  Date Value  05/23/2015 <20  11/23/2014 <20  05/05/2014 <20   CD4 T CELL ABS (/uL)  Date Value  05/23/2015 970  11/23/2014 1050  05/05/2014 900     Problem List Items Addressed This Visit      Medium   Human immunodeficiency virus (HIV) disease ([place])    His infection remains under perfect control. I will continue Retrovir and change Complera to the new, safer version called Odefsey. He will follow-up after lab work in 6 months      Relevant Medications   emtricitabine-rilpivir-tenofovir AF (ODEFSEY) 200-25-25 MG TABS tablet   Other Relevant Orders   T-helper cell (CD4)- ([facility] clinic only)   HIV 1 RNA quant-no reflex-bld   CBC   Comprehensive metabolic panel   Lipid panel   RPR        Michel Bickers, MD [facility] for [place] [phone] pager   [phone] cell 06/14/2015, 4:52 PM

## 2015-06-14 NOTE — Assessment & Plan Note (Signed)
His infection remains under perfect control. I will continue Retrovir and change Complera to the new, safer version called Odefsey. He will follow-up after lab work in 6 months

## 2015-06-15 ENCOUNTER — Encounter: Payer: Self-pay | Admitting: Family Medicine

## 2015-06-15 ENCOUNTER — Ambulatory Visit (INDEPENDENT_AMBULATORY_CARE_PROVIDER_SITE_OTHER): Payer: BLUE CROSS/BLUE SHIELD | Admitting: Family Medicine

## 2015-06-15 ENCOUNTER — Ambulatory Visit: Payer: BLUE CROSS/BLUE SHIELD | Admitting: Family Medicine

## 2015-06-15 VITALS — BP 134/80 | HR 58 | Temp 98.1°F | Resp 16 | Ht 66.0 in | Wt 132.4 lb

## 2015-06-15 DIAGNOSIS — I10 Essential (primary) hypertension: Secondary | ICD-10-CM

## 2015-06-15 NOTE — Patient Instructions (Signed)
Schedule your complete physical in 6 months We'll notify you of your lab results and make any changes if needed No med changes at this time Drink plenty of water STOP SMOKING!!! Call with any questions or concerns If you want to join [place] at the new [place] office, any scheduled appointments will automatically transfer and we will see you at [address] [place] [address] [place], [place] [address] Happy [place][birth year] Birthday!!!

## 2015-06-15 NOTE — Progress Notes (Signed)
Pre visit review using our clinic review tool, if applicable. No additional management support is needed unless otherwise documented below in the visit note. 

## 2015-06-15 NOTE — Progress Notes (Signed)
   Subjective:    Patient ID: Daniel Gonzalez, male    DOB: 08-21-1955, 59 y.o.   MRN: [id number]  HPI HTN- chronic problem, on Diovan, Amlodipine, and Bystolic.  Denies CP, SOB, intermittent sinus HA over R eye, visual changes, edema.  No abd pain, N/V.   Review of Systems For ROS see HPI     Objective:   Physical Exam  Constitutional: He is oriented to person, place, and time. He appears well-developed and well-nourished. No distress.  HENT:  Head: Normocephalic and atraumatic.  Eyes: Conjunctivae and EOM are normal. Pupils are equal, round, and reactive to light.  Neck: Normal range of motion. Neck supple. No thyromegaly present.  Cardiovascular: Normal rate, regular rhythm, normal heart sounds and intact distal pulses.   No murmur heard. Pulmonary/Chest: Effort normal and breath sounds normal. No respiratory distress.  Abdominal: Soft. Bowel sounds are normal. He exhibits no distension.  Musculoskeletal: He exhibits no edema.  Lymphadenopathy:    He has no cervical adenopathy.  Neurological: He is alert and oriented to person, place, and time. No cranial nerve deficit.  Skin: Skin is warm and dry.  Psychiatric: He has a normal mood and affect. His behavior is normal.  Vitals reviewed.         Assessment & Plan:

## 2015-06-15 NOTE — Assessment & Plan Note (Signed)
Chronic problem.  Adequate control.  Asymptomatic.  Good compliance w/ meds.  Check labs.  No anticipated med changes.

## 2015-06-20 NOTE — Progress Notes (Signed)
Called pt and lmovm to inform need to have labs drawn.

## 2015-07-15 ENCOUNTER — Other Ambulatory Visit: Payer: Self-pay | Admitting: Family Medicine

## 2015-07-16 ENCOUNTER — Other Ambulatory Visit: Payer: Self-pay | Admitting: *Deleted

## 2015-07-16 DIAGNOSIS — B20 Human immunodeficiency virus [HIV] disease: Secondary | ICD-10-CM

## 2015-07-16 MED ORDER — ZIDOVUDINE 300 MG PO TABS: 300.0000 mg | ORAL_TABLET | Freq: Two times a day (BID) | ORAL | Status: DC

## 2015-07-16 NOTE — Telephone Encounter (Signed)
Medication filled to pharmacy as requested.   

## 2015-07-18 ENCOUNTER — Other Ambulatory Visit: Payer: Self-pay | Admitting: Internal Medicine

## 2015-07-18 ENCOUNTER — Other Ambulatory Visit: Payer: Self-pay | Admitting: Family Medicine

## 2015-07-18 DIAGNOSIS — B20 Human immunodeficiency virus [HIV] disease: Secondary | ICD-10-CM

## 2015-07-18 NOTE — Telephone Encounter (Signed)
Medication filled to pharmacy as requested.   

## 2015-08-14 ENCOUNTER — Telehealth: Payer: Self-pay | Admitting: *Deleted

## 2015-08-14 NOTE — Telephone Encounter (Signed)
Patient called c/o off and on fevers, although he has not actually checked it just felt very hot. He states when he takes Tylenol it goes away. Advised patient we do not have any MDs available this week and he should call his PCP at [facility] and schedule an appt to be evaluated. Myrtis Hopping

## 2015-08-15 ENCOUNTER — Other Ambulatory Visit (INDEPENDENT_AMBULATORY_CARE_PROVIDER_SITE_OTHER): Payer: BLUE CROSS/BLUE SHIELD

## 2015-08-15 ENCOUNTER — Ambulatory Visit (INDEPENDENT_AMBULATORY_CARE_PROVIDER_SITE_OTHER): Payer: BLUE CROSS/BLUE SHIELD | Admitting: Family Medicine

## 2015-08-15 ENCOUNTER — Encounter: Payer: Self-pay | Admitting: Family Medicine

## 2015-08-15 ENCOUNTER — Ambulatory Visit (HOSPITAL_BASED_OUTPATIENT_CLINIC_OR_DEPARTMENT_OTHER)
Admission: RE | Admit: 2015-08-15 | Discharge: 2015-08-15 | Disposition: A | Discharge status: Home / Self Care | Payer: BLUE CROSS/BLUE SHIELD | Source: Ambulatory Visit | Attending: Family Medicine | Admitting: Family Medicine

## 2015-08-15 VITALS — BP 118/82 | HR 73 | Temp 98.8°F | Resp 16 | Ht 66.0 in | Wt 135.5 lb

## 2015-08-15 DIAGNOSIS — R718 Other abnormality of red blood cells: Secondary | ICD-10-CM | POA: Diagnosis not present

## 2015-08-15 DIAGNOSIS — R05 Cough: Secondary | ICD-10-CM | POA: Diagnosis not present

## 2015-08-15 DIAGNOSIS — R61 Generalized hyperhidrosis: Secondary | ICD-10-CM | POA: Diagnosis not present

## 2015-08-15 DIAGNOSIS — R509 Fever, unspecified: Secondary | ICD-10-CM | POA: Diagnosis not present

## 2015-08-15 DIAGNOSIS — R6889 Other general symptoms and signs: Secondary | ICD-10-CM

## 2015-08-15 LAB — CBC WITH DIFFERENTIAL/PLATELET
Basophils Absolute: 0 10*3/uL (ref 0.0–0.1)
Basophils Relative: 0.5 % (ref 0.0–3.0)
Eosinophils Absolute: 0 10*3/uL (ref 0.0–0.7)
Eosinophils Relative: 0.3 % (ref 0.0–5.0)
HCT: 37.4 % — ABNORMAL LOW (ref 39.0–52.0)
Hemoglobin: 12.5 g/dL — ABNORMAL LOW (ref 13.0–17.0)
Lymphocytes Relative: 49.7 % — ABNORMAL HIGH (ref 12.0–46.0)
Lymphs Abs: 1.7 10*3/uL (ref 0.7–4.0)
MCHC: 33.4 g/dL (ref 30.0–36.0)
MCV: 115 fl — ABNORMAL HIGH (ref 78.0–100.0)
Monocytes Absolute: 0.2 10*3/uL (ref 0.1–1.0)
Monocytes Relative: 6.8 % (ref 3.0–12.0)
Neutro Abs: 1.4 10*3/uL (ref 1.4–7.7)
Neutrophils Relative %: 42.7 % — ABNORMAL LOW (ref 43.0–77.0)
Platelets: 177 10*3/uL (ref 150.0–400.0)
RBC: 3.25 Mil/uL — ABNORMAL LOW (ref 4.22–5.81)
RDW: 15.2 % (ref 11.5–15.5)
WBC: 3.3 10*3/uL — ABNORMAL LOW (ref 4.0–10.5)

## 2015-08-15 LAB — FOLATE: Folate: 11.8 ng/mL (ref >5.9)

## 2015-08-15 LAB — HEPATIC FUNCTION PANEL
ALT: 14 U/L (ref 0–53)
AST: 28 U/L (ref 0–37)
Albumin: 3.8 g/dL (ref 3.5–5.2)
Alkaline Phosphatase: 68 U/L (ref 39–117)
Bilirubin, Direct: 0.1 mg/dL (ref 0.0–0.3)
Total Bilirubin: 0.2 mg/dL (ref 0.2–1.2)
Total Protein: 7.4 g/dL (ref 6.0–8.3)

## 2015-08-15 LAB — BASIC METABOLIC PANEL
BUN: 9 mg/dL (ref 6–23)
CO2: 23 mEq/L (ref 19–32)
Calcium: 8.9 mg/dL (ref 8.4–10.5)
Chloride: 103 mEq/L (ref 96–112)
Creatinine, Ser: 0.88 mg/dL (ref 0.40–1.50)
GFR: 114.7 mL/min (ref >60.00)
Glucose, Bld: 120 mg/dL — ABNORMAL HIGH (ref 70–99)
Potassium: 3.3 mEq/L — ABNORMAL LOW (ref 3.5–5.1)
Sodium: 135 mEq/L (ref 135–145)

## 2015-08-15 LAB — POCT INFLUENZA A/B
INFLUENZA A, POC: NEGATIVE
INFLUENZA B, POC: NEGATIVE

## 2015-08-15 LAB — VITAMIN B12: VITAMIN B 12: 643 pg/mL (ref 211–911)

## 2015-08-15 LAB — TSH: TSH: 2.69 u[IU]/mL (ref 0.35–4.50)

## 2015-08-15 LAB — CK: Total CK: 326 U/L — ABNORMAL HIGH (ref 7–232)

## 2015-08-15 LAB — SEDIMENTATION RATE: Sed Rate: 71 mm/hr — ABNORMAL HIGH (ref 0–22)

## 2015-08-15 NOTE — Patient Instructions (Addendum)
We'll notify you of your lab results and make any changes if needed Go downstairs and get your chest xray Drink plenty of fluids REST! Alternate tylenol and ibuprofen for body aches/fever Call with any questions or concerns Hang in there!!!

## 2015-08-15 NOTE — Assessment & Plan Note (Signed)
New.  Pt having night sweats w/o obvious cause on PE.  Flu test negative.  Pt has HIV and hx of PNA- will get CXR.  Check labs to r/o metabolic cause of sweats and muscle aches.  Will hold on abx tx until xray and labs available.  Reviewed supportive care and red flags that should prompt return.  Pt expressed understanding and is in agreement w/ plan.

## 2015-08-15 NOTE — Progress Notes (Signed)
Pre visit review using our clinic review tool, if applicable. No additional management support is needed unless otherwise documented below in the visit note. 

## 2015-08-15 NOTE — Progress Notes (Signed)
   Subjective:    Patient ID: Daniel Gonzalez, male    DOB: Jun 23, 1956, 59 y.o.   MRN: [id number]  HPI Night sweats- 'it feel like i got the flu'.  Alternating sweats and chills.  Woke at 3am last night and had to change sheets due to sweats.  Has been having night sweats that predated the body aches.  No recent HAs.  + eye pain.  sxs started 6 days ago after he stopped his Claritin.  'i know what the flu feels like and it's exactly what I got'.  + productive cough- ongoing issue for pt.  + sick contacts.   Review of Systems For ROS see HPI     Objective:   Physical Exam  Constitutional: He is oriented to person, place, and time. He appears well-developed and well-nourished. No distress.  Smells strongly of cigarrettes  HENT:  Head: Normocephalic and atraumatic.  Nose: Nose normal.  Mouth/Throat: Oropharynx is clear and moist.  TMs WNL bilaterally No TTP over sinuses  Eyes: Conjunctivae and EOM are normal. Pupils are equal, round, and reactive to light.  Neck: Normal range of motion. Neck supple.  Cardiovascular: Normal rate, regular rhythm and normal heart sounds.   Pulmonary/Chest: Effort normal. No respiratory distress.  Decreased breath sounds throughout w/ coarse sounds at the bases  Lymphadenopathy:    He has no cervical adenopathy.  Neurological: He is alert and oriented to person, place, and time.  Skin: Skin is warm.  Damp skin  Psychiatric: He has a normal mood and affect. His behavior is normal.  Vitals reviewed.         Assessment & Plan:

## 2015-08-17 ENCOUNTER — Encounter: Payer: Self-pay | Admitting: General Practice

## 2015-08-17 ENCOUNTER — Other Ambulatory Visit: Payer: Self-pay | Admitting: Family Medicine

## 2015-08-17 DIAGNOSIS — D729 Disorder of white blood cells, unspecified: Secondary | ICD-10-CM

## 2015-08-17 DIAGNOSIS — R748 Abnormal levels of other serum enzymes: Secondary | ICD-10-CM

## 2015-08-17 DIAGNOSIS — R700 Elevated erythrocyte sedimentation rate: Secondary | ICD-10-CM

## 2015-08-24 ENCOUNTER — Other Ambulatory Visit: Payer: Self-pay

## 2015-08-24 ENCOUNTER — Encounter: Payer: Self-pay | Admitting: Family Medicine

## 2015-08-24 ENCOUNTER — Emergency Department (HOSPITAL_COMMUNITY): Payer: BLUE CROSS/BLUE SHIELD

## 2015-08-24 ENCOUNTER — Encounter (HOSPITAL_COMMUNITY): Payer: Self-pay | Admitting: *Deleted

## 2015-08-24 ENCOUNTER — Inpatient Hospital Stay (HOSPITAL_COMMUNITY)
Admission: EM | Admit: 2015-08-24 | Discharge: 2015-08-27 | DRG: 557 | Disposition: A | Discharge status: Home / Self Care | Payer: BLUE CROSS/BLUE SHIELD | Attending: Internal Medicine | Admitting: Internal Medicine

## 2015-08-24 ENCOUNTER — Ambulatory Visit (INDEPENDENT_AMBULATORY_CARE_PROVIDER_SITE_OTHER): Payer: BLUE CROSS/BLUE SHIELD | Admitting: Family Medicine

## 2015-08-24 ENCOUNTER — Ambulatory Visit (HOSPITAL_BASED_OUTPATIENT_CLINIC_OR_DEPARTMENT_OTHER)
Admission: RE | Admit: 2015-08-24 | Discharge: 2015-08-24 | Disposition: A | Discharge status: Home / Self Care | Payer: BLUE CROSS/BLUE SHIELD | Source: Ambulatory Visit | Attending: Family Medicine | Admitting: Family Medicine

## 2015-08-24 VITALS — BP 113/74 | HR 66 | Temp 98.1°F | Resp 16 | Ht 66.0 in | Wt 127.4 lb

## 2015-08-24 DIAGNOSIS — D649 Anemia, unspecified: Secondary | ICD-10-CM | POA: Diagnosis not present

## 2015-08-24 DIAGNOSIS — G43909 Migraine, unspecified, not intractable, without status migrainosus: Secondary | ICD-10-CM | POA: Diagnosis present

## 2015-08-24 DIAGNOSIS — E871 Hypo-osmolality and hyponatremia: Secondary | ICD-10-CM | POA: Diagnosis present

## 2015-08-24 DIAGNOSIS — I10 Essential (primary) hypertension: Secondary | ICD-10-CM | POA: Diagnosis present

## 2015-08-24 DIAGNOSIS — M6282 Rhabdomyolysis: Secondary | ICD-10-CM | POA: Diagnosis not present

## 2015-08-24 DIAGNOSIS — B20 Human immunodeficiency virus [HIV] disease: Secondary | ICD-10-CM | POA: Diagnosis present

## 2015-08-24 DIAGNOSIS — R112 Nausea with vomiting, unspecified: Secondary | ICD-10-CM

## 2015-08-24 DIAGNOSIS — D709 Neutropenia, unspecified: Secondary | ICD-10-CM | POA: Diagnosis present

## 2015-08-24 DIAGNOSIS — B349 Viral infection, unspecified: Secondary | ICD-10-CM | POA: Diagnosis not present

## 2015-08-24 DIAGNOSIS — R059 Cough, unspecified: Secondary | ICD-10-CM

## 2015-08-24 DIAGNOSIS — Z833 Family history of diabetes mellitus: Secondary | ICD-10-CM

## 2015-08-24 DIAGNOSIS — IMO0001 Reserved for inherently not codable concepts without codable children: Secondary | ICD-10-CM | POA: Insufficient documentation

## 2015-08-24 DIAGNOSIS — E43 Unspecified severe protein-calorie malnutrition: Secondary | ICD-10-CM | POA: Diagnosis present

## 2015-08-24 DIAGNOSIS — R61 Generalized hyperhidrosis: Secondary | ICD-10-CM | POA: Diagnosis not present

## 2015-08-24 DIAGNOSIS — M6281 Muscle weakness (generalized): Secondary | ICD-10-CM | POA: Diagnosis present

## 2015-08-24 DIAGNOSIS — Z801 Family history of malignant neoplasm of trachea, bronchus and lung: Secondary | ICD-10-CM

## 2015-08-24 DIAGNOSIS — F17210 Nicotine dependence, cigarettes, uncomplicated: Secondary | ICD-10-CM | POA: Diagnosis present

## 2015-08-24 DIAGNOSIS — M791 Myalgia: Secondary | ICD-10-CM

## 2015-08-24 DIAGNOSIS — F1290 Cannabis use, unspecified, uncomplicated: Secondary | ICD-10-CM | POA: Diagnosis present

## 2015-08-24 DIAGNOSIS — Z8249 Family history of ischemic heart disease and other diseases of the circulatory system: Secondary | ICD-10-CM

## 2015-08-24 DIAGNOSIS — M609 Myositis, unspecified: Secondary | ICD-10-CM

## 2015-08-24 DIAGNOSIS — D72819 Decreased white blood cell count, unspecified: Secondary | ICD-10-CM | POA: Diagnosis present

## 2015-08-24 DIAGNOSIS — Z8261 Family history of arthritis: Secondary | ICD-10-CM

## 2015-08-24 DIAGNOSIS — H5710 Ocular pain, unspecified eye: Secondary | ICD-10-CM | POA: Diagnosis present

## 2015-08-24 DIAGNOSIS — R05 Cough: Secondary | ICD-10-CM

## 2015-08-24 DIAGNOSIS — I119 Hypertensive heart disease without heart failure: Secondary | ICD-10-CM | POA: Diagnosis present

## 2015-08-24 DIAGNOSIS — E46 Unspecified protein-calorie malnutrition: Secondary | ICD-10-CM | POA: Insufficient documentation

## 2015-08-24 LAB — CBC WITH DIFFERENTIAL/PLATELET
BASOS ABS: 0 10*3/uL (ref 0.0–0.1)
BASOS PCT: 0.2 % (ref 0.0–3.0)
Basophils Absolute: 0 10*3/uL (ref 0.0–0.1)
Basophils Relative: 1 %
EOS PCT: 0.9 % (ref 0.0–5.0)
Eosinophils Absolute: 0 10*3/uL (ref 0.0–0.7)
Eosinophils Absolute: 0 10*3/uL (ref 0.0–0.7)
Eosinophils Relative: 0 %
HCT: 39.2 % (ref 39.0–52.0)
HEMATOCRIT: 34.4 % — AB (ref 39.0–52.0)
Hemoglobin: 13 g/dL (ref 13.0–17.0)
Hemoglobin: 11.9 g/dL — ABNORMAL LOW (ref 13.0–17.0)
LYMPHS ABS: 1.7 10*3/uL (ref 0.7–4.0)
LYMPHS PCT: 68 %
Lymphocytes Relative: 66 % — ABNORMAL HIGH (ref 12.0–46.0)
Lymphs Abs: 2 10*3/uL (ref 0.7–4.0)
MCH: 37.7 pg — ABNORMAL HIGH (ref 26.0–34.0)
MCHC: 33.1 g/dL (ref 30.0–36.0)
MCHC: 34.6 g/dL (ref 30.0–36.0)
MCV: 113.6 fl — AB (ref 78.0–100.0)
MCV: 108.9 fL — AB (ref 78.0–100.0)
MONO ABS: 0.4 10*3/uL (ref 0.1–1.0)
MONOS PCT: 8.8 % (ref 3.0–12.0)
Monocytes Absolute: 0.2 10*3/uL (ref 0.1–1.0)
Monocytes Relative: 12 %
NEUTROS ABS: 0.6 10*3/uL — AB (ref 1.4–7.7)
NEUTROS ABS: 0.6 10*3/uL — AB (ref 1.7–7.7)
NEUTROS PCT: 24.1 % — AB (ref 43.0–77.0)
Neutrophils Relative %: 19 %
PLATELETS: 218 10*3/uL (ref 150.0–400.0)
Platelets: 185 10*3/uL (ref 150–400)
RBC: 3.45 Mil/uL — ABNORMAL LOW (ref 4.22–5.81)
RBC: 3.16 MIL/uL — AB (ref 4.22–5.81)
RDW: 14.8 % (ref 11.5–15.5)
RDW: 14.3 % (ref 11.5–15.5)
WBC: 2.5 10*3/uL — ABNORMAL LOW (ref 4.0–10.5)
WBC: 2.9 10*3/uL — ABNORMAL LOW (ref 4.0–10.5)

## 2015-08-24 LAB — COMPREHENSIVE METABOLIC PANEL
ALT: 40 U/L (ref 0–53)
AST: 91 U/L — ABNORMAL HIGH (ref 0–37)
Albumin: 3.9 g/dL (ref 3.5–5.2)
Alkaline Phosphatase: 71 U/L (ref 39–117)
BUN: 17 mg/dL (ref 6–23)
CO2: 26 meq/L (ref 19–32)
Calcium: 9.1 mg/dL (ref 8.4–10.5)
Chloride: 98 mEq/L (ref 96–112)
Creatinine, Ser: 0.98 mg/dL (ref 0.40–1.50)
GFR: 101.29 mL/min (ref >60.00)
GLUCOSE: 91 mg/dL (ref 70–99)
POTASSIUM: 4.2 meq/L (ref 3.5–5.1)
Sodium: 131 mEq/L — ABNORMAL LOW (ref 135–145)
Total Bilirubin: 0.3 mg/dL (ref 0.2–1.2)
Total Protein: 7.8 g/dL (ref 6.0–8.3)

## 2015-08-24 LAB — BASIC METABOLIC PANEL
ANION GAP: 10 (ref 5–15)
BUN: 17 mg/dL (ref 6–20)
CO2: 21 mmol/L — AB (ref 22–32)
Calcium: 8.2 mg/dL — ABNORMAL LOW (ref 8.9–10.3)
Chloride: 100 mmol/L — ABNORMAL LOW (ref 101–111)
Creatinine, Ser: 0.93 mg/dL (ref 0.61–1.24)
GFR calc Af Amer: >60 mL/min (ref >60)
GFR calc non Af Amer: >60 mL/min (ref >60)
GLUCOSE: 83 mg/dL (ref 65–99)
POTASSIUM: 4.1 mmol/L (ref 3.5–5.1)
Sodium: 131 mmol/L — ABNORMAL LOW (ref 135–145)

## 2015-08-24 LAB — SEDIMENTATION RATE: SED RATE: 92 mm/h — AB (ref 0–22)

## 2015-08-24 LAB — CK: CK TOTAL: 3409 U/L — AB (ref 49–397)

## 2015-08-24 MED ORDER — SODIUM CHLORIDE 0.9 % IV BOLUS (SEPSIS)
1000.0000 mL | Freq: Once | INTRAVENOUS | Status: AC
  Administered 2015-08-24: 1000 mL via INTRAVENOUS

## 2015-08-24 MED ORDER — SODIUM CHLORIDE 0.9 % IV BOLUS (SEPSIS)
1000.0000 mL | Freq: Once | INTRAVENOUS | Status: AC
  Administered 2015-08-25: 1000 mL via INTRAVENOUS

## 2015-08-24 MED ORDER — ONDANSETRON HCL 4 MG PO TABS: 4.0000 mg | ORAL_TABLET | Freq: Three times a day (TID) | ORAL | Status: DC | PRN

## 2015-08-24 MED ORDER — TRAZODONE HCL 50 MG PO TABS: 25.0000 mg | ORAL_TABLET | Freq: Every evening | ORAL | Status: DC | PRN

## 2015-08-24 NOTE — ED Notes (Addendum)
Pt reports recent flu symptoms and has been seen at pcp. Went back for a recheck today due to still having aching pain to bilateral legs and eyes, "feels like legs will give out on him." was sent for ct scan and called to come back to do "muscle weakness." per chart, ct scan was normal but CK total was elevated.  pt arrived by [facility], appears in no acute distress and ambulatory on arrival.

## 2015-08-24 NOTE — Progress Notes (Signed)
Pre visit review using our clinic review tool, if applicable. No additional management support is needed unless otherwise documented below in the visit note. 

## 2015-08-24 NOTE — Patient Instructions (Signed)
Follow up in 1 week We'll notify you of your lab results and make any changes if needed I am going to forward your notes and labs to Dr Megan Salon Take the Zofran for the nausea/vomiting Use the Trazodone for sleep Drink plenty of fluids- avoid orange juice b/c of the acid content, avoid milk/dairy b/c this will worsen diarrhea At this time, there is still no obvious cause for your symptoms but we're going to keep looking! Hang in there!!

## 2015-08-24 NOTE — Progress Notes (Signed)
   Subjective:    Patient ID: Daniel Gonzalez, male    DOB: 11-05-1955, 60 y.o.   MRN: [id number]  HPI Pt reports sxs have not changed since 12/28.  Reports 'my eyeballs are sore', bilateral hip soreness.  Continues to have night sweats but not as bad as previously.  Pt having pain looking L or R- eye pain started around Jan 1.  Denies HA w/ exception of mild HA this morning, 'every other day it's a new sx'.  Pt reports intermittent cough.  Pt has lost 8 lbs b/c he has not been able to eat w/o vomiting.  Denies abd pain.  + diarrhea.  Pt is able drink water w/o difficulty.     Review of Systems For ROS see HPI     Objective:   Physical Exam  Constitutional: He is oriented to person, place, and time.  Thin, almost cachetic appearing in the face  HENT:  Head: Normocephalic and atraumatic.  Nose: Nose normal.  Mouth/Throat: Oropharynx is clear and moist. No oropharyngeal exudate.  TMs WNL bilaterally No TTP over sinuses  Eyes: Conjunctivae and EOM are normal. Pupils are equal, round, and reactive to light.  No pain w/ eye movement on PE  Neck: Normal range of motion. Neck supple.  Cardiovascular: Normal rate, regular rhythm, normal heart sounds and intact distal pulses.   Pulmonary/Chest: Effort normal and breath sounds normal. No respiratory distress. He has no wheezes. He has no rales.  Abdominal: Soft. Bowel sounds are normal. He exhibits no distension. There is no tenderness. There is no rebound and no guarding.  Musculoskeletal: He exhibits no edema.  Pain w/ flexion of hips bilaterally- painful to rise from seated position  Lymphadenopathy:    He has no cervical adenopathy.  Neurological: He is alert and oriented to person, place, and time. No cranial nerve deficit. Coordination normal.  Skin: Skin is warm and dry.  Psychiatric:  Angry today  Vitals reviewed.         Assessment & Plan:

## 2015-08-24 NOTE — ED Provider Notes (Signed)
CSN: [id number]     Arrival date & time 08/24/15  1849 History   First MD Initiated Contact with Patient 08/24/15 2133     Chief Complaint  Patient presents with  . Weakness     (Consider location/radiation/quality/duration/timing/severity/associated sxs/prior Treatment) Patient is a 60 y.o. male presenting with weakness. The history is provided by the patient.  Weakness This is a new problem. Episode onset: 2 weekas ago. The problem occurs constantly. The problem has been gradually worsening. Associated symptoms comments: Sweats and chills progressively over 2 weeks. Nothing aggravates the symptoms. Nothing relieves the symptoms. He has tried acetaminophen (NSAIDs) for the symptoms.    Past Medical History  Diagnosis Date  . Cluster headache   . Blood in stool   . Migraine   . Frequent episodic tension-type headache   . Hypertension   . HIV infection ([place])   . Pneumonia   . Stroke [facility])    Past Surgical History  Procedure Laterality Date  . Cyst in back      removed from back  . Tee without cardioversion N/A 06/24/2013    Procedure: TRANSESOPHAGEAL ECHOCARDIOGRAM (TEE);  Surgeon: Jolaine Artist, MD;  Location: [facility] ENDOSCOPY;  Service: Cardiovascular;  Laterality: N/A;   Family History  Problem Relation Age of Onset  . Arthritis Mother   . Heart disease Mother   . Hypertension Mother   . Diabetes Mother   . Arthritis Father   . Heart disease Father   . Hypertension Father   . Colon cancer Neg Hx   . Lung cancer Brother    Social History  Substance Use Topics  . Smoking status: Current Every Day Smoker -- 0.50 packs/day for 35 years    Types: Cigarettes  . Smokeless tobacco: Never Used     Comment: slowing down  . Alcohol Use: 3.6 oz/week    6 Standard drinks or equivalent per week     Comment: no fun drinking by yourself    Review of Systems  Constitutional: Positive for chills, appetite change, fatigue and unexpected weight change.  Neurological: Positive  for weakness.  All other systems reviewed and are negative.     Allergies  Asa and Sulfamethoxazole-trimethoprim  Home Medications   Prior to Admission medications   Medication Sig Start Date End Date Taking? Authorizing Provider  acetaminophen (TYLENOL) 500 MG tablet Take 1,000 mg by mouth every 6 (six) hours as needed for moderate pain.    Yes Historical Provider, MD  amLODipine (NORVASC) 5 MG tablet TAKE 1 TABLET(5 MG) BY MOUTH DAILY 07/18/15  Yes Midge Minium, MD  BYSTOLIC 10 MG tablet TAKE 1 TABLET(10 MG) BY MOUTH DAILY 07/16/15  Yes Midge Minium, MD  clopidogrel (PLAVIX) 75 MG tablet TAKE 1 TABLET BY MOUTH EVERY DAY WITH BREAKFAST 06/07/15  Yes Midge Minium, MD  DIOVAN 320 MG tablet TAKE 1 TABLET(320 MG) BY MOUTH DAILY 06/11/15  Yes Midge Minium, MD  emtricitabine-rilpivir-tenofovir AF (ODEFSEY) 200-25-25 MG TABS tablet Take 1 tablet by mouth daily. 06/14/15  Yes Michel Bickers, MD  ondansetron (ZOFRAN) 4 MG tablet Take 1 tablet (4 mg total) by mouth every 8 (eight) hours as needed for nausea or vomiting. 08/24/15  Yes Midge Minium, MD  Polyethyl Glycol-Propyl Glycol (SYSTANE) 0.4-0.3 % SOLN Apply 1 drop to eye daily as needed (for dry eyes).   Yes Historical Provider, MD  sildenafil (VIAGRA) 100 MG tablet Take 100 mg by mouth daily as needed for erectile dysfunction.  Yes Historical Provider, MD  zidovudine (RETROVIR) 300 MG tablet TAKE 1 TABLET BY MOUTH TWICE A DAY 07/19/15  Yes Michel Bickers, MD  traZODone (DESYREL) 50 MG tablet Take 0.5-1 tablets (25-50 mg total) by mouth at bedtime as needed for sleep. 08/24/15   Midge Minium, MD   BP 140/91 mmHg  Pulse 66  Temp(Src) 98.5 F (36.9 C) (Oral)  Resp 16  SpO2 96% Physical Exam  Constitutional: He is oriented to person, place, and time. He appears cachectic. He has a sickly appearance. No distress.  HENT:  Head: Normocephalic and atraumatic.  Eyes: Conjunctivae are normal.  Neck: Neck supple.  No tracheal deviation present.  Cardiovascular: Normal rate and regular rhythm.   Pulmonary/Chest: Effort normal. No respiratory distress.  Abdominal: Soft. He exhibits no distension.  Neurological: He is alert and oriented to person, place, and time.  Skin: Skin is warm and dry.  Psychiatric: He has a normal mood and affect.    ED Course  Procedures (including critical care time) Labs Review Labs Reviewed  CK - Abnormal; Notable for the following:    Total CK 3409 (*)    All other components within normal limits  CBC WITH DIFFERENTIAL/PLATELET - Abnormal; Notable for the following:    WBC 2.9 (*)    RBC 3.16 (*)    Hemoglobin 11.9 (*)    HCT 34.4 (*)    MCV 108.9 (*)    MCH 37.7 (*)    Neutro Abs 0.6 (*)    All other components within normal limits  BASIC METABOLIC PANEL - Abnormal; Notable for the following:    Sodium 131 (*)    Chloride 100 (*)    CO2 21 (*)    Calcium 8.2 (*)    All other components within normal limits  URINALYSIS, ROUTINE W REFLEX MICROSCOPIC (NOT AT [facility]) - Abnormal; Notable for the following:    Hgb urine dipstick SMALL (*)    Ketones, ur 15 (*)    Protein, ur 30 (*)    All other components within normal limits  CK - Abnormal; Notable for the following:    Total CK 2403 (*)    All other components within normal limits  BASIC METABOLIC PANEL - Abnormal; Notable for the following:    CO2 19 (*)    Calcium 7.4 (*)    All other components within normal limits  CBC - Abnormal; Notable for the following:    WBC 2.5 (*)    RBC 2.57 (*)    Hemoglobin 9.9 (*)    HCT 28.4 (*)    MCV 110.5 (*)    MCH 38.5 (*)    All other components within normal limits  URINE MICROSCOPIC-ADD ON - Abnormal; Notable for the following:    Squamous Epithelial / LPF 0-5 (*)    Bacteria, UA RARE (*)    Casts GRANULAR CAST (*)    All other components within normal limits  URINE CULTURE  CULTURE, BLOOD (ROUTINE X 2)  CULTURE, BLOOD (ROUTINE X 2)  HIV 1 RNA QUANT-NO  REFLEX-BLD    Imaging Review Dg Chest 2 View  08/24/2015  CLINICAL DATA:  Flu-like symptoms. Leg and eye pain. Muscle weakness. History of migraine, hypertension and HIV. EXAM: CHEST  2 VIEW COMPARISON:  Chest radiograph August 15, 2015 FINDINGS: Cardiac silhouette is mildly enlarged, mediastinal silhouette is nonsuspicious. No pleural effusion or focal consolidation. Mild interstitial changes in the lung bases. Soft tissue planes and included osseous structures are nonsuspicious.  IMPRESSION: Mild cardiomegaly. Bibasilar interstitial prominence, could represent atypical infection without focal consolidation. Electronically Signed   By: Elon Alas M.D.   On: 08/24/2015 23:25   Ct Head Wo Contrast  08/24/2015  CLINICAL DATA:  Nausea vomiting.  HIV positive EXAM: CT HEAD WITHOUT CONTRAST TECHNIQUE: Contiguous axial images were obtained from the base of the skull through the vertex without intravenous contrast. COMPARISON:  CT 06/23/2013 FINDINGS: Ventricle size is normal. Negative for acute or chronic infarction. Negative for hemorrhage or fluid collection. Negative for mass or edema. No shift of the midline structures. Calvarium is intact. IMPRESSION: Normal Electronically Signed   By: Franchot Gallo M.D.   On: 08/24/2015 13:04   I have personally reviewed and evaluated these images and lab results as part of my medical decision-making.   EKG Interpretation   Date/Time:  Friday August 24 2015 21:27:09 EST Ventricular Rate:  62 PR Interval:  156 QRS Duration: 86 QT Interval:  406 QTC Calculation: 412 R Axis:   53 Text Interpretation:  Sinus rhythm ED PHYSICIAN INTERPRETATION AVAILABLE  IN [facility] Confirmed by TEST, Record ([id number]) on 08/25/2015 9:45:23  AM      MDM   Final diagnoses:  Non-traumatic rhabdomyolysis  Leukopenia  HIV disease ([place])    60 y.o. male presents with presents with 2 weeks of progressive fatigue, ongoing intermittent sweats and chills, weight loss,  loss of appetite and new onset diffuse muscle pain. Was seen by PCP and noted to have CK >2k in office lab draw, recommended ED evaluation. Here CK appears to be rising to >3k and IVF initiated for rhabdomyolysis. No AKI currently. Pt with concomitant HIV disease on HAART therapy longterm with CD4 last >300 but no recent level drawn. Has new leukopenia and neutropenia of 2.9 and 0.6 concerning for possible infection or advancing HIV which portends a potentially poor prognosis. D/w o/c ID specialist who recommended sending viral load but no indication for fungal or other specialized cultures. Hospitalist was consulted for admission and will see the patient in the emergency department.     Leo Grosser, MD 08/25/15 [phone]

## 2015-08-24 NOTE — Assessment & Plan Note (Signed)
Pt reports that these continue.  Unclear cause as pt did not have elevated WBC and CXR at last visit was clear.  Pt now w/ N/V but no abd pain.  Repeat CBC, ANA, ESR.  Pt will need to f/u w/ ID.

## 2015-08-24 NOTE — Assessment & Plan Note (Addendum)
New.  Pt reports onset of N/V since last visit.  Able to drink water but little else.  Denies abd pain.  + diarrhea.  Due to pt's HIV, will check CMV to assess for co-infxn.  Again, may be viral but there seems to be something else at play.  Check CMP.  CBC w/ diff.  Get head CT to r/o mass effect causing vomiting in absence of abd pain.  Will follow closely.

## 2015-08-24 NOTE — ED Notes (Signed)
Patient transported to X-ray 

## 2015-08-24 NOTE — Assessment & Plan Note (Signed)
Continues.  Pt's CK was mildly elevated 9 days ago and the plan was for him to return for repeat earlier this week but we were not able to reach pt.  He returns today due to lack of improvement in muscle aches and possible worsening pain.  Repeat CK.  Due to proximal bilateral hip pain, PMR is a possibility but he denies shoulder pain.  Repeat ESR- which was elevated at last visit.  Check ANA to assess for rheumatologic cause.  May be due to viral syndrome.  Will follow closely.

## 2015-08-24 NOTE — H&P (Signed)
[facility] Admission History and Physical       Daniel Gonzalez [id number] DOB: August 15, 1956 DOA: 08/24/2015  Referring physician: EDP PCP: Annye Asa, MD  Specialists:   Chief Complaint: Muscle Aches  HPI: Daniel Gonzalez is a 60 y.o. male with a history of HTN, HIV infection, previous CVA who was seen at this PCP's office today and was found to have an elevated CPK level and was referred to the ED.   He had complaints of myalgias, weakness, nausea and vomiting , and night sweats for the past 2-3 weeks.   He has had decreased appetite and a weight loss of 8 pounds.   HE reports having 1 episode of diarrhea today.   In the ED, a repeat CPK level was found to be 3400 and he was started on IVFs and referred for admission.      Review of Systems:  Constitutional: No Weight Loss, No Weight Gain, Night Sweats, +Fevers, +Chills, Dizziness, Light Headedness, Fatigue, or Generalized Weakness,+Myalgias.   HEENT: No Headaches, Difficulty Swallowing,Tooth/Dental Problems,Sore Throat,  No Sneezing, Rhinitis, Ear Ache, Nasal Congestion, or Post Nasal Drip,  Cardio-vascular:  No Chest pain, Orthopnea, PND, Edema in Lower Extremities, Anasarca, Dizziness, Palpitations  Resp: No Dyspnea, No DOE, No Productive Cough, No Non-Productive Cough, No Hemoptysis, No Wheezing.    GI: No Heartburn, Indigestion, Abdominal Pain, +Nausea, +Vomiting, +Diarrhea, Constipation, Hematemesis, Hematochezia, Melena, Change in Bowel Habits,  Loss of Appetite  GU: No Dysuria, No Change in Color of Urine, No Urgency or Urinary Frequency, No Flank pain.  Musculoskeletal: No Joint Pain or Swelling, No Decreased Range of Motion, No Back Pain.  Neurologic: No Syncope, No Seizures, Muscle Weakness, Paresthesia, Vision Disturbance or Loss, No Diplopia, No Vertigo, No Difficulty Walking,  Skin: No Rash or Lesions. Psych: No Change in Mood or Affect, No Depression or Anxiety, No Memory loss, No Confusion, or  Hallucinations   Past Medical History  Diagnosis Date  . Cluster headache   . Blood in stool   . Migraine   . Frequent episodic tension-type headache   . Hypertension   . HIV infection ([place])   . Pneumonia   . Stroke [facility])      Past Surgical History  Procedure Laterality Date  . Cyst in back      removed from back  . Tee without cardioversion N/A 06/24/2013    Procedure: TRANSESOPHAGEAL ECHOCARDIOGRAM (TEE);  Surgeon: Jolaine Artist, MD;  Location[facility] ENDOSCOPY;  Service: Cardiovascular;  Laterality: N/A;      Prior to Admission medications   Medication Sig Start Date End Date Taking? Authorizing Provider  acetaminophen (TYLENOL) 500 MG tablet Take 1,000 mg by mouth every 6 (six) hours as needed for moderate pain.    Yes Historical Provider, MD  amLODipine (NORVASC) 5 MG tablet TAKE 1 TABLET(5 MG) BY MOUTH DAILY 07/18/15  Yes Midge Minium, MD  BYSTOLIC 10 MG tablet TAKE 1 TABLET(10 MG) BY MOUTH DAILY 07/16/15  Yes Midge Minium, MD  clopidogrel (PLAVIX) 75 MG tablet TAKE 1 TABLET BY MOUTH EVERY DAY WITH BREAKFAST 06/07/15  Yes Midge Minium, MD  DIOVAN 320 MG tablet TAKE 1 TABLET(320 MG) BY MOUTH DAILY 06/11/15  Yes Midge Minium, MD  emtricitabine-rilpivir-tenofovir AF (ODEFSEY) 200-25-25 MG TABS tablet Take 1 tablet by mouth daily. 06/14/15  Yes Michel Bickers, MD  ondansetron (ZOFRAN) 4 MG tablet Take 1 tablet (4 mg total) by mouth every 8 (eight) hours as needed for nausea or  vomiting. 08/24/15  Yes Midge Minium, MD  Polyethyl Glycol-Propyl Glycol (SYSTANE) 0.4-0.3 % SOLN Apply 1 drop to eye daily as needed (for dry eyes).   Yes Historical Provider, MD  sildenafil (VIAGRA) 100 MG tablet Take 100 mg by mouth daily as needed for erectile dysfunction.   Yes Historical Provider, MD  zidovudine (RETROVIR) 300 MG tablet TAKE 1 TABLET BY MOUTH TWICE A DAY 07/19/15  Yes Michel Bickers, MD  traZODone (DESYREL) 50 MG tablet Take 0.5-1 tablets (25-50 mg total) by  mouth at bedtime as needed for sleep. 08/24/15   Midge Minium, MD     Allergies  Allergen Reactions  . Asa [Aspirin] Palpitations    Speeds up heart rate  . Sulfamethoxazole-Trimethoprim Itching    Social History:  reports that he has been smoking Cigarettes.  He has a 17.5 pack-year smoking history. He has never used smokeless tobacco. He reports that he drinks about 3.6 oz of alcohol per week. He reports that he uses illicit drugs (Marijuana) about 7 times per week.    Family History  Problem Relation Age of Onset  . Arthritis Mother   . Heart disease Mother   . Hypertension Mother   . Diabetes Mother   . Arthritis Father   . Heart disease Father   . Hypertension Father   . Colon cancer Neg Hx   . Lung cancer Brother        Physical Exam:  GEN:  Pleasant Thin  60 y.o. African American male examined and in no acute distress; cooperative with exam Filed Vitals:   08/24/15 2145 08/24/15 2200 08/24/15 2215 08/24/15 2315  BP: 129/93 131/98 123/89 141/88  Pulse: 64 70 66 71  Temp:      TempSrc:      Resp: 16 22 23 20   SpO2: 97% 99% 99% 100%   Blood pressure 141/88, pulse 71, temperature 98.5 F (36.9 C), temperature source Oral, resp. rate 20, SpO2 100 %. PSYCH: He is alert and oriented x4; does not appear anxious does not appear depressed; affect is normal HEENT: Normocephalic and Atraumatic, Mucous membranes pink; PERRLA; EOM intact; Fundi:  Benign;  No scleral icterus, Nares: Patent, Oropharynx: Clear, Fair Dentition,    Neck:  FROM, No Cervical Lymphadenopathy nor Thyromegaly or Carotid Bruit; No JVD; Breasts:: Not examined CHEST WALL: No tenderness CHEST: Normal respiration, clear to auscultation bilaterally HEART: Regular rate and rhythm; no murmurs rubs or gallops BACK: No kyphosis or scoliosis; No CVA tenderness ABDOMEN: Positive Bowel Sounds, Scaphoid, Soft Non-Tender, No Rebound or Guarding; No Masses, No Organomegaly. Rectal Exam: Not done EXTREMITIES: No  Cyanosis, Clubbing, or Edema; No Ulcerations. Genitalia: not examined PULSES: 2+ and symmetric SKIN: Normal hydration no rash or ulceration CNS:  Alert and Oriented x 4, No Focal Deficits Vascular: pulses palpable throughout    Labs on Admission:  Basic Metabolic Panel:  Recent Labs Lab 08/24/15 1218 08/24/15 2202  NA 131* 131*  K 4.2 4.1  CL 98 100*  CO2 26 21*  GLUCOSE 91 83  BUN 17 17  CREATININE 0.98 0.93  CALCIUM 9.1 8.2*   Liver Function Tests:  Recent Labs Lab 08/24/15 1218  AST 91*  ALT 40  ALKPHOS 71  BILITOT 0.3  PROT 7.8  ALBUMIN 3.9   No results for input(s): LIPASE, AMYLASE in the last 168 hours. No results for input(s): AMMONIA in the last 168 hours. CBC:  Recent Labs Lab 08/24/15 1218 08/24/15 2202  WBC 2.5* 2.9*  NEUTROABS  0.6* 0.6*  HGB 13.0 11.9*  HCT 39.2 34.4*  MCV 113.6* 108.9*  PLT 218.0 185   Cardiac Enzymes:  Recent Labs Lab 08/24/15 1218 08/24/15 2127  CKTOTAL 2674 Repeated and verified X2.* 3409*    BNP (last 3 results) No results for input(s): BNP in the last 8760 hours.  ProBNP (last 3 results) No results for input(s): PROBNP in the last 8760 hours.  CBG: No results for input(s): GLUCAP in the last 168 hours.  Radiological Exams on Admission: Ct Head Wo Contrast  08/24/2015  CLINICAL DATA:  Nausea vomiting.  HIV positive EXAM: CT HEAD WITHOUT CONTRAST TECHNIQUE: Contiguous axial images were obtained from the base of the skull through the vertex without intravenous contrast. COMPARISON:  CT 06/23/2013 FINDINGS: Ventricle size is normal. Negative for acute or chronic infarction. Negative for hemorrhage or fluid collection. Negative for mass or edema. No shift of the midline structures. Calvarium is intact. IMPRESSION: Normal Electronically Signed   By: Franchot Gallo M.D.   On: 08/24/2015 13:04          Assessment/Plan:   60 y.o. male with  Principal Problem:   1.     Rhabdomyolysis   IVFs   Monitor CPK  levels     Active Problems:      2.     Acute viral syndrome   Monitor   Supportive Care        3.     Leukopenia   Monitor Trend     4.     Anemia   Anemia Panel ordered     5.     Hyponatremia   IVFs with NSS     6.     Nausea and vomiting   PRN IV Zofran     7.     Essential hypertension   Continue Amlodipine   Monitor BPs     8.     Human immunodeficiency virus (HIV) disease ([place])   On HAART Rx     9.     DVT Prophylaxis   Lovenox    Code Status:     FULL CODE        Family Communication:    No Family Present    Disposition Plan:    Inpatient  Status        Time spent:   [place] [facility] Pager [phone]   If [place] Please Contact the Day Rounding Team MD for [facility]  If 7PM-7AM, Please Contact Night-Floor Coverage  [email or web address] Password [email or web address] 08/24/2015, 11:21 PM     ADDENDUM:   Patient was seen and examined on 08/24/2015

## 2015-08-25 DIAGNOSIS — E46 Unspecified protein-calorie malnutrition: Secondary | ICD-10-CM | POA: Insufficient documentation

## 2015-08-25 DIAGNOSIS — B349 Viral infection, unspecified: Secondary | ICD-10-CM | POA: Diagnosis not present

## 2015-08-25 DIAGNOSIS — M6282 Rhabdomyolysis: Principal | ICD-10-CM

## 2015-08-25 DIAGNOSIS — E43 Unspecified severe protein-calorie malnutrition: Secondary | ICD-10-CM | POA: Insufficient documentation

## 2015-08-25 DIAGNOSIS — D649 Anemia, unspecified: Secondary | ICD-10-CM | POA: Diagnosis not present

## 2015-08-25 DIAGNOSIS — I10 Essential (primary) hypertension: Secondary | ICD-10-CM | POA: Diagnosis not present

## 2015-08-25 LAB — C DIFFICILE QUICK SCREEN W PCR REFLEX
C DIFFICLE (CDIFF) ANTIGEN: NEGATIVE
C Diff interpretation: NEGATIVE
C Diff toxin: NEGATIVE

## 2015-08-25 LAB — BASIC METABOLIC PANEL
ANION GAP: 9 (ref 5–15)
BUN: 9 mg/dL (ref 6–20)
CO2: 19 mmol/L — ABNORMAL LOW (ref 22–32)
Calcium: 7.4 mg/dL — ABNORMAL LOW (ref 8.9–10.3)
Chloride: 107 mmol/L (ref 101–111)
Creatinine, Ser: 0.72 mg/dL (ref 0.61–1.24)
Glucose, Bld: 74 mg/dL (ref 65–99)
POTASSIUM: 4.2 mmol/L (ref 3.5–5.1)
SODIUM: 135 mmol/L (ref 135–145)

## 2015-08-25 LAB — URINALYSIS, ROUTINE W REFLEX MICROSCOPIC
Bilirubin Urine: NEGATIVE
Glucose, UA: NEGATIVE mg/dL
Ketones, ur: 15 mg/dL — AB
LEUKOCYTES UA: NEGATIVE
NITRITE: NEGATIVE
PH: 5.5 (ref 5.0–8.0)
Protein, ur: 30 mg/dL — AB
SPECIFIC GRAVITY, URINE: 1.024 (ref 1.005–1.030)

## 2015-08-25 LAB — CBC
HEMATOCRIT: 28.4 % — AB (ref 39.0–52.0)
HEMOGLOBIN: 9.9 g/dL — AB (ref 13.0–17.0)
MCH: 38.5 pg — ABNORMAL HIGH (ref 26.0–34.0)
MCHC: 34.9 g/dL (ref 30.0–36.0)
MCV: 110.5 fL — ABNORMAL HIGH (ref 78.0–100.0)
Platelets: 180 10*3/uL (ref 150–400)
RBC: 2.57 MIL/uL — ABNORMAL LOW (ref 4.22–5.81)
RDW: 14.7 % (ref 11.5–15.5)
WBC: 2.5 10*3/uL — AB (ref 4.0–10.5)

## 2015-08-25 LAB — URINE MICROSCOPIC-ADD ON: WBC UA: NONE SEEN WBC/hpf (ref 0–5)

## 2015-08-25 LAB — OCCULT BLOOD X 1 CARD TO LAB, STOOL: Fecal Occult Bld: NEGATIVE

## 2015-08-25 LAB — CK: CK TOTAL: 2403 U/L — AB (ref 49–397)

## 2015-08-25 MED ORDER — ONDANSETRON HCL 4 MG/2ML IJ SOLN
4.0000 mg | Freq: Four times a day (QID) | INTRAMUSCULAR | Status: DC | PRN
  Administered 2015-08-26 – 2015-08-27 (×2): 4 mg via INTRAVENOUS
  Filled 2015-08-25 (×2): qty 2

## 2015-08-25 MED ORDER — NEBIVOLOL HCL 2.5 MG PO TABS
10.0000 mg | ORAL_TABLET | Freq: Every day | ORAL | Status: DC
  Administered 2015-08-25 – 2015-08-27 (×3): 10 mg via ORAL
  Filled 2015-08-25 (×3): qty 4

## 2015-08-25 MED ORDER — ALUM & MAG HYDROXIDE-SIMETH 200-200-20 MG/5ML PO SUSP: 30.0000 mL | Freq: Four times a day (QID) | ORAL | Status: DC | PRN

## 2015-08-25 MED ORDER — HYDROMORPHONE HCL 1 MG/ML IJ SOLN: 0.5000 mg | INTRAMUSCULAR | Status: DC | PRN

## 2015-08-25 MED ORDER — AMLODIPINE BESYLATE 5 MG PO TABS
5.0000 mg | ORAL_TABLET | Freq: Every day | ORAL | Status: DC
  Administered 2015-08-25 – 2015-08-27 (×3): 5 mg via ORAL
  Filled 2015-08-25 (×3): qty 1

## 2015-08-25 MED ORDER — ZOLPIDEM TARTRATE 5 MG PO TABS
5.0000 mg | ORAL_TABLET | Freq: Every evening | ORAL | Status: DC | PRN
  Administered 2015-08-25 – 2015-08-26 (×2): 5 mg via ORAL
  Filled 2015-08-25 (×2): qty 1

## 2015-08-25 MED ORDER — CLOPIDOGREL BISULFATE 75 MG PO TABS
75.0000 mg | ORAL_TABLET | Freq: Every day | ORAL | Status: DC
  Administered 2015-08-25 – 2015-08-27 (×3): 75 mg via ORAL
  Filled 2015-08-25 (×3): qty 1

## 2015-08-25 MED ORDER — ACETAMINOPHEN 325 MG PO TABS
650.0000 mg | ORAL_TABLET | Freq: Four times a day (QID) | ORAL | Status: DC | PRN
  Administered 2015-08-25 – 2015-08-26 (×6): 650 mg via ORAL
  Filled 2015-08-25 (×6): qty 2

## 2015-08-25 MED ORDER — OXYCODONE HCL 5 MG PO TABS
5.0000 mg | ORAL_TABLET | ORAL | Status: DC | PRN
  Administered 2015-08-25 – 2015-08-26 (×3): 5 mg via ORAL
  Filled 2015-08-25 (×3): qty 1

## 2015-08-25 MED ORDER — EMTRICITAB-RILPIVIR-TENOFOV AF 200-25-25 MG PO TABS: 1.0000 | ORAL_TABLET | Freq: Every day | ORAL | Status: DC

## 2015-08-25 MED ORDER — ENSURE ENLIVE PO LIQD
237.0000 mL | Freq: Two times a day (BID) | ORAL | Status: DC
  Administered 2015-08-25: 237 mL via ORAL

## 2015-08-25 MED ORDER — ACETAMINOPHEN 650 MG RE SUPP: 650.0000 mg | Freq: Four times a day (QID) | RECTAL | Status: DC | PRN

## 2015-08-25 MED ORDER — RILPIVIRINE HCL 25 MG PO TABS
25.0000 mg | ORAL_TABLET | Freq: Every day | ORAL | Status: DC
  Administered 2015-08-25 – 2015-08-27 (×3): 25 mg via ORAL
  Filled 2015-08-25 (×4): qty 1

## 2015-08-25 MED ORDER — ONDANSETRON HCL 4 MG PO TABS: 4.0000 mg | ORAL_TABLET | Freq: Four times a day (QID) | ORAL | Status: DC | PRN

## 2015-08-25 MED ORDER — SODIUM CHLORIDE 0.9 % IV SOLN
INTRAVENOUS | Status: DC
  Administered 2015-08-25 – 2015-08-27 (×5): via INTRAVENOUS

## 2015-08-25 MED ORDER — ZIDOVUDINE 100 MG PO CAPS
300.0000 mg | ORAL_CAPSULE | Freq: Two times a day (BID) | ORAL | Status: DC
  Administered 2015-08-25 – 2015-08-27 (×6): 300 mg via ORAL
  Filled 2015-08-25 (×8): qty 3

## 2015-08-25 MED ORDER — EMTRICITABINE-TENOFOVIR AF 200-25 MG PO TABS
1.0000 | ORAL_TABLET | Freq: Every day | ORAL | Status: DC
  Administered 2015-08-25 – 2015-08-27 (×3): 1 via ORAL
  Filled 2015-08-25 (×3): qty 1

## 2015-08-25 MED ORDER — ENSURE ENLIVE PO LIQD
237.0000 mL | Freq: Three times a day (TID) | ORAL | Status: DC
  Administered 2015-08-25 – 2015-08-27 (×2): 237 mL via ORAL

## 2015-08-25 NOTE — Progress Notes (Signed)
Initial Nutrition Assessment  DOCUMENTATION CODES:   Severe malnutrition in context of acute illness/injury  INTERVENTION:    Ensure Enlive po TID, each supplement provides 350 kcal and 20 grams of protein  NUTRITION DIAGNOSIS:   Malnutrition related to acute illness as evidenced by energy intake < or equal to 50% for > or equal to 5 days, percent weight loss (7% weight loss within one month).  GOAL:   Patient will meet greater than or equal to 90% of their needs  MONITOR:   PO intake, Supplement acceptance, Weight trends  REASON FOR ASSESSMENT:   Malnutrition Screening Tool    ASSESSMENT:   60 y.o. male with a history of HTN, HIV infection, previous CVA who was seen at this PCP's office today and was found to have an elevated CPK level and was referred to the ED. He had complaints of myalgias, weakness, nausea and vomiting , and night sweats for the past 2-3 weeks. He has had decreased appetite and a weight loss of 8 pounds.  Patient reports that he has been eating poorly for the past 1-2 weeks and has lost ~9 lbs. He hasn't been able to keep anything down. Today is the first day that he's been able to eat anything. He just finished eating some fruit. He is lactose intolerant. Agreed to try Ensure Enlive supplements to maximize oral intake of protein and calories. Ensure and Boost supplements are lactose free.   Unable to complete Nutrition-Focused physical exam at this time.   Patient with severe PCM in the context of acute illness given intake </= 50% of estimated energy requirement for >/= 5 days and 7% weight loss within the past month.   Diet Order:  Diet Heart Room service appropriate?: Yes; Fluid consistency:: Thin  Skin:  Reviewed, no issues  Last BM:  1/7  Height:   Ht Readings from Last 1 Encounters:  08/25/15 5\' 4"  (1.626 m)   Weight:   Wt Readings from Last 1 Encounters:  08/25/15 126 lb 12.2 oz (57.5 kg)    Ideal Body Weight:  59.1 kg  BMI:   Body mass index is 21.75 kg/(m^2).  Estimated Nutritional Needs:   Kcal:  1800-2000  Protein:  85-100 gm  Fluid:  1.8-2 L  EDUCATION NEEDS:   No education needs identified at this time  Molli Barrows, [place], [place], [place] Pager [phone] After Hours Pager [phone]

## 2015-08-25 NOTE — Progress Notes (Signed)
Pt refused his ensure nutritional drink said he hate the teste and he refused to drink the one that was given to him previosly still on his bed side table not open

## 2015-08-25 NOTE — Progress Notes (Signed)
NURSING PROGRESS NOTE  Daniel Gonzalez  MRN: [id number]  Admission Data: 08/25/2015 1:33 AM  Attending Provider: Theressa Millard, MD  PCP: Annye Asa, MD  Code status: FULL  Allergies:  Allergies  Allergen Reactions  . Asa [Aspirin] Palpitations    Speeds up heart rate  . Sulfamethoxazole-Trimethoprim Itching     Past Medical History:  has a past medical history of Cluster headache; Blood in stool; Migraine; Frequent episodic tension-type headache; Hypertension; HIV infection ([place]); Pneumonia; and Stroke ([place]).   Past Surgical History:  has past surgical history that includes cyst in back and TEE without cardioversion (N/A, 06/24/2013).   Daniel Gonzalez is a 60 y.o.  male patient, arrived to floor in room [phone] via stretcher, transferred from ED. Patient alert and oriented X 4. No acute distress noted. Complains of pain 7/10 over bilateral legs, describing as intermittent soreness.   Vital signs: Oral temperature 100.5 F (38.1 C), Blood pressure 144/93, Pulse 71, RR 20, SpO2 100 % on room air. Height 5'4" (132.6 cm), weight 127 lbs (57.7 kg).   Cardiac monitoring: None  IV access: Right antecubital; condition patent and no redness.  Skin: intact, no pressure ulcer noted in sacral area.   Patient's ID armband verified with patient/ family, and in place. Information packet given to patient/ family. Fall risk assessed, SR up X2, patient/ family able to verbalize understanding of risks associated with falls and to call nurse or staff to assist before getting out of bed. Patient/ family oriented to room and equipment. Call bell within reach.

## 2015-08-25 NOTE — Progress Notes (Signed)
Received report on patient from Anderson Malta, [place] from ED. Pt has infectious disease consult, CXR showed "atypical infection". This RN tried to figure out if pt needs to be ruled out for TB because 5[facility] do not have airborne room. Anderson Malta, RN talked to MD & called back; MD stated pt can be transferred to [facility].

## 2015-08-25 NOTE — ED Notes (Signed)
At RN floors request RN contacted MD Arnoldo Morale to request what she suspected that the pt had (reason for infec. Control order).  MD Arnoldo Morale informed RN "nothing...  She needs to go to that room."

## 2015-08-25 NOTE — ED Notes (Addendum)
Attempted to call report. Will call back w/ more information that was requested by RN Joellen Jersey

## 2015-08-25 NOTE — Progress Notes (Signed)
[facility] PROGRESS NOTE  Daniel Gonzalez [id number] DOB: May 10, 1956 DOA: 08/24/2015 PCP: Annye Asa, MD  Assessment/Plan: 1. Rhabdomyolysis: IV hydration and repeat CK levels.   2. Viral syndrome: Improving.    3. HIV:  HAART treatment.    Hypertension: Better controlled.     Code Status: full code.  Family Communication: none atbedside.  Disposition Plan: pending.    Consultants none  Procedures:  none  Antibiotics:  none  HPI/Subjective: Reports feeling better.   Objective: Filed Vitals:   08/25/15 0649 08/25/15 1359  BP: 123/77 110/65  Pulse: 67 61  Temp: 98.7 F (37.1 C) 98.6 F (37 C)  Resp: 20 19    Intake/Output Summary (Last 24 hours) at 08/25/15 2024 Last data filed at 08/25/15 1900  Gross per 24 hour  Intake 3336.67 ml  Output    350 ml  Net 2986.67 ml   Filed Weights   08/25/15 0118 08/25/15 0121  Weight: 57.743 kg (127 lb 4.8 oz) 57.5 kg (126 lb 12.2 oz)    Exam:   General:  Alert afebrile comfortable.   Cardiovascular: s1s2  Respiratory: ctab  Abdomen: soft NT nd bs+  Musculoskeletal: no pedal edema.   Data Reviewed: Basic Metabolic Panel:  Recent Labs Lab 08/24/15 1218 08/24/15 2202 08/25/15 0645  NA 131* 131* 135  K 4.2 4.1 4.2  CL 98 100* 107  CO2 26 21* 19*  GLUCOSE 91 83 74  BUN 17 17 9   CREATININE 0.98 0.93 0.72  CALCIUM 9.1 8.2* 7.4*   Liver Function Tests:  Recent Labs Lab 08/24/15 1218  AST 91*  ALT 40  ALKPHOS 71  BILITOT 0.3  PROT 7.8  ALBUMIN 3.9   No results for input(s): LIPASE, AMYLASE in the last 168 hours. No results for input(s): AMMONIA in the last 168 hours. CBC:  Recent Labs Lab 08/24/15 1218 08/24/15 2202 08/25/15 0645  WBC 2.5* 2.9* 2.5*  NEUTROABS 0.6* 0.6*  --   HGB 13.0 11.9* 9.9*  HCT 39.2 34.4* 28.4*  MCV 113.6* 108.9* 110.5*  PLT 218.0 185 180   Cardiac Enzymes:  Recent Labs Lab 08/24/15 1218 08/24/15 2127 08/25/15 0645  CKTOTAL  2674 Repeated and verified X2.* 3409* 2403*   BNP (last 3 results) No results for input(s): BNP in the last 8760 hours.  ProBNP (last 3 results) No results for input(s): PROBNP in the last 8760 hours.  CBG: No results for input(s): GLUCAP in the last 168 hours.  Recent Results (from the past 240 hour(s))  C difficile quick scan w PCR reflex     Status: None   Collection Time: 08/25/15  1:55 PM  Result Value Ref Range Status   C Diff antigen NEGATIVE NEGATIVE Final   C Diff toxin NEGATIVE NEGATIVE Final   C Diff interpretation Negative for toxigenic C. difficile  Final     Studies: Dg Chest 2 View  08/24/2015  CLINICAL DATA:  Flu-like symptoms. Leg and eye pain. Muscle weakness. History of migraine, hypertension and HIV. EXAM: CHEST  2 VIEW COMPARISON:  Chest radiograph August 15, 2015 FINDINGS: Cardiac silhouette is mildly enlarged, mediastinal silhouette is nonsuspicious. No pleural effusion or focal consolidation. Mild interstitial changes in the lung bases. Soft tissue planes and included osseous structures are nonsuspicious. IMPRESSION: Mild cardiomegaly. Bibasilar interstitial prominence, could represent atypical infection without focal consolidation. Electronically Signed   By: Elon Alas M.D.   On: 08/24/2015 23:25   Ct Head Wo Contrast  08/24/2015  CLINICAL DATA:  Nausea vomiting.  HIV positive EXAM: CT HEAD WITHOUT CONTRAST TECHNIQUE: Contiguous axial images were obtained from the base of the skull through the vertex without intravenous contrast. COMPARISON:  CT 06/23/2013 FINDINGS: Ventricle size is normal. Negative for acute or chronic infarction. Negative for hemorrhage or fluid collection. Negative for mass or edema. No shift of the midline structures. Calvarium is intact. IMPRESSION: Normal Electronically Signed   By: Franchot Gallo M.D.   On: 08/24/2015 13:04    Scheduled Meds: . amLODipine  5 mg Oral Daily  . clopidogrel  75 mg Oral Daily  .  emtricitabine-tenofovir AF  1 tablet Oral Daily   And  . rilpivirine  25 mg Oral Q breakfast  . feeding supplement (ENSURE ENLIVE)  237 mL Oral TID BM  . nebivolol  10 mg Oral Daily  . zidovudine  300 mg Oral BID   Continuous Infusions: . sodium chloride 100 mL/hr at 08/25/15 1616    Principal Problem:   Rhabdomyolysis Active Problems:   Human immunodeficiency virus (HIV) disease (HCC)   Essential hypertension   Acute viral syndrome   Leukopenia   Anemia   Hyponatremia   Nausea and vomiting   Protein-calorie malnutrition, severe    Time spent: 25 min    [place]  [phone]  If 7PM-7AM, please contact night-coverage at [email or web address], password [facility] 08/25/2015, 8:24 PM  LOS: 1 day

## 2015-08-26 ENCOUNTER — Inpatient Hospital Stay (HOSPITAL_COMMUNITY): Payer: BLUE CROSS/BLUE SHIELD

## 2015-08-26 DIAGNOSIS — B20 Human immunodeficiency virus [HIV] disease: Secondary | ICD-10-CM

## 2015-08-26 DIAGNOSIS — B349 Viral infection, unspecified: Secondary | ICD-10-CM | POA: Diagnosis not present

## 2015-08-26 DIAGNOSIS — D649 Anemia, unspecified: Secondary | ICD-10-CM

## 2015-08-26 DIAGNOSIS — I10 Essential (primary) hypertension: Secondary | ICD-10-CM

## 2015-08-26 DIAGNOSIS — M6282 Rhabdomyolysis: Secondary | ICD-10-CM | POA: Diagnosis not present

## 2015-08-26 LAB — IRON AND TIBC
Iron: 49 ug/dL (ref 45–182)
Saturation Ratios: 28 % (ref 17.9–39.5)
TIBC: 176 ug/dL — ABNORMAL LOW (ref 250–450)
UIBC: 127 ug/dL

## 2015-08-26 LAB — BASIC METABOLIC PANEL
Anion gap: 7 (ref 5–15)
CO2: 21 mmol/L — ABNORMAL LOW (ref 22–32)
CREATININE: 0.63 mg/dL (ref 0.61–1.24)
Calcium: 8 mg/dL — ABNORMAL LOW (ref 8.9–10.3)
Chloride: 108 mmol/L (ref 101–111)
GFR calc Af Amer: >60 mL/min (ref >60)
Glucose, Bld: 79 mg/dL (ref 65–99)
Potassium: 4 mmol/L (ref 3.5–5.1)
SODIUM: 136 mmol/L (ref 135–145)

## 2015-08-26 LAB — URINE CULTURE

## 2015-08-26 LAB — FERRITIN: Ferritin: 1252 ng/mL — ABNORMAL HIGH (ref 24–336)

## 2015-08-26 LAB — CK: Total CK: 1691 U/L — ABNORMAL HIGH (ref 49–397)

## 2015-08-26 LAB — VITAMIN B12: VITAMIN B 12: 797 pg/mL (ref 180–914)

## 2015-08-26 LAB — FOLATE: Folate: 10.4 ng/mL (ref >5.9)

## 2015-08-26 LAB — RETICULOCYTES: RBC.: 2.94 MIL/uL — AB (ref 4.22–5.81)

## 2015-08-26 NOTE — Progress Notes (Signed)
[facility] PROGRESS NOTE  Daniel Gonzalez [id number] DOB: 04-Jun-1956 DOA: 08/24/2015 PCP: Annye Asa, MD  Assessment/Plan: 1. Rhabdomyolysis: IV hydration and repeat CK levels show improvement.   2. Viral syndrome :  Coughing, plan for CXR today.  Improving.    3. HIV:  HAART treatment.    Hypertension: Better controlled.    Anemia: Stool for occult blood negative.  Anemia panel ordered.    Code Status: full code.  Family Communication: none at bedside.  Disposition Plan: pending.    Consultants none  Procedures:  none  Antibiotics:  none  HPI/Subjective: Reports feeling better. Has a headache.   Objective: Filed Vitals:   08/26/15 0029 08/26/15 0410  BP: 130/77 129/73  Pulse: 57 59  Temp: 99.2 F (37.3 C) 98.9 F (37.2 C)  Resp: 16 18    Intake/Output Summary (Last 24 hours) at 08/26/15 1306 Last data filed at 08/26/15 1105  Gross per 24 hour  Intake 1636.67 ml  Output   1675 ml  Net -38.33 ml   Filed Weights   08/25/15 0118 08/25/15 0121  Weight: 57.743 kg (127 lb 4.8 oz) 57.5 kg (126 lb 12.2 oz)    Exam:   General:  Alert afebrile comfortable.   Cardiovascular: s1s2  Respiratory: ctab  Abdomen: soft NT nd bs+  Musculoskeletal: no pedal edema.   Data Reviewed: Basic Metabolic Panel:  Recent Labs Lab 08/24/15 1218 08/24/15 2202 08/25/15 0645 08/26/15 0622  NA 131* 131* 135 136  K 4.2 4.1 4.2 4.0  CL 98 100* 107 108  CO2 26 21* 19* 21*  GLUCOSE 91 83 74 79  BUN 17 17 9  <5*  CREATININE 0.98 0.93 0.72 0.63  CALCIUM 9.1 8.2* 7.4* 8.0*   Liver Function Tests:  Recent Labs Lab 08/24/15 1218  AST 91*  ALT 40  ALKPHOS 71  BILITOT 0.3  PROT 7.8  ALBUMIN 3.9   No results for input(s): LIPASE, AMYLASE in the last 168 hours. No results for input(s): AMMONIA in the last 168 hours. CBC:  Recent Labs Lab 08/24/15 1218 08/24/15 2202 08/25/15 0645  WBC 2.5* 2.9* 2.5*  NEUTROABS 0.6* 0.6*  --    HGB 13.0 11.9* 9.9*  HCT 39.2 34.4* 28.4*  MCV 113.6* 108.9* 110.5*  PLT 218.0 185 180   Cardiac Enzymes:  Recent Labs Lab 08/24/15 1218 08/24/15 2127 08/25/15 0645 08/26/15 0622  CKTOTAL 2674 Repeated and verified X2.* 3409* 2403* 1691*   BNP (last 3 results) No results for input(s): BNP in the last 8760 hours.  ProBNP (last 3 results) No results for input(s): PROBNP in the last 8760 hours.  CBG: No results for input(s): GLUCAP in the last 168 hours.  Recent Results (from the past 240 hour(s))  Blood culture (routine x 2)     Status: None (Preliminary result)   Collection Time: 08/24/15 10:44 PM  Result Value Ref Range Status   Specimen Description BLOOD LEFT ARM  Final   Special Requests BOTTLES DRAWN AEROBIC AND ANAEROBIC 5ML  Final   Culture NO GROWTH 1 DAY  Final   Report Status PENDING  Incomplete  Blood culture (routine x 2)     Status: None (Preliminary result)   Collection Time: 08/24/15 10:46 PM  Result Value Ref Range Status   Specimen Description BLOOD LEFT HAND  Final   Special Requests AEROBIC BOTTLE ONLY 5ML  Final   Culture NO GROWTH 1 DAY  Final   Report Status PENDING  Incomplete  C difficile quick  scan w PCR reflex     Status: None   Collection Time: 08/25/15  1:55 PM  Result Value Ref Range Status   C Diff antigen NEGATIVE NEGATIVE Final   C Diff toxin NEGATIVE NEGATIVE Final   C Diff interpretation Negative for toxigenic C. difficile  Final     Studies: Dg Chest 2 View  08/24/2015  CLINICAL DATA:  Flu-like symptoms. Leg and eye pain. Muscle weakness. History of migraine, hypertension and HIV. EXAM: CHEST  2 VIEW COMPARISON:  Chest radiograph August 15, 2015 FINDINGS: Cardiac silhouette is mildly enlarged, mediastinal silhouette is nonsuspicious. No pleural effusion or focal consolidation. Mild interstitial changes in the lung bases. Soft tissue planes and included osseous structures are nonsuspicious. IMPRESSION: Mild cardiomegaly. Bibasilar  interstitial prominence, could represent atypical infection without focal consolidation. Electronically Signed   By: Elon Alas M.D.   On: 08/24/2015 23:25    Scheduled Meds: . amLODipine  5 mg Oral Daily  . clopidogrel  75 mg Oral Daily  . emtricitabine-tenofovir AF  1 tablet Oral Daily   And  . rilpivirine  25 mg Oral Q breakfast  . feeding supplement (ENSURE ENLIVE)  237 mL Oral TID BM  . nebivolol  10 mg Oral Daily  . zidovudine  300 mg Oral BID   Continuous Infusions: . sodium chloride 100 mL/hr at 08/26/15 0458    Principal Problem:   Rhabdomyolysis Active Problems:   Human immunodeficiency virus (HIV) disease (HCC)   Essential hypertension   Acute viral syndrome   Leukopenia   Anemia   Hyponatremia   Nausea and vomiting   Protein-calorie malnutrition, severe    Time spent: 25 min    [place]  [phone]  If 7PM-7AM, please contact night-coverage at [email or web address], password [facility] 08/26/2015, 1:06 PM  LOS: 2 days

## 2015-08-27 DIAGNOSIS — M6282 Rhabdomyolysis: Secondary | ICD-10-CM | POA: Diagnosis not present

## 2015-08-27 DIAGNOSIS — B349 Viral infection, unspecified: Secondary | ICD-10-CM | POA: Diagnosis not present

## 2015-08-27 LAB — CBC
HEMATOCRIT: 31.3 % — AB (ref 39.0–52.0)
HEMOGLOBIN: 10.7 g/dL — AB (ref 13.0–17.0)
MCH: 37.2 pg — ABNORMAL HIGH (ref 26.0–34.0)
MCHC: 34.2 g/dL (ref 30.0–36.0)
MCV: 108.7 fL — AB (ref 78.0–100.0)
Platelets: 193 10*3/uL (ref 150–400)
RBC: 2.88 MIL/uL — AB (ref 4.22–5.81)
RDW: 14.3 % (ref 11.5–15.5)
WBC: 2.4 10*3/uL — AB (ref 4.0–10.5)

## 2015-08-27 LAB — PATHOLOGIST SMEAR REVIEW

## 2015-08-27 LAB — CK: Total CK: 988 U/L — ABNORMAL HIGH (ref 49–397)

## 2015-08-27 LAB — ANA: ANA: NEGATIVE

## 2015-08-27 MED ORDER — ENSURE ENLIVE PO LIQD: 237.0000 mL | Freq: Three times a day (TID) | ORAL | Status: DC

## 2015-08-27 NOTE — Progress Notes (Signed)
Nsg Discharge Note  Admit Date:  08/24/2015 Discharge date: 08/27/2015   Daniel Gonzalez to be D/C'd Home per MD order.  AVS completed.  Copy for chart, and copy for patient signed, and dated. Patient/caregiver able to verbalize understanding.  Discharge Medication:   Medication List    STOP taking these medications        sildenafil 100 MG tablet  Commonly known as:  VIAGRA      TAKE these medications        acetaminophen 500 MG tablet  Commonly known as:  TYLENOL  Take 1,000 mg by mouth every 6 (six) hours as needed for moderate pain.     amLODipine 5 MG tablet  Commonly known as:  NORVASC  TAKE 1 TABLET(5 MG) BY MOUTH DAILY     BYSTOLIC 10 MG tablet  Generic drug:  nebivolol  TAKE 1 TABLET(10 MG) BY MOUTH DAILY     clopidogrel 75 MG tablet  Commonly known as:  PLAVIX  TAKE 1 TABLET BY MOUTH EVERY DAY WITH BREAKFAST     DIOVAN 320 MG tablet  Generic drug:  valsartan  TAKE 1 TABLET(320 MG) BY MOUTH DAILY     emtricitabine-rilpivir-tenofovir AF 200-25-25 MG Tabs tablet  Commonly known as:  ODEFSEY  Take 1 tablet by mouth daily.     feeding supplement (ENSURE ENLIVE) Liqd  Take 237 mLs by mouth 3 (three) times daily between meals.     ondansetron 4 MG tablet  Commonly known as:  ZOFRAN  Take 1 tablet (4 mg total) by mouth every 8 (eight) hours as needed for nausea or vomiting.     SYSTANE 0.4-0.3 % Soln  Generic drug:  Polyethyl Glycol-Propyl Glycol  Apply 1 drop to eye daily as needed (for dry eyes).     traZODone 50 MG tablet  Commonly known as:  DESYREL  Take 0.5-1 tablets (25-50 mg total) by mouth at bedtime as needed for sleep.     zidovudine 300 MG tablet  Commonly known as:  RETROVIR  TAKE 1 TABLET BY MOUTH TWICE A DAY        Discharge Assessment: Filed Vitals:   08/27/15 0451 08/27/15 1003  BP: 130/78 126/70  Pulse: 65 64  Temp: 98 F (36.7 C) 98.6 F (37 C)  Resp: 18 20   Skin clean, dry and intact without evidence of skin break down, no  evidence of skin tears noted. IV catheter discontinued intact. Site without signs and symptoms of complications - no redness or edema noted at insertion site, patient denies c/o pain - only slight tenderness at site.  Dressing with slight pressure applied.  D/c Instructions-Education: Discharge instructions given to patient/family with verbalized understanding. D/c education completed with patient/family including follow up instructions, medication list, d/c activities limitations if indicated, with other d/c instructions as indicated by MD - patient able to verbalize understanding, all questions fully answered. Patient instructed to return to ED, call 911, or call MD for any changes in condition.  Patient escorted via [place], and D/C home via private auto.  Dayle Points, RN 08/27/2015 2:26 PM

## 2015-08-27 NOTE — Discharge Summary (Signed)
Physician Discharge Summary  Daniel Gonzalez [id number] DOB: [birth year] DOA: 08/24/2015  PCP: Annye Asa, MD  Admit date: 08/24/2015 Discharge date: 08/27/2015  Time spent: 25 minutes  Recommendations for Outpatient Follow-up:  1. Follow up with PCP in 1 week, post hospitalization visit.   Discharge Diagnoses:  Principal Problem:   Rhabdomyolysis Active Problems:   Human immunodeficiency virus (HIV) disease ([place])   Essential hypertension   Acute viral syndrome   Leukopenia   Anemia   Hyponatremia   Nausea and vomiting   Protein-calorie malnutrition, severe   Discharge Condition: improved  Diet recommendation: regular diet.  Filed Weights   08/25/15 0118 08/25/15 0121  Weight: 57.743 kg (127 lb 4.8 oz) 57.5 kg (126 lb 12.2 oz)    History of present illness:   Daniel Gonzalez is a 60 y.o. male with a history of HTN, HIV infection, previous CVA who was seen at this PCP's office today and was found to have an elevated CPK level and was referred to the ED. He had complaints of myalgias, weakness, nausea and vomiting , and night sweats for the past 2-3 weeks.   Hospital Course:  1. Rhabdomyolysis: IV hydration with normal saline and repeat CK levels show much  improvement.  His myalgias have resolved.  He wanted to go home and follow up with PCP.   2. Viral syndrome :  Resolved. No fever or chills. No nausea, vomiting or diarrhea. CXR negative for pneumonia or consolidation. c diff pcr is negative.  Blood cultures are negative  So far.     3. HIV:  HAART treatment.    Hypertension: Better controlled.    Anemia: Stool for occult blood negative.  Anemia panel ordered, showed normal iron levels, elevated ferritin, normal levels of [id number] and folic acid. Stable around 10.   Procedures:  none  Consultations:  none  Discharge Exam: Filed Vitals:   08/27/15 0451 08/27/15 1003  BP: 130/78 126/70  Pulse: 65 64  Temp: 98 F (36.7 C) 98.6 F (37  C)  Resp: 18 20    General: alert comfortable.  Cardiovascular: s1s2 Respiratory: ctab  Discharge Instructions   Discharge Instructions    Diet - low sodium heart healthy    Complete by:  As directed      Discharge instructions    Complete by:  As directed   PLEASE FOLLOW up with PCP in 1 to 2 weeks and get CK levels checked at office visit.          Discharge Medication List as of 08/27/2015  1:42 PM    START taking these medications   Details  feeding supplement, ENSURE ENLIVE, (ENSURE ENLIVE) LIQD Take 237 mLs by mouth 3 (three) times daily between meals., Starting 08/27/2015, Until Discontinued, No Print      CONTINUE these medications which have NOT CHANGED   Details  acetaminophen (TYLENOL) 500 MG tablet Take 1,000 mg by mouth every 6 (six) hours as needed for moderate pain. , Until Discontinued, Historical Med    amLODipine (NORVASC) 5 MG tablet TAKE 1 TABLET(5 MG) BY MOUTH DAILY, Normal    BYSTOLIC 10 MG tablet TAKE 1 TABLET(10 MG) BY MOUTH DAILY, Normal    clopidogrel (PLAVIX) 75 MG tablet TAKE 1 TABLET BY MOUTH EVERY DAY WITH BREAKFAST, Normal    DIOVAN 320 MG tablet TAKE 1 TABLET(320 MG) BY MOUTH DAILY, Normal    emtricitabine-rilpivir-tenofovir AF (ODEFSEY) 200-25-25 MG TABS tablet Take 1 tablet by mouth daily., Starting 06/14/2015, Until Discontinued,  Normal    ondansetron (ZOFRAN) 4 MG tablet Take 1 tablet (4 mg total) by mouth every 8 (eight) hours as needed for nausea or vomiting., Starting 08/24/2015, Until Discontinued, Normal    Polyethyl Glycol-Propyl Glycol (SYSTANE) 0.4-0.3 % SOLN Apply 1 drop to eye daily as needed (for dry eyes)., Until Discontinued, Historical Med    zidovudine (RETROVIR) 300 MG tablet TAKE 1 TABLET BY MOUTH TWICE A DAY, Normal    traZODone (DESYREL) 50 MG tablet Take 0.5-1 tablets (25-50 mg total) by mouth at bedtime as needed for sleep., Starting 08/24/2015, Until Discontinued, Normal      STOP taking these medications      sildenafil (VIAGRA) 100 MG tablet        Allergies  Allergen Reactions  . Asa [Aspirin] Palpitations    Speeds up heart rate  . Sulfamethoxazole-Trimethoprim Itching   Follow-up Information    Follow up with Annye Asa, MD. Schedule an appointment as soon as possible for a visit in 1 week.   Specialty:  Family Medicine   Why:  Left message with trhe doctor office for them to call her at home   Contact information:   [address] [place] [id number] [phone]        The results of significant diagnostics from this hospitalization (including imaging, microbiology, ancillary and laboratory) are listed below for reference.    Significant Diagnostic Studies: Dg Chest 2 View  08/26/2015  CLINICAL DATA:  Myalgias, weakness, nausea and vomiting, night sweats. Weight loss. Cough. EXAM: CHEST  2 VIEW COMPARISON:  Chest x-ray dated 08/24/2015. Comparison also made to earlier chest x-ray of 06/22/2013. FINDINGS: There is stable mild pleural thickening/scarring at each lung apex. Lungs otherwise clear. Lung volumes are normal. Heart size is normal. Overall cardiomediastinal silhouette is stable in size and configuration. Osseous structures about the chest are unremarkable. IMPRESSION: Lungs are clear and there is no evidence of acute cardiopulmonary abnormality. No evidence of pneumonia. Electronically Signed   By: Franki Cabot M.D.   On: 08/26/2015 15:23   Dg Chest 2 View  08/24/2015  CLINICAL DATA:  Flu-like symptoms. Leg and eye pain. Muscle weakness. History of migraine, hypertension and HIV. EXAM: CHEST  2 VIEW COMPARISON:  Chest radiograph August 15, 2015 FINDINGS: Cardiac silhouette is mildly enlarged, mediastinal silhouette is nonsuspicious. No pleural effusion or focal consolidation. Mild interstitial changes in the lung bases. Soft tissue planes and included osseous structures are nonsuspicious. IMPRESSION: Mild cardiomegaly. Bibasilar interstitial prominence,  could represent atypical infection without focal consolidation. Electronically Signed   By: Elon Alas M.D.   On: 08/24/2015 23:25   Dg Chest 2 View  08/15/2015  CLINICAL DATA:  Cough with fever and night sweats EXAM: CHEST  2 VIEW COMPARISON:  June 22, 2013 FINDINGS: There is no edema or consolidation. Heart is upper normal in size with pulmonary vascularity within normal limits. No adenopathy. No bone lesions. IMPRESSION: No edema or consolidation. Electronically Signed   By: Lowella Grip III M.D.   On: 08/15/2015 09:12   Ct Head Wo Contrast  08/24/2015  CLINICAL DATA:  Nausea vomiting.  HIV positive EXAM: CT HEAD WITHOUT CONTRAST TECHNIQUE: Contiguous axial images were obtained from the base of the skull through the vertex without intravenous contrast. COMPARISON:  CT 06/23/2013 FINDINGS: Ventricle size is normal. Negative for acute or chronic infarction. Negative for hemorrhage or fluid collection. Negative for mass or edema. No shift of the midline structures. Calvarium is intact. IMPRESSION:  Normal Electronically Signed   By: Franchot Gallo M.D.   On: 08/24/2015 13:04    Microbiology: Recent Results (from the past 240 hour(s))  Blood culture (routine x 2)     Status: None (Preliminary result)   Collection Time: 08/24/15 10:44 PM  Result Value Ref Range Status   Specimen Description BLOOD LEFT ARM  Final   Special Requests BOTTLES DRAWN AEROBIC AND ANAEROBIC 5ML  Final   Culture NO GROWTH 2 DAYS  Final   Report Status PENDING  Incomplete  Blood culture (routine x 2)     Status: None (Preliminary result)   Collection Time: 08/24/15 10:46 PM  Result Value Ref Range Status   Specimen Description BLOOD LEFT HAND  Final   Special Requests AEROBIC BOTTLE ONLY 5ML  Final   Culture NO GROWTH 2 DAYS  Final   Report Status PENDING  Incomplete  Urine culture     Status: None   Collection Time: 08/25/15  1:27 AM  Result Value Ref Range Status   Specimen Description URINE, CLEAN  CATCH  Final   Special Requests NONE  Final   Culture MULTIPLE SPECIES PRESENT, SUGGEST RECOLLECTION  Final   Report Status 08/26/2015 FINAL  Final  C difficile quick scan w PCR reflex     Status: None   Collection Time: 08/25/15  1:55 PM  Result Value Ref Range Status   C Diff antigen NEGATIVE NEGATIVE Final   C Diff toxin NEGATIVE NEGATIVE Final   C Diff interpretation Negative for toxigenic C. difficile  Final     Labs: Basic Metabolic Panel:  Recent Labs Lab 08/24/15 1218 08/24/15 2202 08/25/15 0645 08/26/15 0622  NA 131* 131* 135 136  K 4.2 4.1 4.2 4.0  CL 98 100* 107 108  CO2 26 21* 19* 21*  GLUCOSE 91 83 74 79  BUN 17 17 9  <5*  CREATININE 0.98 0.93 0.72 0.63  CALCIUM 9.1 8.2* 7.4* 8.0*   Liver Function Tests:  Recent Labs Lab 08/24/15 1218  AST 91*  ALT 40  ALKPHOS 71  BILITOT 0.3  PROT 7.8  ALBUMIN 3.9   No results for input(s): LIPASE, AMYLASE in the last 168 hours. No results for input(s): AMMONIA in the last 168 hours. CBC:  Recent Labs Lab 08/24/15 1218 08/24/15 2202 08/25/15 0645 08/27/15 0929  WBC 2.5* 2.9* 2.5* 2.4*  NEUTROABS 0.6* 0.6*  --   --   HGB 13.0 11.9* 9.9* 10.7*  HCT 39.2 34.4* 28.4* 31.3*  MCV 113.6* 108.9* 110.5* 108.7*  PLT 218.0 185 180 193   Cardiac Enzymes:  Recent Labs Lab 08/24/15 1218 08/24/15 2127 08/25/15 0645 08/26/15 0622 08/27/15 0929  CKTOTAL 2674 Repeated and verified X2.* 3409* 2403* 1691* 988*   BNP: BNP (last 3 results) No results for input(s): BNP in the last 8760 hours.  ProBNP (last 3 results) No results for input(s): PROBNP in the last 8760 hours.  CBG: No results for input(s): GLUCAP in the last 168 hours.     SignedHosie Poisson MD.  [facility] 08/27/2015, 9:14 PM

## 2015-08-27 NOTE — Care Management Note (Signed)
Case Management Note  Patient Details  Name: Daniel Gonzalez MRN: [id number] Date of Birth: 1956/01/06  Subjective/Objective:            Date- 08-27-15 Initial Assessment Spoke with patient at the bedside.  Introduced self as Tourist information centre manager and explained role in discharge planning and how to be reached.  Verified patient lives in [place] alone  Verified patient anticipates to go home alone at time of discharge. Patient has  No DME.  Patient denied needing help with their medication.  Patient drives to MD appointments.  Verified patient has PCP [place].  Plan: CM will continue to follow for discharge planning and [facility] resources.   Carles Collet RN BSN CM [phone]          Action/Plan:  No CM needs identified. Expected Discharge Date:                  Expected Discharge Plan:  Home/Self Care  In-House Referral:     Discharge planning Services  CM Consult  Post Acute Care Choice:    Choice offered to:     DME Arranged:    DME Agency:     HH Arranged:    [place] Agency:     Status of Service:  Completed, signed off  Medicare Important Message Given:    Date Medicare IM Given:    Medicare IM give by:    Date Additional Medicare IM Given:    Additional Medicare Important Message give by:     If discussed at [place] of Stay Meetings, dates discussed:    Additional Comments:  Carles Collet, RN 08/27/2015, 11:29 AM

## 2015-08-29 ENCOUNTER — Ambulatory Visit (INDEPENDENT_AMBULATORY_CARE_PROVIDER_SITE_OTHER): Payer: BLUE CROSS/BLUE SHIELD | Admitting: Family Medicine

## 2015-08-29 ENCOUNTER — Encounter: Payer: Self-pay | Admitting: Family Medicine

## 2015-08-29 VITALS — BP 125/72 | HR 60 | Temp 98.0°F | Ht 66.0 in | Wt 127.2 lb

## 2015-08-29 DIAGNOSIS — M6282 Rhabdomyolysis: Secondary | ICD-10-CM | POA: Diagnosis not present

## 2015-08-29 LAB — BASIC METABOLIC PANEL
BUN: 9 mg/dL (ref 6–23)
CALCIUM: 9.2 mg/dL (ref 8.4–10.5)
CHLORIDE: 104 meq/L (ref 96–112)
CO2: 28 meq/L (ref 19–32)
CREATININE: 0.68 mg/dL (ref 0.40–1.50)
GFR: 154.43 mL/min (ref >60.00)
Glucose, Bld: 79 mg/dL (ref 70–99)
Potassium: 3.9 mEq/L (ref 3.5–5.1)
Sodium: 139 mEq/L (ref 135–145)

## 2015-08-29 LAB — CYTOMEGALOVIRUS ANTIBODY, IGG: Cytomegalovirus Ab-IgG: >10 U/mL — ABNORMAL HIGH (ref <0.60)

## 2015-08-29 LAB — CK: Total CK: 377 U/L — ABNORMAL HIGH (ref 7–232)

## 2015-08-29 LAB — CMV IGM: CMV IgM: <8 AU/mL (ref <30.00)

## 2015-08-29 NOTE — Progress Notes (Signed)
   Subjective:    Patient ID: Daniel Gonzalez, male    DOB: Jan 10, 1956, 60 y.o.   MRN: [id number]  HPI Hospital f/u- pt was hospitalized 1/6-9 w/ post-viral rhabdo.  Pt reports he is feeling much better.  Denies N/V.  Diarrhea has resolved.  Pt reports eyes are no longer hurting and leg pains are much improved.  No obvious cause of rhabdo during hospitalization, thought is post-viral syndrome.   Review of Systems For ROS see HPI     Objective:   Physical Exam  Constitutional: He is oriented to person, place, and time. He appears well-developed. No distress.  HENT:  Head: Normocephalic and atraumatic.  Eyes: Conjunctivae and EOM are normal. Pupils are equal, round, and reactive to light.  Neck: Normal range of motion. Neck supple.  Cardiovascular: Normal rate, regular rhythm, normal heart sounds and intact distal pulses.   Pulmonary/Chest: Effort normal and breath sounds normal. No respiratory distress. He has no wheezes. He has no rales.  Abdominal: Soft. Bowel sounds are normal. He exhibits no distension. There is no tenderness. There is no rebound and no guarding.  Lymphadenopathy:    He has no cervical adenopathy.  Neurological: He is alert and oriented to person, place, and time. No cranial nerve deficit. Coordination normal.  Skin: Skin is warm and dry.  Psychiatric: He has a normal mood and affect. His behavior is normal. Thought content normal.  Vitals reviewed.         Assessment & Plan:

## 2015-08-29 NOTE — Assessment & Plan Note (Signed)
Noted on labs done last week.  Pt was hospitalized 1/6-1/9.  Has been feeling much better since discharge.  No obvious cause for his rhabdo, it was most likely post-viral.  Repeat CK level and BMP today.  Stressed need for continued fluids.  Will follow.

## 2015-08-29 NOTE — Patient Instructions (Signed)
Follow up as needed We'll notify you of your lab results and make any changes if needed Make sure you are drinking plenty of fluids Call with any questions or concerns Hang in there!!!

## 2015-08-29 NOTE — Progress Notes (Signed)
Pre visit review using our clinic review tool, if applicable. No additional management support is needed unless otherwise documented below in the visit note. 

## 2015-08-30 LAB — CULTURE, BLOOD (ROUTINE X 2)
CULTURE: NO GROWTH
Culture: NO GROWTH

## 2015-10-25 ENCOUNTER — Other Ambulatory Visit: Payer: Self-pay | Admitting: General Practice

## 2015-10-25 ENCOUNTER — Other Ambulatory Visit: Payer: Self-pay | Admitting: Family Medicine

## 2015-10-25 MED ORDER — TRAZODONE HCL 50 MG PO TABS: 25.0000 mg | ORAL_TABLET | Freq: Every evening | ORAL | Status: DC | PRN

## 2015-10-25 MED ORDER — DIOVAN 320 MG PO TABS: ORAL_TABLET | ORAL | Status: DC

## 2015-10-25 MED ORDER — AMLODIPINE BESYLATE 5 MG PO TABS: ORAL_TABLET | ORAL | Status: DC

## 2015-10-25 MED ORDER — NEBIVOLOL HCL 10 MG PO TABS: ORAL_TABLET | ORAL | Status: DC

## 2015-10-25 MED ORDER — CLOPIDOGREL BISULFATE 75 MG PO TABS: ORAL_TABLET | ORAL | Status: DC

## 2015-10-25 NOTE — Telephone Encounter (Signed)
Medication filled to pharmacy as requested.   

## 2015-12-14 ENCOUNTER — Encounter: Payer: Self-pay | Admitting: Family Medicine

## 2015-12-14 ENCOUNTER — Ambulatory Visit (INDEPENDENT_AMBULATORY_CARE_PROVIDER_SITE_OTHER): Payer: BLUE CROSS/BLUE SHIELD | Admitting: Family Medicine

## 2015-12-14 ENCOUNTER — Other Ambulatory Visit: Payer: Self-pay | Admitting: Family Medicine

## 2015-12-14 VITALS — BP 130/84 | HR 68 | Temp 98.1°F | Resp 16 | Ht 66.0 in | Wt 127.1 lb

## 2015-12-14 DIAGNOSIS — Z23 Encounter for immunization: Secondary | ICD-10-CM | POA: Diagnosis not present

## 2015-12-14 DIAGNOSIS — D649 Anemia, unspecified: Secondary | ICD-10-CM | POA: Diagnosis not present

## 2015-12-14 DIAGNOSIS — Z0000 Encounter for general adult medical examination without abnormal findings: Secondary | ICD-10-CM

## 2015-12-14 LAB — CBC WITH DIFFERENTIAL/PLATELET
BASOS PCT: 0 %
Basophils Absolute: 0 cells/uL (ref 0–200)
EOS ABS: 70 {cells}/uL (ref 15–500)
EOS PCT: 2 %
HCT: 27.6 % — ABNORMAL LOW (ref 38.5–50.0)
Hemoglobin: 9.4 g/dL — ABNORMAL LOW (ref 13.2–17.1)
LYMPHS PCT: 71 %
Lymphs Abs: 2485 cells/uL (ref 850–3900)
MCH: 38.5 pg — ABNORMAL HIGH (ref 27.0–33.0)
MCHC: 34.1 g/dL (ref 32.0–36.0)
MCV: 113.1 fL — AB (ref 80.0–100.0)
MONOS PCT: 10 %
MPV: 8.9 fL (ref 7.5–12.5)
Monocytes Absolute: 350 cells/uL (ref 200–950)
Neutro Abs: 595 cells/uL — ABNORMAL LOW (ref 1500–7800)
Neutrophils Relative %: 17 %
PLATELETS: 312 10*3/uL (ref 140–400)
RBC: 2.44 MIL/uL — AB (ref 4.20–5.80)
RDW: 15.6 % — AB (ref 11.0–15.0)
WBC: 3.5 10*3/uL — AB (ref 3.8–10.8)

## 2015-12-14 LAB — HEPATIC FUNCTION PANEL
ALBUMIN: 3.7 g/dL (ref 3.6–5.1)
ALT: 9 U/L (ref 9–46)
AST: 11 U/L (ref 10–35)
Alkaline Phosphatase: 85 U/L (ref 40–115)
Bilirubin, Direct: 0.1 mg/dL (ref <=0.2)
Indirect Bilirubin: 0.1 mg/dL — ABNORMAL LOW (ref 0.2–1.2)
TOTAL PROTEIN: 6.8 g/dL (ref 6.1–8.1)
Total Bilirubin: 0.2 mg/dL (ref 0.2–1.2)

## 2015-12-14 LAB — BASIC METABOLIC PANEL
BUN: 14 mg/dL (ref 7–25)
CALCIUM: 9 mg/dL (ref 8.6–10.3)
CO2: 27 mmol/L (ref 20–31)
Chloride: 106 mmol/L (ref 98–110)
Creat: 0.89 mg/dL (ref 0.70–1.33)
GLUCOSE: 76 mg/dL (ref 65–99)
Potassium: 3.9 mmol/L (ref 3.5–5.3)
Sodium: 139 mmol/L (ref 135–146)

## 2015-12-14 LAB — LIPID PANEL
Cholesterol: 153 mg/dL (ref 125–200)
HDL: 35 mg/dL — AB (ref >=40)
LDL Cholesterol: 94 mg/dL (ref <130)
TRIGLYCERIDES: 118 mg/dL (ref <150)
Total CHOL/HDL Ratio: 4.4 Ratio (ref <=5.0)
VLDL: 24 mg/dL (ref <30)

## 2015-12-14 LAB — TSH: TSH: 1.63 mIU/L (ref 0.40–4.50)

## 2015-12-14 NOTE — Progress Notes (Signed)
Pre visit review using our clinic review tool, if applicable. No additional management support is needed unless otherwise documented below in the visit note. 

## 2015-12-14 NOTE — Assessment & Plan Note (Signed)
Pt's PE WNL.  Tdap updated.  Pt is requesting cologuard rather than colonoscopy due to fact he is not able to miss work.  Encouraged him to stop smoking.  Check labs.  Anticipatory guidance provided.

## 2015-12-14 NOTE — Progress Notes (Signed)
   Subjective:    Patient ID: Daniel Gonzalez, male    DOB: 1956-03-05, 60 y.o.   MRN: [id number]  HPI CPE- due for Tdap.  Due for colonoscopy- pt opts for cologuard.     Review of Systems Patient reports no vision/hearing changes, anorexia, fever ,adenopathy, persistant/recurrent hoarseness, swallowing issues, chest pain, palpitations, edema, persistant/recurrent cough, hemoptysis, dyspnea (rest,exertional, paroxysmal nocturnal), gastrointestinal  bleeding (melena, rectal bleeding), abdominal pain, excessive heart burn, GU symptoms (dysuria, hematuria, voiding/incontinence issues) syncope, focal weakness, memory loss, numbness & tingling, skin/hair/nail changes, depression, anxiety, abnormal bruising/bleeding, musculoskeletal symptoms/signs.     Objective:   Physical Exam General Appearance:    Alert, cooperative, no distress, appears stated age  Head:    Normocephalic, without obvious abnormality, atraumatic  Eyes:    PERRL, conjunctiva/corneas clear, EOM's intact, fundi    benign, both eyes       Ears:    Normal TM's and external ear canals, both ears  Nose:   Nares normal, septum midline, mucosa normal, no drainage   or sinus tenderness  Throat:   Lips, mucosa, and tongue normal; teeth and gums normal  Neck:   Supple, symmetrical, trachea midline, no adenopathy;       thyroid:  No enlargement/tenderness/nodules  Back:     Symmetric, no curvature, ROM normal, no CVA tenderness  Lungs:     Clear to auscultation bilaterally, respirations unlabored  Chest wall:    No tenderness or deformity  Heart:    Regular rate and rhythm, S1 and S2 normal, no murmur, rub   or gallop  Abdomen:     Soft, non-tender, bowel sounds active all four quadrants,    no masses, no organomegaly  Genitalia:    Deferred at pt's request  Rectal:    Extremities:   Extremities normal, atraumatic, no cyanosis or edema  Pulses:   2+ and symmetric all extremities  Skin:   Skin color, texture, turgor normal, no  rashes or lesions  Lymph nodes:   Cervical, supraclavicular, and axillary nodes normal  Neurologic:   CNII-XII intact. Normal strength, sensation and reflexes      throughout          Assessment & Plan:

## 2015-12-14 NOTE — Addendum Note (Signed)
Addended by: Davis Gourd on: 12/14/2015 04:16 PM   Modules accepted: Orders

## 2015-12-14 NOTE — Patient Instructions (Signed)
Follow up in 6 months to recheck BP We'll notify you of your lab results and make any changes if needed Complete the cologuard and return it as directed Try and quit smoking!!! Call with any questions or concerns Thanks for sticking with [place]! Happy Spring!!!

## 2015-12-15 ENCOUNTER — Other Ambulatory Visit: Payer: Self-pay | Admitting: Family Medicine

## 2015-12-15 LAB — PSA: PSA: 3.27 ng/mL (ref <=4.00)

## 2015-12-17 LAB — VITAMIN B12: VITAMIN B 12: 220 pg/mL (ref 200–1100)

## 2015-12-17 LAB — PATHOLOGIST SMEAR REVIEW

## 2015-12-17 LAB — FOLATE: Folate: 4.6 ng/mL — ABNORMAL LOW (ref >5.4)

## 2015-12-17 NOTE — Telephone Encounter (Signed)
Medication filled to pharmacy as requested.   

## 2015-12-19 ENCOUNTER — Other Ambulatory Visit: Payer: Self-pay | Admitting: Family Medicine

## 2015-12-19 NOTE — Telephone Encounter (Signed)
Medication filled to pharmacy as requested.   

## 2016-01-02 ENCOUNTER — Other Ambulatory Visit: Payer: Self-pay | Admitting: *Deleted

## 2016-01-02 DIAGNOSIS — B20 Human immunodeficiency virus [HIV] disease: Secondary | ICD-10-CM

## 2016-01-02 MED ORDER — ZIDOVUDINE 300 MG PO TABS: 300.0000 mg | ORAL_TABLET | Freq: Two times a day (BID) | ORAL | Status: DC

## 2016-01-18 ENCOUNTER — Other Ambulatory Visit: Payer: Self-pay | Admitting: *Deleted

## 2016-01-18 DIAGNOSIS — B20 Human immunodeficiency virus [HIV] disease: Secondary | ICD-10-CM

## 2016-01-18 MED ORDER — EMTRICITAB-RILPIVIR-TENOFOV AF 200-25-25 MG PO TABS: 1.0000 | ORAL_TABLET | Freq: Every day | ORAL | Status: DC

## 2016-01-18 NOTE — Telephone Encounter (Signed)
Pt needing follow-up appt.  Made appt for 03/04/16 @ 4:15 PM with Dr. Megan Salon.

## 2016-02-20 ENCOUNTER — Other Ambulatory Visit: Payer: Self-pay | Admitting: Family Medicine

## 2016-02-20 NOTE — Telephone Encounter (Signed)
Medication filled to pharmacy as requested.   

## 2016-03-04 ENCOUNTER — Encounter: Payer: Self-pay | Admitting: Internal Medicine

## 2016-03-04 ENCOUNTER — Ambulatory Visit (INDEPENDENT_AMBULATORY_CARE_PROVIDER_SITE_OTHER): Payer: BLUE CROSS/BLUE SHIELD | Admitting: Internal Medicine

## 2016-03-04 VITALS — BP 161/97 | HR 57 | Temp 98.0°F | Wt 128.2 lb

## 2016-03-04 DIAGNOSIS — B20 Human immunodeficiency virus [HIV] disease: Secondary | ICD-10-CM

## 2016-03-04 DIAGNOSIS — I10 Essential (primary) hypertension: Secondary | ICD-10-CM

## 2016-03-04 NOTE — Assessment & Plan Note (Signed)
His adherence remains excellent. His infection has been under very good control but I will need to repeat lab work today. He will continue his current antiretroviral regimen and follow-up in 6 months.

## 2016-03-04 NOTE — Assessment & Plan Note (Signed)
His blood pressure is not at goal today. He remained elevated after repeating it before he left. He does have a blood pressure cuff at home but does not use it on a regular basis. I asked him to monitor his blood pressure daily at different times a day to see if it is generally too high.

## 2016-03-04 NOTE — Progress Notes (Signed)
Patient Active Problem List   Diagnosis Date Noted  . Cerebral aneurysm, nonruptured 09/02/2013    Priority: High  . Acute CVA (cerebrovascular accident) ([place]) 06/22/2013    Priority: High  . Essential hypertension 05/01/2010    Priority: High  . CIGARETTE SMOKER 09/17/2006    Priority: High  . Human immunodeficiency virus (HIV) disease ([place]) 09/17/2006    Priority: Medium  . Protein-calorie malnutrition, severe 08/25/2015  . Myalgia and myositis 08/24/2015  . Nausea with vomiting 08/24/2015  . Rhabdomyolysis 08/24/2015  . Acute viral syndrome 08/24/2015  . Leukopenia 08/24/2015  . Anemia 08/24/2015  . Hyponatremia 08/24/2015  . Nausea and vomiting 08/24/2015  . HIV disease ([place])   . Night sweats 08/15/2015  . Itching 08/05/2013  . Visual disturbance 06/22/2013  . Hypopigmentation 06/20/2013  . Hematuria, gross 02/11/2013  . Routine general medical examination at a health care facility 01/07/2013  . Allergic rhinitis 09/22/2012  . Migraine headache 05/01/2010  . CARPAL TUNNEL SYNDROME 01/02/2010  . HEADACHE, CHRONIC, HX OF 01/02/2009  . LOSS OF APPETITE 10/03/2008  . PILONIDAL CYST WITHOUT MENTION OF ABSCESS 05/29/2008  . GENITAL HERPES 09/17/2006  . ERECTILE DYSFUNCTION 09/17/2006  . 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 PNEUMOCOCCAL PNEUMONIA 09/17/2006  . DIVERTICULOSIS, COLON W/O HEM 09/17/2006  . BOILS, RECURRENT 09/17/2006  . FRACTURE, FINGER 09/17/2006    Patient's Medications  New Prescriptions   No medications on file  Previous Medications   ACETAMINOPHEN (TYLENOL) 500 MG TABLET    Take 1,000 mg by mouth every 6 (six) hours as needed for moderate pain.    AMLODIPINE (NORVASC) 5 MG TABLET    TAKE 1 TABLET(5 MG) BY MOUTH DAILY   BYSTOLIC 10 MG TABLET    TAKE 1 TABLET(10 MG) BY MOUTH DAILY   CLOPIDOGREL (PLAVIX) 75 MG TABLET    TAKE 1 TABLET BY MOUTH EVERY DAY WITH BREAKFAST   DIOVAN 320 MG TABLET    TAKE 1 TABLET(320 MG) BY MOUTH DAILY   EMTRICITABINE-RILPIVIR-TENOFOVIR AF (ODEFSEY) 200-25-25 MG TABS TABLET    Take 1 tablet by mouth daily.   ONDANSETRON (ZOFRAN) 4 MG TABLET    Take 1 tablet (4 mg total) by mouth every 8 (eight) hours as needed for nausea or vomiting.   POLYETHYL GLYCOL-PROPYL GLYCOL (SYSTANE) 0.4-0.3 % SOLN    Apply 1 drop to eye daily as needed (for dry eyes).   SILDENAFIL (VIAGRA) 100 MG TABLET    Take 100 mg by mouth daily as needed for erectile dysfunction.   TRAZODONE (DESYREL) 50 MG TABLET    Take 0.5-1 tablets (25-50 mg total) by mouth at bedtime as needed for sleep.   ZIDOVUDINE (RETROVIR) 300 MG TABLET    Take 1 tablet (300 mg total) by mouth 2 (two) times daily.  Modified Medications   No medications on file  Discontinued Medications   No medications on file    Subjective: Osmel is in for his routine follow-up visit. He has had no problems obtaining, taking or tolerating his Odefsey and Retrovir. He does not recall missing any doses. He has no current health concerns.   Review of Systems: Review of Systems  Constitutional: Negative for fever, chills, weight loss, malaise/fatigue and diaphoresis.  HENT: Negative for sore throat.   Respiratory: Negative for cough, sputum production and shortness of breath.   Cardiovascular: Negative for chest pain.  Gastrointestinal: Negative for nausea, vomiting and diarrhea.  Genitourinary: Negative for dysuria and frequency.  Musculoskeletal: Negative for myalgias and joint pain.  Skin: Negative for rash.  Neurological: Negative for dizziness and headaches.  Psychiatric/Behavioral: Negative for depression and substance abuse. The patient is not nervous/anxious.     Past Medical History  Diagnosis Date  . Cluster headache   . Blood in stool   . Migraine   . Frequent episodic tension-type headache   . Hypertension   . HIV infection ([place])   . Pneumonia   . Stroke [facility])     Social History  Substance Use Topics  . Smoking status: Current Every Day  Smoker -- 0.50 packs/day for 35 years    Types: Cigarettes  . Smokeless tobacco: Never Used     Comment: slowing down  . Alcohol Use: 3.6 oz/week    6 Standard drinks or equivalent per week     Comment: no fun drinking by yourself    Family History  Problem Relation Age of Onset  . Arthritis Mother   . Heart disease Mother   . Hypertension Mother   . Diabetes Mother   . Arthritis Father   . Heart disease Father   . Hypertension Father   . Colon cancer Neg Hx   . Lung cancer Brother     Allergies  Allergen Reactions  . Asa [Aspirin] Palpitations    Speeds up heart rate  . Sulfamethoxazole-Trimethoprim Itching    Objective:  Filed Vitals:   03/04/16 1626 03/04/16 1704  BP: 166/92 161/97  Pulse: 56 57  Temp: 98 F (36.7 C)   TempSrc: Oral   Weight: 128 lb 4 oz (58.174 kg)    Body mass index is 20.71 kg/(m^2).  Physical Exam  Constitutional: He is oriented to person, place, and time.  HENT:  Mouth/Throat: No oropharyngeal exudate.  Eyes: Conjunctivae are normal.  Cardiovascular: Normal rate and regular rhythm.   No murmur heard. Pulmonary/Chest: Breath sounds normal.  Abdominal: Soft. He exhibits no mass. There is no tenderness.  Musculoskeletal: Normal range of motion.  Neurological: He is alert and oriented to person, place, and time.  Skin: No rash noted.  Psychiatric: Mood and affect normal.    Lab Results Lab Results  Component Value Date   WBC 3.5* 12/14/2015   HGB 9.4* 12/14/2015   HCT 27.6* 12/14/2015   MCV 113.1* 12/14/2015   PLT 312 12/14/2015    Lab Results  Component Value Date   CREATININE 0.89 12/14/2015   BUN 14 12/14/2015   NA 139 12/14/2015   K 3.9 12/14/2015   CL 106 12/14/2015   CO2 27 12/14/2015    Lab Results  Component Value Date   ALT 9 12/14/2015   AST 11 12/14/2015   ALKPHOS 85 12/14/2015   BILITOT 0.2 12/14/2015    Lab Results  Component Value Date   CHOL 153 12/14/2015   HDL 35* 12/14/2015   LDLCALC 94  12/14/2015   TRIG 118 12/14/2015   CHOLHDL 4.4 12/14/2015   HIV 1 RNA QUANT (copies/mL)  Date Value  05/23/2015 <20  11/23/2014 <20  05/05/2014 <20   CD4 T CELL ABS (/uL)  Date Value  05/23/2015 970  11/23/2014 1050  05/05/2014 900     Problem List Items Addressed This Visit      High   Essential hypertension - Primary    His blood pressure is not at goal today. He remained elevated after repeating it before he left. He does have a blood pressure cuff at home but does not use it on a  regular basis. I asked him to monitor his blood pressure daily at different times a day to see if it is generally too high.        Medium   Human immunodeficiency virus (HIV) disease ([place])    His adherence remains excellent. His infection has been under very good control but I will need to repeat lab work today. He will continue his current antiretroviral regimen and follow-up in 6 months.      Relevant Orders   T-helper cell (CD4)- ([facility] clinic only)   HIV 1 RNA quant-no reflex-bld   CBC   Comprehensive metabolic panel   RPR   Lipid panel        Michel Bickers, MD [facility] for [place] [phone] pager   [phone] cell 03/04/2016, 5:14 PM

## 2016-03-06 LAB — T-HELPER CELL (CD4) - (RCID CLINIC ONLY)
CD4 T CELL ABS: 980 /uL (ref 400–2700)
CD4 T CELL HELPER: 35 % (ref 33–55)

## 2016-03-07 LAB — HIV-1 RNA QUANT-NO REFLEX-BLD: HIV 1 RNA Quant: <20 copies/mL (ref <20)

## 2016-03-24 ENCOUNTER — Other Ambulatory Visit: Payer: Self-pay | Admitting: Family Medicine

## 2016-05-30 ENCOUNTER — Other Ambulatory Visit: Payer: BLUE CROSS/BLUE SHIELD

## 2016-05-30 DIAGNOSIS — B20 Human immunodeficiency virus [HIV] disease: Secondary | ICD-10-CM

## 2016-05-30 LAB — CBC
HEMATOCRIT: 35 % — AB (ref 38.5–50.0)
HEMOGLOBIN: 12 g/dL — AB (ref 13.2–17.1)
MCH: 39.6 pg — AB (ref 27.0–33.0)
MCHC: 34.3 g/dL (ref 32.0–36.0)
MCV: 115.5 fL — AB (ref 80.0–100.0)
MPV: 8.7 fL (ref 7.5–12.5)
Platelets: 217 10*3/uL (ref 140–400)
RBC: 3.03 MIL/uL — ABNORMAL LOW (ref 4.20–5.80)
RDW: 14.8 % (ref 11.0–15.0)
WBC: 5.3 10*3/uL (ref 3.8–10.8)

## 2016-05-30 LAB — COMPREHENSIVE METABOLIC PANEL
ALBUMIN: 4.1 g/dL (ref 3.6–5.1)
ALT: 9 U/L (ref 9–46)
AST: 15 U/L (ref 10–35)
Alkaline Phosphatase: 65 U/L (ref 40–115)
BUN: 11 mg/dL (ref 7–25)
CALCIUM: 9.3 mg/dL (ref 8.6–10.3)
CHLORIDE: 106 mmol/L (ref 98–110)
CO2: 21 mmol/L (ref 20–31)
Creat: 0.91 mg/dL (ref 0.70–1.33)
Glucose, Bld: 91 mg/dL (ref 65–99)
Potassium: 4.4 mmol/L (ref 3.5–5.3)
Sodium: 138 mmol/L (ref 135–146)
Total Bilirubin: 0.4 mg/dL (ref 0.2–1.2)
Total Protein: 6.9 g/dL (ref 6.1–8.1)

## 2016-05-30 LAB — LIPID PANEL
Cholesterol: 133 mg/dL (ref 125–200)
HDL: 48 mg/dL (ref >=40)
LDL CALC: 66 mg/dL (ref <130)
TRIGLYCERIDES: 95 mg/dL (ref <150)
Total CHOL/HDL Ratio: 2.8 Ratio (ref <=5.0)
VLDL: 19 mg/dL (ref <30)

## 2016-05-30 LAB — T-HELPER CELL (CD4) - (RCID CLINIC ONLY)
CD4 % Helper T Cell: 24 % — ABNORMAL LOW (ref 33–55)
CD4 T Cell Abs: 700 /uL (ref 400–2700)

## 2016-05-31 LAB — RPR

## 2016-06-03 LAB — HIV-1 RNA QUANT-NO REFLEX-BLD
HIV 1 RNA Quant: <20 copies/mL (ref <20)
HIV-1 RNA Quant, Log: <1.3 Log copies/mL (ref <1.30)

## 2016-06-08 ENCOUNTER — Other Ambulatory Visit: Payer: Self-pay | Admitting: Family Medicine

## 2016-06-17 ENCOUNTER — Ambulatory Visit (INDEPENDENT_AMBULATORY_CARE_PROVIDER_SITE_OTHER): Payer: BLUE CROSS/BLUE SHIELD | Admitting: Internal Medicine

## 2016-06-17 ENCOUNTER — Encounter: Payer: Self-pay | Admitting: Internal Medicine

## 2016-06-17 DIAGNOSIS — Z23 Encounter for immunization: Secondary | ICD-10-CM

## 2016-06-17 DIAGNOSIS — I10 Essential (primary) hypertension: Secondary | ICD-10-CM | POA: Diagnosis not present

## 2016-06-17 DIAGNOSIS — F17200 Nicotine dependence, unspecified, uncomplicated: Secondary | ICD-10-CM | POA: Diagnosis not present

## 2016-06-17 DIAGNOSIS — B20 Human immunodeficiency virus [HIV] disease: Secondary | ICD-10-CM

## 2016-06-17 NOTE — Assessment & Plan Note (Signed)
Kashis's HIV infection remains under excellent, long-term control. He will continue his current antiretroviral regimen. He received his influenza vaccination today. He will follow-up here after blood work in 6 months.

## 2016-06-17 NOTE — Assessment & Plan Note (Signed)
His blood pressure remains above goal and has been the past few visits here. He states that he is taking his blood pressure medication. He will be following up with his primary care provider later this week.

## 2016-06-17 NOTE — Progress Notes (Signed)
Patient Active Problem List   Diagnosis Date Noted  . Cerebral aneurysm, nonruptured 09/02/2013    Priority: High  . Acute CVA (cerebrovascular accident) ([place]) 06/22/2013    Priority: High  . Essential hypertension 05/01/2010    Priority: High  . CIGARETTE SMOKER 09/17/2006    Priority: High  . Human immunodeficiency virus (HIV) disease ([place]) 09/17/2006    Priority: Medium  . Protein-calorie malnutrition, severe 08/25/2015  . Myalgia and myositis 08/24/2015  . Nausea with vomiting 08/24/2015  . Rhabdomyolysis 08/24/2015  . Acute viral syndrome 08/24/2015  . Leukopenia 08/24/2015  . Anemia 08/24/2015  . Hyponatremia 08/24/2015  . Nausea and vomiting 08/24/2015  . HIV disease ([place])   . Night sweats 08/15/2015  . Itching 08/05/2013  . Visual disturbance 06/22/2013  . Hypopigmentation 06/20/2013  . Hematuria, gross 02/11/2013  . Routine general medical examination at a health care facility 01/07/2013  . Allergic rhinitis 09/22/2012  . Migraine headache 05/01/2010  . CARPAL TUNNEL SYNDROME 01/02/2010  . HEADACHE, CHRONIC, HX OF 01/02/2009  . LOSS OF APPETITE 10/03/2008  . PILONIDAL CYST WITHOUT MENTION OF ABSCESS 05/29/2008  . GENITAL HERPES 09/17/2006  . ERECTILE DYSFUNCTION 09/17/2006  . 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 PNEUMOCOCCAL PNEUMONIA 09/17/2006  . DIVERTICULOSIS, COLON W/O HEM 09/17/2006  . BOILS, RECURRENT 09/17/2006  . FRACTURE, FINGER 09/17/2006    Patient's Medications  New Prescriptions   No medications on file  Previous Medications   ACETAMINOPHEN (TYLENOL) 500 MG TABLET    Take 1,000 mg by mouth every 6 (six) hours as needed for moderate pain.    AMLODIPINE (NORVASC) 5 MG TABLET    TAKE 1 TABLET(5 MG) BY MOUTH DAILY   BYSTOLIC 10 MG TABLET    TAKE 1 TABLET(10 MG) BY MOUTH DAILY   CLOPIDOGREL (PLAVIX) 75 MG TABLET    TAKE 1 TABLET BY MOUTH EVERY DAY WITH BREAKFAST   CLOPIDOGREL (PLAVIX) 75 MG TABLET    TAKE 1 TABLET BY MOUTH EVERY  DAY WITH BREAKFAST   DIOVAN 320 MG TABLET    TAKE 1 TABLET(320 MG) BY MOUTH DAILY   EMTRICITABINE-RILPIVIR-TENOFOVIR AF (ODEFSEY) 200-25-25 MG TABS TABLET    Take 1 tablet by mouth daily.   ONDANSETRON (ZOFRAN) 4 MG TABLET    Take 1 tablet (4 mg total) by mouth every 8 (eight) hours as needed for nausea or vomiting.   POLYETHYL GLYCOL-PROPYL GLYCOL (SYSTANE) 0.4-0.3 % SOLN    Apply 1 drop to eye daily as needed (for dry eyes).   SILDENAFIL (VIAGRA) 100 MG TABLET    Take 100 mg by mouth daily as needed for erectile dysfunction.   TRAZODONE (DESYREL) 50 MG TABLET    TAKE 1/2 TO 1 TABLET(25 TO 50 MG) BY MOUTH AT BEDTIME AS NEEDED FOR SLEEP   ZIDOVUDINE (RETROVIR) 300 MG TABLET    Take 1 tablet (300 mg total) by mouth 2 (two) times daily.  Modified Medications   No medications on file  Discontinued Medications   No medications on file    Subjective: Daniel Gonzalez is in for his routine HIV follow-up visit. He continues to take his Retrovir and Odefsey without difficulty. He has had some problems with his mail order pharmacy and that they have gotten his 2 medications out of sync. Fortunately this has not led to any missed doses. However, he is very worried about his high deductible that will kick back in on January 1. He is afraid that he will  not be able to afford his medications. Otherwise, he is feeling well and has no current health concerns. He is still smoking cigarettes and has no current plan to quit. He does monitor his blood pressure at home and states that it is sometimes high, sometimes low and sometimes in between.   Review of Systems: Review of Systems  Constitutional: Negative for chills, diaphoresis, fever, malaise/fatigue and weight loss.  HENT: Negative for sore throat.   Respiratory: Negative for cough, sputum production and shortness of breath.   Cardiovascular: Negative for chest pain.  Gastrointestinal: Negative for abdominal pain, diarrhea, nausea and vomiting.  Genitourinary:  Negative for dysuria and frequency.       He has recently been bothered by some terminal dribbling when he passes his urine.  Musculoskeletal: Negative for joint pain and myalgias.  Skin: Negative for rash.  Neurological: Negative for dizziness and headaches.  Psychiatric/Behavioral: Negative for depression and substance abuse. The patient is not nervous/anxious.     Past Medical History:  Diagnosis Date  . Blood in stool   . Cluster headache   . Frequent episodic tension-type headache   . HIV infection ([place])   . Hypertension   . Migraine   . Pneumonia   . Stroke [facility])     Social History  Substance Use Topics  . Smoking status: Current Every Day Smoker    Packs/day: 0.50    Years: 35.00    Types: Cigarettes  . Smokeless tobacco: Never Used     Comment: slowing down  . Alcohol use 3.6 oz/week    6 Standard drinks or equivalent per week     Comment: no fun drinking by yourself    Family History  Problem Relation Age of Onset  . Arthritis Mother   . Heart disease Mother   . Hypertension Mother   . Diabetes Mother   . Arthritis Father   . Heart disease Father   . Hypertension Father   . Lung cancer Brother   . Colon cancer Neg Hx     Allergies  Allergen Reactions  . Asa [Aspirin] Palpitations    Speeds up heart rate  . Sulfamethoxazole-Trimethoprim Itching    Objective:  Vitals:   06/17/16 1543  BP: (!) 152/92  Pulse: 61  Temp: 98.1 F (36.7 C)  TempSrc: Oral  Weight: 127 lb 8 oz (57.8 kg)   Body mass index is 20.58 kg/m.  Physical Exam  Constitutional: He is oriented to person, place, and time.  He is talkative and in good spirits.  HENT:  Mouth/Throat: No oropharyngeal exudate.  Eyes: Conjunctivae are normal.  Cardiovascular: Normal rate and regular rhythm.   No murmur heard. Pulmonary/Chest: Effort normal and breath sounds normal. He has no wheezes. He has no rales.  Abdominal: Soft. He exhibits no mass. There is no tenderness.  He says he  has noticed a "knot" in his upper abdomen recently. When he points out what he notices I think he is feeling his xiphoid process.  Musculoskeletal: Normal range of motion. He exhibits no edema or tenderness.  Neurological: He is alert and oriented to person, place, and time.  Skin: No rash noted.  Psychiatric: Mood and affect normal.    Lab Results Lab Results  Component Value Date   WBC 5.3 05/30/2016   HGB 12.0 (L) 05/30/2016   HCT 35.0 (L) 05/30/2016   MCV 115.5 (H) 05/30/2016   PLT 217 05/30/2016    Lab Results  Component Value Date   CREATININE  0.91 05/30/2016   BUN 11 05/30/2016   NA 138 05/30/2016   K 4.4 05/30/2016   CL 106 05/30/2016   CO2 21 05/30/2016    Lab Results  Component Value Date   ALT 9 05/30/2016   AST 15 05/30/2016   ALKPHOS 65 05/30/2016   BILITOT 0.4 05/30/2016    Lab Results  Component Value Date   CHOL 133 05/30/2016   HDL 48 05/30/2016   LDLCALC 66 05/30/2016   TRIG 95 05/30/2016   CHOLHDL 2.8 05/30/2016   HIV 1 RNA Quant (copies/mL)  Date Value  05/30/2016 <20  03/04/2016 <20  05/23/2015 <20   CD4 T Cell Abs (/uL)  Date Value  05/30/2016 700  03/04/2016 980  05/23/2015 970     Problem List Items Addressed This Visit      High   CIGARETTE SMOKER    I talked to him again about the most importance of cigarette cessation, particularly in light of his previous stroke and ongoing risk for a repeat and more devastating stroke.      Essential hypertension    His blood pressure remains above goal and has been the past few visits here. He states that he is taking his blood pressure medication. He will be following up with his primary care provider later this week.        Medium   Human immunodeficiency virus (HIV) disease ([place])    Jemarcus's HIV infection remains under excellent, long-term control. He will continue his current antiretroviral regimen. He received his influenza vaccination today. He will follow-up here after blood work  in 6 months.      Relevant Orders   T-helper cell (CD4)- ([facility] clinic only)   HIV 1 RNA quant-no reflex-bld   CBC   Comprehensive metabolic panel   Lipid panel   RPR    Other Visit Diagnoses    Encounter for immunization       Relevant Orders   Flu Vaccine QUAD 36+ mos IM (Completed)        Daniel Bickers, Daniel Gonzalez [facility] [place] [facility] [phone] pager   [phone] cell 06/17/2016, 4:21 PM

## 2016-06-17 NOTE — Assessment & Plan Note (Signed)
I talked to him again about the most importance of cigarette cessation, particularly in light of his previous stroke and ongoing risk for a repeat and more devastating stroke.

## 2016-06-20 ENCOUNTER — Encounter: Payer: Self-pay | Admitting: Family Medicine

## 2016-06-20 ENCOUNTER — Ambulatory Visit (INDEPENDENT_AMBULATORY_CARE_PROVIDER_SITE_OTHER): Payer: BLUE CROSS/BLUE SHIELD | Admitting: Family Medicine

## 2016-06-20 VITALS — BP 152/80 | HR 60 | Temp 98.3°F | Resp 14 | Ht 66.0 in | Wt 126.0 lb

## 2016-06-20 DIAGNOSIS — I10 Essential (primary) hypertension: Secondary | ICD-10-CM

## 2016-06-20 MED ORDER — AMLODIPINE BESYLATE 10 MG PO TABS: 10.0000 mg | ORAL_TABLET | Freq: Every day | ORAL | 3 refills | Status: DC

## 2016-06-20 NOTE — Progress Notes (Signed)
Pre visit review using our clinic review tool, if applicable. No additional management support is needed unless otherwise documented below in the visit note. 

## 2016-06-20 NOTE — Patient Instructions (Signed)
Follow up in 1 month to recheck BP Increase the Amlodipine to 10mg  daily- 2 of what you have at home (Amlodipine 5mg ) and 1 of the new prescription (10mg ) Continue the Bystolic and Valsartan daily Try and cut back on smoking!!  You can do it! Call with any questions or concerns Happy Thanksgiving!!!

## 2016-06-20 NOTE — Progress Notes (Signed)
   Subjective:    Patient ID: Daniel Gonzalez, male    DOB: 1956/01/22, 60 y.o.   MRN: [id number]  HPI HTN- chronic problem.  BP is elevated today.  On Norvasc 5mg  daily, Bystolic 10mg  daily, Diovan 320mg  daily.  Pt continues to smoke- both cigarettes and marijuana.  No CP, SOB above baseline, HAs, visual, edema.  Recent BMP shows normal renal fxn.     Review of Systems For ROS see HPI     Objective:   Physical Exam  Constitutional: He is oriented to person, place, and time. He appears well-developed and well-nourished. No distress.  HENT:  Head: Normocephalic and atraumatic.  Eyes: Conjunctivae and EOM are normal. Pupils are equal, round, and reactive to light.  Neck: Normal range of motion. Neck supple. No thyromegaly present.  Cardiovascular: Normal rate, regular rhythm, normal heart sounds and intact distal pulses.   No murmur heard. Pulmonary/Chest: Effort normal and breath sounds normal. No respiratory distress.  Abdominal: Soft. Bowel sounds are normal. He exhibits no distension.  Musculoskeletal: He exhibits no edema.  Lymphadenopathy:    He has no cervical adenopathy.  Neurological: He is alert and oriented to person, place, and time. No cranial nerve deficit.  Skin: Skin is warm and dry.  Psychiatric: He has a normal mood and affect. His behavior is normal.  Vitals reviewed.         Assessment & Plan:

## 2016-06-20 NOTE — Assessment & Plan Note (Signed)
Deteriorated.  Pt's BP is higher than goal and despite him saying he is taking all 3 BP meds, he seems confused as to what the Amlodipine is.  Will increase Amlodipine to 10mg  daily and monitor closely for improvement.  Reviewed recent labs done at [facility]- W[facility].  Stressed need for smoking cessation.  Pt states, 'that's not going to happen'.  Will continue to address at future visits.

## 2016-06-26 ENCOUNTER — Other Ambulatory Visit: Payer: Self-pay | Admitting: Family Medicine

## 2016-07-22 ENCOUNTER — Other Ambulatory Visit: Payer: Self-pay | Admitting: Family Medicine

## 2016-07-25 ENCOUNTER — Ambulatory Visit (INDEPENDENT_AMBULATORY_CARE_PROVIDER_SITE_OTHER): Payer: BLUE CROSS/BLUE SHIELD | Admitting: Family Medicine

## 2016-07-25 ENCOUNTER — Encounter: Payer: Self-pay | Admitting: Family Medicine

## 2016-07-25 VITALS — BP 130/81 | HR 57 | Temp 98.0°F | Resp 16 | Ht 66.0 in | Wt 128.0 lb

## 2016-07-25 DIAGNOSIS — I10 Essential (primary) hypertension: Secondary | ICD-10-CM | POA: Diagnosis not present

## 2016-07-25 NOTE — Progress Notes (Signed)
   Subjective:    Patient ID: Daniel Gonzalez, male    DOB: Oct 17, 1955, 60 y.o.   MRN: [id number]  HPI HTN- chronic problem.  Pt's Amlodipine was increased to 10mg  at last visit.  Remains on Diovan and Bystolic.  No CP, SOB above baseline, HAs, swelling of hands/feet.  Not exercising.   Review of Systems For ROS see HPI     Objective:   Physical Exam  Constitutional: He is oriented to person, place, and time. He appears well-developed and well-nourished. No distress.  HENT:  Head: Normocephalic and atraumatic.  Eyes: Conjunctivae and EOM are normal. Pupils are equal, round, and reactive to light.  Neck: Normal range of motion. Neck supple. No thyromegaly present.  Cardiovascular: Normal rate, regular rhythm, normal heart sounds and intact distal pulses.   No murmur heard. Pulmonary/Chest: Effort normal and breath sounds normal. No respiratory distress.  Abdominal: Soft. Bowel sounds are normal. He exhibits no distension.  Musculoskeletal: He exhibits no edema.  Lymphadenopathy:    He has no cervical adenopathy.  Neurological: He is alert and oriented to person, place, and time. No cranial nerve deficit.  Skin: Skin is warm and dry.  Psychiatric: He has a normal mood and affect. His behavior is normal.  Vitals reviewed.         Assessment & Plan:

## 2016-07-25 NOTE — Assessment & Plan Note (Signed)
BP is much better today.  No med changes at this time.  Reviewed need to stop smoking, start exercising and continue a low salt diet.  Will continue to follow.

## 2016-07-25 NOTE — Progress Notes (Signed)
Pre visit review using our clinic review tool, if applicable. No additional management support is needed unless otherwise documented below in the visit note. 

## 2016-07-25 NOTE — Patient Instructions (Signed)
Schedule your complete physical for early May Your blood pressure looks MUCH better!  I'm so glad! Continue the Amlodipine, Valsartan, Bystolic daily Continue to eat a low salt diet Call with any questions or concerns Happy Holidays!!!

## 2016-07-31 ENCOUNTER — Other Ambulatory Visit: Payer: Self-pay | Admitting: Family Medicine

## 2016-08-05 ENCOUNTER — Other Ambulatory Visit: Payer: Self-pay | Admitting: Internal Medicine

## 2016-08-05 DIAGNOSIS — B20 Human immunodeficiency virus [HIV] disease: Secondary | ICD-10-CM

## 2016-08-05 MED ORDER — EMTRICITAB-RILPIVIR-TENOFOV AF 200-25-25 MG PO TABS: 1.0000 | ORAL_TABLET | Freq: Every day | ORAL | 5 refills | Status: DC

## 2016-08-22 DIAGNOSIS — H40013 Open angle with borderline findings, low risk, bilateral: Secondary | ICD-10-CM | POA: Diagnosis not present

## 2016-09-02 ENCOUNTER — Telehealth: Payer: Self-pay | Admitting: Family Medicine

## 2016-09-02 ENCOUNTER — Other Ambulatory Visit: Payer: Self-pay | Admitting: Emergency Medicine

## 2016-09-02 MED ORDER — METOPROLOL SUCCINATE ER 50 MG PO TB24: 50.0000 mg | ORAL_TABLET | Freq: Every day | ORAL | 3 refills | Status: DC

## 2016-09-02 NOTE — Telephone Encounter (Signed)
Pt is on 3 BP meds and 2 (amlodipine and Valsartan) are generic.  The only one that's not is the Bystolic.  We can switch to Metoprolol XR 50mg  daily

## 2016-09-02 NOTE — Telephone Encounter (Signed)
Called pt and LMOVM to return call.  °

## 2016-09-02 NOTE — Telephone Encounter (Signed)
Pt returned call, Please call back.

## 2016-09-02 NOTE — Telephone Encounter (Signed)
Advised patient he is already taking generic medications and the Bystolic is brand. He is agreaable with switching to Metoprolol XR 50 mg daily. Sent rx to the pharmacy

## 2016-09-02 NOTE — Telephone Encounter (Signed)
Patient calling to requesting to speak with Dr. Virgil Benedict CMA, I advised pt she was with patients and offered to send a message.  Pt reports he is taking 4 different blood pressure meds and he cannot afford to pay for them as he has not met his deductible yet for this year.  He wants to know if he can be put on generic medications to help with the cost or is pcp aware of any programs he could enroll in to help pay for the cost of these meds.

## 2016-09-23 ENCOUNTER — Other Ambulatory Visit: Payer: Self-pay | Admitting: Family Medicine

## 2016-10-09 ENCOUNTER — Other Ambulatory Visit: Payer: Self-pay | Admitting: Family Medicine

## 2016-10-29 ENCOUNTER — Other Ambulatory Visit: Payer: Self-pay | Admitting: Family Medicine

## 2016-12-04 ENCOUNTER — Other Ambulatory Visit: Payer: Self-pay | Admitting: Family Medicine

## 2016-12-05 ENCOUNTER — Other Ambulatory Visit: Payer: BLUE CROSS/BLUE SHIELD

## 2016-12-05 DIAGNOSIS — B20 Human immunodeficiency virus [HIV] disease: Secondary | ICD-10-CM

## 2016-12-05 LAB — CBC
HCT: 33.1 % — ABNORMAL LOW (ref 38.5–50.0)
Hemoglobin: 11.4 g/dL — ABNORMAL LOW (ref 13.2–17.1)
MCH: 40 pg — ABNORMAL HIGH (ref 27.0–33.0)
MCHC: 34.4 g/dL (ref 32.0–36.0)
MCV: 116.1 fL — ABNORMAL HIGH (ref 80.0–100.0)
MPV: 9 fL (ref 7.5–12.5)
PLATELETS: 252 10*3/uL (ref 140–400)
RBC: 2.85 MIL/uL — AB (ref 4.20–5.80)
RDW: 15.1 % — ABNORMAL HIGH (ref 11.0–15.0)
WBC: 3.8 10*3/uL (ref 3.8–10.8)

## 2016-12-05 LAB — COMPREHENSIVE METABOLIC PANEL
ALBUMIN: 4.1 g/dL (ref 3.6–5.1)
ALT: 9 U/L (ref 9–46)
AST: 16 U/L (ref 10–35)
Alkaline Phosphatase: 67 U/L (ref 40–115)
BILIRUBIN TOTAL: 0.4 mg/dL (ref 0.2–1.2)
BUN: 9 mg/dL (ref 7–25)
CO2: 23 mmol/L (ref 20–31)
CREATININE: 0.84 mg/dL (ref 0.70–1.33)
Calcium: 9.2 mg/dL (ref 8.6–10.3)
Chloride: 109 mmol/L (ref 98–110)
Glucose, Bld: 98 mg/dL (ref 65–99)
Potassium: 4 mmol/L (ref 3.5–5.3)
SODIUM: 139 mmol/L (ref 135–146)
TOTAL PROTEIN: 7 g/dL (ref 6.1–8.1)

## 2016-12-05 LAB — LIPID PANEL
CHOLESTEROL: 141 mg/dL (ref <200)
HDL: 41 mg/dL (ref >40)
LDL CALC: 88 mg/dL (ref <100)
TRIGLYCERIDES: 62 mg/dL (ref <150)
Total CHOL/HDL Ratio: 3.4 Ratio (ref <5.0)
VLDL: 12 mg/dL (ref <30)

## 2016-12-05 LAB — T-HELPER CELL (CD4) - (RCID CLINIC ONLY)
CD4 % Helper T Cell: 32 % — ABNORMAL LOW (ref 33–55)
CD4 T CELL ABS: 940 /uL (ref 400–2700)

## 2016-12-08 LAB — HIV-1 RNA QUANT-NO REFLEX-BLD
HIV 1 RNA Quant: <20 copies/mL — AB
HIV-1 RNA Quant, Log: <1.3 Log copies/mL — AB

## 2016-12-15 ENCOUNTER — Ambulatory Visit (INDEPENDENT_AMBULATORY_CARE_PROVIDER_SITE_OTHER): Payer: BLUE CROSS/BLUE SHIELD | Admitting: Internal Medicine

## 2016-12-15 ENCOUNTER — Encounter: Payer: Self-pay | Admitting: Internal Medicine

## 2016-12-15 DIAGNOSIS — I10 Essential (primary) hypertension: Secondary | ICD-10-CM

## 2016-12-15 DIAGNOSIS — R634 Abnormal weight loss: Secondary | ICD-10-CM | POA: Diagnosis not present

## 2016-12-15 DIAGNOSIS — F17200 Nicotine dependence, unspecified, uncomplicated: Secondary | ICD-10-CM

## 2016-12-15 DIAGNOSIS — B20 Human immunodeficiency virus [HIV] disease: Secondary | ICD-10-CM

## 2016-12-15 NOTE — Assessment & Plan Note (Signed)
His antihypertensive regimen was changed recently and his blood pressure is under better control.

## 2016-12-15 NOTE — Assessment & Plan Note (Signed)
He seems to believe that his recent unintentional weight loss is due to not having enough time to eat his meals. I asked him to snack more frequently at work and make sure that he takes the time to finish his meals.

## 2016-12-15 NOTE — Progress Notes (Signed)
Patient Active Problem List   Diagnosis Date Noted  . Cerebral aneurysm, nonruptured 09/02/2013    Priority: High  . History of CVA (cerebrovascular accident) 06/22/2013    Priority: High  . Essential hypertension 05/01/2010    Priority: High  . CIGARETTE SMOKER 09/17/2006    Priority: High  . Human immunodeficiency virus (HIV) disease ([place]) 09/17/2006    Priority: Medium  . Unintentional weight loss 12/15/2016  . Anemia 08/24/2015  . Visual disturbance 06/22/2013  . Hypopigmentation 06/20/2013  . Hematuria, gross 02/11/2013  . Routine general medical examination at a health care facility 01/07/2013  . CARPAL TUNNEL SYNDROME 01/02/2010  . HEADACHE, CHRONIC, HX OF 01/02/2009  . PILONIDAL CYST WITHOUT MENTION OF ABSCESS 05/29/2008  . GENITAL HERPES 09/17/2006  . ERECTILE DYSFUNCTION 09/17/2006  . 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 PNEUMOCOCCAL PNEUMONIA 09/17/2006  . DIVERTICULOSIS, COLON W/O HEM 09/17/2006  . BOILS, RECURRENT 09/17/2006  . FRACTURE, FINGER 09/17/2006    Patient's Medications  New Prescriptions   No medications on file  Previous Medications   ACETAMINOPHEN (TYLENOL) 500 MG TABLET    Take 1,000 mg by mouth every 6 (six) hours as needed for moderate pain.    AMLODIPINE (NORVASC) 10 MG TABLET    TAKE 1 TABLET(10 MG) BY MOUTH DAILY   CLOPIDOGREL (PLAVIX) 75 MG TABLET    TAKE 1 TABLET BY MOUTH EVERY DAY WITH BREAKFAST   DIOVAN 320 MG TABLET    TAKE 1 TABLET BY MOUTH EVERY DAY   EMTRICITABINE-RILPIVIR-TENOFOVIR AF (ODEFSEY) 200-25-25 MG TABS TABLET    Take 1 tablet by mouth daily.   METOPROLOL SUCCINATE (TOPROL-XL) 50 MG 24 HR TABLET    Take 1 tablet (50 mg total) by mouth daily. Take with or immediately following a meal.   ONDANSETRON (ZOFRAN) 4 MG TABLET    Take 1 tablet (4 mg total) by mouth every 8 (eight) hours as needed for nausea or vomiting.   POLYETHYL GLYCOL-PROPYL GLYCOL (SYSTANE) 0.4-0.3 % SOLN    Apply 1 drop to eye daily as needed  (for dry eyes).   SILDENAFIL (VIAGRA) 100 MG TABLET    Take 100 mg by mouth daily as needed for erectile dysfunction.   TRAZODONE (DESYREL) 50 MG TABLET    TAKE 1/2 TO 1 TABLET(25 TO 50 MG) BY MOUTH AT BEDTIME AS NEEDED FOR SLEEP   ZIDOVUDINE (RETROVIR) 300 MG TABLET    TAKE 1 TABLET (300 MG TOTAL) BY MOUTH 2 (TWO) TIMES DAILY  Modified Medications   No medications on file  Discontinued Medications   No medications on file    Subjective: Daniel Gonzalez is in for his routine HIV follow-up visit. He has not had any problems obtaining, taking or tolerating his Odefsey and Retrovir. He keeps him in his backpack and takes them while he is at work. He does not recall missing any doses. He has been a little stressed out because his son who is 59 years old is still living with him. He is not helping pay any bills. He recently sat down with him and told him that he would need to move out and get an apartment on his own. He says that because of the stress he does not feel he can stop smoking cigarettes at this time. He is little concerned about recent weight loss. He tells me that his appetite is good but that he often does not have enough time to finish his meals when he is  at work.   Review of Systems: Review of Systems  Constitutional: Positive for weight loss. Negative for chills, diaphoresis, fever and malaise/fatigue.  HENT: Negative for sore throat.   Respiratory: Negative for cough, sputum production and shortness of breath.   Cardiovascular: Negative for chest pain.  Gastrointestinal: Negative for abdominal pain, diarrhea, heartburn, nausea and vomiting.  Genitourinary: Negative for dysuria and frequency.  Musculoskeletal: Negative for joint pain and myalgias.  Skin: Negative for rash.  Neurological: Negative for dizziness and headaches.  Psychiatric/Behavioral: Negative for depression and substance abuse. The patient is not nervous/anxious.     Past Medical History:  Diagnosis Date  . Blood in  stool   . Cluster headache   . Frequent episodic tension-type headache   . HIV infection ([place])   . Hypertension   . Migraine   . Pneumonia   . Stroke [facility])     Social History  Substance Use Topics  . Smoking status: Current Every Day Smoker    Packs/day: 0.50    Years: 35.00    Types: Cigarettes  . Smokeless tobacco: Never Used     Comment: slowing down  . Alcohol use 3.6 oz/week    6 Standard drinks or equivalent per week     Comment: no fun drinking by yourself    Family History  Problem Relation Age of Onset  . Arthritis Mother   . Heart disease Mother   . Hypertension Mother   . Diabetes Mother   . Arthritis Father   . Heart disease Father   . Hypertension Father   . Lung cancer Brother   . Colon cancer Neg Hx     Allergies  Allergen Reactions  . Asa [Aspirin] Palpitations    Speeds up heart rate  . Sulfamethoxazole-Trimethoprim Itching    Objective:  Vitals:   12/15/16 1114  BP: 133/83  Pulse: (!) 52  Temp: 97.6 F (36.4 C)  TempSrc: Oral  Weight: 122 lb (55.3 kg)  Height: 5\' 6"  (1.676 m)   Body mass index is 19.69 kg/m.  Physical Exam  Constitutional: He is oriented to person, place, and time.  His weight is down 6 pounds.  HENT:  Mouth/Throat: No oropharyngeal exudate.  Eyes: Conjunctivae are normal.  Cardiovascular: Normal rate and regular rhythm.   No murmur heard. Pulmonary/Chest: Effort normal and breath sounds normal.  Abdominal: Soft. He exhibits no mass. There is no tenderness.  Musculoskeletal: Normal range of motion.  Neurological: He is alert and oriented to person, place, and time.  Skin: No rash noted.  Psychiatric: Mood and affect normal.    Lab Results Lab Results  Component Value Date   WBC 3.8 12/05/2016   HGB 11.4 (L) 12/05/2016   HCT 33.1 (L) 12/05/2016   MCV 116.1 (H) 12/05/2016   PLT 252 12/05/2016    Lab Results  Component Value Date   CREATININE 0.84 12/05/2016   BUN 9 12/05/2016   NA 139 12/05/2016    K 4.0 12/05/2016   CL 109 12/05/2016   CO2 23 12/05/2016    Lab Results  Component Value Date   ALT 9 12/05/2016   AST 16 12/05/2016   ALKPHOS 67 12/05/2016   BILITOT 0.4 12/05/2016    Lab Results  Component Value Date   CHOL 141 12/05/2016   HDL 41 12/05/2016   LDLCALC 88 12/05/2016   TRIG 62 12/05/2016   CHOLHDL 3.4 12/05/2016   HIV 1 RNA Quant (copies/mL)  Date Value  12/05/2016 <20 DETECTED (A)  05/30/2016 <20  03/04/2016 <20   CD4 T Cell Abs (/uL)  Date Value  12/05/2016 940  05/30/2016 700  03/04/2016 980     Problem List Items Addressed This Visit      High   CIGARETTE SMOKER    I talked to him again about the importance of cigarette cessation.      Essential hypertension    His antihypertensive regimen was changed recently and his blood pressure is under better control.        Medium   Human immunodeficiency virus (HIV) disease ([place])    His infection is under excellent, long-term control. He will continue Odefsey and Retrovir and follow-up after lab work in 6 months.      Relevant Orders   T-helper cell (CD4)- ([facility] only)   HIV 1 RNA quant-no reflex-bld     Unprioritized   Unintentional weight loss    He seems to believe that his recent unintentional weight loss is due to not having enough time to eat his meals. I asked him to snack more frequently at work and make sure that he takes the time to finish his meals.           Michel Bickers, MD [facility] [place] [facility] [phone] pager   [phone] cell 12/15/2016, 11:39 AM

## 2016-12-15 NOTE — Assessment & Plan Note (Signed)
His infection is under excellent, long-term control. He will continue Odefsey and Retrovir and follow-up after lab work in 6 months.

## 2016-12-15 NOTE — Assessment & Plan Note (Signed)
I talked to him again about the importance of cigarette cessation. 

## 2016-12-21 ENCOUNTER — Other Ambulatory Visit: Payer: Self-pay | Admitting: Family Medicine

## 2017-01-07 ENCOUNTER — Other Ambulatory Visit: Payer: Self-pay | Admitting: Family Medicine

## 2017-01-28 ENCOUNTER — Other Ambulatory Visit: Payer: Self-pay | Admitting: *Deleted

## 2017-01-28 DIAGNOSIS — B20 Human immunodeficiency virus [HIV] disease: Secondary | ICD-10-CM

## 2017-01-28 MED ORDER — EMTRICITAB-RILPIVIR-TENOFOV AF 200-25-25 MG PO TABS: 1.0000 | ORAL_TABLET | Freq: Every day | ORAL | 5 refills | Status: DC

## 2017-01-28 MED ORDER — ZIDOVUDINE 300 MG PO TABS: 300.0000 mg | ORAL_TABLET | Freq: Two times a day (BID) | ORAL | 5 refills | Status: DC

## 2017-01-29 ENCOUNTER — Telehealth: Payer: Self-pay | Admitting: *Deleted

## 2017-01-29 NOTE — Telephone Encounter (Signed)
PA initiated via [email or web address] for odefsey. Landis Gandy, RN

## 2017-01-30 ENCOUNTER — Other Ambulatory Visit: Payer: Self-pay | Admitting: *Deleted

## 2017-01-30 DIAGNOSIS — B20 Human immunodeficiency virus [HIV] disease: Secondary | ICD-10-CM

## 2017-01-30 MED ORDER — ZIDOVUDINE 300 MG PO TABS: 300.0000 mg | ORAL_TABLET | Freq: Two times a day (BID) | ORAL | 5 refills | Status: DC

## 2017-02-04 IMAGING — CT CT HEAD W/O CM
1 series · 16 of 30 positions shown, 20 images · non-contrast
Comparison: CT 06/23/2013

CLINICAL DATA: Nausea vomiting.  HIV positive

EXAM:
CT HEAD WITHOUT CONTRAST
TECHNIQUE: Contiguous axial images were obtained from the base of the skull
through the vertex without intravenous contrast.

[Series 2: head wo · axial · 0.44mm/px · z∈[-151,-11]mm · 16 of 33 slices shown, 20 images]
[im 2/33  brain]
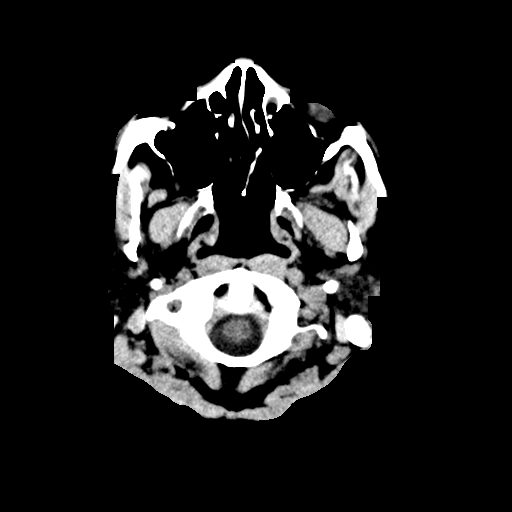
[im 2/33  bone]
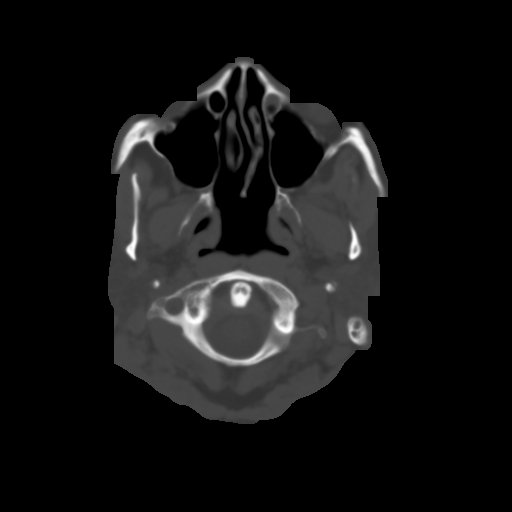
[im 4/33  brain]
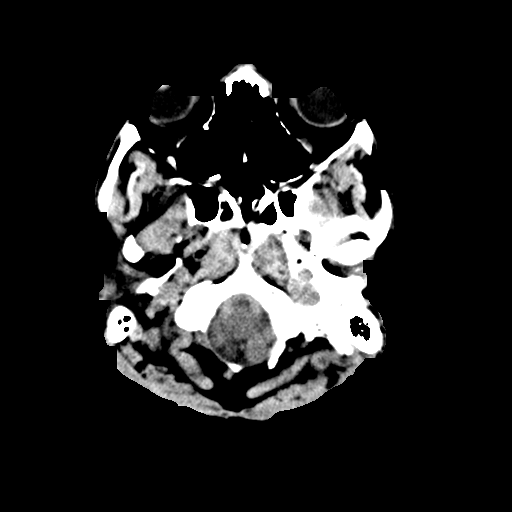
[im 6/33  brain]
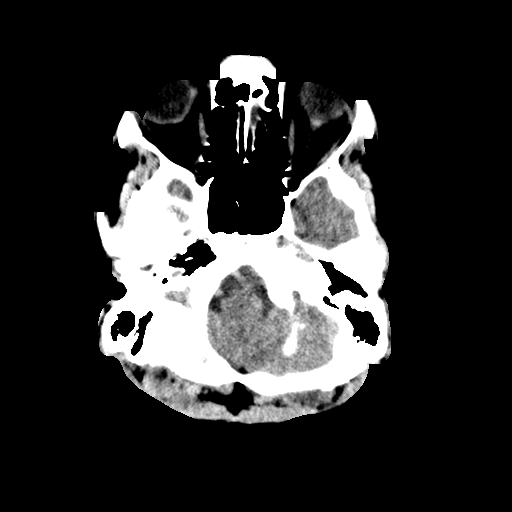
[im 8/33  brain]
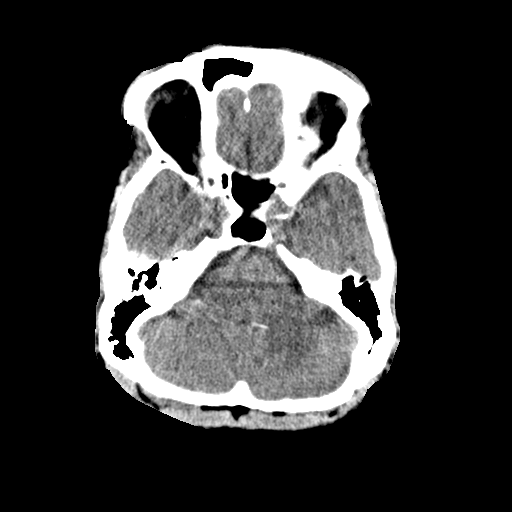
[im 9/33  brain]
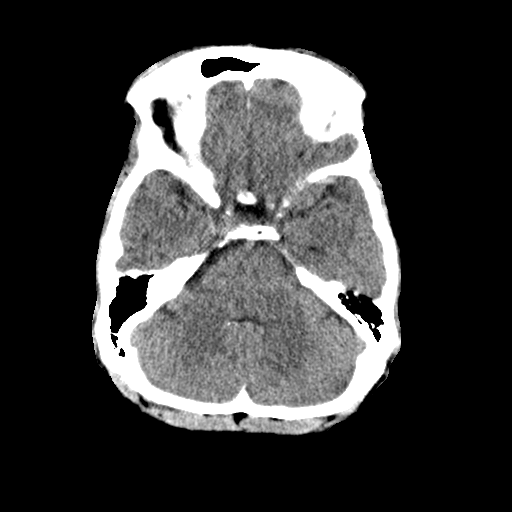
[im 9/33  bone]
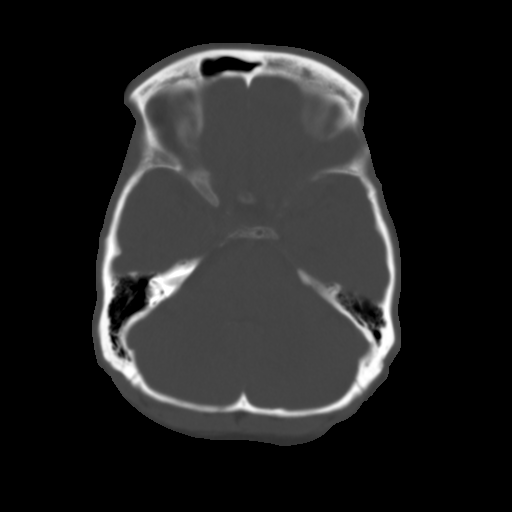
[im 12/33  brain]
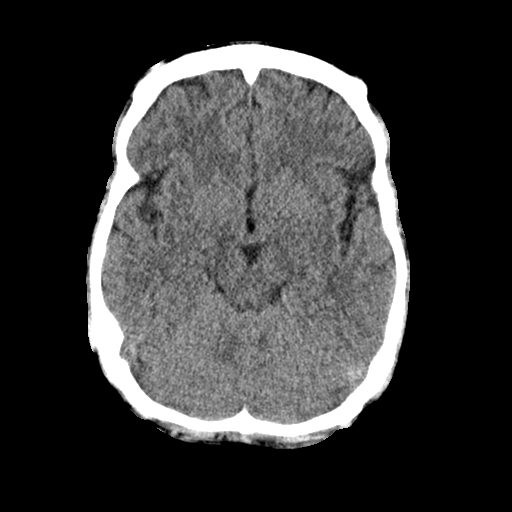
[im 14/33  brain]
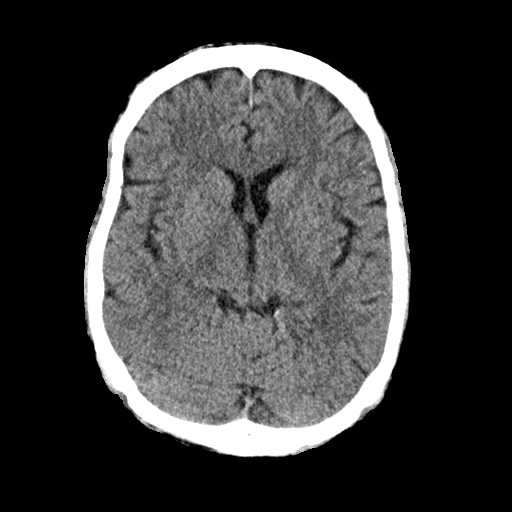
[im 16/33  brain]
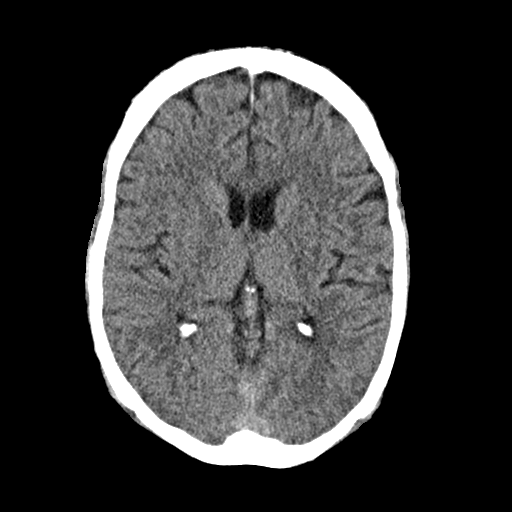
[im 17/33  brain]
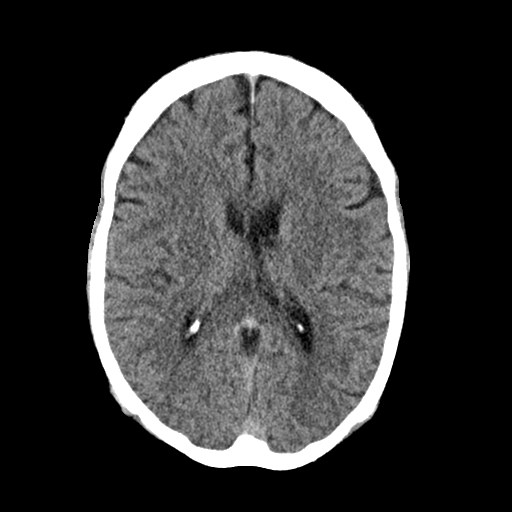
[im 17/33  bone]
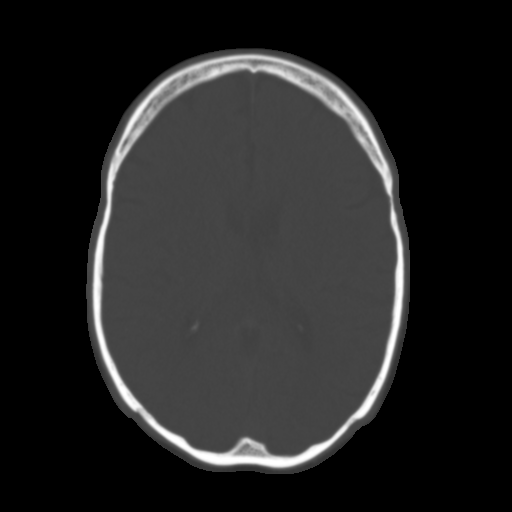
[im 19/33  brain]
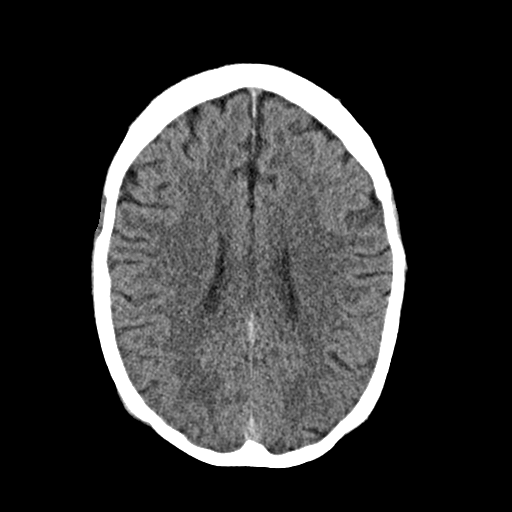
[im 21/33  brain]
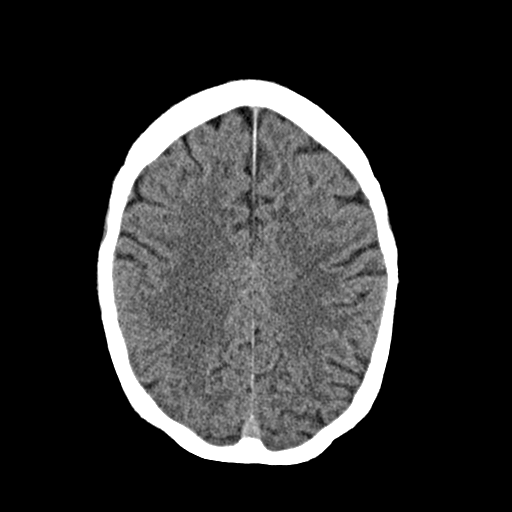
[im 24/33  brain]
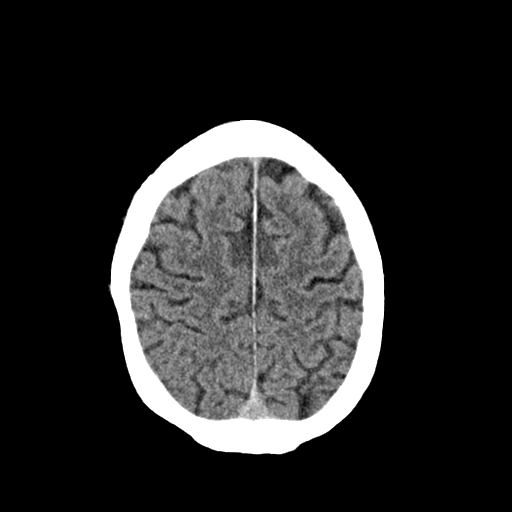
[im 25/33  brain]
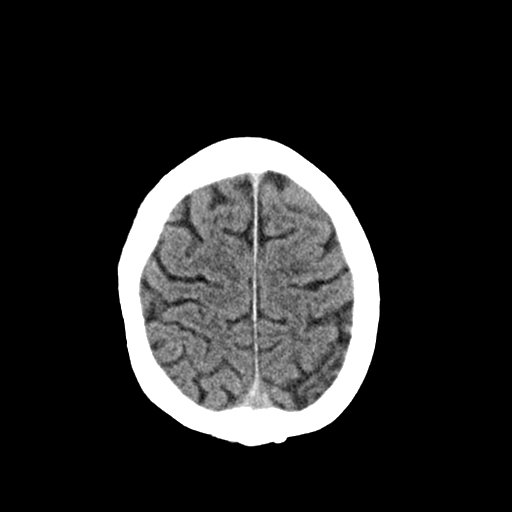
[im 25/33  bone]
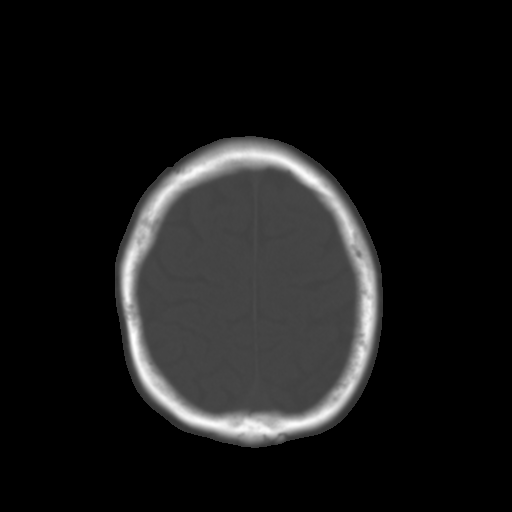
[im 27/33  brain]
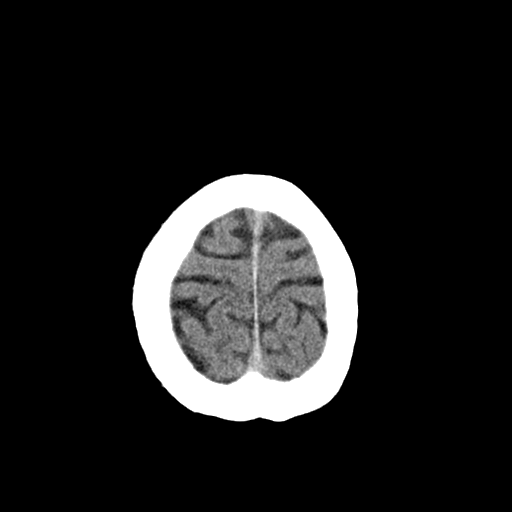
[im 29/33  brain]
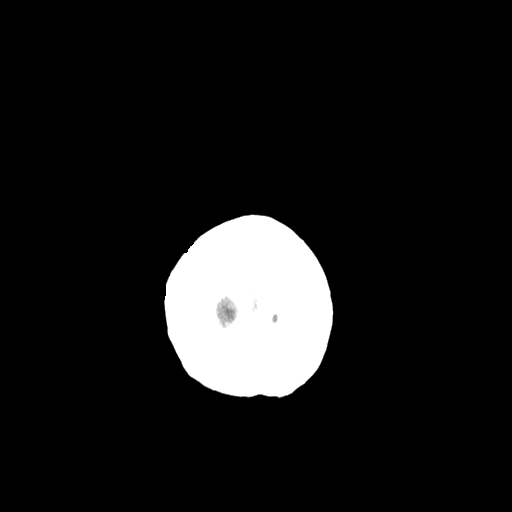
[im 31/33  brain]
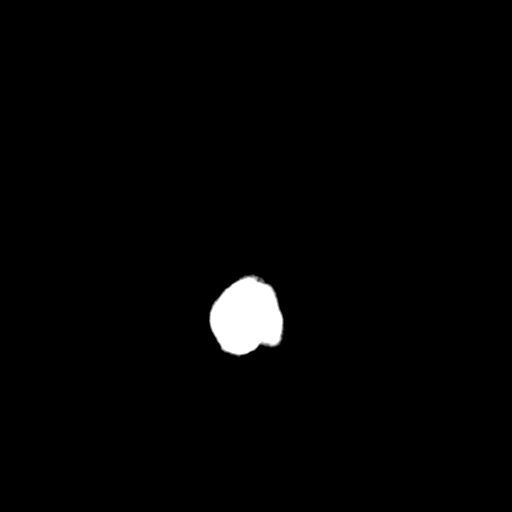

[16 of 30 positions shown; findings below may reference images not displayed]

FINDINGS: Ventricle size is normal. Negative for acute or chronic infarction.
Negative for hemorrhage or fluid collection. Negative for mass or
edema. No shift of the midline structures.

Calvarium is intact.
IMPRESSION: Normal

## 2017-02-04 IMAGING — DX DG CHEST 2V
2 series · 2 of 2 positions shown · non-contrast
Comparison: Chest radiograph August 15, 2015

CLINICAL DATA: Flu-like symptoms. Leg and eye pain. Muscle
weakness. History of migraine, hypertension and HIV.

EXAM:
CHEST  2 VIEW

[chest lat]
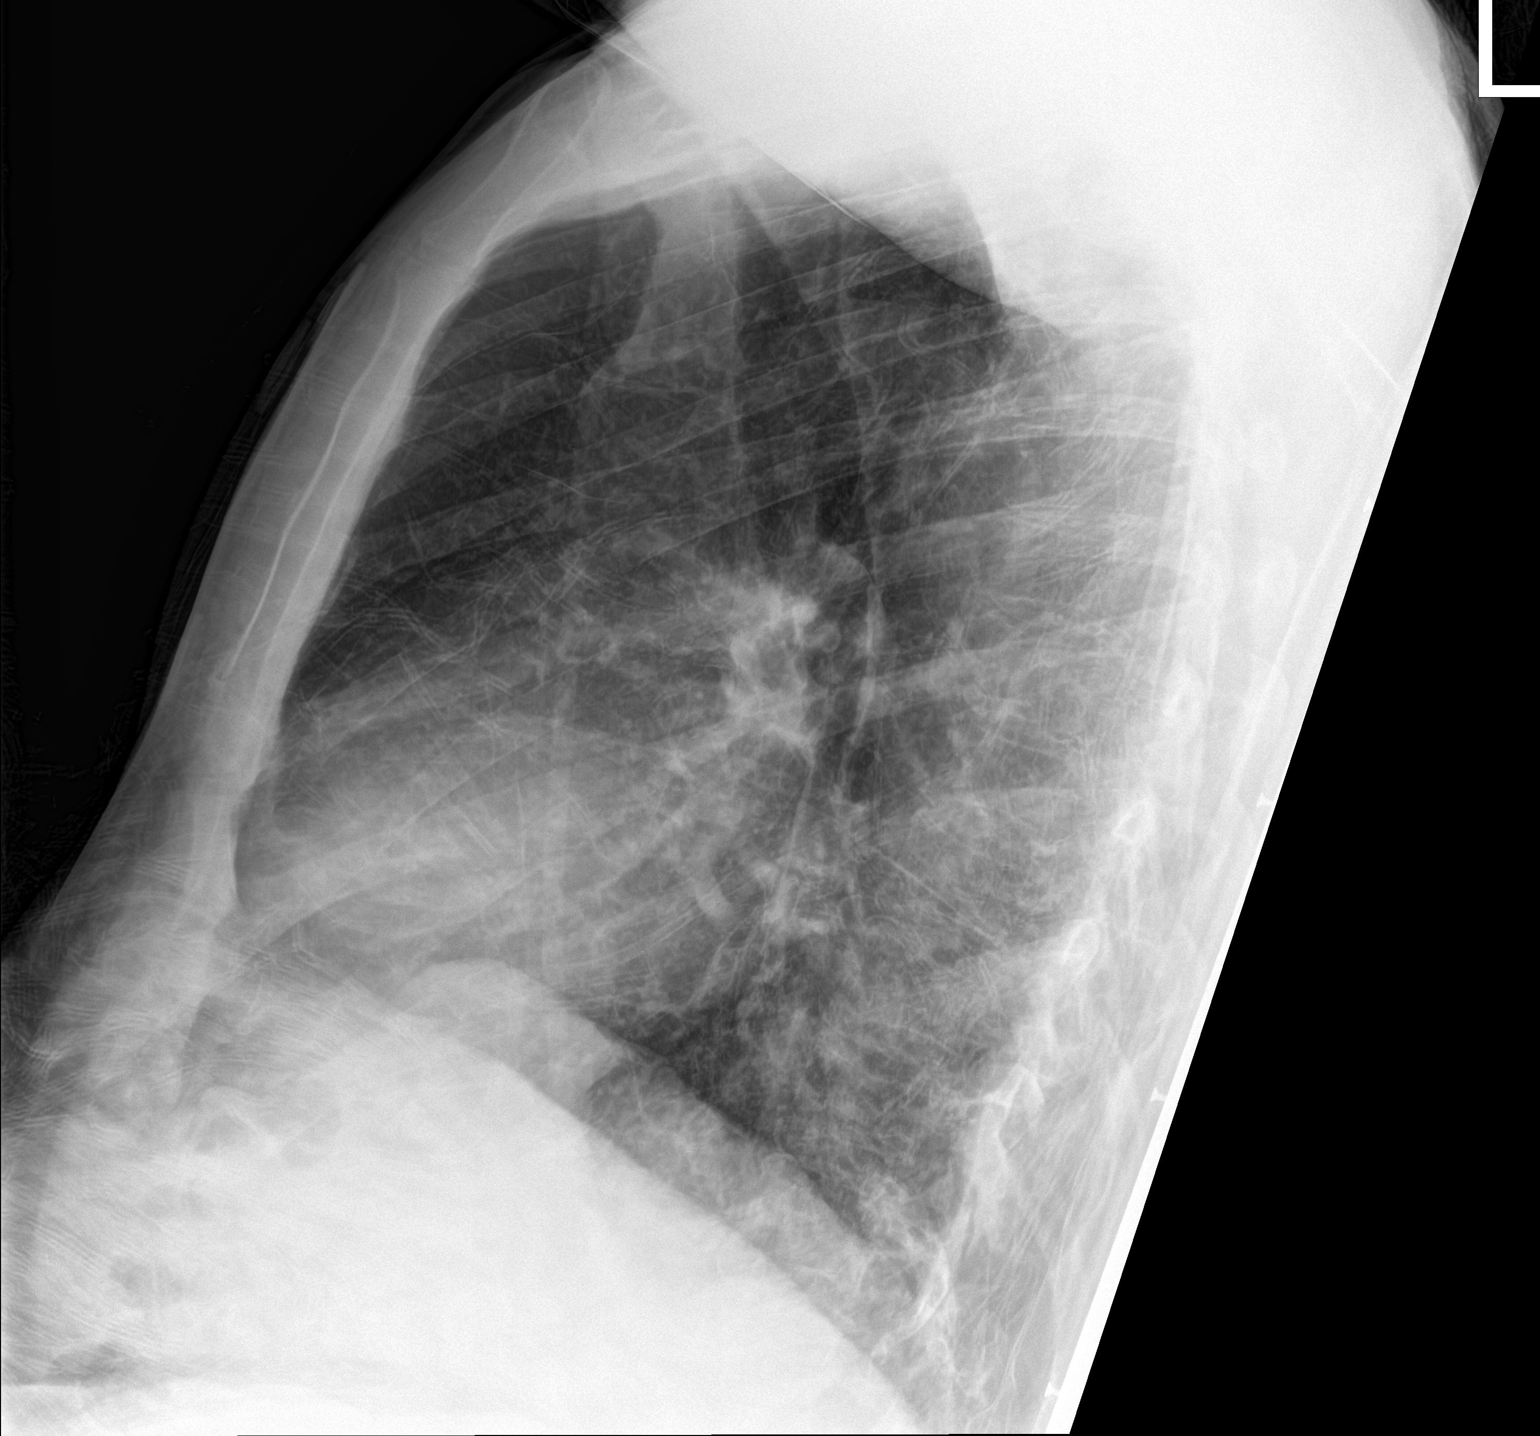

[chest ap]
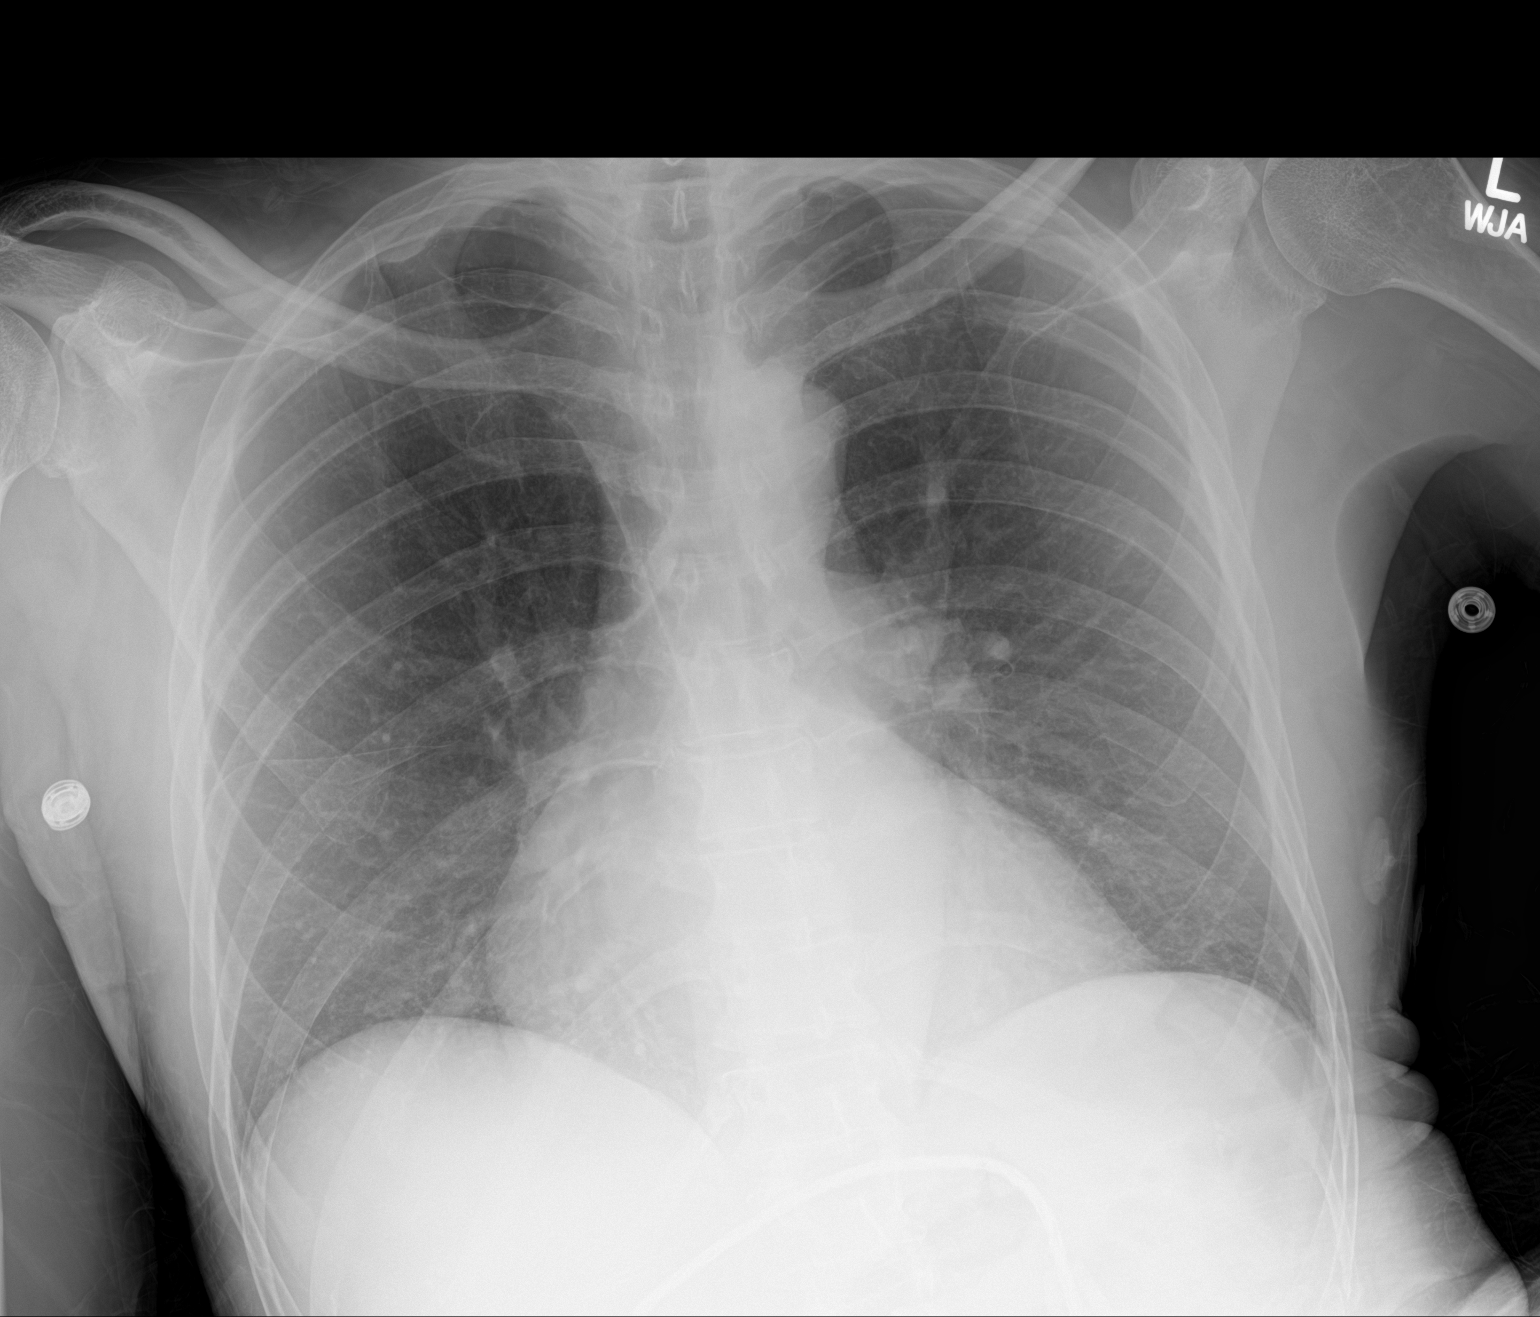

[2 of 2 positions shown; findings below may reference images not displayed]

FINDINGS: Cardiac silhouette is mildly enlarged, mediastinal silhouette is
nonsuspicious. No pleural effusion or focal consolidation. Mild
interstitial changes in the lung bases. Soft tissue planes and
included osseous structures are nonsuspicious.
IMPRESSION: Mild cardiomegaly. Bibasilar interstitial prominence, could
represent atypical infection without focal consolidation.

## 2017-02-06 ENCOUNTER — Other Ambulatory Visit: Payer: Self-pay | Admitting: Family Medicine

## 2017-03-27 ENCOUNTER — Other Ambulatory Visit: Payer: Self-pay | Admitting: Family Medicine

## 2017-03-27 ENCOUNTER — Other Ambulatory Visit: Payer: Self-pay | Admitting: General Practice

## 2017-03-27 MED ORDER — TRAZODONE HCL 50 MG PO TABS: ORAL_TABLET | ORAL | 0 refills | Status: DC

## 2017-04-13 ENCOUNTER — Telehealth: Payer: Self-pay | Admitting: Family Medicine

## 2017-04-13 MED ORDER — OLMESARTAN MEDOXOMIL 40 MG PO TABS: 40.0000 mg | ORAL_TABLET | Freq: Every day | ORAL | 3 refills | Status: DC

## 2017-04-13 NOTE — Telephone Encounter (Signed)
Pt informed on the change in BP meds. Pt was scheduled for a follow up on 04/24/17.

## 2017-04-13 NOTE — Telephone Encounter (Signed)
Please advise? Pt has not been seen since December 2017

## 2017-04-13 NOTE — Telephone Encounter (Signed)
Switch to Benicar 40mg  daily and needs to schedule BP f/u

## 2017-04-13 NOTE — Telephone Encounter (Signed)
Pt states that he was told that there is a recall on his valsartan and that he needs something else called in. [facility] on [address]. Pt wants a call back when this has been done.

## 2017-04-24 ENCOUNTER — Other Ambulatory Visit (INDEPENDENT_AMBULATORY_CARE_PROVIDER_SITE_OTHER): Payer: BLUE CROSS/BLUE SHIELD

## 2017-04-24 ENCOUNTER — Encounter: Payer: Self-pay | Admitting: Family Medicine

## 2017-04-24 ENCOUNTER — Ambulatory Visit (INDEPENDENT_AMBULATORY_CARE_PROVIDER_SITE_OTHER)
Admission: RE | Admit: 2017-04-24 | Discharge: 2017-04-24 | Disposition: A | Discharge status: Home / Self Care | Payer: BLUE CROSS/BLUE SHIELD | Source: Ambulatory Visit | Attending: Family Medicine | Admitting: Family Medicine

## 2017-04-24 ENCOUNTER — Ambulatory Visit (INDEPENDENT_AMBULATORY_CARE_PROVIDER_SITE_OTHER): Payer: BLUE CROSS/BLUE SHIELD | Admitting: Family Medicine

## 2017-04-24 VITALS — BP 112/72 | HR 68 | Temp 98.1°F | Resp 16 | Ht 66.0 in | Wt 116.1 lb

## 2017-04-24 DIAGNOSIS — B20 Human immunodeficiency virus [HIV] disease: Secondary | ICD-10-CM | POA: Diagnosis not present

## 2017-04-24 DIAGNOSIS — Z23 Encounter for immunization: Secondary | ICD-10-CM

## 2017-04-24 DIAGNOSIS — J439 Emphysema, unspecified: Secondary | ICD-10-CM | POA: Diagnosis not present

## 2017-04-24 DIAGNOSIS — I10 Essential (primary) hypertension: Secondary | ICD-10-CM | POA: Diagnosis not present

## 2017-04-24 DIAGNOSIS — R718 Other abnormality of red blood cells: Secondary | ICD-10-CM

## 2017-04-24 DIAGNOSIS — R634 Abnormal weight loss: Secondary | ICD-10-CM | POA: Diagnosis not present

## 2017-04-24 LAB — CBC WITH DIFFERENTIAL/PLATELET
BASOS ABS: 0.1 10*3/uL (ref 0.0–0.1)
Basophils Relative: 1.2 % (ref 0.0–3.0)
EOS ABS: 0.1 10*3/uL (ref 0.0–0.7)
Eosinophils Relative: 1.4 % (ref 0.0–5.0)
HCT: 34.9 % — ABNORMAL LOW (ref 39.0–52.0)
Hemoglobin: 11.7 g/dL — ABNORMAL LOW (ref 13.0–17.0)
LYMPHS ABS: 2 10*3/uL (ref 0.7–4.0)
Lymphocytes Relative: 45.3 % (ref 12.0–46.0)
MCHC: 33.5 g/dL (ref 30.0–36.0)
MONOS PCT: 8.6 % (ref 3.0–12.0)
Monocytes Absolute: 0.4 10*3/uL (ref 0.1–1.0)
NEUTROS ABS: 1.9 10*3/uL (ref 1.4–7.7)
NEUTROS PCT: 43.5 % (ref 43.0–77.0)
PLATELETS: 277 10*3/uL (ref 150.0–400.0)
RBC: 2.89 Mil/uL — ABNORMAL LOW (ref 4.22–5.81)
RDW: 14.5 % (ref 11.5–15.5)
WBC: 4.4 10*3/uL (ref 4.0–10.5)

## 2017-04-24 LAB — BASIC METABOLIC PANEL
BUN: 10 mg/dL (ref 6–23)
CALCIUM: 9.2 mg/dL (ref 8.4–10.5)
CO2: 25 meq/L (ref 19–32)
Chloride: 107 mEq/L (ref 96–112)
Creatinine, Ser: 0.92 mg/dL (ref 0.40–1.50)
GFR: 108.32 mL/min (ref >60.00)
GLUCOSE: 90 mg/dL (ref 70–99)
Potassium: 3.9 mEq/L (ref 3.5–5.1)
SODIUM: 139 meq/L (ref 135–145)

## 2017-04-24 LAB — HEPATIC FUNCTION PANEL
ALK PHOS: 67 U/L (ref 39–117)
ALT: 18 U/L (ref 0–53)
AST: 20 U/L (ref 0–37)
Albumin: 3.9 g/dL (ref 3.5–5.2)
BILIRUBIN DIRECT: 0 mg/dL (ref 0.0–0.3)
TOTAL PROTEIN: 6.5 g/dL (ref 6.0–8.3)
Total Bilirubin: 0.3 mg/dL (ref 0.2–1.2)

## 2017-04-24 LAB — TSH: TSH: 0.73 u[IU]/mL (ref 0.35–4.50)

## 2017-04-24 NOTE — Progress Notes (Signed)
Pre visit review using our clinic review tool, if applicable. No additional management support is needed unless otherwise documented below in the visit note. 

## 2017-04-24 NOTE — Assessment & Plan Note (Signed)
Pt is overdue for appt w/ Dr Megan Salon.  Encouraged him to schedule.  Will continue to follow along.

## 2017-04-24 NOTE — Assessment & Plan Note (Signed)
Pt continues to lose weight.  He feels that some of this is stress related as his landlord sold the home he was living in and he had to quickly find a new place to live.  In the last week, his appetite is slowly returning but he reports lack of appetite and some nausea x6 weeks.  Will check labs and get CXR due to hx of smoking.  Pt to f/u w/ Dr Megan Salon to check HIV.  Encouraged eating regularly and considering a protein supplement.  Will follow.

## 2017-04-24 NOTE — Progress Notes (Signed)
   Subjective:    Patient ID: Daniel Gonzalez, male    DOB: Jul 08, 1956, 61 y.o.   MRN: [id number]  HPI HTN- chronic problem.  Pt's Valsartan was switched to Benicar 40mg  daily.  Remains on Amlodipine 10mg  and Metoprolol 50mg  daily.  No CP, SOB above baseline, HAs, visual changes, edema.  Weight loss- pt reports he has not been able to eat recently due to lack of appetite.  Things were better this week- pt felt hungry.  + nausea.  No fevers.  Pt is down another 6 lbs from April.  BMI is now 18.74   Review of Systems For ROS see HPI     Objective:   Physical Exam  Constitutional: He is oriented to person, place, and time. He appears well-developed. No distress.  Thin, frail appearing  HENT:  Head: Normocephalic and atraumatic.  Eyes: Pupils are equal, round, and reactive to light. Conjunctivae and EOM are normal.  Neck: Normal range of motion. Neck supple. No thyromegaly present.  Cardiovascular: Normal rate, regular rhythm, normal heart sounds and intact distal pulses.   No murmur heard. Pulmonary/Chest: Effort normal and breath sounds normal. No respiratory distress.  Abdominal: Soft. Bowel sounds are normal. He exhibits no distension.  Musculoskeletal: He exhibits no edema.  Lymphadenopathy:    He has no cervical adenopathy.  Neurological: He is alert and oriented to person, place, and time. No cranial nerve deficit.  Skin: Skin is warm and dry.  Psychiatric: He has a normal mood and affect. His behavior is normal.  Vitals reviewed.         Assessment & Plan:

## 2017-04-24 NOTE — Patient Instructions (Signed)
Schedule your complete physical in 6 months Go to [facility] to get your chest xray done (no appt needed) We'll notify you of your lab results and make any changes if needed Make sure you are eating regularly- at least 3x/day Consider a protein drink to help prevent weight loss Call and schedule an appt w/ Dr Megan Salon Call with any questions or concerns Happy Early Birthday!!!

## 2017-04-24 NOTE — Assessment & Plan Note (Signed)
Chronic problem.  Excellent control since switching to Benicar from Valsartan.  Asymptomatic.  Check labs.  No anticipated med changes.  Will follow.

## 2017-04-27 ENCOUNTER — Other Ambulatory Visit: Payer: Self-pay | Admitting: Family Medicine

## 2017-04-27 LAB — VITAMIN B12: VITAMIN B 12: 136 pg/mL — AB (ref 211–911)

## 2017-04-27 LAB — FOLATE: FOLATE: 7.2 ng/mL (ref >5.9)

## 2017-04-28 ENCOUNTER — Other Ambulatory Visit: Payer: Self-pay | Admitting: Family Medicine

## 2017-04-29 ENCOUNTER — Other Ambulatory Visit: Payer: Self-pay | Admitting: Family Medicine

## 2017-05-01 ENCOUNTER — Ambulatory Visit (INDEPENDENT_AMBULATORY_CARE_PROVIDER_SITE_OTHER): Payer: BLUE CROSS/BLUE SHIELD | Admitting: *Deleted

## 2017-05-01 DIAGNOSIS — E538 Deficiency of other specified B group vitamins: Secondary | ICD-10-CM | POA: Diagnosis not present

## 2017-05-01 MED ORDER — CYANOCOBALAMIN 1000 MCG/ML IJ SOLN
1000.0000 ug | Freq: Once | INTRAMUSCULAR | Status: AC
  Administered 2017-05-01: 1000 ug via INTRAMUSCULAR

## 2017-05-08 ENCOUNTER — Ambulatory Visit (INDEPENDENT_AMBULATORY_CARE_PROVIDER_SITE_OTHER): Payer: BLUE CROSS/BLUE SHIELD | Admitting: *Deleted

## 2017-05-08 DIAGNOSIS — E538 Deficiency of other specified B group vitamins: Secondary | ICD-10-CM | POA: Diagnosis not present

## 2017-05-08 MED ORDER — CYANOCOBALAMIN 1000 MCG/ML IJ SOLN
1000.0000 ug | Freq: Once | INTRAMUSCULAR | Status: AC
  Administered 2017-05-08: 1000 ug via INTRAMUSCULAR

## 2017-05-15 ENCOUNTER — Ambulatory Visit (INDEPENDENT_AMBULATORY_CARE_PROVIDER_SITE_OTHER): Payer: BLUE CROSS/BLUE SHIELD | Admitting: Family Medicine

## 2017-05-15 DIAGNOSIS — E538 Deficiency of other specified B group vitamins: Secondary | ICD-10-CM | POA: Diagnosis not present

## 2017-05-15 MED ORDER — CYANOCOBALAMIN 1000 MCG/ML IJ SOLN
1000.0000 ug | Freq: Once | INTRAMUSCULAR | Status: AC
  Administered 2017-05-15: 1000 ug via INTRAMUSCULAR

## 2017-05-15 NOTE — Progress Notes (Signed)
Due to staffing, pt was placed on Dr. Birdie Riddle schedule. B12 Injection #3 was given in pt right arm. Pt is scheduling nurse visit for next Friday for final once a week B12 injection.   Agreed.  Annye Asa, MD

## 2017-05-22 ENCOUNTER — Ambulatory Visit (INDEPENDENT_AMBULATORY_CARE_PROVIDER_SITE_OTHER): Payer: BLUE CROSS/BLUE SHIELD | Admitting: *Deleted

## 2017-05-22 DIAGNOSIS — E538 Deficiency of other specified B group vitamins: Secondary | ICD-10-CM | POA: Diagnosis not present

## 2017-05-22 MED ORDER — CYANOCOBALAMIN 1000 MCG/ML IJ SOLN
1000.0000 ug | Freq: Once | INTRAMUSCULAR | Status: AC
  Administered 2017-05-22: 1000 ug via INTRAMUSCULAR

## 2017-05-31 ENCOUNTER — Other Ambulatory Visit: Payer: Self-pay | Admitting: Family Medicine

## 2017-06-01 ENCOUNTER — Other Ambulatory Visit: Payer: Self-pay | Admitting: Family Medicine

## 2017-06-02 ENCOUNTER — Telehealth: Payer: Self-pay | Admitting: Family Medicine

## 2017-06-02 NOTE — Telephone Encounter (Signed)
Spoke with patient regarding medications. States he picked up his medications but was given both Metoprolol and Bystolic. He is taking amlodipine and Benicar as directed. Had pharmacy remove Bystolic from his list. He was instructed not to take in addition to regular regimen.

## 2017-06-02 NOTE — Telephone Encounter (Signed)
Looked in pt chart he is actually on 3 BP medications. He should be on all of them.

## 2017-06-02 NOTE — Telephone Encounter (Signed)
Pt states that he now has two different BP meds from the pharmacy and would like a call back to discuss which one he should be taking.

## 2017-06-09 ENCOUNTER — Other Ambulatory Visit: Payer: Self-pay | Admitting: Family Medicine

## 2017-06-23 ENCOUNTER — Telehealth: Payer: Self-pay | Admitting: Family Medicine

## 2017-06-23 DIAGNOSIS — G5600 Carpal tunnel syndrome, unspecified upper limb: Secondary | ICD-10-CM

## 2017-06-23 NOTE — Telephone Encounter (Signed)
Ok to place referral? I do not see where this was discussed previously.

## 2017-06-23 NOTE — Telephone Encounter (Signed)
Ok to refer to hand specialist for carpal tunnel

## 2017-06-23 NOTE — Telephone Encounter (Signed)
Referral placed and pt made aware.  

## 2017-06-23 NOTE — Telephone Encounter (Signed)
Pt asking for a referral to see a hand specialist for carpal tunnel, pt states that this is an on going issue and looks to have something done around the holidays when he could be off work,

## 2017-06-26 ENCOUNTER — Ambulatory Visit (INDEPENDENT_AMBULATORY_CARE_PROVIDER_SITE_OTHER): Payer: BLUE CROSS/BLUE SHIELD | Admitting: *Deleted

## 2017-06-26 DIAGNOSIS — E538 Deficiency of other specified B group vitamins: Secondary | ICD-10-CM

## 2017-06-26 MED ORDER — CYANOCOBALAMIN 1000 MCG/ML IJ SOLN
1000.0000 ug | Freq: Once | INTRAMUSCULAR | Status: AC
  Administered 2017-06-26: 1000 ug via INTRAMUSCULAR

## 2017-06-26 NOTE — Progress Notes (Signed)
1st monthly of 6 injections.   Patient tolerated well.   He has scheduled his next appointment in one month.

## 2017-07-06 ENCOUNTER — Other Ambulatory Visit: Payer: Self-pay | Admitting: Family Medicine

## 2017-07-24 DIAGNOSIS — G5602 Carpal tunnel syndrome, left upper limb: Secondary | ICD-10-CM | POA: Diagnosis not present

## 2017-07-24 DIAGNOSIS — G5601 Carpal tunnel syndrome, right upper limb: Secondary | ICD-10-CM | POA: Diagnosis not present

## 2017-07-31 ENCOUNTER — Ambulatory Visit (INDEPENDENT_AMBULATORY_CARE_PROVIDER_SITE_OTHER): Payer: BLUE CROSS/BLUE SHIELD | Admitting: General Practice

## 2017-07-31 DIAGNOSIS — E538 Deficiency of other specified B group vitamins: Secondary | ICD-10-CM

## 2017-07-31 MED ORDER — CYANOCOBALAMIN 1000 MCG/ML IJ SOLN
1000.0000 ug | INTRAMUSCULAR | Status: DC
  Administered 2017-07-31 – 2017-09-04 (×2): 1000 ug via INTRAMUSCULAR

## 2017-08-03 DIAGNOSIS — G5601 Carpal tunnel syndrome, right upper limb: Secondary | ICD-10-CM | POA: Diagnosis not present

## 2017-08-04 DIAGNOSIS — G5601 Carpal tunnel syndrome, right upper limb: Secondary | ICD-10-CM | POA: Diagnosis not present

## 2017-08-04 DIAGNOSIS — G5602 Carpal tunnel syndrome, left upper limb: Secondary | ICD-10-CM | POA: Diagnosis not present

## 2017-08-12 ENCOUNTER — Other Ambulatory Visit: Payer: Self-pay | Admitting: Family Medicine

## 2017-08-24 ENCOUNTER — Other Ambulatory Visit: Payer: Self-pay | Admitting: Family Medicine

## 2017-08-28 DIAGNOSIS — H40013 Open angle with borderline findings, low risk, bilateral: Secondary | ICD-10-CM | POA: Diagnosis not present

## 2017-08-29 ENCOUNTER — Other Ambulatory Visit: Payer: Self-pay | Admitting: Internal Medicine

## 2017-08-29 DIAGNOSIS — B20 Human immunodeficiency virus [HIV] disease: Secondary | ICD-10-CM

## 2017-09-02 ENCOUNTER — Other Ambulatory Visit: Payer: Self-pay | Admitting: Family Medicine

## 2017-09-04 ENCOUNTER — Ambulatory Visit (INDEPENDENT_AMBULATORY_CARE_PROVIDER_SITE_OTHER): Payer: BLUE CROSS/BLUE SHIELD | Admitting: *Deleted

## 2017-09-04 DIAGNOSIS — E538 Deficiency of other specified B group vitamins: Secondary | ICD-10-CM

## 2017-09-04 NOTE — Progress Notes (Signed)
B12 injection given.  #3 of 6.  Patient tolerated well. Aware to schedule 4th injection in one month.

## 2017-10-23 ENCOUNTER — Other Ambulatory Visit: Payer: Self-pay

## 2017-10-23 ENCOUNTER — Ambulatory Visit (INDEPENDENT_AMBULATORY_CARE_PROVIDER_SITE_OTHER): Payer: BLUE CROSS/BLUE SHIELD | Admitting: Family Medicine

## 2017-10-23 ENCOUNTER — Encounter: Payer: Self-pay | Admitting: Family Medicine

## 2017-10-23 VITALS — BP 122/80 | HR 78 | Temp 98.0°F | Resp 16 | Ht 66.0 in | Wt 117.1 lb

## 2017-10-23 DIAGNOSIS — B20 Human immunodeficiency virus [HIV] disease: Secondary | ICD-10-CM | POA: Diagnosis not present

## 2017-10-23 DIAGNOSIS — E538 Deficiency of other specified B group vitamins: Secondary | ICD-10-CM | POA: Diagnosis not present

## 2017-10-23 DIAGNOSIS — I10 Essential (primary) hypertension: Secondary | ICD-10-CM | POA: Diagnosis not present

## 2017-10-23 DIAGNOSIS — Z0000 Encounter for general adult medical examination without abnormal findings: Secondary | ICD-10-CM | POA: Diagnosis not present

## 2017-10-23 DIAGNOSIS — Z125 Encounter for screening for malignant neoplasm of prostate: Secondary | ICD-10-CM

## 2017-10-23 LAB — CBC WITH DIFFERENTIAL/PLATELET
BASOS ABS: 0 10*3/uL (ref 0.0–0.1)
BASOS PCT: 0.5 % (ref 0.0–3.0)
EOS ABS: 0 10*3/uL (ref 0.0–0.7)
Eosinophils Relative: 0.9 % (ref 0.0–5.0)
HEMATOCRIT: 31.2 % — AB (ref 39.0–52.0)
HEMOGLOBIN: 10.7 g/dL — AB (ref 13.0–17.0)
LYMPHS PCT: 51.1 % — AB (ref 12.0–46.0)
Lymphs Abs: 2.1 10*3/uL (ref 0.7–4.0)
MCHC: 34.4 g/dL (ref 30.0–36.0)
MCV: 115.5 fl — AB (ref 78.0–100.0)
MONOS PCT: 11 % (ref 3.0–12.0)
Monocytes Absolute: 0.4 10*3/uL (ref 0.1–1.0)
NEUTROS ABS: 1.5 10*3/uL (ref 1.4–7.7)
Neutrophils Relative %: 36.5 % — ABNORMAL LOW (ref 43.0–77.0)
PLATELETS: 279 10*3/uL (ref 150.0–400.0)
RBC: 2.7 Mil/uL — ABNORMAL LOW (ref 4.22–5.81)
RDW: 15.6 % — ABNORMAL HIGH (ref 11.5–15.5)
WBC: 4.1 10*3/uL (ref 4.0–10.5)

## 2017-10-23 LAB — HEPATIC FUNCTION PANEL
ALT: 8 U/L (ref 0–53)
AST: 13 U/L (ref 0–37)
Albumin: 3.9 g/dL (ref 3.5–5.2)
Alkaline Phosphatase: 74 U/L (ref 39–117)
BILIRUBIN DIRECT: 0.1 mg/dL (ref 0.0–0.3)
BILIRUBIN TOTAL: 0.4 mg/dL (ref 0.2–1.2)
TOTAL PROTEIN: 7.1 g/dL (ref 6.0–8.3)

## 2017-10-23 LAB — LIPID PANEL
CHOL/HDL RATIO: 4
Cholesterol: 154 mg/dL (ref 0–200)
HDL: 38.3 mg/dL — AB (ref >39.00)
LDL Cholesterol: 103 mg/dL — ABNORMAL HIGH (ref 0–99)
NONHDL: 115.7
Triglycerides: 65 mg/dL (ref 0.0–149.0)
VLDL: 13 mg/dL (ref 0.0–40.0)

## 2017-10-23 LAB — BASIC METABOLIC PANEL
BUN: 12 mg/dL (ref 6–23)
CALCIUM: 9.5 mg/dL (ref 8.4–10.5)
CHLORIDE: 106 meq/L (ref 96–112)
CO2: 25 mEq/L (ref 19–32)
CREATININE: 0.88 mg/dL (ref 0.40–1.50)
GFR: 113.83 mL/min (ref >60.00)
Glucose, Bld: 77 mg/dL (ref 70–99)
Potassium: 4.3 mEq/L (ref 3.5–5.1)
Sodium: 138 mEq/L (ref 135–145)

## 2017-10-23 LAB — VITAMIN B12: VITAMIN B 12: 309 pg/mL (ref 211–911)

## 2017-10-23 LAB — TSH: TSH: 1.14 u[IU]/mL (ref 0.35–4.50)

## 2017-10-23 LAB — PSA: PSA: 3.61 ng/mL (ref 0.10–4.00)

## 2017-10-23 MED ORDER — TRAZODONE HCL 50 MG PO TABS: 75.0000 mg | ORAL_TABLET | Freq: Every day | ORAL | 6 refills | Status: DC

## 2017-10-23 NOTE — Assessment & Plan Note (Signed)
Pt's PE unchanged from previous.  Refuses colonoscopy.  Declines GU exam.  UTD on immunizations.  Check labs.  Anticipatory guidance provided.

## 2017-10-23 NOTE — Progress Notes (Signed)
   Subjective:    Patient ID: Daniel Gonzalez, male    DOB: May 05, 1956, 62 y.o.   MRN: [id number]  HPI CPE- UTD on immunizations.  Due for colonoscopy- pt refuses   Review of Systems Patient reports no vision/hearing changes, anorexia, fever ,adenopathy, persistant/recurrent hoarseness, swallowing issues, chest pain, palpitations, edema, persistant/recurrent cough, hemoptysis, dyspnea (rest,exertional, paroxysmal nocturnal), gastrointestinal  bleeding (melena, rectal bleeding), abdominal pain, excessive heart burn, GU symptoms (dysuria, hematuria, voiding/incontinence issues) syncope, focal weakness, memory loss, numbness & tingling, skin/hair/nail changes, depression, anxiety, abnormal bruising/bleeding, musculoskeletal symptoms/signs.     Objective:   Physical Exam General Appearance:    Alert, cooperative, no distress, appears stated age  Head:    Normocephalic, without obvious abnormality, atraumatic  Eyes:    PERRL, conjunctiva/corneas clear, EOM's intact, fundi    benign, both eyes       Ears:    Normal TM's and external ear canals, both ears  Nose:   Nares normal, septum midline, mucosa normal, no drainage   or sinus tenderness  Throat:   Lips, mucosa, and tongue normal; teeth and gums normal  Neck:   Supple, symmetrical, trachea midline, no adenopathy;       thyroid:  No enlargement/tenderness/nodules  Back:     Symmetric, no curvature, ROM normal, no CVA tenderness  Lungs:     Clear to auscultation bilaterally, respirations unlabored  Chest wall:    No tenderness or deformity  Heart:    Regular rate and rhythm, S1 and S2 normal, no murmur, rub   or gallop  Abdomen:     Soft, non-tender, bowel sounds active all four quadrants,    no masses, no organomegaly  Genitalia:    Deferred at pt's request  Rectal:    Extremities:   Extremities normal, atraumatic, no cyanosis or edema  Pulses:   2+ and symmetric all extremities  Skin:   Skin color, texture, turgor normal, no rashes or  lesions  Lymph nodes:   Cervical, supraclavicular, and axillary nodes normal  Neurologic:   CNII-XII intact. Normal strength, sensation and reflexes      throughout          Assessment & Plan:

## 2017-10-23 NOTE — Patient Instructions (Addendum)
Follow up in 6 months to recheck BP We'll notify you of your lab results and make any changes if needed Keep up the good work- you look great!! Increase the Trazodone to 1.5 tabs nightly (75mg ) to help w/ sleep Call with any questions or concerns Happy Spring!!!

## 2017-10-23 NOTE — Assessment & Plan Note (Signed)
Chronic problem.  Following w/ Dr Megan Salon.

## 2017-10-23 NOTE — Assessment & Plan Note (Signed)
Chronic problem.  Adequate control.  Asymptomatic.  Check labs.  No anticipated med changes.  Will follow. 

## 2017-10-23 NOTE — Assessment & Plan Note (Signed)
Check labs and replete prn. 

## 2017-10-26 ENCOUNTER — Other Ambulatory Visit: Payer: Self-pay | Admitting: Family Medicine

## 2017-10-26 DIAGNOSIS — D649 Anemia, unspecified: Secondary | ICD-10-CM

## 2017-10-30 ENCOUNTER — Other Ambulatory Visit: Payer: BLUE CROSS/BLUE SHIELD

## 2017-10-30 DIAGNOSIS — B20 Human immunodeficiency virus [HIV] disease: Secondary | ICD-10-CM | POA: Diagnosis not present

## 2017-10-30 LAB — T-HELPER CELL (CD4) - (RCID CLINIC ONLY)
CD4 T CELL ABS: 970 /uL (ref 400–2700)
CD4 T CELL HELPER: 33 % (ref 33–55)

## 2017-11-03 LAB — HIV-1 RNA QUANT-NO REFLEX-BLD
HIV 1 RNA QUANT: 43 {copies}/mL — AB
HIV-1 RNA QUANT, LOG: 1.63 {Log_copies}/mL — AB

## 2017-11-05 ENCOUNTER — Other Ambulatory Visit: Payer: Self-pay | Admitting: Family Medicine

## 2017-11-13 ENCOUNTER — Other Ambulatory Visit (INDEPENDENT_AMBULATORY_CARE_PROVIDER_SITE_OTHER): Payer: BLUE CROSS/BLUE SHIELD

## 2017-11-13 DIAGNOSIS — D649 Anemia, unspecified: Secondary | ICD-10-CM

## 2017-11-13 LAB — FECAL OCCULT BLOOD, IMMUNOCHEMICAL: FECAL OCCULT BLD: NEGATIVE

## 2017-11-16 ENCOUNTER — Encounter: Payer: Self-pay | Admitting: General Practice

## 2017-11-17 ENCOUNTER — Ambulatory Visit (INDEPENDENT_AMBULATORY_CARE_PROVIDER_SITE_OTHER): Payer: BLUE CROSS/BLUE SHIELD | Admitting: Internal Medicine

## 2017-11-17 ENCOUNTER — Encounter: Payer: Self-pay | Admitting: Internal Medicine

## 2017-11-17 DIAGNOSIS — F17200 Nicotine dependence, unspecified, uncomplicated: Secondary | ICD-10-CM

## 2017-11-17 DIAGNOSIS — B20 Human immunodeficiency virus [HIV] disease: Secondary | ICD-10-CM

## 2017-11-17 DIAGNOSIS — F528 Other sexual dysfunction not due to a substance or known physiological condition: Secondary | ICD-10-CM | POA: Diagnosis not present

## 2017-11-17 MED ORDER — SILDENAFIL CITRATE 100 MG PO TABS: 100.0000 mg | ORAL_TABLET | Freq: Every day | ORAL | 0 refills | Status: DC | PRN

## 2017-11-17 NOTE — Assessment & Plan Note (Signed)
I told him that I am not aware of any [place] manufacturer making 150 mg tablet.  I had him meet with our pharmacist, Onnie Boer to review options.

## 2017-11-17 NOTE — Progress Notes (Signed)
HPI: Daniel Gonzalez is a 62 y.o. male who is here for his routine HIV visit.       Allergies: Allergies  Allergen Reactions  . Asa [Aspirin] Palpitations    Speeds up heart rate  . Sulfamethoxazole-Trimethoprim Itching    Vitals: Temp: 98 F (36.7 C) (04/02 1544) BP: 152/92 (04/02 1544) Pulse Rate: 59 (04/02 1544)  Past Medical History: Past Medical History:  Diagnosis Date  . Blood in stool   . Cluster headache   . Frequent episodic tension-type headache   . HIV infection ([place])   . Hypertension   . Migraine   . Pneumonia   . Stroke [facility])     Social History: Social History   Socioeconomic History  . Marital status: Divorced    Spouse name: Not on file  . Number of children: 3  . Years of education: HS  . Highest education level: Not on file  Occupational History  . Occupation: Firefighter: [employer]  Social Needs  . Financial resource strain: Not on file  . Food insecurity:    Worry: Not on file    Inability: Not on file  . Transportation needs:    Medical: Not on file    Non-medical: Not on file  Tobacco Use  . Smoking status: Current Every Day Smoker    Packs/day: 0.50    Years: 35.00    Pack years: 17.50    Types: Cigarettes  . Smokeless tobacco: Never Used  . Tobacco comment: slowing down  Substance and Sexual Activity  . Alcohol use: Yes    Alcohol/week: 3.6 oz    Types: 6 Standard drinks or equivalent per week    Comment: no fun drinking by yourself  . Drug use: Yes    Frequency: 7.0 times per week    Types: Marijuana  . Sexual activity: Yes    Partners: Female    Birth control/protection: Condom    Comment: declined condoms, has at home  Lifestyle  . Physical activity:    Days per week: Not on file    Minutes per session: Not on file  . Stress: Not on file  Relationships  . Social connections:    Talks on phone: Not on file    Gets together: Not on file    Attends religious service: Not on file    Active member  of club or organization: Not on file    Attends meetings of clubs or organizations: Not on file    Relationship status: Not on file  Other Topics Concern  . Not on file  Social History Narrative   Patient lives at home alone.   Caffeine Use: 1 cup occasionally    Previous Regimen: CBV/TDF/KLT, Atripla, Complera/[name]  Current Regimen: Odefsey/[name]  Labs: HIV 1 RNA Quant (copies/mL)  Date Value  10/30/2017 43 (H)  12/05/2016 <20 DETECTED (A)  05/30/2016 <20   CD4 T Cell Abs (/uL)  Date Value  10/30/2017 970  12/05/2016 940  05/30/2016 700   Hep B S Ab (no units)  Date Value  10/12/2006 No   Hepatitis B Surface Ag (no units)  Date Value  10/12/2006 No   HCV Ab (no units)  Date Value  10/12/2006 No    CrCl: CrCl cannot be calculated (Patient's most recent lab result is older than the maximum 21 days allowed.).  Lipids:    Component Value Date/Time   CHOL 154 10/23/2017 0836   TRIG 65.0 10/23/2017 0836  HDL 38.30 (L) 10/23/2017 0836   CHOLHDL 4 10/23/2017 0836   VLDL 13.0 10/23/2017 0836   LDLCALC 103 (H) 10/23/2017 0836   HIV Genotype Composite Data Genotype Dates:   Mutations in Bold impact drug susceptibility RT Mutations M184V  PI Mutations D30N, K20T  Integrase Mutations    Interpretation of Genotype Data per Stanford HIV Database Nucleoside RTIs  abacavir (ABC) Low-Level Resistance zidovudine ([name]) Susceptible emtricitabine (FTC) High-Level Resistance lamivudine (3TC) High-Level Resistance tenofovir (TDF) Susceptible   Non-Nucleoside RTIs  doravirine (DOR) Susceptible efavirenz (EFV) Susceptible etravirine (ETR) Susceptible nevirapine (NVP) Susceptible rilpivirine (RPV) Susceptible   Protease Inhibitors  atazanavir/r (ATV/r) Susceptible darunavir/r (DRV/r) Susceptible lopinavir/r (LPV/r) Susceptible   Integrase Inhibitors     Assessment: Daniel Gonzalez is here today for his HIV visit. He is doing well on the regimen but complained about  the $20 copay for his [name]. This is due to the fact that [name] is generic and no copay card is available. His odefsey copay is covered with the copay card. Inez Catalina tried to sign him up with PAF but he makes too much money to qualify. He said that he can afford it. In the future, if this is still an issue we can change his [name] to [place] and get the copay covered.   He also asked about the Viagra cost. Apparently, he has been buying some over the internet at 150mg  dosage. As far as I'm concern, this dosage is not available here. He could have gotten the counterfeit [place]. Advised him to use [facility] to get it. The cost is $139 for 90 tablets from a [place] pharmacy. Gave him the prescription today so he can order it from them.   Recommendations:  Continue Odefsey/zidovudine daily  Onnie Boer, PharmD, BCPS, AAHIVP, CPP Clinical Infectious [place] [facility] 11/17/2017, 9:20 PM

## 2017-11-17 NOTE — Assessment & Plan Note (Signed)
I encouraged him to consider quitting.

## 2017-11-17 NOTE — Assessment & Plan Note (Signed)
His viral load is just barely detectable but overall his infection has been under excellent, long-term control.  He will continue his current regimen and follow-up after lab work in 1 year.

## 2017-11-17 NOTE — Progress Notes (Signed)
Patient Active Problem List   Diagnosis Date Noted  . Cerebral aneurysm, nonruptured 09/02/2013    Priority: High  . History of CVA (cerebrovascular accident) 06/22/2013    Priority: High  . Essential hypertension 05/01/2010    Priority: High  . CIGARETTE SMOKER 09/17/2006    Priority: High  . Human immunodeficiency virus (HIV) disease ([place]) 09/17/2006    Priority: Medium  . B12 deficiency 10/23/2017  . Anemia 08/24/2015  . Hypopigmentation 06/20/2013  . Routine general medical examination at a health care facility 01/07/2013  . CARPAL TUNNEL SYNDROME 01/02/2010  . HEADACHE, CHRONIC, HX OF 01/02/2009  . GENITAL HERPES 09/17/2006  . ERECTILE DYSFUNCTION 09/17/2006  . 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 DIVERTICULOSIS, COLON W/O HEM 09/17/2006    Patient's Medications  New Prescriptions   No medications on file  Previous Medications   ACETAMINOPHEN (TYLENOL) 650 MG CR TABLET    Take 1,300 mg by mouth every 8 (eight) hours as needed for pain.   AMLODIPINE (NORVASC) 10 MG TABLET    TAKE 1 TABLET(10 MG) BY MOUTH DAILY   CLOPIDOGREL (PLAVIX) 75 MG TABLET    TAKE 1 TABLET BY MOUTH EVERY DAY WITH BREAKFAST   CLOPIDOGREL (PLAVIX) 75 MG TABLET    TAKE 1 TABLET BY MOUTH EVERY DAY WITH BREAKFAST   METOPROLOL SUCCINATE (TOPROL-XL) 50 MG 24 HR TABLET    TAKE 1 TABLET(50 MG) BY MOUTH DAILY   ODEFSEY 200-25-25 MG TABS TABLET    TAKE 1 TABLET BY MOUTH DAILY   OLMESARTAN (BENICAR) 40 MG TABLET    TAKE 1 TABLET(40 MG) BY MOUTH DAILY   POLYETHYL GLYCOL-PROPYL GLYCOL (SYSTANE) 0.4-0.3 % SOLN    Apply 1 drop to eye daily as needed (for dry eyes).   SILDENAFIL (VIAGRA) 100 MG TABLET    Take 100 mg by mouth daily as needed for erectile dysfunction.   TRAZODONE (DESYREL) 50 MG TABLET    Take 1.5 tablets (75 mg total) by mouth at bedtime.   ZIDOVUDINE (RETROVIR) 300 MG TABLET    TAKE 1 TABLET (300 MG) BY MOUTH TWICE DAILY  Modified Medications   No medications on file  Discontinued  Medications   ACETAMINOPHEN (TYLENOL) 500 MG TABLET    Take 1,000 mg by mouth every 6 (six) hours as needed for moderate pain.     Subjective: Daniel Gonzalez is in for his routine HIV follow-up visit.  Following his visit 1 year ago his son did move out of his apartment and then he moved.  He is feeling much less stress.  He continues to smoke cigarettes and has no current desire or plan to quit.  He has had no problems obtaining, taking or tolerating his Retrovir or Odefsey.  He does not miss doses.  He says that he gets his Viagra online.  He has been getting a version that says it is 150 mg.  Review of Systems: Review of Systems  Constitutional: Positive for malaise/fatigue. Negative for chills, diaphoresis, fever and weight loss.  HENT: Negative for sore throat.   Respiratory: Negative for cough, sputum production and shortness of breath.   Cardiovascular: Negative for chest pain.  Gastrointestinal: Negative for abdominal pain, diarrhea, heartburn, nausea and vomiting.  Genitourinary: Negative for dysuria and frequency.  Musculoskeletal: Negative for joint pain and myalgias.  Skin: Negative for rash.  Neurological: Negative for dizziness and headaches.  Psychiatric/Behavioral: Negative for depression and substance abuse. The patient is not nervous/anxious.  Past Medical History:  Diagnosis Date  . Blood in stool   . Cluster headache   . Frequent episodic tension-type headache   . HIV infection ([place])   . Hypertension   . Migraine   . Pneumonia   . Stroke [facility])     Social History   Tobacco Use  . Smoking status: Current Every Day Smoker    Packs/day: 0.50    Years: 35.00    Pack years: 17.50    Types: Cigarettes  . Smokeless tobacco: Never Used  . Tobacco comment: slowing down  Substance Use Topics  . Alcohol use: Yes    Alcohol/week: 3.6 oz    Types: 6 Standard drinks or equivalent per week    Comment: no fun drinking by yourself  . Drug use: Yes    Frequency: 7.0 times  per week    Types: Marijuana    Family History  Problem Relation Age of Onset  . Arthritis Mother   . Heart disease Mother   . Hypertension Mother   . Diabetes Mother   . Arthritis Father   . Heart disease Father   . Hypertension Father   . Lung cancer Brother   . Colon cancer Neg Hx     Allergies  Allergen Reactions  . Asa [Aspirin] Palpitations    Speeds up heart rate  . Sulfamethoxazole-Trimethoprim Itching    Health Maintenance  Topic Date Due  . COLONOSCOPY  05/22/2008  . INFLUENZA VACCINE  03/18/2018  . TETANUS/TDAP  12/13/2025  . Hepatitis C Screening  Completed  . HIV Screening  Completed    Objective:  Vitals:   11/17/17 1540 11/17/17 1544  BP:  (!) 152/92  Pulse:  (!) 59  Temp:  98 F (36.7 C)  Weight: 121 lb (54.9 kg)   Height: 5\' 4"  (1.626 m)    Body mass index is 20.77 kg/m.  Physical Exam  Constitutional: He is oriented to person, place, and time.  He is talkative and in no distress.  HENT:  Mouth/Throat: No oropharyngeal exudate.  Eyes: Conjunctivae are normal.  Cardiovascular: Normal rate and regular rhythm.  No murmur heard. Pulmonary/Chest: Effort normal and breath sounds normal.  Abdominal: Soft. He exhibits no mass. There is no tenderness.  Musculoskeletal: Normal range of motion.  Neurological: He is alert and oriented to person, place, and time.  Skin: No rash noted.  Psychiatric: Mood and affect normal.    Lab Results Lab Results  Component Value Date   WBC 4.1 10/23/2017   HGB 10.7 (L) 10/23/2017   HCT 31.2 (L) 10/23/2017   MCV 115.5 (H) 10/23/2017   PLT 279.0 10/23/2017    Lab Results  Component Value Date   CREATININE 0.88 10/23/2017   BUN 12 10/23/2017   NA 138 10/23/2017   K 4.3 10/23/2017   CL 106 10/23/2017   CO2 25 10/23/2017    Lab Results  Component Value Date   ALT 8 10/23/2017   AST 13 10/23/2017   ALKPHOS 74 10/23/2017   BILITOT 0.4 10/23/2017    Lab Results  Component Value Date   CHOL 154  10/23/2017   HDL 38.30 (L) 10/23/2017   LDLCALC 103 (H) 10/23/2017   TRIG 65.0 10/23/2017   CHOLHDL 4 10/23/2017   Lab Results  Component Value Date   LABRPR NON REAC 05/30/2016   HIV 1 RNA Quant (copies/mL)  Date Value  10/30/2017 43 (H)  12/05/2016 <20 DETECTED (A)  05/30/2016 <20   CD4 T Cell  Abs (/uL)  Date Value  10/30/2017 970  12/05/2016 940  05/30/2016 700     Problem List Items Addressed This Visit      High   CIGARETTE SMOKER    I encouraged him to consider quitting.        Medium   Human immunodeficiency virus (HIV) disease ([place])    His viral load is just barely detectable but overall his infection has been under excellent, long-term control.  He will continue his current regimen and follow-up after lab work in 1 year.      Relevant Orders   CBC   T-helper cell (CD4)- ([facility] clinic only)   Comprehensive metabolic panel   Lipid panel   RPR   HIV 1 RNA quant-no reflex-bld     Unprioritized   ERECTILE DYSFUNCTION    I told him that I am not aware of any [place] manufacturer making 150 mg tablet.  I had him meet with our pharmacist, Onnie Boer to review options.           Daniel Bickers, MD [facility] [place] [facility] [phone] pager   [phone] cell 11/17/2017, 4:11 PM

## 2017-12-14 ENCOUNTER — Telehealth: Payer: Self-pay | Admitting: Family Medicine

## 2017-12-14 ENCOUNTER — Other Ambulatory Visit: Payer: Self-pay

## 2017-12-14 MED ORDER — OLMESARTAN MEDOXOMIL 40 MG PO TABS: ORAL_TABLET | ORAL | 0 refills | Status: DC

## 2017-12-14 NOTE — Telephone Encounter (Signed)
Copied from [place] [id number]. Topic: Quick Communication - Rx Refill/Question >> Dec 14, 2017  1:31 PM Margot Ables wrote: Medication: olmesartan - pt states pharmacy requested last week. Pt has been out for 3 days. 30 or 90 day is fine. Has the patient contacted their pharmacy? yes Preferred Pharmacy (with phone number or street name): [facility] [place], [address] AT [address] [phone] (Phone) [phone] (Fax)

## 2017-12-14 NOTE — Telephone Encounter (Signed)
Medication filled in another encounter

## 2017-12-23 ENCOUNTER — Other Ambulatory Visit: Payer: Self-pay | Admitting: Family Medicine

## 2018-01-02 ENCOUNTER — Other Ambulatory Visit: Payer: Self-pay | Admitting: Family Medicine

## 2018-01-27 ENCOUNTER — Other Ambulatory Visit: Payer: Self-pay | Admitting: Family Medicine

## 2018-01-27 MED ORDER — PILL SPLITTER MISC: 0 refills | Status: AC

## 2018-01-27 NOTE — Telephone Encounter (Signed)
Please advise if ok to increase Trazodone dose form 50mg  to 75mg ?

## 2018-01-27 NOTE — Telephone Encounter (Signed)
New rx sent in for pt, he advised he takes 75mg  nightly. Also sent in a pill cutter for pt.

## 2018-01-27 NOTE — Telephone Encounter (Signed)
Patient would like to know if his prescription for  traZODone HCl 50 MG can be uped to 75mg  so he can only take one tablet instead of 1 1/2 tablets because when he tries to cut it in half.  It doesn't always cut the right amount. Please advise.

## 2018-01-27 NOTE — Telephone Encounter (Signed)
Doesn't come in 75mg  tablets: he can take 50 - 100 as needed for sleep. Ok to refill if he needs it.

## 2018-02-03 ENCOUNTER — Telehealth: Payer: Self-pay | Admitting: *Deleted

## 2018-02-03 NOTE — Telephone Encounter (Signed)
Patient called to say that he has a $230 a month copay and he can not afford that. He also tried to use a co pay card and was not able to. Advised him we have a Development worker, community and I am not sure what she can do but she will try to help him find a way to pay for his medications so to call back in the morning after 9 and ask for [place] in [place]. He advised he will do that.

## 2018-02-07 ENCOUNTER — Other Ambulatory Visit: Payer: Self-pay | Admitting: Family Medicine

## 2018-02-11 NOTE — Telephone Encounter (Signed)
PT called today stating he is having issues with paying for his copay on his Odefsey. Pt stated he is using a [employer] copay card, but was informed that he has only $300 left on his copay hard and that he would have to pay the remaining out of pocket. Pt stated if that's the case he would have to pay $200 out of pocket for this upcoming refill and $500 for future refills until his copay card is renewed in January 2020. Transferred pt to Marena Chancy for Patient Advocate to see if he would qualify for further assistance. PT stated his last day of his current medication is Sunday 02/14/18 and that he would like for the Md to be aware in case a medication change is necessary. Will inform Dr. Megan Salon of what is going on. [place]

## 2018-02-15 ENCOUNTER — Telehealth: Payer: Self-pay

## 2018-02-15 DIAGNOSIS — B20 Human immunodeficiency virus [HIV] disease: Secondary | ICD-10-CM

## 2018-02-15 MED ORDER — EMTRICITAB-RILPIVIR-TENOFOV AF 200-25-25 MG PO TABS: 1.0000 | ORAL_TABLET | Freq: Every day | ORAL | 5 refills | Status: DC

## 2018-02-15 NOTE — Telephone Encounter (Signed)
Please review per Dr Megan Salon

## 2018-02-15 NOTE — Telephone Encounter (Signed)
Please see if Minh or Cassie could reviewed [place]'s situation and suggest alternatives.

## 2018-02-15 NOTE — Telephone Encounter (Signed)
Patient calling for medication assistance with Odefsey.  He is not able to pay high co pay and his drug co-pay card has been maxed out.  He can not get a new one until the beginning of next year.  He has ran out of  resources.  He is asking for switch to something with a lower co pay.   Caryl Pina has tried three drug assistance programs which denied services due to income  above requirements.   He is out of medications as of Friday. Can his regimen be changed to a lower cost drug?   Please advise.    Laverle Patter, RN

## 2018-02-16 ENCOUNTER — Other Ambulatory Visit: Payer: Self-pay | Admitting: Pharmacist Clinician (PhC)/ Clinical Pharmacy Specialist

## 2018-02-16 ENCOUNTER — Telehealth: Payer: Self-pay | Admitting: Pharmacist Clinician (PhC)/ Clinical Pharmacy Specialist

## 2018-02-16 DIAGNOSIS — B20 Human immunodeficiency virus [HIV] disease: Secondary | ICD-10-CM

## 2018-02-16 MED ORDER — DOLUTEGRAVIR-RILPIVIRINE 50-25 MG PO TABS: 1.0000 | ORAL_TABLET | Freq: Every day | ORAL | 5 refills | Status: DC

## 2018-02-16 MED ORDER — ZIDOVUDINE 300 MG PO TABS: ORAL_TABLET | ORAL | 5 refills | Status: DC

## 2018-02-16 MED FILL — JULUCA 50-25 MG TAB: 50-25 | 30 days supply | Qty: 30 | Fill #0

## 2018-02-16 NOTE — Telephone Encounter (Signed)
Daniel Gonzalez does have quite a few mutations already. After reviewing our choices, we could continue him on the [name] because it's generic but change his Odefsey to [place]. The [place] card will basically carry him for about 11 months. If the card is out at that time then we can go back to [facility]. It's a little inconvenient but feasible.

## 2018-02-16 NOTE — Telephone Encounter (Signed)
Sounds like a good plan.  Thanks.

## 2018-02-16 NOTE — Telephone Encounter (Signed)
Daniel Gonzalez called back to discuss his therapy change. He ran out of [name] 2 days ago but he did stop the [name] at the same time. He would like [name] mailed to him. [facility] will get it out today so he'll get it tomorrow to restart both. He will come back to see pharmacy in August to do labs.

## 2018-02-16 NOTE — Progress Notes (Signed)
Jontay has been having issue with copay issue for the [facility]. Basically, his copay card is maxed out but he doesn't qualify for PAF because of income. Therefore, we are going to change his regimen to [name] + [place] so the copay card can cover the [place] for about a year. We can switch back if we need to in about a year. [facility] has become a specialty now so they can fill his plan. Left him a message to call back today.   HIV Genotype Composite Data Genotype Dates:   Mutations in Bold impact drug susceptibility RT Mutations M184V  PI Mutations D30N, K20T  Integrase Mutations    Interpretation of Genotype Data per Stanford HIV Database Nucleoside RTIs  abacavir (ABC) Low-Level Resistance zidovudine ([name])  Susceptible emtricitabine (FTC)  High-Level Resistance lamivudine (3TC)  High-Level Resistance tenofovir (TDF)  Susceptible   Non-Nucleoside RTIs  doravirine (DOR) Susceptible efavirenz (EFV)  Susceptible etravirine (ETR)  Susceptible nevirapine (NVP) Susceptible rilpivirine (RPV) Susceptible   Protease Inhibitors  atazanavir/r (ATV/r)           Susceptible darunavir/r (DRV/r)           Susceptible lopinavir/r (LPV/r)            Susceptible   Integrase Inhibitors

## 2018-02-20 ENCOUNTER — Other Ambulatory Visit: Payer: Self-pay | Admitting: Internal Medicine

## 2018-02-20 DIAGNOSIS — B20 Human immunodeficiency virus [HIV] disease: Secondary | ICD-10-CM

## 2018-02-24 ENCOUNTER — Other Ambulatory Visit: Payer: Self-pay | Admitting: Family Medicine

## 2018-03-12 ENCOUNTER — Other Ambulatory Visit: Payer: Self-pay | Admitting: Family Medicine

## 2018-03-15 ENCOUNTER — Telehealth: Payer: Self-pay | Admitting: Family Medicine

## 2018-03-15 MED FILL — JULUCA 50-25 MG TAB: 50-25 | 30 days supply | Qty: 30 | Fill #1

## 2018-03-15 MED FILL — ZIDOVUDINE 300 MG TABLET: 300 | 30 days supply | Qty: 60 | Fill #0

## 2018-03-15 NOTE — Telephone Encounter (Signed)
Copied from [place] [id number] [id number]. Topic: Quick Communication - Rx Refill/Question >> Mar 15, 2018  1:46 PM Burchel, Abbi R wrote: Medication: olmesartan (BENICAR) 40 MG tablet  Preferred Pharmacy: [facility] [place], [place] - [address] AT [address] [place] [address] Phone: [phone] Fax: [phone]     Pt was advised that RX refills may take up to 3 business days. Pt has 1 dose left.

## 2018-03-16 MED ORDER — OLMESARTAN MEDOXOMIL 40 MG PO TABS: ORAL_TABLET | ORAL | 0 refills | Status: DC

## 2018-03-16 NOTE — Telephone Encounter (Signed)
spoke with Phineas Real, Pharmacy Tech, to verify confirmation of request; receipt confirmed by pharmacy on 03/12/18 @ [phone]; she states that it has not been received; will resend request.

## 2018-04-02 ENCOUNTER — Ambulatory Visit (INDEPENDENT_AMBULATORY_CARE_PROVIDER_SITE_OTHER): Payer: BLUE CROSS/BLUE SHIELD | Admitting: Pharmacist

## 2018-04-02 DIAGNOSIS — B20 Human immunodeficiency virus [HIV] disease: Secondary | ICD-10-CM

## 2018-04-02 NOTE — Progress Notes (Addendum)
HPI: Daniel Gonzalez is a 62 y.o. male who presents to the [place] clinic for HIV follow-up.  Patient Active Problem List   Diagnosis Date Noted  . B12 deficiency 10/23/2017  . Anemia 08/24/2015  . Cerebral aneurysm, nonruptured 09/02/2013  . History of CVA (cerebrovascular accident) 06/22/2013  . Hypopigmentation 06/20/2013  . Routine general medical examination at a health care facility 01/07/2013  . Essential hypertension 05/01/2010  . CARPAL TUNNEL SYNDROME 01/02/2010  . HEADACHE, CHRONIC, HX OF 01/02/2009  . Human immunodeficiency virus (HIV) disease ([place]) 09/17/2006  . GENITAL HERPES 09/17/2006  . ERECTILE DYSFUNCTION 09/17/2006  . 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 CIGARETTE SMOKER 09/17/2006  . DIVERTICULOSIS, COLON W/O HEM 09/17/2006    Patient's Medications  New Prescriptions   No medications on file  Previous Medications   ACETAMINOPHEN (TYLENOL) 650 MG CR TABLET    Take 1,300 mg by mouth every 8 (eight) hours as needed for pain.   AMLODIPINE (NORVASC) 10 MG TABLET    TAKE 1 TABLET(10 MG) BY MOUTH DAILY   CLOPIDOGREL (PLAVIX) 75 MG TABLET    TAKE 1 TABLET BY MOUTH EVERY DAY WITH BREAKFAST   DOLUTEGRAVIR-RILPIVIRINE (JULUCA) 50-25 MG TABS    Take 1 tablet by mouth daily with breakfast. Must take with food   METOPROLOL SUCCINATE (TOPROL-XL) 50 MG 24 HR TABLET    TAKE 1 TABLET(50 MG) BY MOUTH DAILY   MISC. DEVICES (PILL SPLITTER) MISC    Please use to cut trazodone in 1/2.   OLMESARTAN (BENICAR) 40 MG TABLET    TAKE 1 TABLET BY MOUTH EVERY DAY   POLYETHYL GLYCOL-PROPYL GLYCOL (SYSTANE) 0.4-0.3 % SOLN    Apply 1 drop to eye daily as needed (for dry eyes).   SILDENAFIL (VIAGRA) 100 MG TABLET    Take 100 mg by mouth daily as needed for erectile dysfunction.   SILDENAFIL (VIAGRA) 100 MG TABLET    Take 1 tablet (100 mg total) by mouth daily as needed for erectile dysfunction.   TRAZODONE (DESYREL) 50 MG TABLET    Take 1.5 tablets (75 mg total) by mouth at bedtime.    ZIDOVUDINE (RETROVIR) 300 MG TABLET    TAKE 1 TABLET (300 MG) BY MOUTH TWICE DAILY  Modified Medications   No medications on file  Discontinued Medications   No medications on file    Allergies: Allergies  Allergen Reactions  . Asa [Aspirin] Palpitations    Speeds up heart rate  . Sulfamethoxazole-Trimethoprim Itching    Past Medical History: Past Medical History:  Diagnosis Date  . Blood in stool   . Cluster headache   . Frequent episodic tension-type headache   . HIV infection ([place])   . Hypertension   . Migraine   . Pneumonia   . Stroke [facility])     Social History: Social History   Socioeconomic History  . Marital status: Divorced    Spouse name: Not on file  . Number of children: 3  . Years of education: HS  . Highest education level: Not on file  Occupational History  . Occupation: Firefighter: [employer]  Social Needs  . Financial resource strain: Not on file  . Food insecurity:    Worry: Not on file    Inability: Not on file  . Transportation needs:    Medical: Not on file    Non-medical: Not on file  Tobacco Use  . Smoking status: Current Every Day Smoker  Packs/day: 0.50    Years: 35.00    Pack years: 17.50    Types: Cigarettes  . Smokeless tobacco: Never Used  . Tobacco comment: slowing down  Substance and Sexual Activity  . Alcohol use: Yes    Alcohol/week: 6.0 standard drinks    Types: 6 Standard drinks or equivalent per week    Comment: no fun drinking by yourself  . Drug use: Yes    Frequency: 7.0 times per week    Types: Marijuana  . Sexual activity: Yes    Partners: Female    Birth control/protection: Condom    Comment: declined condoms, has at home  Lifestyle  . Physical activity:    Days per week: Not on file    Minutes per session: Not on file  . Stress: Not on file  Relationships  . Social connections:    Talks on phone: Not on file    Gets together: Not on file    Attends religious service: Not on file      Active member of club or organization: Not on file    Attends meetings of clubs or organizations: Not on file    Relationship status: Not on file  Other Topics Concern  . Not on file  Social History Narrative   Patient lives at home alone.   Caffeine Use: 1 cup occasionally    Labs: Lab Results  Component Value Date   HIV1RNAQUANT 43 (H) 10/30/2017   HIV1RNAQUANT <20 DETECTED (A) 12/05/2016   HIV1RNAQUANT <20 05/30/2016   CD4TABS 970 10/30/2017   CD4TABS 940 12/05/2016   CD4TABS 700 05/30/2016    RPR and STI Lab Results  Component Value Date   LABRPR NON REAC 05/30/2016   LABRPR NON REAC 11/23/2014   LABRPR NON REAC 10/07/2013   LABRPR NON REACTIVE 06/23/2013   LABRPR NON REAC 09/02/2012    No flowsheet data found.  Hepatitis B Lab Results  Component Value Date   HEPBSAB No 10/12/2006   HEPBSAG No 10/12/2006   Hepatitis C No results found for: HEPCAB, HCVRNAPCRQN Hepatitis A No results found for: HAV Lipids: Lab Results  Component Value Date   CHOL 154 10/23/2017   TRIG 65.0 10/23/2017   HDL 38.30 (L) 10/23/2017   CHOLHDL 4 10/23/2017   VLDL 13.0 10/23/2017   LDLCALC 103 (H) 10/23/2017    Current HIV Regimen: Juluca + [name]  HIV Genotype Composite Data  Mutations inBold impact drug susceptibility RT Mutations  M184V  PI Mutations  D30N,K20T  Integrase Mutations    Interpretation of Genotype Data per [employer] HIV Database Nucleoside RTIs  abacavir (ABC) - Low-Level Resistance zidovudine ([name])- Susceptible emtricitabine (FTC) - High-Level Resistance lamivudine (3TC)-High-Level Resistance tenofovir (TDF)-Susceptible   Non-Nucleoside RTIs  doravirine (DOR) -Susceptible efavirenz (EFV)-Susceptible etravirine (ETR)-Susceptible nevirapine (NVP) -Susceptible rilpivirine (RPV) -Susceptible   Protease Inhibitors  atazanavir/r (ATV/r)- Susceptible darunavir/r (DRV/r) - Susceptible lopinavir/r (LPV/r) - Susceptible    Integrase Inhibitors  N/A    Assessment: Daniel Gonzalez is here today to follow-up with me for his HIV infection.  He was previously on Odefsey + [name] and was having trouble paying for his Odefsey.  He was over income for a lot of the drug assistance programs, so we could not find an avenue to help him.  We changed him to [place] and continued his [name].  He is complaining of increased gas on the Juluca with some diarrhea the last couple of days.  I told him that the gas was not really associated with  Juluca. He does say that he eats fast food every day multiple times per day.  I told him to try and limit the amount of fast food he eats and to take some imodium and gas x to see if that helps. He is agreeable. Told him to call in the future if it does not resolve.  No missed doses. He isn't paying anything for his medications now, so that has resolved.  He does take his ART with food.  Will check a HIV viral load today for him.  He does not f/u with Dr. Megan Salon again until next April.  He asked for a flu shot but we are not able to give them yet.  I told him to call in September and we can set him up to get one.  Plan: - Continue Juluca + [name] - HIV viral load today - F/u with Dr. Megan Salon 11/18/18 at 345pm  Wesly Whisenant L. Kenly Xiao, PharmD, [place], [place] for Infectious Disease 04/02/2018, 11:05 AM

## 2018-04-06 LAB — HIV-1 RNA QUANT-NO REFLEX-BLD
HIV 1 RNA Quant: <20 copies/mL
HIV-1 RNA Quant, Log: <1.3 Log copies/mL

## 2018-04-08 MED FILL — JULUCA 50-25 MG TAB: 50-25 | 30 days supply | Qty: 30 | Fill #2

## 2018-04-08 MED FILL — ZIDOVUDINE 300 MG TABLET: 300 | 30 days supply | Qty: 60 | Fill #1

## 2018-04-25 ENCOUNTER — Other Ambulatory Visit: Payer: Self-pay | Admitting: Family Medicine

## 2018-04-30 ENCOUNTER — Other Ambulatory Visit: Payer: Self-pay

## 2018-04-30 ENCOUNTER — Ambulatory Visit (INDEPENDENT_AMBULATORY_CARE_PROVIDER_SITE_OTHER): Payer: BLUE CROSS/BLUE SHIELD | Admitting: Family Medicine

## 2018-04-30 ENCOUNTER — Encounter: Payer: Self-pay | Admitting: Family Medicine

## 2018-04-30 VITALS — BP 121/82 | HR 55 | Temp 98.3°F | Resp 16 | Ht 64.0 in | Wt 112.4 lb

## 2018-04-30 DIAGNOSIS — R634 Abnormal weight loss: Secondary | ICD-10-CM

## 2018-04-30 DIAGNOSIS — Z23 Encounter for immunization: Secondary | ICD-10-CM | POA: Diagnosis not present

## 2018-04-30 DIAGNOSIS — B20 Human immunodeficiency virus [HIV] disease: Secondary | ICD-10-CM

## 2018-04-30 DIAGNOSIS — I10 Essential (primary) hypertension: Secondary | ICD-10-CM

## 2018-04-30 LAB — BASIC METABOLIC PANEL
BUN: 11 mg/dL (ref 6–23)
CALCIUM: 9.2 mg/dL (ref 8.4–10.5)
CO2: 22 mEq/L (ref 19–32)
Chloride: 107 mEq/L (ref 96–112)
Creatinine, Ser: 0.79 mg/dL (ref 0.40–1.50)
GFR: 128.69 mL/min (ref >60.00)
Glucose, Bld: 64 mg/dL — ABNORMAL LOW (ref 70–99)
Potassium: 3.7 mEq/L (ref 3.5–5.1)
Sodium: 139 mEq/L (ref 135–145)

## 2018-04-30 LAB — HEPATIC FUNCTION PANEL
ALT: 10 U/L (ref 0–53)
AST: 20 U/L (ref 0–37)
Albumin: 4.3 g/dL (ref 3.5–5.2)
Alkaline Phosphatase: 55 U/L (ref 39–117)
BILIRUBIN TOTAL: 0.5 mg/dL (ref 0.2–1.2)
Bilirubin, Direct: 0.1 mg/dL (ref 0.0–0.3)
Total Protein: 7.3 g/dL (ref 6.0–8.3)

## 2018-04-30 LAB — CBC WITH DIFFERENTIAL/PLATELET
BASOS ABS: 0 10*3/uL (ref 0.0–0.1)
Basophils Relative: 0.4 % (ref 0.0–3.0)
EOS PCT: 1.3 % (ref 0.0–5.0)
Eosinophils Absolute: 0.1 10*3/uL (ref 0.0–0.7)
HCT: 36.7 % — ABNORMAL LOW (ref 39.0–52.0)
Hemoglobin: 12.5 g/dL — ABNORMAL LOW (ref 13.0–17.0)
LYMPHS ABS: 2.5 10*3/uL (ref 0.7–4.0)
Lymphocytes Relative: 64.4 % — ABNORMAL HIGH (ref 12.0–46.0)
MCHC: 34.1 g/dL (ref 30.0–36.0)
MCV: 115.7 fl — ABNORMAL HIGH (ref 78.0–100.0)
MONO ABS: 0.4 10*3/uL (ref 0.1–1.0)
Monocytes Relative: 9.8 % (ref 3.0–12.0)
NEUTROS ABS: 0.9 10*3/uL — AB (ref 1.4–7.7)
NEUTROS PCT: 24.1 % — AB (ref 43.0–77.0)
PLATELETS: 238 10*3/uL (ref 150.0–400.0)
RBC: 3.17 Mil/uL — AB (ref 4.22–5.81)
RDW: 15.2 % (ref 11.5–15.5)
WBC: 3.8 10*3/uL — ABNORMAL LOW (ref 4.0–10.5)

## 2018-04-30 LAB — TSH: TSH: 1.06 u[IU]/mL (ref 0.35–4.50)

## 2018-04-30 NOTE — Assessment & Plan Note (Signed)
Chronic problem.  Adequate control.  Asymptomatic.  Check labs.  No anticipated med changes.  Will follow. 

## 2018-04-30 NOTE — Patient Instructions (Signed)
Schedule your complete physical in 6 months We'll notify you of your lab results and make any changes if needed EAT!!!  Do not skip meals! Add protein bars to eat on the go Call with any questions or concerns Happy Early Birthday!!

## 2018-04-30 NOTE — Assessment & Plan Note (Signed)
Following w/ Dr Campbell ?

## 2018-04-30 NOTE — Progress Notes (Signed)
   Subjective:    Patient ID: Daniel Gonzalez, male    DOB: Feb 12, 1956, 62 y.o.   MRN: [id number]  HPI HTN- chronic problem, on Amlodipine 10mg  daily, Metoprolol 50mg  daily, Olmesartan 40mg  daily w/ good control.  No CP, SOB, HAs, visual changes, edema.  Weight loss- pt is down 9 lbs since April.  BMI is 19.3.  Pt continues to have appetite but due to hectic work schedule, most days is not able to eat lunch.  Eating breakfast but some nights after work will 'take a drink, take a shower, and go to bed'.    Review of Systems For ROS see HPI     Objective:   Physical Exam  Constitutional: He is oriented to person, place, and time. He appears well-developed. No distress.  Thin, gaunt appearing  HENT:  Head: Normocephalic and atraumatic.  Eyes: Pupils are equal, round, and reactive to light. Conjunctivae and EOM are normal.  Neck: Normal range of motion. Neck supple. No thyromegaly present.  Cardiovascular: Normal rate, regular rhythm, normal heart sounds and intact distal pulses.  No murmur heard. Pulmonary/Chest: Effort normal and breath sounds normal. No respiratory distress.  Abdominal: Soft. Bowel sounds are normal. He exhibits no distension.  Musculoskeletal: He exhibits no edema.  Lymphadenopathy:    He has no cervical adenopathy.  Neurological: He is alert and oriented to person, place, and time. No cranial nerve deficit.  Skin: Skin is warm and dry.  Psychiatric: He has a normal mood and affect. His behavior is normal.  Vitals reviewed.         Assessment & Plan:  Weight loss- new.  Pt is down 9 lbs.  Pt admits to missing lunch while at work due to busy schedule and then coming home and skipping dinner due to fatigue.  Stressed need for regular meals and adding snacks such as protein bars.  Encouraged to decrease alcohol and increase meals.  Will follow.

## 2018-05-05 ENCOUNTER — Other Ambulatory Visit: Payer: Self-pay | Admitting: Family Medicine

## 2018-05-05 DIAGNOSIS — R718 Other abnormality of red blood cells: Secondary | ICD-10-CM

## 2018-05-05 MED FILL — JULUCA 50-25 MG TAB: 50-25 | 30 days supply | Qty: 30 | Fill #3

## 2018-05-05 MED FILL — ZIDOVUDINE 300 MG TABLET: 300 | 30 days supply | Qty: 60 | Fill #2

## 2018-05-07 ENCOUNTER — Other Ambulatory Visit (INDEPENDENT_AMBULATORY_CARE_PROVIDER_SITE_OTHER): Payer: BLUE CROSS/BLUE SHIELD

## 2018-05-07 DIAGNOSIS — R718 Other abnormality of red blood cells: Secondary | ICD-10-CM | POA: Diagnosis not present

## 2018-05-07 DIAGNOSIS — E538 Deficiency of other specified B group vitamins: Secondary | ICD-10-CM | POA: Diagnosis not present

## 2018-05-07 NOTE — Addendum Note (Signed)
Addended by: Katina Dung on: 05/07/2018 03:19 PM   Modules accepted: Orders

## 2018-05-08 LAB — VITAMIN B12: Vitamin B-12: 290 pg/mL (ref 200–1100)

## 2018-05-10 ENCOUNTER — Encounter: Payer: Self-pay | Admitting: General Practice

## 2018-05-25 ENCOUNTER — Other Ambulatory Visit: Payer: Self-pay | Admitting: Family Medicine

## 2018-05-31 MED FILL — JULUCA 50-25 MG TAB: 50-25 | 30 days supply | Qty: 30 | Fill #4

## 2018-05-31 MED FILL — ZIDOVUDINE 300 MG TABLET: 300 | 30 days supply | Qty: 60 | Fill #3

## 2018-06-07 ENCOUNTER — Other Ambulatory Visit: Payer: Self-pay | Admitting: Family Medicine

## 2018-07-19 ENCOUNTER — Other Ambulatory Visit: Payer: Self-pay | Admitting: Family Medicine

## 2018-07-20 MED FILL — JULUCA 50-25 MG TAB: 50-25 | 30 days supply | Qty: 30 | Fill #5

## 2018-07-20 MED FILL — ZIDOVUDINE 300 MG TABLET: 300 | 30 days supply | Qty: 60 | Fill #4

## 2018-07-26 ENCOUNTER — Other Ambulatory Visit: Payer: Self-pay | Admitting: Family Medicine

## 2018-08-10 ENCOUNTER — Other Ambulatory Visit: Payer: Self-pay | Admitting: Internal Medicine

## 2018-08-12 MED FILL — JULUCA 50-25 MG TAB: 50-25 | 30 days supply | Qty: 30 | Fill #0

## 2018-08-12 MED FILL — ZIDOVUDINE 300 MG TABLET: 300 | 30 days supply | Qty: 60 | Fill #5

## 2018-08-17 ENCOUNTER — Other Ambulatory Visit: Payer: Self-pay | Admitting: Family Medicine

## 2018-08-21 ENCOUNTER — Other Ambulatory Visit: Payer: Self-pay | Admitting: Family Medicine

## 2018-09-02 ENCOUNTER — Other Ambulatory Visit: Payer: Self-pay | Admitting: Internal Medicine

## 2018-09-02 DIAGNOSIS — B20 Human immunodeficiency virus [HIV] disease: Secondary | ICD-10-CM

## 2018-09-05 ENCOUNTER — Other Ambulatory Visit: Payer: Self-pay | Admitting: Family Medicine

## 2018-09-10 ENCOUNTER — Telehealth: Payer: Self-pay | Admitting: Pharmacy Technician

## 2018-09-10 MED FILL — ZIDOVUDINE 300 MG TABLET: 300 | 30 days supply | Qty: 60 | Fill #0

## 2018-09-10 MED FILL — JULUCA 50-25 MG TAB: 50-25 | 30 days supply | Qty: 30 | Fill #1

## 2018-09-10 NOTE — Telephone Encounter (Signed)
Needs copay assistance for Zidovudine and Juluca  Juluca copay card: ID [id number] B:  [id number] G:  [id number] P:  [id number]  Zidovudine is generic and he will need to either pay the copay or work with me to go through PAF.  (I left this info with pharmacy so that he can call me for PAF assistance if he needs to).

## 2018-09-13 ENCOUNTER — Other Ambulatory Visit: Payer: Self-pay | Admitting: Family Medicine

## 2018-10-11 MED FILL — JULUCA 50-25 MG TAB: 50-25 | 30 days supply | Qty: 30 | Fill #2

## 2018-10-11 MED FILL — ZIDOVUDINE 300 MG TABLET: 300 | 30 days supply | Qty: 60 | Fill #1

## 2018-10-13 ENCOUNTER — Other Ambulatory Visit: Payer: Self-pay | Admitting: Family Medicine

## 2018-10-29 ENCOUNTER — Other Ambulatory Visit: Payer: Self-pay

## 2018-10-29 DIAGNOSIS — B20 Human immunodeficiency virus [HIV] disease: Secondary | ICD-10-CM | POA: Diagnosis not present

## 2018-10-29 LAB — T-HELPER CELL (CD4) - (RCID CLINIC ONLY)
CD4 % Helper T Cell: 32 % — ABNORMAL LOW (ref 33–55)
CD4 T Cell Abs: 670 /uL (ref 400–2700)

## 2018-11-01 LAB — LIPID PANEL
Cholesterol: 125 mg/dL (ref <200)
HDL: 41 mg/dL (ref >=40)
LDL Cholesterol (Calc): 70 mg/dL (calc)
Non-HDL Cholesterol (Calc): 84 mg/dL (calc) (ref <130)
Total CHOL/HDL Ratio: 3 (calc) (ref <5.0)
Triglycerides: 68 mg/dL (ref <150)

## 2018-11-01 LAB — COMPREHENSIVE METABOLIC PANEL
AG Ratio: 1.4 (calc) (ref 1.0–2.5)
ALT: 8 U/L — ABNORMAL LOW (ref 9–46)
AST: 16 U/L (ref 10–35)
Albumin: 4.1 g/dL (ref 3.6–5.1)
Alkaline phosphatase (APISO): 75 U/L (ref 35–144)
BUN: 9 mg/dL (ref 7–25)
CO2: 25 mmol/L (ref 20–32)
CREATININE: 0.84 mg/dL (ref 0.70–1.25)
Calcium: 8.8 mg/dL (ref 8.6–10.3)
Chloride: 107 mmol/L (ref 98–110)
GLOBULIN: 3 g/dL (ref 1.9–3.7)
Glucose, Bld: 80 mg/dL (ref 65–99)
Potassium: 4.1 mmol/L (ref 3.5–5.3)
Sodium: 139 mmol/L (ref 135–146)
Total Bilirubin: 0.4 mg/dL (ref 0.2–1.2)
Total Protein: 7.1 g/dL (ref 6.1–8.1)

## 2018-11-01 LAB — CBC
HCT: 33.4 % — ABNORMAL LOW (ref 38.5–50.0)
Hemoglobin: 12.1 g/dL — ABNORMAL LOW (ref 13.2–17.1)
MCH: 40.3 pg — ABNORMAL HIGH (ref 27.0–33.0)
MCHC: 36.2 g/dL — ABNORMAL HIGH (ref 32.0–36.0)
MCV: 111.3 fL — ABNORMAL HIGH (ref 80.0–100.0)
MPV: 9.2 fL (ref 7.5–12.5)
Platelets: 227 10*3/uL (ref 140–400)
RBC: 3 10*6/uL — ABNORMAL LOW (ref 4.20–5.80)
RDW: 14.1 % (ref 11.0–15.0)
WBC: 4.4 10*3/uL (ref 3.8–10.8)

## 2018-11-01 LAB — HIV-1 RNA QUANT-NO REFLEX-BLD
HIV 1 RNA Quant: 38 copies/mL — ABNORMAL HIGH
HIV-1 RNA Quant, Log: 1.58 Log copies/mL — ABNORMAL HIGH

## 2018-11-01 LAB — RPR: RPR Ser Ql: NONREACTIVE

## 2018-11-03 MED FILL — ZIDOVUDINE 300 MG TABLET: 300 | 30 days supply | Qty: 60 | Fill #2

## 2018-11-03 MED FILL — JULUCA 50-25 MG TAB: 50-25 | 30 days supply | Qty: 30 | Fill #3

## 2018-11-12 ENCOUNTER — Other Ambulatory Visit: Payer: Self-pay | Admitting: Family Medicine

## 2018-11-17 ENCOUNTER — Telehealth: Payer: Self-pay | Admitting: *Deleted

## 2018-11-17 ENCOUNTER — Other Ambulatory Visit: Payer: Self-pay | Admitting: Family Medicine

## 2018-11-17 NOTE — Telephone Encounter (Signed)
Per Dr Megan Salon called to offer the patient an evisit. Had to leave a message for him to call the office.

## 2018-11-18 ENCOUNTER — Ambulatory Visit (INDEPENDENT_AMBULATORY_CARE_PROVIDER_SITE_OTHER): Payer: BLUE CROSS/BLUE SHIELD | Admitting: Internal Medicine

## 2018-11-18 ENCOUNTER — Encounter: Payer: Self-pay | Admitting: Internal Medicine

## 2018-11-18 ENCOUNTER — Other Ambulatory Visit: Payer: Self-pay

## 2018-11-18 DIAGNOSIS — B20 Human immunodeficiency virus [HIV] disease: Secondary | ICD-10-CM | POA: Diagnosis not present

## 2018-11-18 NOTE — Telephone Encounter (Signed)
Last Filled: 04/26/18 #135, 0 Last OV: 04/30/2018

## 2018-11-18 NOTE — Telephone Encounter (Signed)
Has not been seen since September

## 2018-11-18 NOTE — Progress Notes (Signed)
Virtual Visit via Telephone Note  I connected with Daniel Gonzalez on 11/18/18 at  3:45 PM EDT by telephone and verified that I am speaking with the correct person using two identifiers.   I discussed the limitations, risks, security and privacy concerns of performing an evaluation and management service by telephone and the availability of in person appointments. I also discussed with the patient that there may be a patient responsible charge related to this service. The patient expressed understanding and agreed to proceed.   History of Present Illness: I spoke to Daniel Gonzalez today by phone.  We switched his Odefsey to [place] and continued along with Retrovir several months ago because of difficulty paying for [facility].  He has had no problems getting his medication and does not recall missing any doses.  He has noted an increase in gas and more frequent bowel movements since starting Juluca.  And now will have 2 bowel movements on most days when he used to have one.  He is not having any diarrhea, abdominal pain, nausea or vomiting.  He is not on any other new medications.  He is feeling well.   Observations/Objective: HIV 1 RNA Quant (copies/mL)  Date Value  10/29/2018 38 (H)  04/02/2018 <20 NOT DETECTED  10/30/2017 43 (H)   CD4 T Cell Abs (/uL)  Date Value  10/29/2018 670  10/30/2017 970  12/05/2016 940    Assessment and Plan: His infection is under good long-term control.  He does not feel that his new symptoms are difficult to manage.  He will continue his current regimen and follow-up after lab work in 6 months.  Follow Up Instructions: Continue Juluca and Retrovir Follow-up after lab work in 6 months   I discussed the assessment and treatment plan with the patient. The patient was provided an opportunity to ask questions and all were answered. The patient agreed with the plan and demonstrated an understanding of the instructions.   The patient was advised to call back or seek an  in-person evaluation if the symptoms worsen or if the condition fails to improve as anticipated.  I provided 15 minutes of non-face-to-face time during this encounter.   Michel Bickers, MD

## 2018-11-21 ENCOUNTER — Other Ambulatory Visit: Payer: Self-pay | Admitting: Family Medicine

## 2018-11-26 ENCOUNTER — Other Ambulatory Visit: Payer: Self-pay | Admitting: Family Medicine

## 2018-12-02 ENCOUNTER — Encounter: Payer: Self-pay | Admitting: Family Medicine

## 2018-12-02 ENCOUNTER — Ambulatory Visit (INDEPENDENT_AMBULATORY_CARE_PROVIDER_SITE_OTHER): Payer: BLUE CROSS/BLUE SHIELD | Admitting: Family Medicine

## 2018-12-02 ENCOUNTER — Other Ambulatory Visit: Payer: Self-pay

## 2018-12-02 VITALS — Ht 64.0 in | Wt 121.0 lb

## 2018-12-02 DIAGNOSIS — L989 Disorder of the skin and subcutaneous tissue, unspecified: Secondary | ICD-10-CM | POA: Diagnosis not present

## 2018-12-02 DIAGNOSIS — F17200 Nicotine dependence, unspecified, uncomplicated: Secondary | ICD-10-CM

## 2018-12-02 DIAGNOSIS — I10 Essential (primary) hypertension: Secondary | ICD-10-CM | POA: Diagnosis not present

## 2018-12-02 NOTE — Progress Notes (Signed)
I have discussed the procedure for the virtual visit with the patient who has given consent to proceed with assessment and treatment.   Deitrich Steve, CMA     

## 2018-12-02 NOTE — Progress Notes (Signed)
Virtual Visit via Video   I connected with Daniel Gonzalez on 12/02/18 at 10:40 AM EDT by a video enabled telemedicine application and verified that I am speaking with the correct person using two identifiers. Location patient: Home Location provider: Acupuncturist, Office Persons participating in the virtual visit: pt and myself  I discussed the limitations of evaluation and management by telemedicine and the availability of in person appointments. The patient expressed understanding and agreed to proceed.  Interactive audio and video telecommunications were attempted between this provider and patient, however failed, due to patient having technical difficulties OR patient did not have access to video capability.  We continued and completed visit with audio only.   Subjective:   HPI:  HTN- chronic problem, on Amlodipine 10mg  daily, Metoprolol 50mg  daily, Olmesartan 40mg  daily.  'I feel great'.  No CP, SOB above baseline (continues to smoke), HAs, visual changes, abd pain, N/V, edema.  Skin lesion- R upper arm, unsure if tick or skin tag.  Not enlarging, not painful.  ROS: See pertinent positives and negatives per HPI.  Patient Active Problem List   Diagnosis Date Noted  . B12 deficiency 10/23/2017  . Anemia 08/24/2015  . Cerebral aneurysm, nonruptured 09/02/2013  . History of CVA (cerebrovascular accident) 06/22/2013  . Hypopigmentation 06/20/2013  . Routine general medical examination at a health care facility 01/07/2013  . Essential hypertension 05/01/2010  . CARPAL TUNNEL SYNDROME 01/02/2010  . HEADACHE, CHRONIC, HX OF 01/02/2009  . Human immunodeficiency virus (HIV) disease ([place]) 09/17/2006  . GENITAL HERPES 09/17/2006  . ERECTILE DYSFUNCTION 09/17/2006  . 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 CIGARETTE SMOKER 09/17/2006  . DIVERTICULOSIS, COLON W/O HEM 09/17/2006    Social History   Tobacco Use  . Smoking status: Current Every Day Smoker    Packs/day: 0.50     Years: 35.00    Pack years: 17.50    Types: Cigarettes  . Smokeless tobacco: Never Used  . Tobacco comment: slowing down  Substance Use Topics  . Alcohol use: Yes    Alcohol/week: 6.0 standard drinks    Types: 6 Standard drinks or equivalent per week    Comment: no fun drinking by yourself    Current Outpatient Medications:  .  acetaminophen (TYLENOL) 650 MG CR tablet, Take 1,300 mg by mouth every 8 (eight) hours as needed for pain., Disp: , Rfl:  .  amLODipine (NORVASC) 10 MG tablet, TAKE 1 TABLET(10 MG) BY MOUTH DAILY, Disp: 90 tablet, Rfl: 0 .  clopidogrel (PLAVIX) 75 MG tablet, TAKE 1 TABLET BY MOUTH EVERY DAY WITH BREAKFAST, Disp: 90 tablet, Rfl: 0 .  JULUCA 50-25 MG TABS, TAKE 1 TABLET BY MOUTH DAILY WITH BREAKFAST. MUST TAKE WITH FOOD, Disp: 30 tablet, Rfl: 5 .  metoprolol succinate (TOPROL-XL) 50 MG 24 hr tablet, TAKE 1 TABLET(50 MG) BY MOUTH DAILY, Disp: 30 tablet, Rfl: 0 .  Misc. Devices (PILL SPLITTER) MISC, Please use to cut trazodone in 1/2., Disp: 1 each, Rfl: 0 .  olmesartan (BENICAR) 40 MG tablet, TAKE 1 TABLET BY MOUTH EVERY DAY, Disp: 30 tablet, Rfl: 0 .  Polyethyl Glycol-Propyl Glycol (SYSTANE) 0.4-0.3 % SOLN, Apply 1 drop to eye daily as needed (for dry eyes)., Disp: , Rfl:  .  sildenafil (VIAGRA) 100 MG tablet, Take 1 tablet (100 mg total) by mouth daily as needed for erectile dysfunction., Disp: 90 tablet, Rfl: 0 .  traZODone (DESYREL) 50 MG tablet, TAKE 1 AND 1/2 TABLETS(75 MG) BY MOUTH AT BEDTIME, Disp: 135  tablet, Rfl: 0 .  zidovudine (RETROVIR) 300 MG tablet, TAKE 1 TABLET (300 MG) BY MOUTH TWICE DAILY, Disp: 60 tablet, Rfl: 2  Allergies  Allergen Reactions  . Asa [Aspirin] Palpitations    Speeds up heart rate  . Sulfamethoxazole-Trimethoprim Itching    Objective:   Ht 5\' 4"  (1.626 m)   Wt 121 lb (54.9 kg)   BMI 20.77 kg/m  Pt is able to speak clearly, coherently without shortness of breath or increased work of breathing.  Thought process is linear.   Mood is appropriate.   Assessment and Plan:   HTN- pt reports feeling well at this time.  No way to check BP today but is coming for labs tomorrow and we will assess.  Smoker- again encouraged him to quit.  He is not interested at this time  Skin lesion- pt wants to know if it's a tick or a skin tag.  Will evaluate tomorrow.  Pt expressed understanding and is in agreement w/ plan.    Annye Asa, MD 12/02/2018

## 2018-12-03 ENCOUNTER — Ambulatory Visit (INDEPENDENT_AMBULATORY_CARE_PROVIDER_SITE_OTHER): Payer: BLUE CROSS/BLUE SHIELD | Admitting: Family Medicine

## 2018-12-03 ENCOUNTER — Other Ambulatory Visit: Payer: Self-pay

## 2018-12-03 ENCOUNTER — Other Ambulatory Visit (INDEPENDENT_AMBULATORY_CARE_PROVIDER_SITE_OTHER): Payer: BLUE CROSS/BLUE SHIELD

## 2018-12-03 DIAGNOSIS — I10 Essential (primary) hypertension: Secondary | ICD-10-CM | POA: Diagnosis not present

## 2018-12-03 DIAGNOSIS — S40861A Insect bite (nonvenomous) of right upper arm, initial encounter: Secondary | ICD-10-CM

## 2018-12-03 DIAGNOSIS — W57XXXA Bitten or stung by nonvenomous insect and other nonvenomous arthropods, initial encounter: Secondary | ICD-10-CM

## 2018-12-03 LAB — HEPATIC FUNCTION PANEL
ALT: 17 U/L (ref 0–53)
AST: 21 U/L (ref 0–37)
Albumin: 4.1 g/dL (ref 3.5–5.2)
Alkaline Phosphatase: 72 U/L (ref 39–117)
Bilirubin, Direct: 0.1 mg/dL (ref 0.0–0.3)
Total Bilirubin: 0.3 mg/dL (ref 0.2–1.2)
Total Protein: 7.6 g/dL (ref 6.0–8.3)

## 2018-12-03 LAB — CBC WITH DIFFERENTIAL/PLATELET
Basophils Absolute: 0 10*3/uL (ref 0.0–0.1)
Basophils Relative: 0.6 % (ref 0.0–3.0)
Eosinophils Absolute: 0.1 10*3/uL (ref 0.0–0.7)
Eosinophils Relative: 1.7 % (ref 0.0–5.0)
HCT: 36.5 % — ABNORMAL LOW (ref 39.0–52.0)
Hemoglobin: 12.7 g/dL — ABNORMAL LOW (ref 13.0–17.0)
Lymphocytes Relative: 59.5 % — ABNORMAL HIGH (ref 12.0–46.0)
Lymphs Abs: 2.2 10*3/uL (ref 0.7–4.0)
MCHC: 34.6 g/dL (ref 30.0–36.0)
MCV: 120.6 fl — ABNORMAL HIGH (ref 78.0–100.0)
Monocytes Absolute: 0.4 10*3/uL (ref 0.1–1.0)
Monocytes Relative: 11.7 % (ref 3.0–12.0)
Neutro Abs: 1 10*3/uL — ABNORMAL LOW (ref 1.4–7.7)
Neutrophils Relative %: 26.5 % — ABNORMAL LOW (ref 43.0–77.0)
Platelets: 245 10*3/uL (ref 150.0–400.0)
RBC: 3.03 Mil/uL — ABNORMAL LOW (ref 4.22–5.81)
RDW: 14.4 % (ref 11.5–15.5)
WBC: 3.7 10*3/uL — ABNORMAL LOW (ref 4.0–10.5)

## 2018-12-03 LAB — LIPID PANEL
Cholesterol: 158 mg/dL (ref 0–200)
HDL: 45 mg/dL (ref >39.00)
LDL Cholesterol: 90 mg/dL (ref 0–99)
NonHDL: 112.96
Total CHOL/HDL Ratio: 4
Triglycerides: 117 mg/dL (ref 0.0–149.0)
VLDL: 23.4 mg/dL (ref 0.0–40.0)

## 2018-12-03 LAB — BASIC METABOLIC PANEL
BUN: 12 mg/dL (ref 6–23)
CO2: 25 mEq/L (ref 19–32)
Calcium: 9.1 mg/dL (ref 8.4–10.5)
Chloride: 103 mEq/L (ref 96–112)
Creatinine, Ser: 0.86 mg/dL (ref 0.40–1.50)
GFR: 109.56 mL/min (ref >60.00)
Glucose, Bld: 82 mg/dL (ref 70–99)
Potassium: 4 mEq/L (ref 3.5–5.1)
Sodium: 137 mEq/L (ref 135–145)

## 2018-12-03 LAB — TSH: TSH: 1.01 u[IU]/mL (ref 0.35–4.50)

## 2018-12-03 NOTE — Progress Notes (Signed)
   Subjective:    Patient ID: [name], male    DOB: 12-14-1955, 63 y.o.   MRN: [id number]  HPI Pt reports area on R upper arm near axillary crease that is either a skin tag or a tick.  Would like this assessed and if a tick, would like it removed.  No fevers, redness, pain.  Has been applying alcohol to the area.   Review of Systems For ROS see HPI     Objective:   Physical Exam Vitals signs reviewed.  Constitutional:      General: He is not in acute distress.    Appearance: Normal appearance. He is not ill-appearing.  HENT:     Head: Normocephalic and atraumatic.  Skin:    General: Skin is warm and dry.     Findings: No erythema or rash.     Comments: Small, dead tick embedded in R upper arm.  Cleaned w/ alcohol.  Easily removed w/ forceps.  Pt tolerated procedure w/o difficulty  Neurological:     General: No focal deficit present.     Mental Status: He is alert and oriented to person, place, and time.           Assessment & Plan:  Tick bite- tick successfully removed intact.  Pt tolerated procedure without difficulty and was appreciative.  Post bite care and red flags provided.

## 2018-12-04 ENCOUNTER — Other Ambulatory Visit: Payer: Self-pay | Admitting: Internal Medicine

## 2018-12-04 DIAGNOSIS — B20 Human immunodeficiency virus [HIV] disease: Secondary | ICD-10-CM

## 2018-12-06 ENCOUNTER — Other Ambulatory Visit: Payer: Self-pay

## 2018-12-06 DIAGNOSIS — B20 Human immunodeficiency virus [HIV] disease: Secondary | ICD-10-CM

## 2018-12-06 MED ORDER — DOLUTEGRAVIR-RILPIVIRINE 50-25 MG PO TABS: 1.0000 | ORAL_TABLET | Freq: Every day | ORAL | 5 refills | Status: DC

## 2018-12-06 MED ORDER — ZIDOVUDINE 300 MG PO TABS: ORAL_TABLET | ORAL | 2 refills | Status: DC

## 2018-12-06 MED FILL — JULUCA 50-25 MG TAB: 50-25 | 30 days supply | Qty: 30 | Fill #4

## 2018-12-06 MED FILL — ZIDOVUDINE 300 MG TABLET: 300 | 30 days supply | Qty: 60 | Fill #0

## 2018-12-12 ENCOUNTER — Other Ambulatory Visit: Payer: Self-pay | Admitting: Family Medicine

## 2018-12-28 ENCOUNTER — Other Ambulatory Visit: Payer: Self-pay | Admitting: Family Medicine

## 2019-01-04 MED FILL — JULUCA 50-25 MG TAB: 50-25 | 30 days supply | Qty: 30 | Fill #5

## 2019-01-04 MED FILL — ZIDOVUDINE 300 MG TABLET: 300 | 30 days supply | Qty: 60 | Fill #1

## 2019-01-11 ENCOUNTER — Other Ambulatory Visit: Payer: Self-pay | Admitting: Family Medicine

## 2019-02-01 ENCOUNTER — Telehealth: Payer: Self-pay | Admitting: Pharmacist

## 2019-02-01 NOTE — Telephone Encounter (Signed)
Received information from clinical pharmacist at [facility], that patient was taking Pepcid while taking his Juluca + [name] for his HIV.  Juluca (specifically the rilpirivine) needs an acidic environment for full absorption and there is a drug-drug interaction between the [place] and Juluca.  Patient has resistance making a switch difficult.  He is also on Plavix, so he cannot take any protease inhibitors.  Spoke to Dr. Megan Salon and then spoke to [place].  Holley Dexter counseled patient to be sure to separate the two medications and take his Juluca 4 hours before or 12 hours after his Pepcid.  He will take the Pepcid at night and the Juluca in the morning at least 12 hours apart. Patient verbalized understanding.

## 2019-02-07 MED FILL — ZIDOVUDINE 300 MG TABLET: 300 | 30 days supply | Qty: 60 | Fill #2

## 2019-02-07 MED FILL — JULUCA 50-25 MG TAB: 50-25 | 30 days supply | Qty: 30 | Fill #0

## 2019-02-08 ENCOUNTER — Other Ambulatory Visit: Payer: Self-pay | Admitting: Family Medicine

## 2019-02-08 DIAGNOSIS — H40013 Open angle with borderline findings, low risk, bilateral: Secondary | ICD-10-CM | POA: Diagnosis not present

## 2019-02-09 ENCOUNTER — Encounter: Payer: Self-pay | Admitting: Family Medicine

## 2019-02-09 ENCOUNTER — Ambulatory Visit (INDEPENDENT_AMBULATORY_CARE_PROVIDER_SITE_OTHER): Payer: BC Managed Care – PPO | Admitting: Family Medicine

## 2019-02-09 ENCOUNTER — Other Ambulatory Visit: Payer: Self-pay

## 2019-02-09 DIAGNOSIS — H60502 Unspecified acute noninfective otitis externa, left ear: Secondary | ICD-10-CM

## 2019-02-09 MED ORDER — NEOMYCIN-POLYMYXIN-HC 3.5-10000-1 OT SUSP: 3.0000 [drp] | Freq: Three times a day (TID) | OTIC | 0 refills | Status: DC

## 2019-02-09 NOTE — Progress Notes (Signed)
Virtual Visit via Video   I connected with patient on 02/09/19 at  2:40 PM EDT by a video enabled telemedicine application and verified that I am speaking with the correct person using two identifiers.  Location patient: Home Location provider: Acupuncturist, Office Persons participating in the virtual visit: Patient, Provider, [place] (Jess B)  I discussed the limitations of evaluation and management by telemedicine and the availability of in person appointments. The patient expressed understanding and agreed to proceed.  Interactive audio and video telecommunications were attempted between this provider and patient, however failed, due to patient having technical difficulties OR patient did not have access to video capability.  We continued and completed visit with audio only.   Subjective:   HPI:  Ear pain- sxs started ~4-5 weeks ago.  'some mornings it's better but some mornings it's not'.  L sided.  Discomfort when touching outer ear.  Unable to wear blue tooth headphones due to pain.  No drainage from the ear.  No recent swimming.  Pt is wearing earbuds all day x4 days/week while at work.  ROS:   See pertinent positives and negatives per HPI.  Patient Active Problem List   Diagnosis Date Noted  . B12 deficiency 10/23/2017  . Anemia 08/24/2015  . Cerebral aneurysm, nonruptured 09/02/2013  . History of CVA (cerebrovascular accident) 06/22/2013  . Hypopigmentation 06/20/2013  . Routine general medical examination at a health care facility 01/07/2013  . Essential hypertension 05/01/2010  . CARPAL TUNNEL SYNDROME 01/02/2010  . HEADACHE, CHRONIC, HX OF 01/02/2009  . Human immunodeficiency virus (HIV) disease ([place]) 09/17/2006  . GENITAL HERPES 09/17/2006  . ERECTILE DYSFUNCTION 09/17/2006  . 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 CIGARETTE SMOKER 09/17/2006  . DIVERTICULOSIS, COLON W/O HEM 09/17/2006    Social History   Tobacco Use  . Smoking status: Current Every Day  Smoker    Packs/day: 0.50    Years: 35.00    Pack years: 17.50    Types: Cigarettes  . Smokeless tobacco: Never Used  . Tobacco comment: slowing down  Substance Use Topics  . Alcohol use: Yes    Alcohol/week: 6.0 standard drinks    Types: 6 Standard drinks or equivalent per week    Comment: no fun drinking by yourself    Current Outpatient Medications:  .  acetaminophen (TYLENOL) 650 MG CR tablet, Take 1,300 mg by mouth every 8 (eight) hours as needed for pain., Disp: , Rfl:  .  amLODipine (NORVASC) 10 MG tablet, TAKE 1 TABLET(10 MG) BY MOUTH DAILY, Disp: 90 tablet, Rfl: 0 .  clopidogrel (PLAVIX) 75 MG tablet, TAKE 1 TABLET BY MOUTH EVERY DAY WITH BREAKFAST, Disp: 90 tablet, Rfl: 0 .  Dolutegravir-Rilpivirine (JULUCA) 50-25 MG TABS, Take 1 tablet by mouth daily., Disp: 30 tablet, Rfl: 5 .  metoprolol succinate (TOPROL-XL) 50 MG 24 hr tablet, TAKE 1 TABLET(50 MG) BY MOUTH DAILY, Disp: 30 tablet, Rfl: 0 .  Misc. Devices (PILL SPLITTER) MISC, Please use to cut trazodone in 1/2., Disp: 1 each, Rfl: 0 .  olmesartan (BENICAR) 40 MG tablet, TAKE 1 TABLET BY MOUTH EVERY DAY, Disp: 90 tablet, Rfl: 0 .  Polyethyl Glycol-Propyl Glycol (SYSTANE) 0.4-0.3 % SOLN, Apply 1 drop to eye daily as needed (for dry eyes)., Disp: , Rfl:  .  sildenafil (VIAGRA) 100 MG tablet, Take 1 tablet (100 mg total) by mouth daily as needed for erectile dysfunction., Disp: 90 tablet, Rfl: 0 .  traZODone (DESYREL) 50 MG tablet, TAKE 1 AND 1/2  TABLETS(75 MG) BY MOUTH AT BEDTIME, Disp: 135 tablet, Rfl: 0 .  zidovudine (RETROVIR) 300 MG tablet, TAKE 1 TABLET (300 MG) BY MOUTH TWICE DAILY, Disp: 60 tablet, Rfl: 2  Allergies  Allergen Reactions  . Asa [Aspirin] Palpitations    Speeds up heart rate  . Sulfamethoxazole-Trimethoprim Itching    Objective:   There were no vitals taken for this visit. Pt is able to speak clearly, coherently without shortness of breath or increased work of breathing.  Thought process is  linear.  Mood is appropriate.  Assessment and Plan:   Otitis externa- new.  Based on the external pain and discomfort and the fact that he wears ear buds regularly, I suspect OE.  Start topical abx drops and encouraged him to avoid wearing headphones until feeling better.  Reviewed supportive care and red flags that should prompt return.  Pt expressed understanding and is in agreement w/ plan.    Annye Asa, MD 02/09/2019

## 2019-02-09 NOTE — Progress Notes (Signed)
I have discussed the procedure for the virtual visit with the patient who has given consent to proceed with assessment and treatment.   Raju Coppolino L Berthe Oley, CMA     

## 2019-02-10 ENCOUNTER — Telehealth: Payer: Self-pay | Admitting: Pharmacy Technician

## 2019-02-10 NOTE — Telephone Encounter (Signed)
[facility] Patient Advocate Encounter  Patient's Juluca copayment card has run out of funds. Filled out application for Good Days and submitted on 02/10/2019. Patient provided [place] with all required income verification documents. Will follow-up once a determination is made by Good Days.  Bartholomew Crews, CPhT Specialty Pharmacy Patient [facility] for Infectious Disease Phone: [phone] Fax: [phone] 02/10/2019 11:33 AM

## 2019-02-19 ENCOUNTER — Other Ambulatory Visit: Payer: Self-pay | Admitting: Family Medicine

## 2019-02-25 NOTE — Telephone Encounter (Signed)
[facility] Patient Advocate Encounter   I was successful in securing patient a $ 7500 grant from [employer] to provide copayment coverage for Juluca. The patient's out of pocket cost will be $5.00 monthly.     I have spoken with the patient and he is okay with that price each month.    The billing information is as follows and has been shared with [place].   Member ID: [id number] Group ID: [id number] RxBin: [id number] PCN: [id number] Dates of Eligibility: 02/11/2019 through 08/18/2019  Patient knows to call the office with questions or concerns.  Bartholomew Crews, [place] Patient [facility] for Infectious Disease Phone: [phone] Fax: [phone] 02/25/2019 4:02 PM

## 2019-03-02 ENCOUNTER — Other Ambulatory Visit: Payer: Self-pay | Admitting: Pharmacist

## 2019-03-02 DIAGNOSIS — B20 Human immunodeficiency virus [HIV] disease: Secondary | ICD-10-CM

## 2019-03-02 MED ORDER — ZIDOVUDINE 300 MG PO TABS: 300.0000 mg | ORAL_TABLET | Freq: Two times a day (BID) | ORAL | 4 refills | Status: DC

## 2019-03-02 MED FILL — ZIDOVUDINE 300 MG TABLET: 300 | 30 days supply | Qty: 60 | Fill #0

## 2019-03-02 MED FILL — JULUCA 50-25 MG TAB: 50-25 | 30 days supply | Qty: 30 | Fill #1

## 2019-03-02 NOTE — Progress Notes (Signed)
Sending in [name] refills to go with patient's Juluca to [facility].

## 2019-03-12 ENCOUNTER — Other Ambulatory Visit: Payer: Self-pay | Admitting: Family Medicine

## 2019-03-16 ENCOUNTER — Encounter: Payer: Self-pay | Admitting: Physician Assistant

## 2019-03-16 ENCOUNTER — Ambulatory Visit (INDEPENDENT_AMBULATORY_CARE_PROVIDER_SITE_OTHER): Payer: BC Managed Care – PPO | Admitting: Physician Assistant

## 2019-03-16 ENCOUNTER — Other Ambulatory Visit: Payer: Self-pay

## 2019-03-16 VITALS — BP 140/80 | HR 66 | Temp 98.7°F | Resp 16 | Ht 64.0 in | Wt 120.0 lb

## 2019-03-16 DIAGNOSIS — H66002 Acute suppurative otitis media without spontaneous rupture of ear drum, left ear: Secondary | ICD-10-CM

## 2019-03-16 DIAGNOSIS — H6122 Impacted cerumen, left ear: Secondary | ICD-10-CM

## 2019-03-16 DIAGNOSIS — H60392 Other infective otitis externa, left ear: Secondary | ICD-10-CM

## 2019-03-16 MED ORDER — OFLOXACIN 0.3 % OT SOLN: OTIC | 0 refills | Status: DC

## 2019-03-16 MED ORDER — FLUTICASONE PROPIONATE 50 MCG/ACT NA SUSP: 2.0000 | Freq: Every day | NASAL | 0 refills | Status: DC

## 2019-03-16 MED ORDER — NEOMYCIN-POLYMYXIN-HC 3.5-10000-1 OT SOLN: 3.0000 [drp] | Freq: Four times a day (QID) | OTIC | 0 refills | Status: DC

## 2019-03-16 MED ORDER — AMOXICILLIN 875 MG PO TABS: 875.0000 mg | ORAL_TABLET | Freq: Two times a day (BID) | ORAL | 0 refills | Status: DC

## 2019-03-16 NOTE — Progress Notes (Signed)
Patient presents to clinic today c/o continued pain of L ear. Was seen about 1 month ago by PCP via virtual visit and diagnosed with suspected otitis externa. Was given Cortisporin Otic with only some relief of symptoms. Notes pain in the ear canal described as throbbing. Denies hearing changes, tinnitus or drainage from ear. Denies fever or chills. Denies nasal congestion or URI symptoms. Notes pressure and Popping in the ears bilaterally but no pain or swelling of R ear.   Past Medical History:  Diagnosis Date  . Blood in stool   . Cluster headache   . Frequent episodic tension-type headache   . HIV infection ([place])   . Hypertension   . Migraine   . Pneumonia   . Stroke [facility])     Current Outpatient Medications on File Prior to Visit  Medication Sig Dispense Refill  . acetaminophen (TYLENOL) 650 MG CR tablet Take 1,300 mg by mouth every 8 (eight) hours as needed for pain.    Marland Kitchen amLODipine (NORVASC) 10 MG tablet TAKE 1 TABLET(10 MG) BY MOUTH DAILY 90 tablet 0  . clopidogrel (PLAVIX) 75 MG tablet TAKE 1 TABLET BY MOUTH EVERY DAY WITH BREAKFAST 90 tablet 0  . Dolutegravir-Rilpivirine (JULUCA) 50-25 MG TABS Take 1 tablet by mouth daily. 30 tablet 5  . metoprolol succinate (TOPROL-XL) 50 MG 24 hr tablet TAKE 1 TABLET(50 MG) BY MOUTH DAILY 90 tablet 0  . Misc. Devices (PILL SPLITTER) MISC Please use to cut trazodone in 1/2. 1 each 0  . olmesartan (BENICAR) 40 MG tablet TAKE 1 TABLET BY MOUTH EVERY DAY 90 tablet 0  . Polyethyl Glycol-Propyl Glycol (SYSTANE) 0.4-0.3 % SOLN Apply 1 drop to eye daily as needed (for dry eyes).    . sildenafil (VIAGRA) 100 MG tablet Take 1 tablet (100 mg total) by mouth daily as needed for erectile dysfunction. 90 tablet 0  . traZODone (DESYREL) 50 MG tablet TAKE 1 AND 1/2 TABLETS(75 MG) BY MOUTH AT BEDTIME 135 tablet 0  . zidovudine (RETROVIR) 300 MG tablet Take 1 tablet (300 mg total) by mouth 2 (two) times daily. 60 tablet 4   No current facility-administered  medications on file prior to visit.     Allergies  Allergen Reactions  . Asa [Aspirin] Palpitations    Speeds up heart rate  . Sulfamethoxazole-Trimethoprim Itching    Family History  Problem Relation Age of Onset  . Arthritis Mother   . Heart disease Mother   . Hypertension Mother   . Diabetes Mother   . Arthritis Father   . Heart disease Father   . Hypertension Father   . Lung cancer Brother   . Colon cancer Neg Hx     Social History   Socioeconomic History  . Marital status: Divorced    Spouse name: Not on file  . Number of children: 3  . Years of education: HS  . Highest education level: Not on file  Occupational History  . Occupation: Firefighter: [employer]  Social Needs  . Financial resource strain: Not on file  . Food insecurity    Worry: Not on file    Inability: Not on file  . Transportation needs    Medical: Not on file    Non-medical: Not on file  Tobacco Use  . Smoking status: Current Every Day Smoker    Packs/day: 0.50    Years: 35.00    Pack years: 17.50    Types: Cigarettes  . Smokeless  tobacco: Never Used  . Tobacco comment: slowing down  Substance and Sexual Activity  . Alcohol use: Yes    Alcohol/week: 6.0 standard drinks    Types: 6 Standard drinks or equivalent per week    Comment: no fun drinking by yourself  . Drug use: Yes    Frequency: 7.0 times per week    Types: Marijuana  . Sexual activity: Yes    Partners: Female    Birth control/protection: Condom    Comment: declined condoms, has at home  Lifestyle  . Physical activity    Days per week: Not on file    Minutes per session: Not on file  . Stress: Not on file  Relationships  . Social Herbalist on phone: Not on file    Gets together: Not on file    Attends religious service: Not on file    Active member of club or organization: Not on file    Attends meetings of clubs or organizations: Not on file    Relationship status: Not on file  Other  Topics Concern  . Not on file  Social History Narrative   Patient lives at home alone.   Caffeine Use: 1 cup occasionally   Review of Systems - See HPI.  All other ROS are negative.  Temp 98.7 F (37.1 C) (Skin)   Resp 16   Wt 120 lb (54.4 kg)   BMI 20.60 kg/m   Physical Exam Vitals signs reviewed.  Constitutional:      Appearance: Normal appearance.  HENT:     Head: Normocephalic and atraumatic.     Right Ear: Tympanic membrane normal.     Left Ear: Swelling and tenderness present. A middle ear effusion is present. There is impacted cerumen. Tympanic membrane is erythematous and bulging.     Nose: Nose normal.     Mouth/Throat:     Mouth: Mucous membranes are moist.  Eyes:     Conjunctiva/sclera: Conjunctivae normal.  Neck:     Musculoskeletal: Neck supple.  Cardiovascular:     Rate and Rhythm: Normal rate and regular rhythm.     Pulses: Normal pulses.     Heart sounds: Normal heart sounds.  Pulmonary:     Effort: Pulmonary effort is normal.     Breath sounds: Normal breath sounds.  Lymphadenopathy:     Head:     Right side of head: No submental, submandibular, tonsillar, preauricular or posterior auricular adenopathy.     Left side of head: No submental, submandibular, tonsillar, preauricular or posterior auricular adenopathy.     Cervical:     Right cervical: No superficial, deep or posterior cervical adenopathy.    Left cervical: No superficial, deep or posterior cervical adenopathy.  Neurological:     Mental Status: He is alert.    Assessment/Plan: 1. Other infective acute otitis externa of left ear Rx ofloxacin otic and cortisporin otic was subtherapeutic, he is allergic to sulfa drugs and TMP (which excludes use of otic trimethoprim combination), need to avoid any oral FQ due to antiviral medications. Supportive measures and OTC pain relief reviewed. Treatment for concomitant AOM as noted below. ENT if not resolving.  2. Non-recurrent acute suppurative  otitis media of left ear without spontaneous rupture of tympanic membrane - amoxicillin (AMOXIL) 875 MG tablet; Take 1 tablet (875 mg total) by mouth 2 (two) times daily.  Dispense: 20 tablet; Refill: 0  3. Impacted cerumen of left ear Removed manually with curette. Tolerated well overall  with minimal pain (2/2 to OE).    Leeanne Rio, PA-C

## 2019-03-16 NOTE — Patient Instructions (Signed)
Please continue to avoid putting the ear plugs in the ear. Do put a cotton ball in the left ear canal before showering to prevent excess water from getting in the ear. Take the Amoxicillin as directed with food. Use the new ear drop as directed. The Flonase nasal spray will help with some of the eustachian tube dysfunction that is contributing to fluid buildup behind the ear drum.  If not improving/resolving we will need to consider an ENT specialist.

## 2019-04-04 MED FILL — JULUCA 50-25 MG TAB: 50-25 | 30 days supply | Qty: 30 | Fill #2

## 2019-04-04 MED FILL — ZIDOVUDINE 300 MG TABLET: 300 | 30 days supply | Qty: 60 | Fill #1

## 2019-04-09 ENCOUNTER — Other Ambulatory Visit: Payer: Self-pay | Admitting: Family Medicine

## 2019-04-11 ENCOUNTER — Other Ambulatory Visit: Payer: Self-pay | Admitting: Family Medicine

## 2019-04-19 ENCOUNTER — Telehealth: Payer: Self-pay | Admitting: *Deleted

## 2019-04-19 DIAGNOSIS — H9209 Otalgia, unspecified ear: Secondary | ICD-10-CM

## 2019-04-19 DIAGNOSIS — H60392 Other infective otitis externa, left ear: Secondary | ICD-10-CM

## 2019-04-19 MED ORDER — FLUTICASONE PROPIONATE 50 MCG/ACT NA SUSP: 2.0000 | Freq: Every day | NASAL | 0 refills | Status: DC

## 2019-04-19 NOTE — Addendum Note (Signed)
Addended by: Davis Gourd on: 04/19/2019 11:23 AM   Modules accepted: Orders

## 2019-04-19 NOTE — Telephone Encounter (Signed)
Patient called and states that he is still having the pain/infection in his ears.  He is asking if he can get a refill on the ear drops and nasal spray and also a referral to ENT.

## 2019-04-19 NOTE — Telephone Encounter (Signed)
Patient notified of PCP recommendations and is agreement and expresses an understanding. Also scheduled his flu shot as he was adamant he receive it before he sees ENT if possible.   [place] for [facility] to Discuss results / PCP recommendations / Schedule patient.

## 2019-04-19 NOTE — Telephone Encounter (Signed)
Medication filled and referral placed, will call and inform pt.

## 2019-04-19 NOTE — Telephone Encounter (Signed)
[place] for ENT referral- ear pain, otitis externa.  Can refill Flonase.  Will hold on ear drops as these have not been effective the previous 2 times.  Will wait until ENT is able to assess and treat

## 2019-04-19 NOTE — Telephone Encounter (Signed)
Please advise 

## 2019-04-22 ENCOUNTER — Other Ambulatory Visit: Payer: Self-pay

## 2019-04-22 ENCOUNTER — Ambulatory Visit (INDEPENDENT_AMBULATORY_CARE_PROVIDER_SITE_OTHER): Payer: BC Managed Care – PPO

## 2019-04-22 ENCOUNTER — Encounter: Payer: Self-pay | Admitting: Family Medicine

## 2019-04-22 DIAGNOSIS — Z23 Encounter for immunization: Secondary | ICD-10-CM | POA: Diagnosis not present

## 2019-04-27 ENCOUNTER — Other Ambulatory Visit: Payer: Self-pay | Admitting: Physician Assistant

## 2019-04-29 ENCOUNTER — Other Ambulatory Visit: Payer: Self-pay | Admitting: Emergency Medicine

## 2019-04-29 MED ORDER — TRAZODONE HCL 50 MG PO TABS: ORAL_TABLET | ORAL | 0 refills | Status: DC

## 2019-05-02 MED FILL — ZIDOVUDINE 300 MG TABLET: 300 | 30 days supply | Qty: 60 | Fill #2

## 2019-05-02 MED FILL — JULUCA 50-25 MG TAB: 50-25 | 30 days supply | Qty: 30 | Fill #3

## 2019-05-17 DIAGNOSIS — H61892 Other specified disorders of left external ear: Secondary | ICD-10-CM | POA: Diagnosis not present

## 2019-05-17 DIAGNOSIS — F17210 Nicotine dependence, cigarettes, uncomplicated: Secondary | ICD-10-CM | POA: Diagnosis not present

## 2019-05-17 DIAGNOSIS — H60392 Other infective otitis externa, left ear: Secondary | ICD-10-CM | POA: Diagnosis not present

## 2019-05-17 DIAGNOSIS — H6122 Impacted cerumen, left ear: Secondary | ICD-10-CM | POA: Insufficient documentation

## 2019-05-20 ENCOUNTER — Other Ambulatory Visit: Payer: Self-pay | Admitting: General Practice

## 2019-05-20 MED ORDER — AMLODIPINE BESYLATE 10 MG PO TABS: ORAL_TABLET | ORAL | 0 refills | Status: DC

## 2019-05-23 DIAGNOSIS — H6122 Impacted cerumen, left ear: Secondary | ICD-10-CM | POA: Diagnosis not present

## 2019-05-23 DIAGNOSIS — H9202 Otalgia, left ear: Secondary | ICD-10-CM | POA: Diagnosis not present

## 2019-05-25 ENCOUNTER — Other Ambulatory Visit: Payer: BLUE CROSS/BLUE SHIELD

## 2019-05-27 ENCOUNTER — Other Ambulatory Visit: Payer: Self-pay

## 2019-05-27 ENCOUNTER — Other Ambulatory Visit: Payer: BC Managed Care – PPO

## 2019-05-27 DIAGNOSIS — B20 Human immunodeficiency virus [HIV] disease: Secondary | ICD-10-CM | POA: Diagnosis not present

## 2019-05-27 LAB — T-HELPER CELL (CD4) - (RCID CLINIC ONLY)
CD4 % Helper T Cell: 40 % (ref 33–65)
CD4 T Cell Abs: 1128 /uL (ref 400–1790)

## 2019-05-30 ENCOUNTER — Other Ambulatory Visit: Payer: Self-pay | Admitting: General Practice

## 2019-05-30 MED ORDER — TRAZODONE HCL 50 MG PO TABS: ORAL_TABLET | ORAL | 0 refills | Status: DC

## 2019-05-30 MED FILL — ZIDOVUDINE 300 MG TABLET: 300 | 30 days supply | Qty: 60 | Fill #3

## 2019-05-30 MED FILL — JULUCA 50-25 MG TAB: 50-25 | 30 days supply | Qty: 30 | Fill #4

## 2019-06-01 LAB — HIV-1 RNA QUANT-NO REFLEX-BLD
HIV 1 RNA Quant: <20 copies/mL
HIV-1 RNA Quant, Log: <1.3 Log copies/mL

## 2019-06-06 DIAGNOSIS — H9202 Otalgia, left ear: Secondary | ICD-10-CM | POA: Diagnosis not present

## 2019-06-06 DIAGNOSIS — F17210 Nicotine dependence, cigarettes, uncomplicated: Secondary | ICD-10-CM | POA: Diagnosis not present

## 2019-06-06 DIAGNOSIS — H61892 Other specified disorders of left external ear: Secondary | ICD-10-CM | POA: Diagnosis not present

## 2019-06-08 ENCOUNTER — Ambulatory Visit: Payer: BLUE CROSS/BLUE SHIELD | Admitting: Internal Medicine

## 2019-06-08 ENCOUNTER — Ambulatory Visit (INDEPENDENT_AMBULATORY_CARE_PROVIDER_SITE_OTHER): Payer: BC Managed Care – PPO | Admitting: Internal Medicine

## 2019-06-08 ENCOUNTER — Encounter: Payer: Self-pay | Admitting: Internal Medicine

## 2019-06-08 ENCOUNTER — Other Ambulatory Visit: Payer: Self-pay

## 2019-06-08 DIAGNOSIS — F17200 Nicotine dependence, unspecified, uncomplicated: Secondary | ICD-10-CM

## 2019-06-08 DIAGNOSIS — F528 Other sexual dysfunction not due to a substance or known physiological condition: Secondary | ICD-10-CM

## 2019-06-08 DIAGNOSIS — B20 Human immunodeficiency virus [HIV] disease: Secondary | ICD-10-CM | POA: Diagnosis not present

## 2019-06-08 DIAGNOSIS — L720 Epidermal cyst: Secondary | ICD-10-CM

## 2019-06-08 MED ORDER — DARUNAVIR-COBICISTAT 800-150 MG PO TABS: 1.0000 | ORAL_TABLET | Freq: Every day | ORAL | 11 refills | Status: DC

## 2019-06-08 MED ORDER — BICTEGRAVIR-EMTRICITAB-TENOFOV 50-200-25 MG PO TABS: 1.0000 | ORAL_TABLET | Freq: Every day | ORAL | 11 refills | Status: DC

## 2019-06-08 NOTE — Assessment & Plan Note (Signed)
I talked to him again about the importance of considering quitting smoking particularly in light of his prior CVA and current concerns about erectile dysfunction.

## 2019-06-08 NOTE — Assessment & Plan Note (Signed)
I suggested that he talk to his PCP about his concerns.

## 2019-06-08 NOTE — Assessment & Plan Note (Signed)
His infection remains under excellent, long-term control.  I am not sure that Juluca is the cause of his flatus but will switch him to [facility] and [name] to see if he improves.  We switched to his current regimen last year because he was having difficulty with his co-pay for [facility].  He will have co-pay cards for both Biktarvy and [name].  He is no longer using Flonase which could have interacted with the cobicistat component of [name].  He has already received his influenza vaccine.  He will follow-up here in 6 weeks.

## 2019-06-08 NOTE — Progress Notes (Signed)
Patient Active Problem List   Diagnosis Date Noted  . Cerebral aneurysm, nonruptured 09/02/2013    Priority: High  . History of CVA (cerebrovascular accident) 06/22/2013    Priority: High  . Essential hypertension 05/01/2010    Priority: High  . CIGARETTE SMOKER 09/17/2006    Priority: High  . Human immunodeficiency virus (HIV) disease ([place]) 09/17/2006    Priority: Medium  . Ruptured epithelial cyst 06/08/2019  . B12 deficiency 10/23/2017  . Anemia 08/24/2015  . Hypopigmentation 06/20/2013  . Routine general medical examination at a health care facility 01/07/2013  . CARPAL TUNNEL SYNDROME 01/02/2010  . HEADACHE, CHRONIC, HX OF 01/02/2009  . GENITAL HERPES 09/17/2006  . ERECTILE DYSFUNCTION 09/17/2006  . 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 DIVERTICULOSIS, COLON W/O HEM 09/17/2006    Patient's Medications  New Prescriptions   BICTEGRAVIR-EMTRICITABINE-TENOFOVIR AF (BIKTARVY) 50-200-25 MG TABS TABLET    Take 1 tablet by mouth daily.   DARUNAVIR-COBICISTAT ([name]) 800-150 MG TABLET    Take 1 tablet by mouth daily. Swallow whole. Do NOT crush, break or chew tablets. Take with food.  Previous Medications   ACETAMINOPHEN (TYLENOL) 650 MG CR TABLET    Take 1,300 mg by mouth every 8 (eight) hours as needed for pain.   AMLODIPINE (NORVASC) 10 MG TABLET    TAKE 1 TABLET(10 MG) BY MOUTH DAILY   CLOPIDOGREL (PLAVIX) 75 MG TABLET    TAKE 1 TABLET BY MOUTH EVERY DAY WITH BREAKFAST   METOPROLOL SUCCINATE (TOPROL-XL) 50 MG 24 HR TABLET    TAKE 1 TABLET(50 MG) BY MOUTH DAILY   MISC. DEVICES (PILL SPLITTER) MISC    Please use to cut trazodone in 1/2.   OFLOXACIN (FLOXIN) 0.3 % OTIC SOLUTION    Apply 10 drops to the affected ear(s) daily for 7 days.   OLMESARTAN (BENICAR) 40 MG TABLET    TAKE 1 TABLET BY MOUTH EVERY DAY   POLYETHYL GLYCOL-PROPYL GLYCOL (SYSTANE) 0.4-0.3 % SOLN    Apply 1 drop to eye daily as needed (for dry eyes).   SILDENAFIL (VIAGRA) 100 MG TABLET    Take  1 tablet (100 mg total) by mouth daily as needed for erectile dysfunction.   TRAZODONE (DESYREL) 50 MG TABLET    TAKE 1 AND 1/2 TABLETS(75 MG) BY MOUTH AT BEDTIME. Please call the office to schedule your physical for more refills.  Modified Medications   No medications on file  Discontinued Medications   AMOXICILLIN (AMOXIL) 875 MG TABLET    Take 1 tablet (875 mg total) by mouth 2 (two) times daily.   DOLUTEGRAVIR-RILPIVIRINE (JULUCA) 50-25 MG TABS    Take 1 tablet by mouth daily.   FLUTICASONE (FLONASE) 50 MCG/ACT NASAL SPRAY    Place 2 sprays into both nostrils daily.   ZIDOVUDINE (RETROVIR) 300 MG TABLET    Take 1 tablet (300 mg total) by mouth 2 (two) times daily.    Subjective: Tilman is in for his routine HIV follow-up visit.  He has had no problems obtaining or taking his Juluca and Retrovir.  He switched to this regimen from [facility] and Retrovir a little over 1 year ago because he could not afford the co-pays.  He has had no problems affording his new regimen but is convinced that it causes him to have excessive flatus and irregular bowel movements.  He wants to try a different regimen.  He says that he never misses doses of his current regimen.  He  has been having left ear pain for the last 6 months and has been seeing Dr. Jodi Marble who thinks this is a probable ruptured epithelial cyst.  He has been follow-up with him next week for a biopsy.  He is still smoking cigarettes and drinking alcohol.  He drinks beer and vodka but tells me that he does not drink to excess.  He is concerned because he says that "Viagra does not work anymore".  Review of Systems: Review of Systems  Constitutional: Negative for chills, diaphoresis, fever, malaise/fatigue and weight loss.  HENT: Positive for ear pain. Negative for congestion, hearing loss and sore throat.   Respiratory: Negative for cough, sputum production and shortness of breath.   Cardiovascular: Negative for chest pain.   Gastrointestinal: Negative for abdominal pain, diarrhea, heartburn, nausea and vomiting.  Genitourinary: Negative for dysuria and frequency.  Musculoskeletal: Negative for joint pain and myalgias.  Skin: Negative for rash.  Neurological: Negative for dizziness and headaches.  Psychiatric/Behavioral: Negative for depression and substance abuse. The patient is not nervous/anxious.     Past Medical History:  Diagnosis Date  . Blood in stool   . Cluster headache   . Frequent episodic tension-type headache   . HIV infection ([place])   . Hypertension   . Migraine   . Pneumonia   . Stroke [facility])     Social History   Tobacco Use  . Smoking status: Current Every Day Smoker    Packs/day: 0.50    Years: 35.00    Pack years: 17.50    Types: Cigarettes  . Smokeless tobacco: Never Used  . Tobacco comment: slowing down  Substance Use Topics  . Alcohol use: Yes    Alcohol/week: 6.0 standard drinks    Types: 6 Standard drinks or equivalent per week    Comment: no fun drinking by yourself  . Drug use: Yes    Frequency: 7.0 times per week    Types: Marijuana    Family History  Problem Relation Age of Onset  . Arthritis Mother   . Heart disease Mother   . Hypertension Mother   . Diabetes Mother   . Arthritis Father   . Heart disease Father   . Hypertension Father   . Lung cancer Brother   . Colon cancer Neg Hx     Allergies  Allergen Reactions  . Asa [Aspirin] Palpitations    Speeds up heart rate  . Sulfamethoxazole-Trimethoprim Itching    Health Maintenance  Topic Date Due  . COLONOSCOPY  05/22/2008  . TETANUS/TDAP  12/13/2025  . INFLUENZA VACCINE  Completed  . Hepatitis C Screening  Completed  . HIV Screening  Completed    Objective:  Vitals:   06/08/19 1537  BP: (!) 151/89  Pulse: (!) 58  Temp: 98.2 F (36.8 C)  Weight: 117 lb (53.1 kg)   Body mass index is 20.08 kg/m.  Physical Exam Constitutional:      Comments: He is pleasant and matter-of-fact as  usual.  HENT:     Ears:     Comments: He has cotton in his left external canal.    Mouth/Throat:     Pharynx: No oropharyngeal exudate.  Cardiovascular:     Rate and Rhythm: Normal rate and regular rhythm.     Heart sounds: Normal heart sounds. No murmur.  Pulmonary:     Effort: Pulmonary effort is normal.     Breath sounds: Normal breath sounds.  Abdominal:     General: There is no  distension.     Palpations: Abdomen is soft.  Musculoskeletal:        General: No swelling or tenderness.  Skin:    Findings: No rash.  Neurological:     General: No focal deficit present.  Psychiatric:        Mood and Affect: Mood normal.     Lab Results Lab Results  Component Value Date   WBC 3.7 (L) 12/03/2018   HGB 12.7 (L) 12/03/2018   HCT 36.5 (L) 12/03/2018   MCV 120.6 Repeated and verified X2. (H) 12/03/2018   PLT 245.0 12/03/2018    Lab Results  Component Value Date   CREATININE 0.86 12/03/2018   BUN 12 12/03/2018   NA 137 12/03/2018   K 4.0 12/03/2018   CL 103 12/03/2018   CO2 25 12/03/2018    Lab Results  Component Value Date   ALT 17 12/03/2018   AST 21 12/03/2018   ALKPHOS 72 12/03/2018   BILITOT 0.3 12/03/2018    Lab Results  Component Value Date   CHOL 158 12/03/2018   HDL 45.00 12/03/2018   LDLCALC 90 12/03/2018   TRIG 117.0 12/03/2018   CHOLHDL 4 12/03/2018   Lab Results  Component Value Date   LABRPR NON-REACTIVE 10/29/2018   HIV 1 RNA Quant (copies/mL)  Date Value  05/27/2019 <20 NOT DETECTED  10/29/2018 38 (H)  04/02/2018 <20 NOT DETECTED   CD4 T Cell Abs (/uL)  Date Value  05/27/2019 1,128  10/29/2018 670  10/30/2017 970     Problem List Items Addressed This Visit      High   CIGARETTE SMOKER    I talked to him again about the importance of considering quitting smoking particularly in light of his prior CVA and current concerns about erectile dysfunction.        Medium   Human immunodeficiency virus (HIV) disease ([place])    His  infection remains under excellent, long-term control.  I am not sure that Juluca is the cause of his flatus but will switch him to [facility] and [name] to see if he improves.  We switched to his current regimen last year because he was having difficulty with his co-pay for [facility].  He will have co-pay cards for both Biktarvy and [name].  He is no longer using Flonase which could have interacted with the cobicistat component of [name].  He has already received his influenza vaccine.  He will follow-up here in 6 weeks.      Relevant Medications   bictegravir-emtricitabine-tenofovir AF (BIKTARVY) 50-200-25 MG TABS tablet   darunavir-cobicistat ([name]) 800-150 MG tablet     Unprioritized   Ruptured epithelial cyst   ERECTILE DYSFUNCTION    I suggested that he talk to his PCP about his concerns.           Michel Bickers, MD [facility] for [place] [facility] [phone] pager   [phone] cell 06/08/2019, 4:22 PM

## 2019-06-09 MED FILL — PREZCOBIX 800 MG-150 MG TAB: 800-150 | 30 days supply | Qty: 30 | Fill #0

## 2019-06-09 MED FILL — BIKTARVY 50-200-25 MG TABS: 50-200-25 | 30 days supply | Qty: 30 | Fill #0

## 2019-06-10 ENCOUNTER — Other Ambulatory Visit: Payer: Self-pay | Admitting: General Practice

## 2019-06-10 MED ORDER — METOPROLOL SUCCINATE ER 50 MG PO TB24: ORAL_TABLET | ORAL | 0 refills | Status: DC

## 2019-06-17 DIAGNOSIS — L989 Disorder of the skin and subcutaneous tissue, unspecified: Secondary | ICD-10-CM | POA: Diagnosis not present

## 2019-06-17 DIAGNOSIS — H6042 Cholesteatoma of left external ear: Secondary | ICD-10-CM | POA: Diagnosis not present

## 2019-07-04 ENCOUNTER — Telehealth: Payer: Self-pay | Admitting: Family Medicine

## 2019-07-04 ENCOUNTER — Other Ambulatory Visit: Payer: Self-pay | Admitting: General Practice

## 2019-07-04 MED ORDER — TRAZODONE HCL 50 MG PO TABS: ORAL_TABLET | ORAL | 0 refills | Status: DC

## 2019-07-04 MED FILL — PREZCOBIX 800 MG-150 MG TAB: 800-150 | 30 days supply | Qty: 30 | Fill #1

## 2019-07-04 MED FILL — BIKTARVY 50-200-25 MG TABS: 50-200-25 | 30 days supply | Qty: 30 | Fill #1

## 2019-07-04 NOTE — Telephone Encounter (Signed)
Made pt aware declined to schedule cpe at this time

## 2019-07-04 NOTE — Telephone Encounter (Signed)
Medication filled to pharmacy as requested.  He just needs to have a CPE scheduled.

## 2019-07-04 NOTE — Telephone Encounter (Signed)
Pt called in asking why his trazodone is getting denied, he states that he has been trying for a wk to get the medication refilled. Please advise and pt can be reached at the home #

## 2019-07-06 ENCOUNTER — Telehealth: Payer: Self-pay | Admitting: *Deleted

## 2019-07-06 DIAGNOSIS — H938X2 Other specified disorders of left ear: Secondary | ICD-10-CM | POA: Diagnosis not present

## 2019-07-06 DIAGNOSIS — H6042 Cholesteatoma of left external ear: Secondary | ICD-10-CM | POA: Diagnosis not present

## 2019-07-06 DIAGNOSIS — F17210 Nicotine dependence, cigarettes, uncomplicated: Secondary | ICD-10-CM | POA: Diagnosis not present

## 2019-07-06 NOTE — Telephone Encounter (Signed)
Patient called to report that yesterday about 8 pm he had noticeable swelling in both legs from the knee down. He advised this morning the Left leg is back to normal but the Right is still slightly swollen. He denies any pain, redness, or pain, just the swelling. Patient thinks this is related to his HIV medication Biktarvy and [name] and is refusing to take anymore until we tell him different. He states the medication change is the only thing different in his life. He still has the Juluca on hand and wants to restart that medication instead. Advised him will let the provider know and give him a call back.

## 2019-07-06 NOTE — Telephone Encounter (Signed)
Per verbal from Dr Megan Salon called the patient to have him come for a visit to discuss possible issues with medication. He is scheduled to come 07/07/19 at 915 am.

## 2019-07-07 ENCOUNTER — Ambulatory Visit (INDEPENDENT_AMBULATORY_CARE_PROVIDER_SITE_OTHER): Payer: BC Managed Care – PPO | Admitting: Internal Medicine

## 2019-07-07 ENCOUNTER — Encounter: Payer: Self-pay | Admitting: Internal Medicine

## 2019-07-07 ENCOUNTER — Other Ambulatory Visit: Payer: Self-pay

## 2019-07-07 ENCOUNTER — Other Ambulatory Visit: Payer: Self-pay | Admitting: General Practice

## 2019-07-07 VITALS — BP 136/89 | HR 55 | Temp 98.3°F | Wt 117.0 lb

## 2019-07-07 DIAGNOSIS — B20 Human immunodeficiency virus [HIV] disease: Secondary | ICD-10-CM | POA: Diagnosis not present

## 2019-07-07 MED ORDER — OLMESARTAN MEDOXOMIL 40 MG PO TABS: 40.0000 mg | ORAL_TABLET | Freq: Every day | ORAL | 0 refills | Status: DC

## 2019-07-07 NOTE — Progress Notes (Signed)
Derak came in for a work in visit today.  He was concerned that some ankle swelling may have been related to his Biktarvy or [name].  I had him meet with our ID pharmacist.  We all told him that it is unlikely that his medications are causing the swelling.  He agrees to continue his current regimen.  He will follow-up in 3 months.

## 2019-07-07 NOTE — Progress Notes (Signed)
HPI: Daniel Gonzalez is a 63 y.o. male who presents to the [facility] for HIV follow-up.  Patient Active Problem List   Diagnosis Date Noted  . Ruptured epithelial cyst 06/08/2019  . B12 deficiency 10/23/2017  . Anemia 08/24/2015  . Cerebral aneurysm, nonruptured 09/02/2013  . History of CVA (cerebrovascular accident) 06/22/2013  . Hypopigmentation 06/20/2013  . Routine general medical examination at a health care facility 01/07/2013  . Essential hypertension 05/01/2010  . CARPAL TUNNEL SYNDROME 01/02/2010  . HEADACHE, CHRONIC, HX OF 01/02/2009  . Human immunodeficiency virus (HIV) disease ([place]) 09/17/2006  . GENITAL HERPES 09/17/2006  . ERECTILE DYSFUNCTION 09/17/2006  . 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 CIGARETTE SMOKER 09/17/2006  . DIVERTICULOSIS, COLON W/O HEM 09/17/2006    Patient's Medications  New Prescriptions   No medications on file  Previous Medications   ACETAMINOPHEN (TYLENOL) 650 MG CR TABLET    Take 1,300 mg by mouth every 8 (eight) hours as needed for pain.   AMLODIPINE (NORVASC) 10 MG TABLET    TAKE 1 TABLET(10 MG) BY MOUTH DAILY   BICTEGRAVIR-EMTRICITABINE-TENOFOVIR AF (BIKTARVY) 50-200-25 MG TABS TABLET    Take 1 tablet by mouth daily.   CIPROFLOXACIN (CIPRO) 500 MG TABLET       CLOPIDOGREL (PLAVIX) 75 MG TABLET    TAKE 1 TABLET BY MOUTH EVERY DAY WITH BREAKFAST   DARUNAVIR-COBICISTAT ([name]) 800-150 MG TABLET    Take 1 tablet by mouth daily. Swallow whole. Do NOT crush, break or chew tablets. Take with food.   METOPROLOL SUCCINATE (TOPROL-XL) 50 MG 24 HR TABLET    TAKE 1 TABLET(50 MG) BY MOUTH DAILY   MISC. DEVICES (PILL SPLITTER) MISC    Please use to cut trazodone in 1/2.   NEOMYCIN-POLYMYXIN-HYDROCORTISONE (CORTISPORIN) 3.5-10000-1 OTIC SUSPENSION    Place 3 drops into the left ear 2 (two) times daily.   OFLOXACIN (FLOXIN) 0.3 % OTIC SOLUTION    Apply 10 drops to the affected ear(s) daily for 7 days.   OLMESARTAN (BENICAR) 40 MG  TABLET    TAKE 1 TABLET BY MOUTH EVERY DAY   POLYETHYL GLYCOL-PROPYL GLYCOL (SYSTANE) 0.4-0.3 % SOLN    Apply 1 drop to eye daily as needed (for dry eyes).   SILDENAFIL (VIAGRA) 100 MG TABLET    Take 1 tablet (100 mg total) by mouth daily as needed for erectile dysfunction.   TRAZODONE (DESYREL) 50 MG TABLET    TAKE 1 AND 1/2 TABLETS(75 MG) BY MOUTH AT BEDTIME. Please call the office to schedule your physical for more refills.  Modified Medications   No medications on file  Discontinued Medications   No medications on file    Allergies: Allergies  Allergen Reactions  . Asa [Aspirin] Palpitations    Speeds up heart rate  . Sulfamethoxazole-Trimethoprim Itching    Past Medical History: Past Medical History:  Diagnosis Date  . Blood in stool   . Cluster headache   . Frequent episodic tension-type headache   . HIV infection ([place])   . Hypertension   . Migraine   . Pneumonia   . Stroke [facility])     Social History: Social History   Socioeconomic History  . Marital status: Divorced    Spouse name: Not on file  . Number of children: 3  . Years of education: HS  . Highest education level: Not on file  Occupational History  . Occupation: Firefighter: [employer]  Social Needs  . Financial  resource strain: Not on file  . Food insecurity    Worry: Not on file    Inability: Not on file  . Transportation needs    Medical: Not on file    Non-medical: Not on file  Tobacco Use  . Smoking status: Current Every Day Smoker    Packs/day: 0.50    Years: 35.00    Pack years: 17.50    Types: Cigarettes  . Smokeless tobacco: Never Used  . Tobacco comment: slowing down  Substance and Sexual Activity  . Alcohol use: Yes    Alcohol/week: 6.0 standard drinks    Types: 6 Standard drinks or equivalent per week    Comment: no fun drinking by yourself; liquor  . Drug use: Yes    Frequency: 7.0 times per week    Types: Marijuana    Comment: "few times a week"  . Sexual  activity: Yes    Partners: Female    Birth control/protection: Condom    Comment: declined condoms, has at home 06/2019  Lifestyle  . Physical activity    Days per week: Not on file    Minutes per session: Not on file  . Stress: Not on file  Relationships  . Social Herbalist on phone: Not on file    Gets together: Not on file    Attends religious service: Not on file    Active member of club or organization: Not on file    Attends meetings of clubs or organizations: Not on file    Relationship status: Not on file  Other Topics Concern  . Not on file  Social History Narrative   Patient lives at home alone.   Caffeine Use: 1 cup occasionally    Labs: Lab Results  Component Value Date   HIV1RNAQUANT <20 NOT DETECTED 05/27/2019   HIV1RNAQUANT 38 (H) 10/29/2018   HIV1RNAQUANT <20 NOT DETECTED 04/02/2018   CD4TABS 1,128 05/27/2019   CD4TABS 670 10/29/2018   CD4TABS 970 10/30/2017    RPR and STI Lab Results  Component Value Date   LABRPR NON-REACTIVE 10/29/2018   LABRPR NON REAC 05/30/2016   LABRPR NON REAC 11/23/2014   LABRPR NON REAC 10/07/2013   LABRPR NON REACTIVE 06/23/2013    No flowsheet data found.  Hepatitis B Lab Results  Component Value Date   HEPBSAB No 10/12/2006   HEPBSAG No 10/12/2006   Hepatitis C No results found for: HEPCAB, HCVRNAPCRQN Hepatitis A No results found for: HAV Lipids: Lab Results  Component Value Date   CHOL 158 12/03/2018   TRIG 117.0 12/03/2018   HDL 45.00 12/03/2018   CHOLHDL 4 12/03/2018   VLDL 23.4 12/03/2018   [place] 90 12/03/2018    Current HIV Regimen: Biktarvy and [name]  Assessment: The pharmacy team was asked by Dr. Megan Salon to see Daniel Gonzalez today regarding some issues he is having with his medications. Daniel Gonzalez has some known resistance is currently on a two drug regimen of Biktarvy and [name]. He was changed to this regimen from [employer] and [name] due to complaints of passing gas frequently. He is in  today with concerns over some leg swelling that he believes is due to his new HIV regimen. His legs are not terribly swollen, only slight pitting edema when he rolls his socks down but it is very minimal. This is not a commonly known side effect of [name] or Biktarvy. Of note, the patient reports eating fast food for nearly every meal of everyday. He also has other  issues such as heartburn and frequent loose stools. I believe all of these issues are due to his diet. We talked about making some diet changes and he was amendable to this and says he will try. We will start with trying to do at least one less fast food meal a day and see if that help. It is not ideal to keep changing regimens especially in a patient with known resistance. We would also have to avoid his old regimen because he uses antacids for his heartburn which is contraindicated with the rilpivirine in [place]. If he continues to have these issues going forward after making lifestyle changes we will have to get creative with a new regimen. For now the patient is agreeable to continue on his current regimen and see how he tolerates.  Plan: - Continue Biktarvy and [name] - Follow up with Dr. Megan Salon 10/06/2019   Nicoletta Dress, PharmD PGY2 Infectious Disease Pharmacy Resident  [place] for Infectious Disease 07/07/2019, 10:17 AM

## 2019-07-21 ENCOUNTER — Ambulatory Visit: Payer: BC Managed Care – PPO | Admitting: Internal Medicine

## 2019-07-29 DIAGNOSIS — H6042 Cholesteatoma of left external ear: Secondary | ICD-10-CM | POA: Diagnosis not present

## 2019-07-29 DIAGNOSIS — F17210 Nicotine dependence, cigarettes, uncomplicated: Secondary | ICD-10-CM | POA: Diagnosis not present

## 2019-08-01 ENCOUNTER — Other Ambulatory Visit: Payer: Self-pay | Admitting: General Practice

## 2019-08-01 MED ORDER — TRAZODONE HCL 50 MG PO TABS: ORAL_TABLET | ORAL | 0 refills | Status: DC

## 2019-08-01 MED FILL — PREZCOBIX 800 MG-150 MG TAB: 800-150 | 30 days supply | Qty: 30 | Fill #2

## 2019-08-01 MED FILL — BIKTARVY 50-200-25 MG TABS: 50-200-25 | 30 days supply | Qty: 30 | Fill #2

## 2019-08-18 ENCOUNTER — Other Ambulatory Visit: Payer: Self-pay | Admitting: Emergency Medicine

## 2019-08-18 DIAGNOSIS — I10 Essential (primary) hypertension: Secondary | ICD-10-CM

## 2019-08-18 MED ORDER — AMLODIPINE BESYLATE 10 MG PO TABS: ORAL_TABLET | ORAL | 0 refills | Status: DC

## 2019-08-19 HISTORY — PX: PROSTATE BIOPSY: SHX241

## 2019-08-31 ENCOUNTER — Telehealth: Payer: Self-pay | Admitting: Pharmacy Technician

## 2019-08-31 MED FILL — PREZCOBIX 800 MG-150 MG TAB: 800-150 | 30 days supply | Qty: 30 | Fill #3

## 2019-08-31 MED FILL — BIKTARVY 50-200-25 MG TABS: 50-200-25 | 30 days supply | Qty: 30 | Fill #3

## 2019-08-31 NOTE — Telephone Encounter (Signed)
[facility] Patient Advocate Encounter   Was successful in obtaining a [place] copay card for [employer].  This copay card will make the patients copay 0.  The billing information is as follows and has been shared with [place].  RxBin: [id number] PCN: [id number] Member ID: [id number] Group ID: [id number]   [facility] Patient Advocate Encounter   Was successful in obtaining a [place] copay card for [name].  This copay card will make the patients copay 0.  The billing information is as follows and has been shared with [place].  RxBin: [id number] PCN: [id number] Member ID: [id number] Group ID: [id number]  [place] Nadara Mustard [place] Patient [facility] for Infectious Disease Phone: [phone] Fax:  [phone]    Venida Jarvis. Nadara Mustard [place] Patient [facility] for Infectious Disease Phone: [phone] Fax:  [phone]

## 2019-09-05 ENCOUNTER — Telehealth: Payer: Self-pay | Admitting: Family Medicine

## 2019-09-05 NOTE — Telephone Encounter (Signed)
Last CPE 10/23/2017, has BCBS ( unable to verify at this time) Left msg for pt to call, informed refills denied until visit scheduled ( in office or virtual per Jess).

## 2019-09-05 NOTE — Telephone Encounter (Signed)
These were denied. Pt is overdue a CPE with PCP.

## 2019-09-05 NOTE — Telephone Encounter (Signed)
Pt called, contacted pharmacy last Wednesday about 2 refills ( Plavix and Trazadone ) and pharmacy advised that we have not responded to their request.  Pt [place]

## 2019-09-07 ENCOUNTER — Encounter: Payer: Self-pay | Admitting: Family Medicine

## 2019-09-07 ENCOUNTER — Other Ambulatory Visit: Payer: Self-pay | Admitting: Internal Medicine

## 2019-09-07 ENCOUNTER — Ambulatory Visit (INDEPENDENT_AMBULATORY_CARE_PROVIDER_SITE_OTHER): Payer: BC Managed Care – PPO | Admitting: Family Medicine

## 2019-09-07 ENCOUNTER — Telehealth: Payer: Self-pay

## 2019-09-07 ENCOUNTER — Other Ambulatory Visit: Payer: Self-pay

## 2019-09-07 VITALS — BP 138/82 | HR 63 | Temp 97.5°F | Resp 16 | Ht 64.0 in | Wt 123.6 lb

## 2019-09-07 DIAGNOSIS — K529 Noninfective gastroenteritis and colitis, unspecified: Secondary | ICD-10-CM | POA: Insufficient documentation

## 2019-09-07 DIAGNOSIS — B20 Human immunodeficiency virus [HIV] disease: Secondary | ICD-10-CM | POA: Diagnosis not present

## 2019-09-07 DIAGNOSIS — R197 Diarrhea, unspecified: Secondary | ICD-10-CM

## 2019-09-07 DIAGNOSIS — E538 Deficiency of other specified B group vitamins: Secondary | ICD-10-CM

## 2019-09-07 DIAGNOSIS — Z0000 Encounter for general adult medical examination without abnormal findings: Secondary | ICD-10-CM

## 2019-09-07 DIAGNOSIS — Z125 Encounter for screening for malignant neoplasm of prostate: Secondary | ICD-10-CM

## 2019-09-07 DIAGNOSIS — I10 Essential (primary) hypertension: Secondary | ICD-10-CM

## 2019-09-07 DIAGNOSIS — Z1211 Encounter for screening for malignant neoplasm of colon: Secondary | ICD-10-CM

## 2019-09-07 MED ORDER — TRAZODONE HCL 50 MG PO TABS: ORAL_TABLET | ORAL | 6 refills | Status: DC

## 2019-09-07 MED ORDER — CLOPIDOGREL BISULFATE 75 MG PO TABS: 75.0000 mg | ORAL_TABLET | Freq: Every day | ORAL | 1 refills | Status: DC

## 2019-09-07 NOTE — Assessment & Plan Note (Signed)
Following w/ Dr Megan Salon.  Current meds are causing diarrhea and pt is very frustrated.  Encouraged him to speak w/ ID provider.

## 2019-09-07 NOTE — Telephone Encounter (Signed)
Spoke with Coralyn Mark, relayed Dr Hale Bogus plan. He will pick up stool kit at front desk. Landis Gandy, RN

## 2019-09-07 NOTE — Telephone Encounter (Signed)
I put in orders for C. difficile and GI pathogen panel testing.  Please ask Kolbe to come in to give a stool sample.  Please tell him that I am not convinced his chronic diarrhea is due to his medication.

## 2019-09-07 NOTE — Patient Instructions (Addendum)
Follow up in 6 months to recheck BP We'll notify you of your lab results and make any changes if needed Make sure you are eating regularly- at least 3 meals/day and snacks Complete and return the cologuard as directed Call with any questions or concerns Stay Safe!  Stay Healthy!

## 2019-09-07 NOTE — Progress Notes (Signed)
   Subjective:    Patient ID: Daniel Gonzalez, male    DOB: 1956/07/04, 64 y.o.   MRN: [id number]  HPI CPE- UTD on Tdap, flu.  Due for colonoscopy- 'I don't want to'.  Willing to do cologuard   Review of Systems Patient reports no vision/hearing changes, anorexia, fever ,adenopathy, persistant/recurrent hoarseness, swallowing issues, chest pain, palpitations, edema, hemoptysis, dyspnea (rest,exertional, paroxysmal nocturnal), gastrointestinal  bleeding (melena, rectal bleeding), abdominal pain, excessive heart burn, GU symptoms (dysuria, hematuria, voiding/incontinence issues) syncope, focal weakness, memory loss, numbness & tingling, skin/hair/nail changes, depression, anxiety, abnormal bruising/bleeding, musculoskeletal symptoms/signs.   + diarrhea due to HIV meds + chronic cough  This visit occurred during the SARS-CoV-2 public health emergency.  Safety protocols were in place, including screening questions prior to the visit, additional usage of staff PPE, and extensive cleaning of exam room while observing appropriate contact time as indicated for disinfecting solutions.       Objective:   Physical Exam General Appearance:    Alert, cooperative, no distress, appears stated age.  Very thin.  Head:    Normocephalic, without obvious abnormality, atraumatic  Eyes:    PERRL, conjunctiva/corneas clear, EOM's intact, fundi    benign, both eyes       Ears:    L TM and canal remains abnormal (sees ENT), R TM and canal WNL  Nose:   Deferred due to COVID  Throat:   Neck:   Supple, symmetrical, trachea midline, no adenopathy;       thyroid:  No enlargement/tenderness/nodules  Back:     Symmetric, no curvature, ROM normal, no CVA tenderness  Lungs:     Clear to auscultation bilaterally, respirations unlabored  Chest wall:    No tenderness or deformity  Heart:    Regular rate and rhythm, S1 and S2 normal, no murmur, rub   or gallop  Abdomen:     Soft, non-tender, bowel sounds active all four  quadrants,    no masses, no organomegaly  Genitalia:    Deferred at pt's request  Rectal:    Extremities:   Extremities normal, atraumatic, no cyanosis or edema  Pulses:   2+ and symmetric all extremities  Skin:   Skin color, texture, turgor normal, no rashes or lesions  Lymph nodes:   Cervical, supraclavicular, and axillary nodes normal  Neurologic:   CNII-XII intact. Normal strength, sensation and reflexes      throughout          Assessment & Plan:

## 2019-09-07 NOTE — Telephone Encounter (Signed)
Patient called stating that he is continuing to have severe diarrhea despite medication change. Stated that he has had diarrhea 5 times today already; "I'm afraid to even pass gas because I don't know what's going to come out..."; also stated he's woken up and had accidents in bed. Reports that it's happening daily within the last week. Prior to this week it was only happening a few times a week. States the diarrhea has worsened since starting his new regimen. Let the patient know that Dr. Megan Salon would be informed as well as the pharmacist, Cassie.   Iliyana Convey Lorita Officer, RN

## 2019-09-07 NOTE — Assessment & Plan Note (Signed)
Pt has hx of this.  Check labs and replete prn. 

## 2019-09-07 NOTE — Assessment & Plan Note (Signed)
Pt's PE unchanged from previous.  He remains very thin, almost cachectic.  Reports he is eating regularly.  Refuses colonoscopy.  Agreeable to cologuard.  Check labs.  Anticipatory guidance provided.

## 2019-09-07 NOTE — Assessment & Plan Note (Signed)
Chronic problem.  Adequate control.  Asymptomatic.  Check labs.  No anticipated med changes 

## 2019-09-07 NOTE — Telephone Encounter (Signed)
CPE scheduled for 09/07/2019.

## 2019-09-08 ENCOUNTER — Other Ambulatory Visit: Payer: Self-pay | Admitting: Family Medicine

## 2019-09-08 ENCOUNTER — Other Ambulatory Visit: Payer: Self-pay | Admitting: General Practice

## 2019-09-08 DIAGNOSIS — R972 Elevated prostate specific antigen [PSA]: Secondary | ICD-10-CM

## 2019-09-08 LAB — PSA: PSA: 5.17 ng/mL — ABNORMAL HIGH (ref 0.10–4.00)

## 2019-09-08 LAB — BASIC METABOLIC PANEL
BUN: 11 mg/dL (ref 6–23)
CO2: 26 mEq/L (ref 19–32)
Calcium: 9.5 mg/dL (ref 8.4–10.5)
Chloride: 103 mEq/L (ref 96–112)
Creatinine, Ser: 0.75 mg/dL (ref 0.40–1.50)
GFR: 127.98 mL/min (ref >60.00)
Glucose, Bld: 83 mg/dL (ref 70–99)
Potassium: 3.5 mEq/L (ref 3.5–5.1)
Sodium: 135 mEq/L (ref 135–145)

## 2019-09-08 LAB — LIPID PANEL
Cholesterol: 193 mg/dL (ref 0–200)
HDL: 57.1 mg/dL (ref >39.00)
LDL Cholesterol: 116 mg/dL — ABNORMAL HIGH (ref 0–99)
NonHDL: 135.46
Total CHOL/HDL Ratio: 3
Triglycerides: 99 mg/dL (ref 0.0–149.0)
VLDL: 19.8 mg/dL (ref 0.0–40.0)

## 2019-09-08 LAB — CBC WITH DIFFERENTIAL/PLATELET
Basophils Absolute: 0 10*3/uL (ref 0.0–0.1)
Basophils Relative: 0.8 % (ref 0.0–3.0)
Eosinophils Absolute: 0.1 10*3/uL (ref 0.0–0.7)
Eosinophils Relative: 1.2 % (ref 0.0–5.0)
HCT: 38.1 % — ABNORMAL LOW (ref 39.0–52.0)
Hemoglobin: 13.1 g/dL (ref 13.0–17.0)
Lymphocytes Relative: 46.3 % — ABNORMAL HIGH (ref 12.0–46.0)
Lymphs Abs: 2.1 10*3/uL (ref 0.7–4.0)
MCHC: 34.3 g/dL (ref 30.0–36.0)
MCV: 109.8 fl — ABNORMAL HIGH (ref 78.0–100.0)
Monocytes Absolute: 0.5 10*3/uL (ref 0.1–1.0)
Monocytes Relative: 11 % (ref 3.0–12.0)
Neutro Abs: 1.8 10*3/uL (ref 1.4–7.7)
Neutrophils Relative %: 40.7 % — ABNORMAL LOW (ref 43.0–77.0)
Platelets: 199 10*3/uL (ref 150.0–400.0)
RBC: 3.47 Mil/uL — ABNORMAL LOW (ref 4.22–5.81)
RDW: 13.6 % (ref 11.5–15.5)
WBC: 4.5 10*3/uL (ref 4.0–10.5)

## 2019-09-08 LAB — TSH: TSH: 1.61 u[IU]/mL (ref 0.35–4.50)

## 2019-09-08 LAB — B12 AND FOLATE PANEL
Folate: 7.2 ng/mL (ref >5.9)
Vitamin B-12: 267 pg/mL (ref 211–911)

## 2019-09-08 LAB — HEPATIC FUNCTION PANEL
ALT: 8 U/L (ref 0–53)
AST: 22 U/L (ref 0–37)
Albumin: 4.3 g/dL (ref 3.5–5.2)
Alkaline Phosphatase: 70 U/L (ref 39–117)
Bilirubin, Direct: 0.1 mg/dL (ref 0.0–0.3)
Total Bilirubin: 0.4 mg/dL (ref 0.2–1.2)
Total Protein: 7.5 g/dL (ref 6.0–8.3)

## 2019-09-08 MED ORDER — METOPROLOL SUCCINATE ER 50 MG PO TB24: ORAL_TABLET | ORAL | 0 refills | Status: DC

## 2019-09-16 ENCOUNTER — Telehealth: Payer: Self-pay

## 2019-09-16 NOTE — Telephone Encounter (Signed)
Patient walked into clinic today to pick up stool kit. Instructed patient on how to do lab at home. Patient does not have any questions at this time; will try to bring in sample next week after work. [place]

## 2019-09-19 ENCOUNTER — Other Ambulatory Visit: Payer: Self-pay | Admitting: General Practice

## 2019-09-19 DIAGNOSIS — I10 Essential (primary) hypertension: Secondary | ICD-10-CM

## 2019-09-19 MED ORDER — AMLODIPINE BESYLATE 10 MG PO TABS: ORAL_TABLET | ORAL | 1 refills | Status: DC

## 2019-09-22 ENCOUNTER — Other Ambulatory Visit: Payer: BC Managed Care – PPO

## 2019-09-22 ENCOUNTER — Other Ambulatory Visit: Payer: Self-pay

## 2019-09-22 DIAGNOSIS — R197 Diarrhea, unspecified: Secondary | ICD-10-CM | POA: Diagnosis not present

## 2019-09-29 LAB — CLOSTRIDIUM DIFFICILE TOXIN B, QUALITATIVE, REAL-TIME PCR: Toxigenic C. Difficile by PCR: NOT DETECTED

## 2019-09-29 LAB — GASTROINTESTINAL PATHOGEN PANEL PCR
C. difficile Tox A/B, PCR: NOT DETECTED
Campylobacter, PCR: NOT DETECTED
Cryptosporidium, PCR: NOT DETECTED
E coli (ETEC) LT/ST PCR: NOT DETECTED
E coli (STEC) stx1/stx2, PCR: NOT DETECTED
E coli 0157, PCR: NOT DETECTED
Giardia lamblia, PCR: NOT DETECTED
Norovirus, PCR: NOT DETECTED
Rotavirus A, PCR: NOT DETECTED
Salmonella, PCR: NOT DETECTED
Shigella, PCR: NOT DETECTED

## 2019-09-30 ENCOUNTER — Telehealth: Payer: Self-pay

## 2019-09-30 DIAGNOSIS — F17210 Nicotine dependence, cigarettes, uncomplicated: Secondary | ICD-10-CM | POA: Diagnosis not present

## 2019-09-30 DIAGNOSIS — H6042 Cholesteatoma of left external ear: Secondary | ICD-10-CM | POA: Diagnosis not present

## 2019-09-30 MED FILL — PREZCOBIX 800 MG-150 MG TAB: 800-150 | 30 days supply | Qty: 30 | Fill #4

## 2019-09-30 MED FILL — BIKTARVY 50-200-25 MG TABS: 50-200-25 | 30 days supply | Qty: 30 | Fill #4

## 2019-09-30 NOTE — Telephone Encounter (Signed)
Patient left voicemail in triage to return his call. Left patient a voicemail to call the office if he needs anything. Eugenia Mcalpine

## 2019-10-06 ENCOUNTER — Ambulatory Visit: Payer: BC Managed Care – PPO | Admitting: Internal Medicine

## 2019-10-07 ENCOUNTER — Other Ambulatory Visit: Payer: Self-pay | Admitting: General Practice

## 2019-10-07 MED ORDER — OLMESARTAN MEDOXOMIL 40 MG PO TABS: 40.0000 mg | ORAL_TABLET | Freq: Every day | ORAL | 1 refills | Status: DC

## 2019-10-13 ENCOUNTER — Ambulatory Visit: Payer: BC Managed Care – PPO | Admitting: Internal Medicine

## 2019-10-20 ENCOUNTER — Ambulatory Visit (INDEPENDENT_AMBULATORY_CARE_PROVIDER_SITE_OTHER): Payer: BC Managed Care – PPO | Admitting: Internal Medicine

## 2019-10-20 ENCOUNTER — Encounter: Payer: Self-pay | Admitting: Internal Medicine

## 2019-10-20 ENCOUNTER — Other Ambulatory Visit: Payer: Self-pay

## 2019-10-20 DIAGNOSIS — F1010 Alcohol abuse, uncomplicated: Secondary | ICD-10-CM

## 2019-10-20 DIAGNOSIS — B20 Human immunodeficiency virus [HIV] disease: Secondary | ICD-10-CM

## 2019-10-20 DIAGNOSIS — R197 Diarrhea, unspecified: Secondary | ICD-10-CM

## 2019-10-20 NOTE — Assessment & Plan Note (Signed)
He seems to have proven through his own experimentation that his diarrhea is likely related to his heavy alcohol use.

## 2019-10-20 NOTE — Assessment & Plan Note (Signed)
I congratulated him on his success of his little experiment and encouraged him to continue to cut down on his alcohol intake.

## 2019-10-20 NOTE — Assessment & Plan Note (Signed)
His adherence is very good and his infection has been under excellent, long-term control.  I do not think that his diarrhea has been related to his antiretroviral therapy.  He will get repeat blood work today and continue Airline pilot and [name].  I will see him back in 6 weeks.  He will probably be eligible for the Covid vaccine within the next few weeks when the next tier is announced.

## 2019-10-20 NOTE — Progress Notes (Signed)
Patient Active Problem List   Diagnosis Date Noted  . Diarrhea 09/07/2019    Priority: High  . Cerebral aneurysm, nonruptured 09/02/2013    Priority: High  . History of CVA (cerebrovascular accident) 06/22/2013    Priority: High  . Essential hypertension 05/01/2010    Priority: High  . CIGARETTE SMOKER 09/17/2006    Priority: High  . Human immunodeficiency virus (HIV) disease ([place]) 09/17/2006    Priority: Medium  . Ruptured epithelial cyst 06/08/2019  . B12 deficiency 10/23/2017  . Anemia 08/24/2015  . Hypopigmentation 06/20/2013  . Routine general medical examination at a health care facility 01/07/2013  . CARPAL TUNNEL SYNDROME 01/02/2010  . HEADACHE, CHRONIC, HX OF 01/02/2009  . GENITAL HERPES 09/17/2006  . ERECTILE DYSFUNCTION 09/17/2006  . 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 DIVERTICULOSIS, COLON W/O HEM 09/17/2006    Patient's Medications  New Prescriptions   No medications on file  Previous Medications   ACETAMINOPHEN (TYLENOL) 650 MG CR TABLET    Take 1,300 mg by mouth every 8 (eight) hours as needed for pain.   AMLODIPINE (NORVASC) 10 MG TABLET    TAKE 1 TABLET(10 MG) BY MOUTH DAILY   BICTEGRAVIR-EMTRICITABINE-TENOFOVIR AF (BIKTARVY) 50-200-25 MG TABS TABLET    Take 1 tablet by mouth daily.   CLOPIDOGREL (PLAVIX) 75 MG TABLET    Take 1 tablet (75 mg total) by mouth daily.   DARUNAVIR-COBICISTAT ([name]) 800-150 MG TABLET    Take 1 tablet by mouth daily. Swallow whole. Do NOT crush, break or chew tablets. Take with food.   METOPROLOL SUCCINATE (TOPROL-XL) 50 MG 24 HR TABLET    TAKE 1 TABLET(50 MG) BY MOUTH DAILY   MISC. DEVICES (PILL SPLITTER) MISC    Please use to cut trazodone in 1/2.   NEOMYCIN-POLYMYXIN-HYDROCORTISONE (CORTISPORIN) 3.5-10000-1 OTIC SUSPENSION    Place 3 drops into the left ear 2 (two) times daily.   OFLOXACIN (FLOXIN) 0.3 % OTIC SOLUTION    Apply 10 drops to the affected ear(s) daily for 7 days.   OLMESARTAN (BENICAR) 40 MG  TABLET    Take 1 tablet (40 mg total) by mouth daily.   POLYETHYL GLYCOL-PROPYL GLYCOL (SYSTANE) 0.4-0.3 % SOLN    Apply 1 drop to eye daily as needed (for dry eyes).   SILDENAFIL (VIAGRA) 100 MG TABLET    Take 1 tablet (100 mg total) by mouth daily as needed for erectile dysfunction.   TRAZODONE (DESYREL) 50 MG TABLET    TAKE 1 AND 1/2 TABLETS(75 MG) BY MOUTH AT BEDTIME. Please call the office to schedule your physical for more refills.  Modified Medications   No medications on file  Discontinued Medications   No medications on file    Subjective: Daniel Gonzalez is in for his routine HIV follow-up visit.  He changed to [employer] and [name] last year because he was concerned that his previous antiretroviral regimen was causing his chronic diarrhea.  He noted no change in his diarrhea.  I requested stool samples last month and they were negative for all pathogens on a GI panel including C. Difficile.  He has had a long history of heavy alcohol use.  He has been drinking at least a pint of vodka daily.  Recently decided to quit vodka.  He has only been drinking a few beers each evening.  As soon as he quit drinking vodka he noted a dramatic improvement and decrease in his diarrhea.  He denies missing any doses of  his Biktarvy or [name].  He is hoping to get the Covid vaccine soon.  Review of Systems: Review of Systems  Constitutional: Negative for chills, diaphoresis, fever, malaise/fatigue and weight loss.  HENT: Negative for sore throat.   Respiratory: Negative for cough, sputum production and shortness of breath.   Cardiovascular: Negative for chest pain.  Gastrointestinal: Positive for diarrhea. Negative for abdominal pain, heartburn, nausea and vomiting.  Genitourinary: Negative for dysuria and frequency.  Musculoskeletal: Negative for joint pain and myalgias.  Skin: Negative for rash.  Neurological: Negative for dizziness and headaches.  Psychiatric/Behavioral: Positive for substance abuse.  Negative for depression. The patient is not nervous/anxious.     Past Medical History:  Diagnosis Date  . Blood in stool   . Cluster headache   . Frequent episodic tension-type headache   . HIV infection ([place])   . Hypertension   . Migraine   . Pneumonia   . Stroke [facility])     Social History   Tobacco Use  . Smoking status: Current Every Day Smoker    Packs/day: 0.50    Years: 35.00    Pack years: 17.50    Types: Cigarettes  . Smokeless tobacco: Never Used  . Tobacco comment: slowing down  Substance Use Topics  . Alcohol use: Yes    Alcohol/week: 6.0 standard drinks    Types: 6 Standard drinks or equivalent per week    Comment: no fun drinking by yourself; liquor  . Drug use: Yes    Frequency: 7.0 times per week    Types: Marijuana    Comment: "few times a week"    Family History  Problem Relation Age of Onset  . Arthritis Mother   . Heart disease Mother   . Hypertension Mother   . Diabetes Mother   . Arthritis Father   . Heart disease Father   . Hypertension Father   . Lung cancer Brother   . Colon cancer Neg Hx     Allergies  Allergen Reactions  . Asa [Aspirin] Palpitations    Speeds up heart rate  . Sulfamethoxazole-Trimethoprim Itching    Health Maintenance  Topic Date Due  . COLONOSCOPY  05/22/2008  . TETANUS/TDAP  12/13/2025  . INFLUENZA VACCINE  Completed  . Hepatitis C Screening  Completed  . HIV Screening  Completed    Objective:  Vitals:   10/20/19 1552  BP: 133/83  Pulse: 61  Temp: 98.3 F (36.8 C)  TempSrc: Oral  Weight: 119 lb (54 kg)   Body mass index is 20.43 kg/m.  Physical Exam Constitutional:      Comments: He is in good spirits as usual.  Cardiovascular:     Rate and Rhythm: Normal rate and regular rhythm.     Heart sounds: No murmur.  Pulmonary:     Effort: Pulmonary effort is normal.     Breath sounds: Normal breath sounds.  Abdominal:     Palpations: Abdomen is soft.     Tenderness: There is no abdominal  tenderness.  Musculoskeletal:        General: No swelling or tenderness.  Skin:    Findings: No rash.  Neurological:     General: No focal deficit present.  Psychiatric:        Mood and Affect: Mood normal.     Lab Results Lab Results  Component Value Date   WBC 4.5 09/07/2019   HGB 13.1 09/07/2019   HCT 38.1 (L) 09/07/2019   MCV 109.8 (H) 09/07/2019  PLT 199.0 09/07/2019    Lab Results  Component Value Date   CREATININE 0.75 09/07/2019   BUN 11 09/07/2019   NA 135 09/07/2019   K 3.5 09/07/2019   CL 103 09/07/2019   CO2 26 09/07/2019    Lab Results  Component Value Date   ALT 8 09/07/2019   AST 22 09/07/2019   ALKPHOS 70 09/07/2019   BILITOT 0.4 09/07/2019    Lab Results  Component Value Date   CHOL 193 09/07/2019   HDL 57.10 09/07/2019   LDLCALC 116 (H) 09/07/2019   TRIG 99.0 09/07/2019   CHOLHDL 3 09/07/2019   Lab Results  Component Value Date   LABRPR NON-REACTIVE 10/29/2018   HIV 1 RNA Quant (copies/mL)  Date Value  05/27/2019 <20 NOT DETECTED  10/29/2018 38 (H)  04/02/2018 <20 NOT DETECTED   CD4 T Cell Abs (/uL)  Date Value  05/27/2019 1,128  10/29/2018 670  10/30/2017 970     Problem List Items Addressed This Visit      High   Diarrhea    He seems to have proven through his own experimentation that his diarrhea is likely related to his heavy alcohol use.        Medium   Human immunodeficiency virus (HIV) disease ([place])    His adherence is very good and his infection has been under excellent, long-term control.  I do not think that his diarrhea has been related to his antiretroviral therapy.  He will get repeat blood work today and continue Airline pilot and [name].  I will see him back in 6 weeks.  He will probably be eligible for the Covid vaccine within the next few weeks when the next tier is announced.      Relevant Orders   T-helper cell (CD4)- ([facility] clinic only)   HIV-1 RNA quant-no reflex-bld   CBC   Comprehensive metabolic  panel   RPR   Lipid panel     Unprioritized   ABUSE, ALCOHOL, EPISODIC    I congratulated him on his success of his little experiment and encouraged him to continue to cut down on his alcohol intake.           Michel Bickers, MD [facility] [place] [facility] [phone] pager   [phone] cell 10/20/2019, 5:43 PM

## 2019-10-21 LAB — T-HELPER CELL (CD4) - (RCID CLINIC ONLY)
CD4 % Helper T Cell: 32 % — ABNORMAL LOW (ref 33–65)
CD4 T Cell Abs: 757 /uL (ref 400–1790)

## 2019-10-23 LAB — LIPID PANEL
Cholesterol: 189 mg/dL (ref <200)
HDL: 45 mg/dL (ref >=40)
LDL Cholesterol (Calc): 112 mg/dL (calc) — ABNORMAL HIGH
Non-HDL Cholesterol (Calc): 144 mg/dL (calc) — ABNORMAL HIGH (ref <130)
Total CHOL/HDL Ratio: 4.2 (calc) (ref <5.0)
Triglycerides: 197 mg/dL — ABNORMAL HIGH (ref <150)

## 2019-10-23 LAB — RPR: RPR Ser Ql: NONREACTIVE

## 2019-10-23 LAB — CBC
HCT: 35.9 % — ABNORMAL LOW (ref 38.5–50.0)
Hemoglobin: 12.6 g/dL — ABNORMAL LOW (ref 13.2–17.1)
MCH: 35.4 pg — ABNORMAL HIGH (ref 27.0–33.0)
MCHC: 35.1 g/dL (ref 32.0–36.0)
MCV: 100.8 fL — ABNORMAL HIGH (ref 80.0–100.0)
MPV: 9.1 fL (ref 7.5–12.5)
Platelets: 214 10*3/uL (ref 140–400)
RBC: 3.56 10*6/uL — ABNORMAL LOW (ref 4.20–5.80)
RDW: 12.4 % (ref 11.0–15.0)
WBC: 4.6 10*3/uL (ref 3.8–10.8)

## 2019-10-23 LAB — COMPREHENSIVE METABOLIC PANEL
AG Ratio: 1.5 (calc) (ref 1.0–2.5)
ALT: 9 U/L (ref 9–46)
AST: 19 U/L (ref 10–35)
Albumin: 4.3 g/dL (ref 3.6–5.1)
Alkaline phosphatase (APISO): 62 U/L (ref 35–144)
BUN: 15 mg/dL (ref 7–25)
CO2: 26 mmol/L (ref 20–32)
Calcium: 9.3 mg/dL (ref 8.6–10.3)
Chloride: 105 mmol/L (ref 98–110)
Creat: 0.89 mg/dL (ref 0.70–1.25)
Globulin: 2.9 g/dL (calc) (ref 1.9–3.7)
Glucose, Bld: 76 mg/dL (ref 65–99)
Potassium: 3.7 mmol/L (ref 3.5–5.3)
Sodium: 138 mmol/L (ref 135–146)
Total Bilirubin: 0.3 mg/dL (ref 0.2–1.2)
Total Protein: 7.2 g/dL (ref 6.1–8.1)

## 2019-10-23 LAB — HIV-1 RNA QUANT-NO REFLEX-BLD
HIV 1 RNA Quant: <20 copies/mL
HIV-1 RNA Quant, Log: <1.3 Log copies/mL

## 2019-10-26 ENCOUNTER — Telehealth: Payer: Self-pay | Admitting: Pharmacy Technician

## 2019-10-26 NOTE — Telephone Encounter (Addendum)
[facility] Patient Advocate Encounter  Completed and sent [employer] Advancing Access application for [name] for this patient who is uninsured.    Patient is approved 10/26/2019 for up to 12 months.  BIN      [id number] PCN    [id number] GRP    [id number] ID        [id number]   Daniel Gonzalez Patient [name] for Infectious Disease Phone: [phone] Fax:  [phone]    Completed and sent [name] application for [name] for this patient who is uninsured.    Status is approved through 10/28/2020  [name] [name]  PCN: [id number] [name]   Daniel Gonzalez [name] for Infectious Disease Phone: [phone] Fax:  [phone]

## 2019-10-31 MED FILL — BIKTARVY 50-200-25 MG TABS: 50-200-25 | 30 days supply | Qty: 30 | Fill #5

## 2019-10-31 MED FILL — PREZCOBIX 800 MG-150 MG TAB: 800-150 | 30 days supply | Qty: 30 | Fill #5

## 2019-11-24 MED FILL — PREZCOBIX 800 MG-150 MG TAB: 800-150 | 30 days supply | Qty: 30 | Fill #6

## 2019-11-24 MED FILL — BIKTARVY 50-200-25 MG TABS: 50-200-25 | 30 days supply | Qty: 30 | Fill #6

## 2019-11-28 ENCOUNTER — Other Ambulatory Visit: Payer: Self-pay | Admitting: General Practice

## 2019-11-28 MED ORDER — METOPROLOL SUCCINATE ER 50 MG PO TB24: ORAL_TABLET | ORAL | 0 refills | Status: DC

## 2019-11-30 ENCOUNTER — Telehealth: Payer: Self-pay

## 2019-11-30 NOTE — Telephone Encounter (Signed)
Called and advised pt, he stated an understanding

## 2019-11-30 NOTE — Telephone Encounter (Signed)
Patient states that he has been out of work for about 6 weeks now and currently do not have health insurance to cover medication.clopidogrel (PLAVIX) 75 MG tablet $63 dollars and metoprolol succinate (TOPROL-XL) 50 MG 24 hr tablet $107.89. Patient would like to know what are some other options because he can not afford this for medication. I did let the patient know that they have app called goodrx that offer discounts for medicine

## 2019-11-30 NOTE — Telephone Encounter (Signed)
Pt can check into good rx but may need to file for patient assistance through the individual drug companies (typically these forms can be found online).  This may also be something that Pharmacy can help [place] with.Marland KitchenMarland KitchenMarland Kitchen

## 2019-12-19 MED FILL — BIKTARVY 50-200-25 MG TABS: 50-200-25 | 30 days supply | Qty: 30 | Fill #7

## 2019-12-19 MED FILL — PREZCOBIX 800 MG-150 MG TAB: 800-150 | 30 days supply | Qty: 30 | Fill #7

## 2020-01-18 MED FILL — BIKTARVY 50-200-25 MG TABS: 50-200-25 | 30 days supply | Qty: 30 | Fill #8

## 2020-01-18 MED FILL — PREZCOBIX 800 MG-150 MG TAB: 800-150 | 30 days supply | Qty: 30 | Fill #8

## 2020-02-22 MED FILL — BIKTARVY 50-200-25 MG TABS: 50-200-25 | 30 days supply | Qty: 30 | Fill #9

## 2020-02-22 MED FILL — PREZCOBIX 800 MG-150 MG TAB: 800-150 | 30 days supply | Qty: 30 | Fill #9

## 2020-03-01 ENCOUNTER — Telehealth: Payer: Self-pay | Admitting: Family Medicine

## 2020-03-01 DIAGNOSIS — I10 Essential (primary) hypertension: Secondary | ICD-10-CM

## 2020-03-01 MED ORDER — OLMESARTAN MEDOXOMIL 40 MG PO TABS: 40.0000 mg | ORAL_TABLET | Freq: Every day | ORAL | 0 refills | Status: DC

## 2020-03-01 MED ORDER — CLOPIDOGREL BISULFATE 75 MG PO TABS: 75.0000 mg | ORAL_TABLET | Freq: Every day | ORAL | 0 refills | Status: DC

## 2020-03-01 MED ORDER — METOPROLOL SUCCINATE ER 50 MG PO TB24: ORAL_TABLET | ORAL | 0 refills | Status: DC

## 2020-03-01 MED ORDER — AMLODIPINE BESYLATE 10 MG PO TABS: ORAL_TABLET | ORAL | 0 refills | Status: DC

## 2020-03-01 NOTE — Telephone Encounter (Signed)
We can definitely send refills as he has always come to his appointments and I know this must be very hard for him.  Please send 3 month supply to pharmacy and let him know he can always request less medication (30 of the 90) if the cost is too high.  Please have him check [email or web address] for possibly cheaper prices.

## 2020-03-01 NOTE — Telephone Encounter (Signed)
Please advise on refills.  

## 2020-03-01 NOTE — Telephone Encounter (Signed)
Patient notified of PCP recommendations and is agreement and expresses an understanding. Medication filled to pharmacy as requested.

## 2020-03-01 NOTE — Telephone Encounter (Signed)
Pt called in stating that he was going to have to cancel his appt next week with Dr. Birdie Riddle due to not having insurance. He was furloughed from his job and lost his insurance. He states that he can't afford the self pay fee and get his medication, he is blearily making it with his unemployment benefits.   He states that he will be out of his Plavix by next Wednesday.   He has about 15 days of the Olmesartan  20 days of the Metoprolol and amlodipine. He wanted to know if Birdie Riddle would send in refills to the [facility] on [address].  Pt can be reached at the home #

## 2020-03-09 ENCOUNTER — Ambulatory Visit: Payer: BC Managed Care – PPO | Admitting: Family Medicine

## 2020-03-27 MED FILL — PREZCOBIX 800 MG-150 MG TAB: 800-150 | 30 days supply | Qty: 30 | Fill #10

## 2020-03-27 MED FILL — BIKTARVY 50-200-25 MG TABS: 50-200-25 | 30 days supply | Qty: 30 | Fill #10

## 2020-04-11 DIAGNOSIS — R972 Elevated prostate specific antigen [PSA]: Secondary | ICD-10-CM | POA: Diagnosis not present

## 2020-04-11 DIAGNOSIS — N401 Enlarged prostate with lower urinary tract symptoms: Secondary | ICD-10-CM | POA: Diagnosis not present

## 2020-04-11 DIAGNOSIS — R3915 Urgency of urination: Secondary | ICD-10-CM | POA: Diagnosis not present

## 2020-04-11 DIAGNOSIS — R3916 Straining to void: Secondary | ICD-10-CM | POA: Diagnosis not present

## 2020-04-16 ENCOUNTER — Telehealth: Payer: Self-pay

## 2020-04-16 NOTE — Telephone Encounter (Signed)
COVID-19 Pre-Screening Questions:04/16/20  Do you currently have a fever (>100 F), chills or unexplained body aches?NO   Are you currently experiencing new cough, shortness of breath, sore throat, runny nose? NO  .  Have you been in contact with someone that is currently pending confirmation of Covid19 testing or has been confirmed to have the [place] virus? NO **If the patient answers NO to ALL questions -  advise the patient to please call the clinic before coming to the office should any symptoms develop.

## 2020-04-17 ENCOUNTER — Other Ambulatory Visit: Payer: Self-pay

## 2020-04-17 ENCOUNTER — Encounter: Payer: Self-pay | Admitting: Internal Medicine

## 2020-04-17 ENCOUNTER — Ambulatory Visit (INDEPENDENT_AMBULATORY_CARE_PROVIDER_SITE_OTHER): Payer: BC Managed Care – PPO | Admitting: Internal Medicine

## 2020-04-17 DIAGNOSIS — R972 Elevated prostate specific antigen [PSA]: Secondary | ICD-10-CM | POA: Diagnosis not present

## 2020-04-17 DIAGNOSIS — K529 Noninfective gastroenteritis and colitis, unspecified: Secondary | ICD-10-CM

## 2020-04-17 DIAGNOSIS — F320 Major depressive disorder, single episode, mild: Secondary | ICD-10-CM

## 2020-04-17 DIAGNOSIS — B20 Human immunodeficiency virus [HIV] disease: Secondary | ICD-10-CM

## 2020-04-17 DIAGNOSIS — F32A Depression, unspecified: Secondary | ICD-10-CM | POA: Insufficient documentation

## 2020-04-17 DIAGNOSIS — F17200 Nicotine dependence, unspecified, uncomplicated: Secondary | ICD-10-CM

## 2020-04-17 NOTE — Assessment & Plan Note (Signed)
His chronic diarrhea moves when he does not drink vodka or when he decides to drink less alcohol of any type.

## 2020-04-17 NOTE — Progress Notes (Signed)
Patient Active Problem List   Diagnosis Date Noted  . Chronic diarrhea 09/07/2019    Priority: High  . Cerebral aneurysm, nonruptured 09/02/2013    Priority: High  . History of CVA (cerebrovascular accident) 06/22/2013    Priority: High  . Essential hypertension 05/01/2010    Priority: High  . CIGARETTE SMOKER 09/17/2006    Priority: High  . Human immunodeficiency virus (HIV) disease ([place]) 09/17/2006    Priority: Medium  . Depression 04/17/2020  . Elevated PSA 04/17/2020  . Ruptured epithelial cyst 06/08/2019  . B12 deficiency 10/23/2017  . Normocytic anemia 08/24/2015  . Routine general medical examination at a health care facility 01/07/2013  . CARPAL TUNNEL SYNDROME 01/02/2010  . GENITAL HERPES 09/17/2006  . ERECTILE DYSFUNCTION 09/17/2006  . 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 DIVERTICULOSIS, COLON W/O HEM 09/17/2006    Patient's Medications  New Prescriptions   No medications on file  Previous Medications   ACETAMINOPHEN (TYLENOL) 650 MG CR TABLET    Take 1,300 mg by mouth every 8 (eight) hours as needed for pain.   ALFUZOSIN (UROXATRAL) 10 MG 24 HR TABLET    Take 10 mg by mouth daily.   AMLODIPINE (NORVASC) 10 MG TABLET    TAKE 1 TABLET(10 MG) BY MOUTH DAILY   BICTEGRAVIR-EMTRICITABINE-TENOFOVIR AF (BIKTARVY) 50-200-25 MG TABS TABLET    Take 1 tablet by mouth daily.   CLOPIDOGREL (PLAVIX) 75 MG TABLET    Take 1 tablet (75 mg total) by mouth daily.   DARUNAVIR-COBICISTAT ([name]) 800-150 MG TABLET    Take 1 tablet by mouth daily. Swallow whole. Do NOT crush, break or chew tablets. Take with food.   METOPROLOL SUCCINATE (TOPROL-XL) 50 MG 24 HR TABLET    TAKE 1 TABLET(50 MG) BY MOUTH DAILY   MISC. DEVICES (PILL SPLITTER) MISC    Please use to cut trazodone in 1/2.   NEOMYCIN-POLYMYXIN-HYDROCORTISONE (CORTISPORIN) 3.5-10000-1 OTIC SUSPENSION    Place 3 drops into the left ear 2 (two) times daily.   OFLOXACIN (FLOXIN) 0.3 % OTIC SOLUTION    Apply 10  drops to the affected ear(s) daily for 7 days.   OLMESARTAN (BENICAR) 40 MG TABLET    Take 1 tablet (40 mg total) by mouth daily.   POLYETHYL GLYCOL-PROPYL GLYCOL (SYSTANE) 0.4-0.3 % SOLN    Apply 1 drop to eye daily as needed (for dry eyes).   SILDENAFIL (VIAGRA) 100 MG TABLET    Take 1 tablet (100 mg total) by mouth daily as needed for erectile dysfunction.   TRAZODONE (DESYREL) 50 MG TABLET    TAKE 1 AND 1/2 TABLETS(75 MG) BY MOUTH AT BEDTIME. Please call the office to schedule your physical for more refills.  Modified Medications   No medications on file  Discontinued Medications   No medications on file    Subjective: Daniel Gonzalez is in for his routine HIV follow-up visit.  He denies any problems obtaining, taking or tolerating his Biktarvy or [name] and does not recall missing any doses.  He was forward from his job in March and then terminated recently.  He will also follow-up insurance at the end of September.  He has mostly been sitting at home since March.  He did complete his 2 dose Pfizer Covid vaccine series.  He has been feeling somewhat depressed since he lost his job.  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  He is still smoking cigarettes.  His chronic diarrhea persists.  He notes that it will get worse depending on whether or not he is drinking vodka and how much he is drinking.  He hopes to be able to get a prostate biopsy before his insurance runs out.  To have an elevated PSA earlier this year.  He still struggles with erectile dysfunction.  Review of Systems: Review of Systems  Constitutional: Positive for malaise/fatigue and weight loss. Negative for chills, diaphoresis and fever.  HENT: Negative for congestion and sore throat.   Respiratory: Positive for shortness of breath. Negative for cough and sputum production.   Cardiovascular: Negative for chest pain.  Gastrointestinal: Positive for diarrhea. Negative for  abdominal pain, nausea and vomiting.  Genitourinary: Negative for dysuria.  Musculoskeletal: Negative for back pain and joint pain.  Psychiatric/Behavioral: Positive for depression. Negative for substance abuse. The patient is not nervous/anxious.     Past Medical History:  Diagnosis Date  . Blood in stool   . Cluster headache   . Frequent episodic tension-type headache   . HIV infection ([place])   . Hypertension   . Migraine   . Pneumonia   . Stroke [facility])     Social History   Tobacco Use  . Smoking status: Current Every Day Smoker    Packs/day: 1.00    Years: 35.00    Pack years: 35.00    Types: Cigarettes  . Smokeless tobacco: Never Used  . Tobacco comment: slowing down  Vaping Use  . Vaping Use: Never used  Substance Use Topics  . Alcohol use: Yes    Alcohol/week: 14.0 standard drinks    Types: 14 Standard drinks or equivalent per week    Comment: no fun drinking by yourself; liquor  . Drug use: Yes    Frequency: 7.0 times per week    Types: Marijuana    Comment: "few times a week"    Family History  Problem Relation Age of Onset  . Arthritis Mother   . Heart disease Mother   . Hypertension Mother   . Diabetes Mother   . Arthritis Father   . Heart disease Father   . Hypertension Father   . Lung cancer Brother   . Colon cancer Neg Hx     Allergies  Allergen Reactions  . Asa [Aspirin] Palpitations    Speeds up heart rate  . Sulfamethoxazole-Trimethoprim Itching    Health Maintenance  Topic Date Due  . COVID-19 Vaccine (1) Never done  . COLONOSCOPY  Never done  . INFLUENZA VACCINE  03/18/2020  . TETANUS/TDAP  12/13/2025  . Hepatitis C Screening  Completed  . HIV Screening  Completed    Objective:  Vitals:   04/17/20 1441  BP: 114/82  Pulse: 66  Temp: 98.3 F (36.8 C)  SpO2: 100%  Weight: 113 lb (51.3 kg)  Height: 5\' 4"  (1.626 m)   Body mass index is 19.4 kg/m.  Physical Exam Constitutional:      Comments: His weight is down 10  pounds since January.  Cardiovascular:     Rate and Rhythm: Normal rate.     Heart sounds: No murmur heard.   Pulmonary:     Effort: Pulmonary effort is normal.     Breath sounds: Normal breath sounds.  Musculoskeletal:        General: No swelling or tenderness.  Skin:    Findings: No rash.  Neurological:     General: No focal deficit present.  Psychiatric:  Comments: He appears more worried and down than usual.     Lab Results Lab Results  Component Value Date   WBC 4.6 10/20/2019   HGB 12.6 (L) 10/20/2019   HCT 35.9 (L) 10/20/2019   MCV 100.8 (H) 10/20/2019   PLT 214 10/20/2019    Lab Results  Component Value Date   CREATININE 0.89 10/20/2019   BUN 15 10/20/2019   NA 138 10/20/2019   K 3.7 10/20/2019   CL 105 10/20/2019   CO2 26 10/20/2019    Lab Results  Component Value Date   ALT 9 10/20/2019   AST 19 10/20/2019   ALKPHOS 70 09/07/2019   BILITOT 0.3 10/20/2019    Lab Results  Component Value Date   CHOL 189 10/20/2019   HDL 45 10/20/2019   LDLCALC 112 (H) 10/20/2019   TRIG 197 (H) 10/20/2019   CHOLHDL 4.2 10/20/2019   Lab Results  Component Value Date   LABRPR NON-REACTIVE 10/20/2019   HIV 1 RNA Quant (copies/mL)  Date Value  10/20/2019 <20 NOT DETECTED  05/27/2019 <20 NOT DETECTED  10/29/2018 38 (H)   CD4 T Cell Abs (/uL)  Date Value  10/20/2019 757  05/27/2019 1,128  10/29/2018 670     Problem List Items Addressed This Visit      High   CIGARETTE SMOKER    I talked to him again about the implications of continued cigarette smoking and encouraged him to consider cutting down and quitting, especially in light of his previous strokes.      Chronic diarrhea    His chronic diarrhea moves when he does not drink vodka or when he decides to drink less alcohol of any type.        Medium   Human immunodeficiency virus (HIV) disease ([place])    His infection remains under excellent, long-term control.  He will get repeat lab work today,  continue Airline pilot and [name] and follow-up in 1 year.  I will have him meet with our financial counselor to fill out paperwork for the AIDS drug assistance program.      Relevant Orders   T-helper cell (CD4)- ([facility] clinic only)   HIV-1 RNA quant-no reflex-bld     Unprioritized   Elevated PSA   Depression    He is now suffering from mild depression after losing his job.  He is aware that he can see our behavioral health counselor at any time if he would like to.  He declines to schedule a visit at this time.           Michel Bickers, MD [facility] for [facility]ious [place] [facility] [phone] pager   [phone] cell 04/17/2020, 3:13 PM

## 2020-04-17 NOTE — Assessment & Plan Note (Addendum)
His infection remains under excellent, long-term control.  He will get repeat lab work today, continue Airline pilot and [name] and follow-up in 1 year.  I will have him meet with our financial counselor to fill out paperwork for the AIDS drug assistance program.

## 2020-04-17 NOTE — Assessment & Plan Note (Signed)
I talked to him again about the implications of continued cigarette smoking and encouraged him to consider cutting down and quitting, especially in light of his previous strokes.

## 2020-04-17 NOTE — Assessment & Plan Note (Signed)
He is now suffering from mild depression after losing his job.  He is aware that he can see our behavioral health counselor at any time if he would like to.  He declines to schedule a visit at this time.

## 2020-04-18 LAB — T-HELPER CELL (CD4) - (RCID CLINIC ONLY)
CD4 % Helper T Cell: 33 % (ref 33–65)
CD4 T Cell Abs: 579 /uL (ref 400–1790)

## 2020-04-18 MED FILL — BIKTARVY 50-200-25 MG TABS: 50-200-25 | 30 days supply | Qty: 30 | Fill #11

## 2020-04-18 MED FILL — PREZCOBIX 800 MG-150 MG TAB: 800-150 | 30 days supply | Qty: 30 | Fill #11

## 2020-04-19 ENCOUNTER — Ambulatory Visit: Payer: BC Managed Care – PPO | Admitting: Internal Medicine

## 2020-04-19 DIAGNOSIS — H6122 Impacted cerumen, left ear: Secondary | ICD-10-CM | POA: Diagnosis not present

## 2020-04-20 ENCOUNTER — Other Ambulatory Visit: Payer: Self-pay

## 2020-04-20 ENCOUNTER — Ambulatory Visit (INDEPENDENT_AMBULATORY_CARE_PROVIDER_SITE_OTHER): Payer: BC Managed Care – PPO | Admitting: Family Medicine

## 2020-04-20 ENCOUNTER — Encounter: Payer: Self-pay | Admitting: Family Medicine

## 2020-04-20 VITALS — BP 110/76 | HR 72 | Temp 97.7°F | Resp 16 | Ht 64.0 in | Wt 114.0 lb

## 2020-04-20 DIAGNOSIS — R972 Elevated prostate specific antigen [PSA]: Secondary | ICD-10-CM | POA: Diagnosis not present

## 2020-04-20 DIAGNOSIS — E440 Moderate protein-calorie malnutrition: Secondary | ICD-10-CM | POA: Diagnosis not present

## 2020-04-20 DIAGNOSIS — Z23 Encounter for immunization: Secondary | ICD-10-CM | POA: Diagnosis not present

## 2020-04-20 DIAGNOSIS — I10 Essential (primary) hypertension: Secondary | ICD-10-CM | POA: Diagnosis not present

## 2020-04-20 LAB — LIPID PANEL
Cholesterol: 190 mg/dL (ref 0–200)
HDL: 55.9 mg/dL
LDL Cholesterol: 96 mg/dL (ref 0–99)
NonHDL: 133.64
Total CHOL/HDL Ratio: 3
Triglycerides: 187 mg/dL — ABNORMAL HIGH (ref 0.0–149.0)
VLDL: 37.4 mg/dL (ref 0.0–40.0)

## 2020-04-20 LAB — CBC WITH DIFFERENTIAL/PLATELET
Basophils Absolute: 0 10*3/uL (ref 0.0–0.1)
Basophils Relative: 0.3 % (ref 0.0–3.0)
Eosinophils Absolute: 0 10*3/uL (ref 0.0–0.7)
Eosinophils Relative: 0.7 % (ref 0.0–5.0)
HCT: 38 % — ABNORMAL LOW (ref 39.0–52.0)
Hemoglobin: 12.9 g/dL — ABNORMAL LOW (ref 13.0–17.0)
Lymphocytes Relative: 29.6 % (ref 12.0–46.0)
Lymphs Abs: 1.9 10*3/uL (ref 0.7–4.0)
MCHC: 34 g/dL (ref 30.0–36.0)
MCV: 105.3 fl — ABNORMAL HIGH (ref 78.0–100.0)
Monocytes Absolute: 0.5 10*3/uL (ref 0.1–1.0)
Monocytes Relative: 8 % (ref 3.0–12.0)
Neutro Abs: 4 10*3/uL (ref 1.4–7.7)
Neutrophils Relative %: 61.4 % (ref 43.0–77.0)
Platelets: 186 10*3/uL (ref 150.0–400.0)
RBC: 3.61 Mil/uL — ABNORMAL LOW (ref 4.22–5.81)
RDW: 14.6 % (ref 11.5–15.5)
WBC: 6.5 10*3/uL (ref 4.0–10.5)

## 2020-04-20 LAB — HEPATIC FUNCTION PANEL
ALT: 7 U/L (ref 0–53)
AST: 16 U/L (ref 0–37)
Albumin: 4.5 g/dL (ref 3.5–5.2)
Alkaline Phosphatase: 55 U/L (ref 39–117)
Bilirubin, Direct: 0.1 mg/dL (ref 0.0–0.3)
Total Bilirubin: 0.4 mg/dL (ref 0.2–1.2)
Total Protein: 7.5 g/dL (ref 6.0–8.3)

## 2020-04-20 LAB — BASIC METABOLIC PANEL WITH GFR
BUN: 13 mg/dL (ref 6–23)
CO2: 23 meq/L (ref 19–32)
Calcium: 9.8 mg/dL (ref 8.4–10.5)
Chloride: 103 meq/L (ref 96–112)
Creatinine, Ser: 1.05 mg/dL (ref 0.40–1.50)
GFR: 86.62 mL/min
Glucose, Bld: 102 mg/dL — ABNORMAL HIGH (ref 70–99)
Potassium: 3.9 meq/L (ref 3.5–5.1)
Sodium: 136 meq/L (ref 135–145)

## 2020-04-20 LAB — TSH: TSH: 1.8 u[IU]/mL (ref 0.35–4.50)

## 2020-04-20 NOTE — Progress Notes (Signed)
° °  Subjective:    Patient ID: Daniel Gonzalez, male    DOB: [birth year], 64 y.o.   MRN: [id number]  HPI HTN- chronic problem, on Amlodipine 10mg  daily, Metoprolol 50mg  daily, Olmesartan 40mg  daily w/ good control.  No CP, SOB above baseline, HAs, visual changes, edema.  Protein Calorie Malnutrition- pt reports he is not eating 'like I want to' but has 'a lot on my mind'.  Recently laid off.  Down 10 lbs since January.  Pt reports cutting 'way back on my drinking'- 'a bottle will last me a week now'.  Elevated PSA- pt has seen urology and is trying to get a biopsy done before insurance runs out 9/30.  Started on Alfuzosin.    Review of Systems For ROS see HPI   This visit occurred during the SARS-CoV-2 public health emergency.  Safety protocols were in place, including screening questions prior to the visit, additional usage of staff PPE, and extensive cleaning of exam room while observing appropriate contact time as indicated for disinfecting solutions.       Objective:   Physical Exam Vitals reviewed.  Constitutional:      General: He is not in acute distress.    Appearance: He is well-developed. He is not ill-appearing (frail, almost cachectic).  HENT:     Head: Normocephalic and atraumatic.  Eyes:     Conjunctiva/sclera: Conjunctivae normal.     Pupils: Pupils are equal, round, and reactive to light.  Neck:     Thyroid: No thyromegaly.  Cardiovascular:     Rate and Rhythm: Normal rate and regular rhythm.     Heart sounds: Normal heart sounds. No murmur heard.   Pulmonary:     Effort: Pulmonary effort is normal. No respiratory distress.     Breath sounds: Normal breath sounds.  Abdominal:     General: Bowel sounds are normal. There is no distension.     Palpations: Abdomen is soft.  Musculoskeletal:     Cervical back: Normal range of motion and neck supple.  Lymphadenopathy:     Cervical: No cervical adenopathy.  Skin:    General: Skin is warm and dry.  Neurological:       Mental Status: He is alert and oriented to person, place, and time.     Cranial Nerves: No cranial nerve deficit.  Psychiatric:        Behavior: Behavior normal.           Assessment & Plan:

## 2020-04-20 NOTE — Assessment & Plan Note (Signed)
Now following w/ urology.  Plan is for biopsy given elevated PSA and family hx of prostate cancer.  Recently started on Alfuzosin.  Will follow along.

## 2020-04-20 NOTE — Assessment & Plan Note (Signed)
Pt has lost 10 lbs since January.  He states he has cut back on his drinking- now only having 2-3 drinks/shots nightly.  Encouraged him to eat at least 3 meals/day and to include snacks or protein shakes.  Will continue to follow.

## 2020-04-20 NOTE — Assessment & Plan Note (Signed)
Chronic problem.  Well controlled today.  Asymptomatic.  Check labs.  No anticipated med changes.  Will follow. 

## 2020-04-20 NOTE — Patient Instructions (Addendum)
Schedule your complete physical in 6 months We'll notify you of your lab results and make any changes if needed Continue to eat at least 3 meals/day regularly and snack throughout the day Call with any questions or concerns Stay Safe!  Stay Healthy!

## 2020-04-21 LAB — HIV-1 RNA QUANT-NO REFLEX-BLD
HIV 1 RNA Quant: <20 Copies/mL
HIV-1 RNA Quant, Log: <1.3 Log cps/mL

## 2020-04-24 ENCOUNTER — Encounter: Payer: Self-pay | Admitting: General Practice

## 2020-05-01 ENCOUNTER — Telehealth: Payer: Self-pay | Admitting: Family Medicine

## 2020-05-01 NOTE — Telephone Encounter (Signed)
Pt needs nurse visit for EKG before I can sign surgical clearance form

## 2020-05-01 NOTE — Telephone Encounter (Signed)
Pt has been scheduled.  °

## 2020-05-01 NOTE — Telephone Encounter (Signed)
I have placed a Surgical Clearance from [facility] in the bin up front with a charge sheet.

## 2020-05-01 NOTE — Telephone Encounter (Signed)
Could we please schedule pt for a EKG on the nurse schedule.

## 2020-05-01 NOTE — Telephone Encounter (Signed)
Pt was just seen for elevated BP, has not had an EKG since 2017.

## 2020-05-02 ENCOUNTER — Ambulatory Visit (INDEPENDENT_AMBULATORY_CARE_PROVIDER_SITE_OTHER): Payer: BC Managed Care – PPO

## 2020-05-02 ENCOUNTER — Other Ambulatory Visit: Payer: Self-pay

## 2020-05-02 DIAGNOSIS — Z01818 Encounter for other preprocedural examination: Secondary | ICD-10-CM | POA: Diagnosis not present

## 2020-05-02 NOTE — Progress Notes (Signed)
Patient presents to the office for an EKG per PCP orders. EKG was completed and viewed by PCP for approval. Patient left the office in good condition. Fritz Pickerel, LPN

## 2020-05-03 ENCOUNTER — Encounter: Payer: Self-pay | Admitting: Family Medicine

## 2020-05-03 DIAGNOSIS — H6122 Impacted cerumen, left ear: Secondary | ICD-10-CM | POA: Diagnosis not present

## 2020-05-08 ENCOUNTER — Other Ambulatory Visit: Payer: Self-pay | Admitting: Internal Medicine

## 2020-05-08 ENCOUNTER — Other Ambulatory Visit (HOSPITAL_COMMUNITY): Payer: Self-pay | Admitting: Internal Medicine

## 2020-05-08 DIAGNOSIS — B20 Human immunodeficiency virus [HIV] disease: Secondary | ICD-10-CM

## 2020-05-10 MED FILL — BIKTARVY 50-200-25 MG TABS: 50-200-25 | 30 days supply | Qty: 30 | Fill #0

## 2020-05-10 MED FILL — PREZCOBIX 800 MG-150 MG TAB: 800-150 | 30 days supply | Qty: 30 | Fill #0

## 2020-05-18 ENCOUNTER — Other Ambulatory Visit: Payer: Self-pay

## 2020-05-18 ENCOUNTER — Encounter: Payer: Self-pay | Admitting: Internal Medicine

## 2020-05-18 ENCOUNTER — Ambulatory Visit: Payer: Self-pay

## 2020-06-06 ENCOUNTER — Other Ambulatory Visit: Payer: Self-pay | Admitting: Family Medicine

## 2020-06-06 NOTE — Telephone Encounter (Signed)
LFD 09/07/19 #45 with 6 refills LOV 04/20/20 NOV 10/18/20

## 2020-06-06 NOTE — Telephone Encounter (Signed)
He would also like a refill on the Plavix, pt uses [facility] on [address]

## 2020-06-07 MED FILL — BIKTARVY 50-200-25 MG TABS: 50-200-25 | 30 days supply | Qty: 30 | Fill #1

## 2020-06-07 MED FILL — PREZCOBIX 800 MG-150 MG TAB: 800-150 | 30 days supply | Qty: 30 | Fill #1

## 2020-06-08 ENCOUNTER — Other Ambulatory Visit: Payer: Self-pay | Admitting: Emergency Medicine

## 2020-06-08 MED ORDER — CLOPIDOGREL BISULFATE 75 MG PO TABS
75.0000 mg | ORAL_TABLET | Freq: Every day | ORAL | 1 refills | Status: DC
Start: 2020-06-08 — End: 2020-12-05

## 2020-06-18 ENCOUNTER — Encounter: Payer: Self-pay | Admitting: Radiation Oncology

## 2020-06-18 NOTE — Progress Notes (Signed)
GU Location of Tumor / Histology: prostatic adenocarcinoma  If Prostate Cancer, Gleason Score is (4 + 3) and PSA is (6.71). Prostate volume: 29.92 g  Daniel Gonzalez was first noted to have an elevated PSA in September 07, 2019 of 5.17. He was referred for further evaluation to Dr. Gloriann Loan. Repeat PSA drawn 04/11/20 psa 6.71. Patient's brother and father with a history of prostate cancer.  Biopsies of prostate (if applicable) revealed:    Past/Anticipated interventions by urology, if any: prostate biopsy, referral for consideration of brachytherapy  Past/Anticipated interventions by medical oncology, if any: no  Weight changes, if any: Yes, down 8 lb recently. Reports a poor appetite.  Bowel/Bladder complaints, if any: IPSS 6. SHIM 3. Denies dysuria or hematuria. Denies urinary leakage or incontinence. Reports urinary urgency and nocturia every 2 hours after drinking 3-4 beers. Reports his ED responds moderately to Viagra but he isn't sexually active at this time since he and his "lady broke it off." Reports having to sit to void due to bowel urgency.   Nausea/Vomiting, if any: no  Pain issues, if any:  denies  SAFETY ISSUES:  Prior radiation? denies  Pacemaker/ICD? denies  Possible current pregnancy? no, male patient  Is the patient on methotrexate? denies  Current Complaints / other details:  64 year old male. Divorced. Resides in [place]. Patient has one son and one daughter. Hx of a stroke. 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

## 2020-06-19 ENCOUNTER — Ambulatory Visit
Admission: RE | Admit: 2020-06-19 | Discharge: 2020-06-19 | Disposition: A | Discharge status: Home / Self Care | Payer: BLUE CROSS/BLUE SHIELD | Source: Ambulatory Visit | Attending: Radiation Oncology | Admitting: Radiation Oncology

## 2020-06-19 ENCOUNTER — Other Ambulatory Visit: Payer: Self-pay

## 2020-06-19 ENCOUNTER — Encounter: Payer: Self-pay | Admitting: Radiation Oncology

## 2020-06-19 VITALS — Ht 64.0 in | Wt 117.0 lb

## 2020-06-19 DIAGNOSIS — C61 Malignant neoplasm of prostate: Secondary | ICD-10-CM | POA: Insufficient documentation

## 2020-06-19 HISTORY — DX: Malignant neoplasm of prostate: C61

## 2020-06-19 NOTE — Progress Notes (Signed)
Radiation Oncology         [phone] ________________________________  Initial Outpatient Consultation - Conducted via Telephone due to current COVID-19 concerns for limiting patient exposure  Name: Daniel Gonzalez MRN: [id number]  Date: 06/19/2020  DOB: [birth year]  GN:FAOZHY, Aundra Millet, MD  Lucas Mallow, MD   REFERRING PHYSICIAN: Lucas Mallow, MD  DIAGNOSIS: 64 y.o. gentleman with Stage T1c adenocarcinoma of the prostate with Gleason score of 4+3, and PSA of 6.71.    ICD-10-CM   1. Malignant neoplasm of prostate ([place])  C61     HISTORY OF PRESENT ILLNESS: Daniel Gonzalez is a 64 y.o. male with a diagnosis of prostate cancer. He was noted to have an elevated PSA of 5.17 by his primary care physician, Dr. Birdie Riddle.  Accordingly, he was referred for evaluation in urology by Dr. Gloriann Loan in 10/2019,  digital rectal examination was performed at that time revealing no nodules.  Prostate biopsy was recommended at that time, but the patient was having issues with insurance coverage and therefore elected to postpone any procedures.  He returned for follow-up with Dr. Gloriann Loan on 04/11/2020 after obtaining help insurance.  A repeat PSA was performed at that time and was noted to be further elevated at 6.71.  The patient proceeded to transrectal ultrasound with 12 biopsies of the prostate on 05/08/2020.  The prostate volume measured 29.92 cc.  Out of 12 core biopsies, 1 was positive.  The maximum Gleason score was 4+3, and this was seen in the right mid lateral.  The patient reviewed the biopsy results with his urologist and he has kindly been referred today for discussion of potential radiation treatment options.   PREVIOUS RADIATION THERAPY: No  PAST MEDICAL HISTORY:  Past Medical History:  Diagnosis Date  . Blood in stool   . Cluster headache   . Frequent episodic tension-type headache   . HIV infection ([place])   . Hypertension   . Malignant neoplasm of prostate ([place]) 06/19/2020  .  Migraine   . Pneumonia   . Prostate cancer ([place])   . Stroke [facility])       PAST SURGICAL HISTORY: Past Surgical History:  Procedure Laterality Date  . cyst in back     removed from back  . PROSTATE BIOPSY    . TEE WITHOUT CARDIOVERSION N/A 06/24/2013   Procedure: TRANSESOPHAGEAL ECHOCARDIOGRAM (TEE);  Surgeon: Jolaine Artist, MD;  Location: [facility] ENDOSCOPY;  Service: Cardiovascular;  Laterality: N/A;    FAMILY HISTORY:  Family History  Problem Relation Age of Onset  . Arthritis Mother   . Heart disease Mother   . Hypertension Mother   . Diabetes Mother   . Arthritis Father   . Heart disease Father   . Hypertension Father   . Cancer Father   . Lung cancer Brother   . Prostate cancer Brother   . Prostate cancer Brother   . Colon cancer Neg Hx   . Pancreatic cancer Neg Hx   . Breast cancer Neg Hx     SOCIAL HISTORY:  Social History   Socioeconomic History  . Marital status: Divorced    Spouse name: Not on file  . Number of children: 3  . Years of education: HS  . Highest education level: Not on file  Occupational History  . Occupation: Firefighter: [employer]  Tobacco Use  . Smoking status: Current Every Day Smoker    Packs/day: 1.00    Years:  35.00    Pack years: 35.00    Types: Cigarettes  . Smokeless tobacco: Never Used  . Tobacco comment: slowing down  Vaping Use  . Vaping Use: Never used  Substance and Sexual Activity  . Alcohol use: Yes    Alcohol/week: 14.0 standard drinks    Types: 14 Standard drinks or equivalent per week    Comment: no fun drinking by yourself; liquor  . Drug use: Yes    Frequency: 7.0 times per week    Types: Marijuana    Comment: "few times a week"  . Sexual activity: Yes    Partners: Female    Birth control/protection: Condom    Comment: declined condoms 04/17/20  Other Topics Concern  . Not on file  Social History Narrative   Patient lives at home alone.   Caffeine Use: 1 cup occasionally   Social  Determinants of Health   Financial Resource Strain:   . Difficulty of Paying Living Expenses: Not on file  Food Insecurity:   . Worried About Charity fundraiser in the Last Year: Not on file  . Ran Out of Food in the Last Year: Not on file  Transportation Needs:   . Lack of Transportation (Medical): Not on file  . Lack of Transportation (Non-Medical): Not on file  Physical Activity:   . Days of Exercise per Week: Not on file  . Minutes of Exercise per Session: Not on file  Stress:   . Feeling of Stress : Not on file  Social Connections:   . Frequency of Communication with Friends and Family: Not on file  . Frequency of Social Gatherings with Friends and Family: Not on file  . Attends Religious Services: Not on file  . Active Member of Clubs or Organizations: Not on file  . Attends Archivist Meetings: Not on file  . Marital Status: Not on file  Intimate Partner Violence:   . Fear of Current or Ex-Partner: Not on file  . Emotionally Abused: Not on file  . Physically Abused: Not on file  . Sexually Abused: Not on file    ALLERGIES: Asa [aspirin] and Sulfamethoxazole-trimethoprim  MEDICATIONS:  Current Outpatient Medications  Medication Sig Dispense Refill  . acetaminophen (TYLENOL) 650 MG CR tablet Take 1,300 mg by mouth every 8 (eight) hours as needed for pain.    Marland Kitchen alfuzosin (UROXATRAL) 10 MG 24 hr tablet Take 10 mg by mouth daily.    Marland Kitchen amLODipine (NORVASC) 10 MG tablet TAKE 1 TABLET(10 MG) BY MOUTH DAILY 90 tablet 0  . BIKTARVY 50-200-25 MG TABS tablet TAKE 1 TABLET BY MOUTH DAILY. 30 tablet 5  . clopidogrel (PLAVIX) 75 MG tablet Take 1 tablet (75 mg total) by mouth daily. 90 tablet 1  . metoprolol succinate (TOPROL-XL) 50 MG 24 hr tablet TAKE 1 TABLET(50 MG) BY MOUTH DAILY 90 tablet 0  . Misc. Devices (PILL SPLITTER) MISC Please use to cut trazodone in 1/2. 1 each 0  . olmesartan (BENICAR) 40 MG tablet Take 1 tablet (40 mg total) by mouth daily. 90 tablet 0  .  Polyethyl Glycol-Propyl Glycol (SYSTANE) 0.4-0.3 % SOLN Apply 1 drop to eye daily as needed (for dry eyes).    Marland Kitchen [name] 800-150 MG tablet TAKE 1 TABLET BY MOUTH DAILY. SWALLOW WHOLE. DO NOT CRUSH, BREAK OR CHEW TABLETS. TAKE WITH FOOD. 30 tablet 5  . sildenafil (VIAGRA) 100 MG tablet Take 1 tablet (100 mg total) by mouth daily as needed for erectile dysfunction. [place]  tablet 0  . traZODone (DESYREL) 50 MG tablet TAKE 1 AND 1/2 TABLET BY MOUTH AT BEDTIME 45 tablet 6   No current facility-administered medications for this encounter.    REVIEW OF SYSTEMS:  On review of systems, the patient reports that he is doing well overall. He denies any chest pain, shortness of breath, cough, fevers, chills, night sweats, unintended weight changes. He denies any bowel disturbances, and denies abdominal pain, nausea or vomiting. He denies any new musculoskeletal or joint aches or pains. His IPSS was 6, indicating mild urinary symptoms. He reports increased urinary urgency and nocturia with drinking beer. His SHIM was 3, indicating he has severe erectile dysfunction. He reports a moderate response to Viagra. A complete review of systems is obtained and is otherwise negative.  PHYSICAL EXAM:  Wt Readings from Last 3 Encounters:  06/19/20 117 lb (53.1 kg)  04/20/20 114 lb (51.7 kg)  04/17/20 113 lb (51.3 kg)   Temp Readings from Last 3 Encounters:  04/20/20 97.7 F (36.5 C) (Skin)  04/17/20 98.3 F (36.8 C)  10/20/19 98.3 F (36.8 C) (Oral)   BP Readings from Last 3 Encounters:  04/20/20 110/76  04/17/20 114/82  10/20/19 133/83   Pulse Readings from Last 3 Encounters:  04/20/20 72  04/17/20 66  10/20/19 61   Pain Assessment Pain Score: 0-No pain/10  Physical exam not performed in light of telephone consult visit format.   KPS = 90  100 - Normal; no complaints; no evidence of disease. 90   - Able to carry on normal activity; minor signs or symptoms of disease. 80   - Normal activity with  effort; some signs or symptoms of disease. 7   - Cares for self; unable to carry on normal activity or to do active work. 60   - Requires occasional assistance, but is able to care for most of his personal needs. 50   - Requires considerable assistance and frequent medical care. 80   - Disabled; requires special care and assistance. 90   - Severely disabled; hospital admission is indicated although death not imminent. 3   - Very sick; hospital admission necessary; active supportive treatment necessary. 10   - Moribund; fatal processes progressing rapidly. 0     - Dead  Karnofsky DA, Abelmann Hanna, Craver LS and Burchenal [facility] [phone]) The use of the nitrogen mustards in the palliative treatment of carcinoma: with particular reference to bronchogenic carcinoma Cancer 1 634-56  LABORATORY DATA:  Lab Results  Component Value Date   WBC 6.5 04/20/2020   HGB 12.9 (L) 04/20/2020   HCT 38.0 (L) 04/20/2020   MCV 105.3 (H) 04/20/2020   PLT 186.0 04/20/2020   Lab Results  Component Value Date   NA 136 04/20/2020   K 3.9 04/20/2020   CL 103 04/20/2020   CO2 23 04/20/2020   Lab Results  Component Value Date   ALT 7 04/20/2020   AST 16 04/20/2020   ALKPHOS 55 04/20/2020   BILITOT 0.4 04/20/2020     RADIOGRAPHY: No results found.    IMPRESSION/PLAN: This visit was conducted via Telephone to spare the patient unnecessary potential exposure in the healthcare setting during the current COVID-19 pandemic. 1. 64 y.o. gentleman with Stage T1c adenocarcinoma of the prostate with Gleason Score of 4+3, and PSA of 6.71. We discussed the patient's workup and outlined the nature of prostate cancer in this setting. The patient's T stage, Gleason's score, and PSA put him into the intermediate risk group.  Accordingly, he is eligible for a variety of potential treatment options including prostatectomy, brachytherapy, or 5.5 weeks of external radiation. We discussed the available radiation techniques, and  focused on the details and logistics of delivery. We discussed and outlined the risks, benefits, short and long-term effects associated with radiotherapy and compared and contrasted these with prostatectomy. We discussed the role of SpaceOAR in reducing the rectal toxicity associated with radiotherapy. He appears to have a good understanding of his disease and our treatment recommendations which are of curative intent.  He was encouraged to ask questions that were answered to his stated satisfaction.  At the end of the conversation, the patient is interested in moving forward with brachytherapy and use of SpaceOAR gel to reduce rectal toxicity from radiotherapy.  We will share our discussion with Dr. Gloriann Loan and move forward with scheduling his [facility] planning appointment in the near future. The patient expressed interest in delaying the procedure until January 2022, so he can enjoy being around his family and grandchildren during the holidays. He will be contacted by Romie Jumper in our office who will be working closely with him to coordinate OR scheduling and pre and post procedure appointments.  We will contact the pharmaceutical rep to ensure that [facility] is available at the time of procedure.  We enjoyed meeting him today and look forward to continuing to participate in his care.  Given current concerns for patient exposure during the COVID-19 pandemic, this encounter was conducted via telephone. The patient was notified in advance and was offered a MyChart meeting to allow for face to face communication but unfortunately reported that he did not have the appropriate resources/technology to support such a visit and instead preferred to proceed with telephone consult. The patient has given verbal consent for this type of encounter. The time spent during this encounter was 45 minutes. The attendants for this meeting include Tyler Pita MD, Ashlyn Bruning PA-C, [place], and patient,  Daniel Gonzalez. During the encounter, Tyler Pita MD, Ashlyn Bruning PA-C, and scribe, Wilburn Mylar were located at [place].  Patient, Daniel Gonzalez was located at home.    Nicholos Johns, PA-C    Tyler Pita, MD  [facility] Direct Dial: [phone]  Fax: [phone] [email or web address]  Skype  LinkedIn  This document serves as a record of services personally performed by Tyler Pita, MD and Freeman Caldron, PA-C. It was created on their behalf by Wilburn Mylar, a trained medical scribe. The creation of this record is based on the scribe's personal observations and the provider's statements to them. This document has been checked and approved by the attending provider.

## 2020-06-20 ENCOUNTER — Telehealth: Payer: Self-pay | Admitting: *Deleted

## 2020-06-20 NOTE — Telephone Encounter (Signed)
CALLED PATIENT TO ASK QUESTIONS, SPOKE WITH PATIENT 

## 2020-07-03 ENCOUNTER — Other Ambulatory Visit: Payer: Self-pay | Admitting: Urology

## 2020-07-03 DIAGNOSIS — C61 Malignant neoplasm of prostate: Secondary | ICD-10-CM

## 2020-07-05 ENCOUNTER — Other Ambulatory Visit: Payer: Self-pay

## 2020-07-05 DIAGNOSIS — I10 Essential (primary) hypertension: Secondary | ICD-10-CM

## 2020-07-05 MED ORDER — METOPROLOL SUCCINATE ER 50 MG PO TB24: ORAL_TABLET | ORAL | 0 refills | Status: DC

## 2020-07-05 MED ORDER — OLMESARTAN MEDOXOMIL 40 MG PO TABS
40.0000 mg | ORAL_TABLET | Freq: Every day | ORAL | 0 refills | Status: DC
Start: 2020-07-05 — End: 2020-10-05

## 2020-07-05 MED ORDER — AMLODIPINE BESYLATE 10 MG PO TABS: ORAL_TABLET | ORAL | 0 refills | Status: DC

## 2020-07-05 MED FILL — PREZCOBIX 800 MG-150 MG TAB: 800-150 | 30 days supply | Qty: 30 | Fill #2

## 2020-07-05 MED FILL — BIKTARVY 50-200-25 MG TABS: 50-200-25 | 30 days supply | Qty: 30 | Fill #2

## 2020-07-16 ENCOUNTER — Encounter: Payer: Self-pay | Admitting: Medical Oncology

## 2020-07-16 NOTE — Progress Notes (Signed)
Spoke with patient to introduce myself as the prostate nurse navigator and discuss my role. I was unable to meet him when he consulted with Dr. Tammi Klippel. He has chosen brachytherapy to treat his prostate cancer. He is scheduled for surgery 09/10/20. He is scheduled for CT simulation and pre-op 08/02/20. I discussed what will take place during theses appointments and gave him my contact information and asked him to call me with questions or concerns. He voiced understanding.

## 2020-07-19 ENCOUNTER — Encounter: Payer: Self-pay | Admitting: Medical Oncology

## 2020-07-19 NOTE — Progress Notes (Signed)
Patient called stating his insurance did not cover his visit at [facility]. He is concerned about 12/16 appointments and coverage. I will follow up with our financial counselors and call him back with an update. If no coverage, he would like to reschedule. He will have a new plan after January 1. Message sent to [facility] in financial counseling.

## 2020-07-19 NOTE — Progress Notes (Signed)
Spoke with patient to inform him per [place] advisor for radiation, his current insurance plan is out of network with [employer]. He is getting a new plan that will cover [facility] and [facility] in January. He would like to postpone his CT simulation and pre-op until after January 1. I notified Enid Derry in radiation oncology.

## 2020-08-02 ENCOUNTER — Ambulatory Visit: Payer: BLUE CROSS/BLUE SHIELD | Admitting: Radiation Oncology

## 2020-08-02 ENCOUNTER — Encounter (HOSPITAL_COMMUNITY): Admission: RE | Admit: 2020-08-02 | Payer: BLUE CROSS/BLUE SHIELD | Source: Ambulatory Visit

## 2020-08-02 ENCOUNTER — Ambulatory Visit: Payer: Self-pay | Admitting: Urology

## 2020-08-02 MED FILL — PREZCOBIX 800 MG-150 MG TAB: 800-150 | 30 days supply | Qty: 30 | Fill #3

## 2020-08-02 MED FILL — BIKTARVY 50-200-25 MG TABS: 50-200-25 | 30 days supply | Qty: 30 | Fill #3

## 2020-08-22 ENCOUNTER — Telehealth: Payer: Self-pay | Admitting: *Deleted

## 2020-08-22 NOTE — Telephone Encounter (Signed)
CALLED PATIENT TO REMIND OF PRE-SEED APPTS. FOR 08-23-20, SPOKE WITH PATIENT AND HE IS AWARE OF THESE APPTS. 

## 2020-08-22 NOTE — Progress Notes (Signed)
  Radiation Oncology         [phone] ________________________________  Name: Daniel Gonzalez MRN: [id number]  Date: 08/23/2020  DOB: [birth year]  SIMULATION AND TREATMENT PLANNING NOTE PUBIC ARCH STUDY  TR:[id number], Helane Rima, MD  Crista Elliot, MD  DIAGNOSIS: 65 y.o. gentleman with Stage T1c adenocarcinoma of the prostate with Gleason score of 4+3, and PSA of 6.71.  Oncology History  Malignant neoplasm of prostate ([facility])  05/08/2020 Cancer Staging   Staging form: Prostate, AJCC 8th Edition - Clinical stage from 05/08/2020: Stage IIC (cT1c, cN0, cM0, PSA: 6.7, Grade Group: 3) - Signed by Marcello Fennel, PA-C on 06/19/2020   06/19/2020 Initial Diagnosis   Malignant neoplasm of prostate ([facility])       ICD-10-CM   1. Malignant neoplasm of prostate ([facility])  C61     COMPLEX SIMULATION:  The patient presented today for evaluation for possible prostate seed implant. He was brought to the radiation planning suite and placed supine on the CT couch. A 3-dimensional image study set was obtained in upload to the planning computer. There, on each axial slice, I contoured the prostate gland. Then, using three-dimensional radiation planning tools I reconstructed the prostate in view of the structures from the transperineal needle pathway to assess for possible pubic arch interference. In doing so, I did not appreciate any pubic arch interference. Also, the patient's prostate volume was estimated based on the drawn structure. The volume was 30 cc.  Given the pubic arch appearance and prostate volume, patient remains a good candidate to proceed with prostate seed implant. Today, he freely provided informed written consent to proceed.    PLAN: The patient will undergo prostate seed implant.   ________________________________  Artist Pais. Kathrynn Running, M.D.

## 2020-08-23 ENCOUNTER — Ambulatory Visit
Admission: RE | Admit: 2020-08-23 | Discharge: 2020-08-23 | Disposition: A | Discharge status: Home / Self Care | Payer: 59 | Source: Ambulatory Visit | Attending: Radiation Oncology | Admitting: Radiation Oncology

## 2020-08-23 ENCOUNTER — Encounter: Payer: Self-pay | Admitting: Medical Oncology

## 2020-08-23 ENCOUNTER — Encounter (HOSPITAL_COMMUNITY)
Admission: RE | Admit: 2020-08-23 | Discharge: 2020-08-23 | Disposition: A | Discharge status: Home / Self Care | Payer: 59 | Source: Ambulatory Visit | Attending: Urology | Admitting: Urology

## 2020-08-23 ENCOUNTER — Other Ambulatory Visit: Payer: Self-pay

## 2020-08-23 ENCOUNTER — Ambulatory Visit
Admission: RE | Admit: 2020-08-23 | Discharge: 2020-08-23 | Disposition: A | Discharge status: Home / Self Care | Payer: 59 | Source: Ambulatory Visit | Attending: Urology | Admitting: Urology

## 2020-08-23 ENCOUNTER — Ambulatory Visit (HOSPITAL_COMMUNITY)
Admission: RE | Admit: 2020-08-23 | Discharge: 2020-08-23 | Disposition: A | Discharge status: Home / Self Care | Payer: 59 | Source: Ambulatory Visit | Attending: Urology | Admitting: Urology

## 2020-08-23 DIAGNOSIS — C61 Malignant neoplasm of prostate: Secondary | ICD-10-CM | POA: Diagnosis not present

## 2020-08-23 DIAGNOSIS — Z01818 Encounter for other preprocedural examination: Secondary | ICD-10-CM | POA: Diagnosis present

## 2020-08-30 MED FILL — BIKTARVY 50-200-25 MG TABS: 50-200-25 | 30 days supply | Qty: 30 | Fill #4

## 2020-08-30 MED FILL — PREZCOBIX 800 MG-150 MG TAB: 800-150 | 30 days supply | Qty: 30 | Fill #4

## 2020-09-03 ENCOUNTER — Telehealth: Payer: Self-pay | Admitting: *Deleted

## 2020-09-03 NOTE — Telephone Encounter (Signed)
CALLED PATIENT TO REMIND OF LAB AND COVID TESTING FOR 09-06-20, SPOKE WITH PATIENT AND HE IS AWARE OF THESE APPTS.

## 2020-09-06 ENCOUNTER — Other Ambulatory Visit (HOSPITAL_COMMUNITY): Payer: 59

## 2020-09-06 ENCOUNTER — Other Ambulatory Visit: Payer: Self-pay

## 2020-09-06 ENCOUNTER — Encounter (HOSPITAL_BASED_OUTPATIENT_CLINIC_OR_DEPARTMENT_OTHER): Payer: Self-pay | Admitting: Urology

## 2020-09-06 ENCOUNTER — Encounter (HOSPITAL_COMMUNITY)
Admission: RE | Admit: 2020-09-06 | Discharge: 2020-09-06 | Disposition: A | Discharge status: Home / Self Care | Payer: 59 | Source: Ambulatory Visit | Attending: Urology | Admitting: Urology

## 2020-09-06 ENCOUNTER — Encounter (HOSPITAL_COMMUNITY): Payer: 59

## 2020-09-06 ENCOUNTER — Other Ambulatory Visit (HOSPITAL_COMMUNITY)
Admission: RE | Admit: 2020-09-06 | Discharge: 2020-09-06 | Disposition: A | Discharge status: Home / Self Care | Payer: 59 | Source: Ambulatory Visit | Attending: Urology | Admitting: Urology

## 2020-09-06 DIAGNOSIS — Z01812 Encounter for preprocedural laboratory examination: Secondary | ICD-10-CM | POA: Diagnosis present

## 2020-09-06 DIAGNOSIS — Z20822 Contact with and (suspected) exposure to covid-19: Secondary | ICD-10-CM | POA: Diagnosis not present

## 2020-09-06 LAB — COMPREHENSIVE METABOLIC PANEL
ALT: 16 U/L (ref 0–44)
AST: 27 U/L (ref 15–41)
Albumin: 4.5 g/dL (ref 3.5–5.0)
Alkaline Phosphatase: 64 U/L (ref 38–126)
Anion gap: 12 (ref 5–15)
BUN: 13 mg/dL (ref 8–23)
CO2: 19 mmol/L — ABNORMAL LOW (ref 22–32)
Calcium: 9.5 mg/dL (ref 8.9–10.3)
Chloride: 105 mmol/L (ref 98–111)
Creatinine, Ser: 0.88 mg/dL (ref 0.61–1.24)
GFR, Estimated: >60 mL/min (ref >60)
Glucose, Bld: 96 mg/dL (ref 70–99)
Potassium: 4 mmol/L (ref 3.5–5.1)
Sodium: 136 mmol/L (ref 135–145)
Total Bilirubin: 0.4 mg/dL (ref 0.3–1.2)
Total Protein: 8 g/dL (ref 6.5–8.1)

## 2020-09-06 LAB — CBC
HCT: 37.8 % — ABNORMAL LOW (ref 39.0–52.0)
Hemoglobin: 13.3 g/dL (ref 13.0–17.0)
MCH: 35.4 pg — ABNORMAL HIGH (ref 26.0–34.0)
MCHC: 35.2 g/dL (ref 30.0–36.0)
MCV: 100.5 fL — ABNORMAL HIGH (ref 80.0–100.0)
Platelets: 236 10*3/uL (ref 150–400)
RBC: 3.76 MIL/uL — ABNORMAL LOW (ref 4.22–5.81)
RDW: 13.1 % (ref 11.5–15.5)
WBC: 3.7 10*3/uL — ABNORMAL LOW (ref 4.0–10.5)
nRBC: 0 % (ref 0.0–0.2)

## 2020-09-06 LAB — PROTIME-INR
INR: 1 (ref 0.8–1.2)
Prothrombin Time: 12.6 seconds (ref 11.4–15.2)

## 2020-09-06 LAB — APTT: aPTT: 28 seconds (ref 24–36)

## 2020-09-06 LAB — SARS CORONAVIRUS 2 (TAT 6-24 HRS): SARS Coronavirus 2: NEGATIVE

## 2020-09-07 ENCOUNTER — Telehealth: Payer: Self-pay | Admitting: Family Medicine

## 2020-09-07 ENCOUNTER — Other Ambulatory Visit: Payer: Self-pay

## 2020-09-07 ENCOUNTER — Encounter (HOSPITAL_BASED_OUTPATIENT_CLINIC_OR_DEPARTMENT_OTHER): Payer: Self-pay | Admitting: Urology

## 2020-09-07 ENCOUNTER — Telehealth: Payer: Self-pay | Admitting: *Deleted

## 2020-09-07 ENCOUNTER — Telehealth: Payer: Self-pay

## 2020-09-07 NOTE — Telephone Encounter (Signed)
Called and spoke with patient and per Dr. Birdie Riddle pt is to stop taking the plavix today and resume after procedure. Pt understood. No further concerns at this time.

## 2020-09-07 NOTE — Telephone Encounter (Signed)
I don't know what type of surgery he is having, but he should stop the Plavix today and then resume after his procedure

## 2020-09-07 NOTE — Telephone Encounter (Signed)
Called patient to remind of procedure for 09-10-20, spoke with patient and he is aware of this procedure

## 2020-09-07 NOTE — Telephone Encounter (Signed)
Pt called in stating that he is having surgery on Monday, He needs to know when he should stop taking the Plavix, he would like an answer asap.   Please advise

## 2020-09-07 NOTE — Progress Notes (Signed)
Spoke w/ via phone for pre-op interview---pt Lab needs dos----   none            Lab results------cbc, cmet pt, ptt 09-06-2020 epic COVID test ------09-06-2020 1050 Arrive at -------11000 am NPO after MN NO Solid Food.  Clear liquids from MN until---1000 am then npo Medications to take morning of surgery ----- Diabetic medication -----biktarvy, prezobix, metorpolol, amlodipine, alfuzosin, eye drops Patient Special Instructions -----fleets enema am of surgery Pre-Op special Istructions -----none Patient verbalized understanding of instructions that were given at this phone interview. Patient denies shortness of breath, chest pain, fever, cough at this phone interview.  Spoke with Darrel Reach at [facility] urology, pt was mailed letter to stop plavix 5 days before surgery, pt called prescriber for plavix dr Birdie Riddle and left message is it ok to stop plavix for 09-11-2019 surgery? Last dose of plavix was 09-06-2020 800 am per patient

## 2020-09-09 NOTE — Anesthesia Preprocedure Evaluation (Addendum)
Anesthesia Evaluation    Reviewed: Allergy & Precautions, Patient's Chart, lab work & pertinent test results  Airway Mallampati: II  TM Distance: >3 FB Neck ROM: Full    Dental no notable dental hx. (+) Partial Lower, Upper Dentures, Dental Advisory Given,    Pulmonary Current Smoker and Patient abstained from smoking.,    Pulmonary exam normal breath sounds clear to auscultation       Cardiovascular hypertension, Pt. on medications and Pt. on home beta blockers Normal cardiovascular exam Rhythm:Regular Rate:Normal     Neuro/Psych  Headaches, PSYCHIATRIC DISORDERS Depression  Neuromuscular disease CVA, No Residual Symptoms    GI/Hepatic Neg liver ROS, GERD  Controlled and Medicated,  Endo/Other  negative endocrine ROS  Renal/GU K+ 4.0 Cr 0.88   Prostate CA S/P rads    Musculoskeletal negative musculoskeletal ROS (+)   Abdominal   Peds  Hematology  (+) HIV, Hgb 13.3   Anesthesia Other Findings All: ASA, Sulfa,  Reproductive/Obstetrics                            Anesthesia Physical Anesthesia Plan  ASA: III  Anesthesia Plan: General   Post-op Pain Management:    Induction: Intravenous  PONV Risk Score and Plan: 3 and Treatment may vary due to age or medical condition, Ondansetron, Midazolam and Amisulpride  Airway Management Planned: LMA  Additional Equipment: None  Intra-op Plan:   Post-operative Plan:   Informed Consent: I have reviewed the patients History and Physical, chart, labs and discussed the procedure including the risks, benefits and alternatives for the proposed anesthesia with the patient or authorized representative who has indicated his/her understanding and acceptance.     Dental advisory given  Plan Discussed with: CRNA  Anesthesia Plan Comments: (LMA GA)       Anesthesia Quick Evaluation

## 2020-09-09 NOTE — Progress Notes (Signed)
  Radiation Oncology         [phone] ________________________________  Name: Daniel Gonzalez MRN: [id number]  Date: 09/09/2020  DOB: 1955/11/28       Prostate Seed Implant  DJ:SHFWYO, Aundra Millet, MD  No ref. provider found  DIAGNOSIS: 65 y.o. gentleman with Stage T1c adenocarcinoma of the prostate with Gleason score of 4+3, and PSA of 6.71.  Oncology History  Malignant neoplasm of prostate ([place])  05/08/2020 Cancer Staging   Staging form: Prostate, AJCC 8th Edition - Clinical stage from 05/08/2020: Stage IIC (cT1c, cN0, cM0, PSA: 6.7, Grade Group: 3) - Signed by Freeman Caldron, PA-C on 06/19/2020   06/19/2020 Initial Diagnosis   Malignant neoplasm of prostate ([place])    PROCEDURE: Insertion of radioactive I-125 seeds into the prostate gland.  RADIATION DOSE: 145 Gy, definitive therapy.  TECHNIQUE: [name] was brought to the operating room with the urologist. He was placed in the dorsolithotomy position. He was catheterized and a rectal tube was inserted. The perineum was shaved, prepped and draped. The ultrasound probe was then introduced into the rectum to see the prostate gland.  TREATMENT DEVICE: A needle grid was attached to the ultrasound probe stand and anchor needles were placed.  3D PLANNING: The prostate was imaged in 3D using a sagittal sweep of the prostate probe. These images were transferred to the planning computer. There, the prostate, urethra and rectum were defined on each axial reconstructed image. Then, the software created an optimized 3D plan and a few seed positions were adjusted. The quality of the plan was reviewed using [facility] information for the target and the following two organs at risk:  Urethra and Rectum.  Then the accepted plan was printed and handed off to the radiation therapist.  Under my supervision, the custom loading of the seeds and spacers was carried out and loaded into sealed vicryl sleeves.  These pre-loaded needles were then placed into  the needle holder.Marland Kitchen  PROSTATE VOLUME STUDY:  Using transrectal ultrasound the volume of the prostate was verified to be 35.8 cc.  SPECIAL TREATMENT PROCEDURE/SUPERVISION AND HANDLING: The pre-loaded needles were then delivered under sagittal guidance. A total of 19 needles were used to deposit 68 seeds in the prostate gland. The individual seed activity was 0.396 mCi.  SpaceOAR:  Yes  COMPLEX SIMULATION: At the end of the procedure, an anterior radiograph of the pelvis was obtained to document seed positioning and count. Cystoscopy was performed to check the urethra and bladder.  MICRODOSIMETRY: At the end of the procedure, the patient was emitting 0.138 mR/hr at 1 meter. Accordingly, he was considered safe for hospital discharge.  PLAN: The patient will return to the radiation oncology clinic for post implant CT dosimetry in three weeks.   ________________________________  Sheral Apley Tammi Klippel, M.D.

## 2020-09-10 ENCOUNTER — Ambulatory Visit (HOSPITAL_COMMUNITY): Payer: 59

## 2020-09-10 ENCOUNTER — Other Ambulatory Visit: Payer: Self-pay

## 2020-09-10 ENCOUNTER — Encounter (HOSPITAL_BASED_OUTPATIENT_CLINIC_OR_DEPARTMENT_OTHER)
Admission: RE | Disposition: A | Discharge status: Home / Self Care | Payer: Self-pay | Source: Other Acute Inpatient Hospital | Attending: Urology

## 2020-09-10 ENCOUNTER — Ambulatory Visit (HOSPITAL_BASED_OUTPATIENT_CLINIC_OR_DEPARTMENT_OTHER): Payer: 59 | Admitting: Anesthesiology

## 2020-09-10 ENCOUNTER — Ambulatory Visit (HOSPITAL_BASED_OUTPATIENT_CLINIC_OR_DEPARTMENT_OTHER)
Admission: RE | Admit: 2020-09-10 | Discharge: 2020-09-10 | Disposition: A | Discharge status: Home / Self Care | Payer: 59 | Source: Other Acute Inpatient Hospital | Attending: Urology | Admitting: Urology

## 2020-09-10 ENCOUNTER — Encounter (HOSPITAL_BASED_OUTPATIENT_CLINIC_OR_DEPARTMENT_OTHER): Payer: Self-pay | Admitting: Urology

## 2020-09-10 DIAGNOSIS — C61 Malignant neoplasm of prostate: Secondary | ICD-10-CM | POA: Insufficient documentation

## 2020-09-10 DIAGNOSIS — Z882 Allergy status to sulfonamides status: Secondary | ICD-10-CM | POA: Diagnosis not present

## 2020-09-10 DIAGNOSIS — Z7902 Long term (current) use of antithrombotics/antiplatelets: Secondary | ICD-10-CM | POA: Diagnosis not present

## 2020-09-10 DIAGNOSIS — Z8673 Personal history of transient ischemic attack (TIA), and cerebral infarction without residual deficits: Secondary | ICD-10-CM | POA: Diagnosis not present

## 2020-09-10 DIAGNOSIS — Z8249 Family history of ischemic heart disease and other diseases of the circulatory system: Secondary | ICD-10-CM | POA: Diagnosis not present

## 2020-09-10 DIAGNOSIS — Z886 Allergy status to analgesic agent status: Secondary | ICD-10-CM | POA: Insufficient documentation

## 2020-09-10 DIAGNOSIS — Z79899 Other long term (current) drug therapy: Secondary | ICD-10-CM | POA: Insufficient documentation

## 2020-09-10 DIAGNOSIS — Z8042 Family history of malignant neoplasm of prostate: Secondary | ICD-10-CM | POA: Insufficient documentation

## 2020-09-10 DIAGNOSIS — F17200 Nicotine dependence, unspecified, uncomplicated: Secondary | ICD-10-CM | POA: Insufficient documentation

## 2020-09-10 HISTORY — DX: Presence of spectacles and contact lenses: Z97.3

## 2020-09-10 HISTORY — DX: Presence of dental prosthetic device (complete) (partial): Z97.2

## 2020-09-10 HISTORY — PX: SPACE OAR INSTILLATION: SHX6769

## 2020-09-10 HISTORY — PX: RADIOACTIVE SEED IMPLANT: SHX5150

## 2020-09-10 HISTORY — DX: Gastro-esophageal reflux disease without esophagitis: K21.9

## 2020-09-10 SURGERY — INSERTION, RADIATION SOURCE, PROSTATE
Anesthesia: General | Site: Rectum

## 2020-09-10 MED ORDER — FLEET ENEMA 7-19 GM/118ML RE ENEM: 1.0000 | ENEMA | Freq: Once | RECTAL | Status: DC

## 2020-09-10 MED ORDER — HYDROCODONE-ACETAMINOPHEN 5-325 MG PO TABS: 1.0000 | ORAL_TABLET | ORAL | 0 refills | Status: DC | PRN

## 2020-09-10 MED ORDER — CIPROFLOXACIN IN D5W 400 MG/200ML IV SOLN
400.0000 mg | INTRAVENOUS | Status: AC
  Administered 2020-09-10: 400 mg via INTRAVENOUS

## 2020-09-10 MED ORDER — CIPROFLOXACIN IN D5W 400 MG/200ML IV SOLN
INTRAVENOUS | Status: AC
  Filled 2020-09-10: qty 200

## 2020-09-10 MED ORDER — DEXAMETHASONE SODIUM PHOSPHATE 10 MG/ML IJ SOLN
INTRAMUSCULAR | Status: AC
  Filled 2020-09-10: qty 1

## 2020-09-10 MED ORDER — FENTANYL CITRATE (PF) 100 MCG/2ML IJ SOLN
INTRAMUSCULAR | Status: DC | PRN
  Administered 2020-09-10 (×2): 25 ug via INTRAVENOUS
  Administered 2020-09-10: 100 ug via INTRAVENOUS
  Administered 2020-09-10: 50 ug via INTRAVENOUS

## 2020-09-10 MED ORDER — MIDAZOLAM HCL 2 MG/2ML IJ SOLN
INTRAMUSCULAR | Status: AC
  Filled 2020-09-10: qty 2

## 2020-09-10 MED ORDER — ONDANSETRON HCL 4 MG/2ML IJ SOLN
INTRAMUSCULAR | Status: DC | PRN
  Administered 2020-09-10: 4 mg via INTRAVENOUS

## 2020-09-10 MED ORDER — SODIUM CHLORIDE 0.9 % IV SOLN
INTRAVENOUS | Status: AC | PRN
  Administered 2020-09-10: 6 mL via INTRAMUSCULAR

## 2020-09-10 MED ORDER — FENTANYL CITRATE (PF) 100 MCG/2ML IJ SOLN
INTRAMUSCULAR | Status: AC
  Filled 2020-09-10: qty 2

## 2020-09-10 MED ORDER — ACETAMINOPHEN 10 MG/ML IV SOLN: 1000.0000 mg | Freq: Once | INTRAVENOUS | Status: DC | PRN

## 2020-09-10 MED ORDER — PROPOFOL 10 MG/ML IV BOLUS
INTRAVENOUS | Status: DC | PRN
  Administered 2020-09-10: 150 mg via INTRAVENOUS
  Administered 2020-09-10: 30 mg via INTRAVENOUS
  Administered 2020-09-10: 20 mg via INTRAVENOUS

## 2020-09-10 MED ORDER — LIDOCAINE HCL (CARDIAC) PF 100 MG/5ML IV SOSY
PREFILLED_SYRINGE | INTRAVENOUS | Status: DC | PRN
  Administered 2020-09-10: 100 mg via INTRAVENOUS

## 2020-09-10 MED ORDER — ONDANSETRON HCL 4 MG/2ML IJ SOLN: 4.0000 mg | Freq: Once | INTRAMUSCULAR | Status: DC | PRN

## 2020-09-10 MED ORDER — FENTANYL CITRATE (PF) 100 MCG/2ML IJ SOLN: 25.0000 ug | INTRAMUSCULAR | Status: DC | PRN

## 2020-09-10 MED ORDER — IOHEXOL 300 MG/ML  SOLN
INTRAMUSCULAR | Status: DC | PRN
  Administered 2020-09-10: 7 mL via URETHRAL

## 2020-09-10 MED ORDER — MIDAZOLAM HCL 5 MG/5ML IJ SOLN
INTRAMUSCULAR | Status: DC | PRN
  Administered 2020-09-10: 2 mg via INTRAVENOUS

## 2020-09-10 MED ORDER — SODIUM CHLORIDE 0.9 % IR SOLN
Status: DC | PRN
  Administered 2020-09-10: 200 mL via INTRAVESICAL

## 2020-09-10 MED ORDER — LACTATED RINGERS IV SOLN
INTRAVENOUS | Status: DC
  Administered 2020-09-10: 1000 mL via INTRAVENOUS

## 2020-09-10 MED ORDER — DEXAMETHASONE SODIUM PHOSPHATE 4 MG/ML IJ SOLN
INTRAMUSCULAR | Status: DC | PRN
  Administered 2020-09-10: 10 mg via INTRAVENOUS

## 2020-09-10 MED ORDER — LIDOCAINE HCL (PF) 2 % IJ SOLN
INTRAMUSCULAR | Status: AC
  Filled 2020-09-10: qty 5

## 2020-09-10 MED ORDER — STERILE WATER FOR IRRIGATION IR SOLN
Status: DC | PRN
  Administered 2020-09-10: 3 mL

## 2020-09-10 SURGICAL SUPPLY — 39 items
BAG DRN RND TRDRP ANRFLXCHMBR (UROLOGICAL SUPPLIES) ×2
BAG URINE DRAIN 2000ML AR STRL (UROLOGICAL SUPPLIES) ×3 IMPLANT
BLADE CLIPPER SENSICLIP SURGIC (BLADE) ×3 IMPLANT
CATH FOLEY 2WAY SLVR  5CC 16FR (CATHETERS) ×3
CATH FOLEY 2WAY SLVR 5CC 16FR (CATHETERS) ×2 IMPLANT
CATH ROBINSON RED A/P 16FR (CATHETERS) IMPLANT
CATH ROBINSON RED A/P 20FR (CATHETERS) ×3 IMPLANT
CLOTH BEACON ORANGE TIMEOUT ST (SAFETY) ×3 IMPLANT
CNTNR URN SCR LID CUP LEK RST (MISCELLANEOUS) ×4 IMPLANT
CONT SPEC 4OZ STRL OR WHT (MISCELLANEOUS) ×6
COVER BACK TABLE 60X90IN (DRAPES) ×3 IMPLANT
COVER MAYO STAND STRL (DRAPES) ×3 IMPLANT
DRAPE C-ARM 35X43 STRL (DRAPES) ×2 IMPLANT
DRSG TEGADERM 4X4.75 (GAUZE/BANDAGES/DRESSINGS) ×5 IMPLANT
DRSG TEGADERM 8X12 (GAUZE/BANDAGES/DRESSINGS) ×5 IMPLANT
GAUZE SPONGE 4X4 12PLY STRL LF (GAUZE/BANDAGES/DRESSINGS) ×1 IMPLANT
GLOVE BIO SURGEON STRL SZ7.5 (GLOVE) ×3 IMPLANT
GLOVE BIO SURGEON STRL SZ8 (GLOVE) ×1 IMPLANT
GLOVE SURG ENC MOIS LTX SZ6.5 (GLOVE) ×2 IMPLANT
GLOVE SURG ORTHO 8.5 STRL (GLOVE) ×4 IMPLANT
GLOVE SURG SS PI 6.5 STRL IVOR (GLOVE) IMPLANT
GLOVE SURG SS PI 7.0 STRL IVOR (GLOVE) ×1 IMPLANT
GOWN STRL REUS W/TWL LRG LVL3 (GOWN DISPOSABLE) ×6 IMPLANT
GOWN STRL REUS W/TWL XL LVL3 (GOWN DISPOSABLE) ×4 IMPLANT
HOLDER FOLEY CATH W/STRAP (MISCELLANEOUS) IMPLANT
I-SEED AGX100 ×68 IMPLANT
IMPL SPACEOAR VUE SYSTEM (Spacer) IMPLANT
IMPLANT SPACEOAR VUE SYSTEM (Spacer) ×3 IMPLANT
IV NS 1000ML (IV SOLUTION) ×3
IV NS 1000ML BAXH (IV SOLUTION) ×2 IMPLANT
KIT TURNOVER CYSTO (KITS) ×3 IMPLANT
MARKER SKIN DUAL TIP RULER LAB (MISCELLANEOUS) ×3 IMPLANT
PACK CYSTO (CUSTOM PROCEDURE TRAY) ×3 IMPLANT
SURGILUBE 2OZ TUBE FLIPTOP (MISCELLANEOUS) ×3 IMPLANT
SUT BONE WAX W31G (SUTURE) IMPLANT
SYR 10ML LL (SYRINGE) ×4 IMPLANT
TOWEL OR 17X26 10 PK STRL BLUE (TOWEL DISPOSABLE) ×3 IMPLANT
UNDERPAD 30X36 HEAVY ABSORB (UNDERPADS AND DIAPERS) ×6 IMPLANT
WATER STERILE IRR 500ML POUR (IV SOLUTION) ×3 IMPLANT

## 2020-09-10 NOTE — Transfer of Care (Signed)
Immediate Anesthesia Transfer of Care Note  Patient: Daniel Gonzalez  Procedure(s) Performed: Procedure(s) (LRB): RADIOACTIVE SEED IMPLANT/BRACHYTHERAPY IMPLANT (N/A) SPACE OAR INSTILLATION (N/A)  Patient Location: PACU  Anesthesia Type: General  Level of Consciousness: awake, sedated, patient cooperative and responds to stimulation  Airway & Oxygen Therapy: Patient Spontanous Breathing and Patient connected to [place] 02 and soft FM   Post-op Assessment: Report given to PACU RN, Post -op Vital signs reviewed and stable and Patient moving all extremities  Post vital signs: Reviewed and stable  Complications: No apparent anesthesia complications

## 2020-09-10 NOTE — Anesthesia Procedure Notes (Signed)
Procedure Name: LMA Insertion Date/Time: 09/10/2020 1:22 PM Performed by: Justice Rocher, CRNA Pre-anesthesia Checklist: Patient identified, Emergency Drugs available, Suction available, Patient being monitored and Timeout performed Patient Re-evaluated:Patient Re-evaluated prior to induction Oxygen Delivery Method: Circle system utilized Preoxygenation: Pre-oxygenation with 100% oxygen Induction Type: IV induction Ventilation: Mask ventilation without difficulty LMA: LMA inserted LMA Size: 4.0 Number of attempts: 1 Airway Equipment and Method: Bite block Placement Confirmation: positive ETCO2,  breath sounds checked- equal and bilateral and CO2 detector Tube secured with: Tape Dental Injury: Teeth and Oropharynx as per pre-operative assessment

## 2020-09-10 NOTE — Op Note (Signed)
PATIENT:  Daniel Gonzalez  PRE-OPERATIVE DIAGNOSIS:  Adenocarcinoma of the prostate  POST-OPERATIVE DIAGNOSIS:  Same  PROCEDURE:  1. I-125 radioactive seed implantation 2. Cystoscopy  3. Placement of SpaceOAR  SURGEON:  Surgeon(s): Wendie Simmer, MD  Radiation oncologist: Dr. Tyler Pita  ANESTHESIA:  General  EBL:  Minimal  DRAINS: 72 French Foley catheter  INDICATION: Daniel Gonzalez  Description of procedure: After informed consent the patient was brought to the major OR, placed on the table and administered general anesthesia. He was then moved to the modified lithotomy position with his perineum perpendicular to the floor. His perineum and genitalia were then sterilely prepped. An official timeout was then performed. A 16 French Foley catheter was then placed in the bladder and filled with dilute contrast, a rectal tube was placed in the rectum and the transrectal ultrasound probe was placed in the rectum and affixed to the stand. He was then sterilely draped.  Real time ultrasonography was used along with the seed planning software [employer]. This was used to develop the seed plan including the number of needles as well as number of seeds required for complete and adequate coverage. Real-time ultrasonography was then used along with the previously developed plan and the Nucletron device to implant a total of 68 seeds using 19 needles. This proceeded without difficulty or complication.   I then proceeded with placement of SpaceOARby introducing a needle with the bevel angled inferiorly approximately 2 cm superior to the anus. This was angled downward and under direct ultrasound was placed within the space between the prostatic capsule and rectum. This was confirmed with a small amount of sterile saline injected and this was performed under direct ultrasound. I then attached the SpaceOARto the needle and injected this in the space between the prostate and rectum with  good placement noted.  A Foley catheter was then removed as well as the transrectal ultrasound probe and rectal probe. Flexible cystoscopy was then performed using the 17 French flexible scope which revealed a normal urethra throughout its length down to the sphincter which appeared intact. The prostatic urethra revealed bilobar hypertrophy but no evidence of obstruction, seeds, spacers or lesions. The bladder was then entered and fully and systematically inspected. The ureteral orifices were noted to be of normal configuration and position. The mucosa revealed no evidence of tumors. There were also no stones identified within the bladder. I noted no seeds or spacers on the floor of the bladder and retroflexion of the scope revealed no seeds protruding from the base of the prostate.  The cystoscope was then removed and the patient was awakened and taken to recovery room in stable and satisfactory condition. He tolerated procedure well and there were no intraoperative complications.

## 2020-09-10 NOTE — H&P (Signed)
CC/HPI: Pt presents today for pre-operative history and physical exam in anticipation of radioactive seed and space oar placement by Dr. Gloriann Loan on 09/10/20.   Pt denies F/C, HA, CP, SOB, N/V, diarrhea/constipation, back pain, flank pain, hematuria, and dysuria.    HX:    Daniel Gonzalez is a 65 year old male seen in follow-up today with prostate cancer.   He was referred to discuss his new diagnosis of prostate cancer and definitive treatment options.   Patient underwent prostate biopsy on 05/08/2020 for an elevated PSA of 6.7 ng/mL. Biopsy revealed GS 4+3 = 7, adenocarcinoma of the prostate with 1/12 cores positive, TRUS volume of 30 cm3. He has a family history of prostate cancer in his father and brother. Denies new or worsening bone or back pain. Good appetite and stable weight.   AUA SS: 11: Predominant urinary frequency and 5 time nocturia. However he states he drinks 4 beers a night  SHIM: Did not fill out this form however he does state he has erectile dysfunction that responds fairly well to sildenafil 100 mg  ECOG: 0   PCa Dx Date: 05/16/2020  PSA Trend: 6.7 NG/mL on 04/11/2020     ALLERGIES: Aspirin - heart flutter sulfa - Itching    MEDICATIONS: Metoprolol Succinate  Plavix 75 mg tablet  Prilosec  Acetaminophen  Alfuzosin Hcl Er 10 mg tablet, extended release 24 hr 1 tablet PO Daily 30 minutes after evening meal  Amlodipine Besylate  Biktarvy  Fentanyl  Neomycin-Polymyxin-Dexameth  Olmesartan Medoxomil 40 mg tablet  [name]  Sildenafil Citrate 100 mg tablet  Systane  Trazodone Hcl     Notes: Dr. Birdie Riddle     GU PSH: Prostate Needle Biopsy - 05/08/2020       PSH Notes: Cyst removal  wisdom tooth removal  top full plate denture  bottom partial denture   NON-GU PSH: Surgical Pathology, Gross And Microscopic Examination For Prostate Needle - 05/08/2020     GU PMH: ED due to arterial insufficiency - 07/17/2020 Nocturia (Stable) - 07/17/2020, -  10/28/2019 Prostate Cancer - 07/17/2020, - 05/17/2020 Urinary Frequency - 07/17/2020 Elevated PSA - 05/08/2020, - 10/28/2019 Straining on Urination - 04/11/2020 Urinary Urgency - 04/11/2020 BPH w/LUTS - 10/28/2019      PMH Notes: Stroke/TIA approx 6 or 7 years ago   NON-GU PMH: HIV Hypertension Stroke/TIA    FAMILY HISTORY: Death of family member - Father, Mother Myocardial Infarction - Mother, Father    Notes: 1 son; 1 daughter  2 brothers and father with CaP     SOCIAL HISTORY: Marital Status: Divorced Preferred Language: English; Race: Black or African American Current Smoking Status: Patient smokes. Has smoked since 10/17/1979. Smokes 1 pack per day.   Tobacco Use Assessment Completed: Used Tobacco in last 30 days? Does not use smokeless tobacco. Drinks 1 drink per day.  Patient uses recreational drugs. Uses marijuana. Does not drink caffeine. Has not had a blood transfusion. 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     Notes: ETOH 2 liquor drinks and 3-4 beers/day  Marijuana 1 joint a day   REVIEW OF SYSTEMS:    GU Review Male:   Patient denies leakage of urine, penile pain, hard to postpone urination, get up at night to urinate, have to strain to urinate , burning/ pain with urination, stream starts and stops, erection problems, frequent urination, and trouble starting your stream.  Gastrointestinal (Upper):   Patient denies nausea, vomiting, and indigestion/ heartburn.  Gastrointestinal (Lower):   Patient reports diarrhea. Patient denies  constipation.  Constitutional:   Patient denies fever, night sweats, weight loss, and fatigue.  Skin:   Patient denies skin rash/ lesion and itching.  Eyes:   Patient denies blurred vision and double vision.  Ears/ Nose/ Throat:   Patient denies sore throat and sinus problems.  Hematologic/Lymphatic:   Patient denies swollen glands and easy bruising.  Cardiovascular:   Patient denies leg swelling and chest pains.  Respiratory:    Patient denies cough and shortness of breath.  Endocrine:   Patient denies excessive thirst.  Musculoskeletal:   Patient denies back pain and joint pain.  Neurological:   Patient denies headaches and dizziness.  Psychologic:   Patient denies depression and anxiety.   VITAL SIGNS:      08/28/2020 01:31 PM  Weight 114 lb / 51.71 kg  Height 64 in / 162.56 cm  BP 109/76 mmHg  Heart Rate 80 /min  Temperature 98.0 F / 36.6 C  BMI 19.6 kg/m   MULTI-SYSTEM PHYSICAL EXAMINATION:    Constitutional: Well-nourished. No physical deformities. Normally developed. Good grooming.  Neck: Neck symmetrical, not swollen. Normal tracheal position.  Respiratory: Normal breath sounds. No labored breathing, no use of accessory muscles.   Cardiovascular: Regular rate and rhythm. No murmur, no gallop.   Lymphatic: No enlargement of neck, axillae, groin.  Skin: No paleness, no jaundice, no cyanosis. No lesion, no ulcer, no rash.  Neurologic / Psychiatric: Oriented to time, oriented to place, oriented to person. No depression, no anxiety, no agitation.  Gastrointestinal: No mass, no tenderness, no rigidity, non obese abdomen.  Eyes: Normal conjunctivae. Normal eyelids.  Ears, Nose, Mouth, and Throat: Left ear no scars, no lesions, no masses. Right ear no scars, no lesions, no masses. Nose no scars, no lesions, no masses. Normal hearing. Normal lips.  Musculoskeletal: Normal gait and station of head and neck.     Complexity of Data:  Records Review:   Previous Patient Records  Urine Test Review:   Urinalysis   08/28/20 08/28/20  Urinalysis  Urine Appearance Slightly Cloudy  Slightly Cloudy   Urine Color Orange  Orange   Urine Glucose Invalid mg/dL Invalid   Urine Bilirubin Invalid mg/dL Invalid   Urine Ketones Invalid mg/dL Invalid   Urine Specific Gravity Invalid  Invalid   Urine Blood Invalid ery/uL Invalid   Urine pH Invalid  Invalid   Urine Protein Invalid mg/dL Invalid   Urine Urobilinogen Invalid  mg/dL Invalid   Urine Nitrites Invalid  Invalid   Urine Leukocyte Esterase Invalid leu/uL Invalid   Urine WBC/hpf 0 - 5/hpf  0 - 5/hpf   Urine RBC/hpf 0 - 2/hpf  0 - 2/hpf   Urine Epithelial Cells 0 - 5/hpf  0 - 5/hpf   Urine Bacteria Rare (0-9/hpf)  Rare (0-9/hpf)   Urine Mucous Not Present  Not Present   Urine Yeast NS (Not Seen)  NS (Not Seen)   Urine Trichomonas Not Present  Not Present   Urine Cystals NS (Not Seen)  NS (Not Seen)   Urine Casts NS (Not Seen)  NS (Not Seen)   Urine Sperm Not Present  Not Present    PROCEDURES:          Urinalysis w/Scope - 81001 Dipstick Dipstick Cont'd Micro  Color: Orange Bilirubin: Invalid WBC/hpf: 0 - 5/hpf  Appearance: Slightly Cloudy Ketones: Invalid RBC/hpf: 0 - 2/hpf  Specific Gravity: Invalid Blood: Invalid Bacteria: Rare (0-9/hpf)  pH: Invalid Protein: Invalid Cystals: NS (Not Seen)  Glucose: Invalid Urobilinogen: Invalid  Casts: NS (Not Seen)    Nitrites: Invalid Trichomonas: Not Present    Leukocyte Esterase: Invalid Mucous: Not Present      Epithelial Cells: 0 - 5/hpf      Yeast: NS (Not Seen)      Sperm: Not Present    Notes:  microscopic not concentrated invalid due to color interference    ASSESSMENT:      ICD-10 Details  1 GU:   Prostate Cancer - C61    PLAN:           Orders Labs CULTURE, URINE          Schedule Return Visit/Planned Activity: Keep Scheduled Appointment - Schedule Surgery          Document Letter(s):  Created for Patient: Clinical Summary         Notes:   There are no changes in the patients history or physical exam since last evaluation by Dr. Gloriann Loan. Pt is scheduled to undergo seeds and space oar on 09/10/20.   Some bacteria noted on UA. Will check culture to r/o infection prior to procedure.   All pt's questions were answered to the best of my ability.          Next Appointment:      Next Appointment: 09/10/2020 01:00 PM    Appointment Type: Surgery     Location: [facility] [phone]    Provider: Link Snuffer, III, M.D.    Reason for Visit: OP NE SEED SPACE OAR     Signed by Mcarthur Rossetti, PA on 08/28/20 at 2:00 PM (EST

## 2020-09-10 NOTE — Interval H&P Note (Signed)
History and Physical Interval Note:  09/10/2020 12:36 PM  Daniel Gonzalez  has presented today for surgery, with the diagnosis of PROSTATE CANCER.  The various methods of treatment have been discussed with the patient and family. After consideration of risks, benefits and other options for treatment, the patient has consented to  Procedure(s): RADIOACTIVE SEED IMPLANT/BRACHYTHERAPY IMPLANT (N/A) SPACE OAR INSTILLATION (N/A) CYSTOSCOPY FLEXIBLE (N/A) as a surgical intervention.  The patient's history has been reviewed, patient examined, no change in status, stable for surgery.  I have reviewed the patient's chart and labs.  Questions were answered to the patient's satisfaction.     Marton Redwood, III

## 2020-09-10 NOTE — Anesthesia Postprocedure Evaluation (Signed)
Anesthesia Post Note  Patient: Daniel Gonzalez  Procedure(s) Performed: RADIOACTIVE SEED IMPLANT/BRACHYTHERAPY IMPLANT (N/A Prostate) SPACE OAR INSTILLATION (N/A Rectum)     Patient location during evaluation: Phase II Anesthesia Type: General Level of consciousness: awake Pain management: pain level controlled Vital Signs Assessment: post-procedure vital signs reviewed and stable Respiratory status: spontaneous breathing Cardiovascular status: stable Postop Assessment: no apparent nausea or vomiting Anesthetic complications: no   No complications documented.  Last Vitals:  Vitals:   09/10/20 1500 09/10/20 1515  BP: 133/87 134/86  Pulse: (!) 57 (!) 55  Resp: 13 10  Temp:  36.7 C  SpO2: 100% 98%    Last Pain:  Vitals:   09/10/20 1515  TempSrc:   PainSc: 2                  John F Salome Arnt

## 2020-09-10 NOTE — Discharge Instructions (Signed)
Post Anesthesia Home Care Instructions  Activity: Get plenty of rest for the remainder of the day. A responsible adult should stay with you for 24 hours following the procedure.  For the next 24 hours, DO NOT: -Drive a car -Paediatric nurse -Drink alcoholic beverages -Take any medication unless instructed by your physician -Make any legal decisions or sign important papers.  Meals: Start with liquid foods such as gelatin or soup. Progress to regular foods as tolerated. Avoid greasy, spicy, heavy foods. If nausea and/or vomiting occur, drink only clear liquids until the nausea and/or vomiting subsides. Call your physician if vomiting continues.  Special Instructions/Symptoms: Your throat may feel dry or sore from the anesthesia or the breathing tube placed in your throat during surgery. If this causes discomfort, gargle with warm salt water. The discomfort should disappear within 24 hours.  If you had a scopolamine patch placed behind your ear for the management of post- operative nausea and/or vomiting:  1. The medication in the patch is effective for 72 hours, after which it should be removed.  Wrap patch in a tissue and discard in the trash. Wash hands thoroughly with soap and water. 2. You may remove the patch earlier than 72 hours if you experience unpleasant side effects which may include dry mouth, dizziness or visual disturbances. 3. Avoid touching the patch. Wash your hands with soap and water after contact with the patch.   Radioactive Seed Implant Home Care Instructions   Activity:    Rest for the remainder of the day.  Do not drive or operate equipment today.  You may resume normal  activities in a few days as instructed by your physician, without risk of harmful radiation exposure to those around you, provided you follow the time and distance precautions on the Radiation Oncology Instruction Sheet.   Meals: Drink plenty of lipuids and eat light foods, such as gelatin or  soup this evening .  You may return to normal meal plan tomorrow.  Return To Work: You may return to work as instructed by Naval [employer].  Special Instruction:   If any seeds are found, use tweezers to pick up seeds and place in a glass container of any kind and bring to your physician's office.  Call your physician if any of these symptoms occur:   Persistent or heavy bleeding  Urine stream diminishes or stops completely after catheter is removed  Fever equal to or greater than 101 degrees F  Cloudy urine with a strong foul odor  Severe pain  You may feel some burning pain and/or hesitancy when you urinate after the catheter is removed.  These symptoms may increase over the next few weeks, but should diminish within forur to six weeks.  Applying moist heat to the lower abdomen or a hot tub bath may help relieve the pain.  If the discomfort becomes severe, please call your physician for additional medications.  Radioactive Seed Implant Home Care Instructions   Activity:    Rest for the remainder of the day.  Do not drive or operate equipment today.  You may resume normal  activities in a few days as instructed by your physician, without risk of harmful radiation exposure to those around you, provided you follow the time and distance precautions on the Radiation Oncology Instruction Sheet.   Meals: Drink plenty of lipuids and eat light foods, such as gelatin or soup this evening .  You may return to normal meal plan tomorrow.  Return To Work: [employer]  may return to work as instructed by your physician.  Special Instruction:   If any seeds are found, use tweezers to pick up seeds and place in a glass container of any kind and bring to your physician's office.  Call your physician if any of these symptoms occur:   Persistent or heavy bleeding  Urine stream diminishes or stops completely after catheter is removed  Fever equal to or greater than 101 degrees F  Cloudy urine with a  strong foul odor  Severe pain  You may feel some burning pain and/or hesitancy when you urinate after the catheter is removed.  These symptoms may increase over the next few weeks, but should diminish within forur to six weeks.  Applying moist heat to the lower abdomen or a hot tub bath may help relieve the pain.  If the discomfort becomes severe, please call your physician for additional medications.

## 2020-09-11 ENCOUNTER — Encounter (HOSPITAL_BASED_OUTPATIENT_CLINIC_OR_DEPARTMENT_OTHER): Payer: Self-pay | Admitting: Urology

## 2020-10-01 MED FILL — PREZCOBIX 800 MG-150 MG TAB: 800-150 | 30 days supply | Qty: 30 | Fill #5

## 2020-10-01 MED FILL — BIKTARVY 50-200-25 MG TABS: 50-200-25 | 30 days supply | Qty: 30 | Fill #5

## 2020-10-03 ENCOUNTER — Telehealth: Payer: Self-pay | Admitting: *Deleted

## 2020-10-03 NOTE — Telephone Encounter (Signed)
CALLED PATIENT TO REMIND OF POST SEED APPTS. FOR 10-04-20, LVM FOR A RETURN CALL

## 2020-10-04 ENCOUNTER — Encounter: Payer: Self-pay | Admitting: Medical Oncology

## 2020-10-04 ENCOUNTER — Other Ambulatory Visit: Payer: Self-pay

## 2020-10-04 ENCOUNTER — Ambulatory Visit
Admission: RE | Admit: 2020-10-04 | Discharge: 2020-10-04 | Disposition: A | Discharge status: Home / Self Care | Payer: 59 | Source: Ambulatory Visit | Attending: Radiation Oncology | Admitting: Radiation Oncology

## 2020-10-04 ENCOUNTER — Ambulatory Visit
Admission: RE | Admit: 2020-10-04 | Discharge: 2020-10-04 | Disposition: A | Discharge status: Home / Self Care | Payer: 59 | Source: Ambulatory Visit | Attending: Urology | Admitting: Urology

## 2020-10-04 ENCOUNTER — Encounter: Payer: Self-pay | Admitting: Urology

## 2020-10-04 VITALS — BP 118/85 | HR 53 | Temp 97.7°F | Resp 18 | Wt 118.5 lb

## 2020-10-04 VITALS — BP 118/85 | HR 53 | Temp 97.7°F | Resp 18 | Ht 64.0 in | Wt 118.8 lb

## 2020-10-04 DIAGNOSIS — C61 Malignant neoplasm of prostate: Secondary | ICD-10-CM

## 2020-10-04 DIAGNOSIS — Z79899 Other long term (current) drug therapy: Secondary | ICD-10-CM | POA: Insufficient documentation

## 2020-10-04 DIAGNOSIS — Z923 Personal history of irradiation: Secondary | ICD-10-CM | POA: Insufficient documentation

## 2020-10-04 NOTE — Progress Notes (Signed)
Radiation Oncology         [phone] ________________________________  Name: Daniel Gonzalez MRN: [id number]  Date: 10/04/2020  DOB: 04-19-1956  Post-Seed Follow-Up Visit Note  CC: Midge Minium, MD  Lucas Mallow, MD  Diagnosis:   65 y.o.gentleman with Stage T1cadenocarcinoma of the prostate with Gleason score of 4+3, and PSA of6.71.    ICD-10-CM   1. Malignant neoplasm of prostate (HCC)  C61     Interval Since Last Radiation:  3.5 weeks 09/10/20:  Insertion of radioactive I-125 seeds into the prostate gland; 145 Gy, definitive therapy with placement of SpaceOAR VUE gel.  Narrative:  The patient returns today for routine follow-up.  He is complaining of increased urinary frequency and urinary hesitation symptoms. He filled out a questionnaire regarding urinary function today providing and overall IPSS score of 13 characterizing his symptoms as moderate with a weaker flow of stream, intermittency, feelings of incomplete emptying and nocturia x3 per night.  He continues taking Uroxatrol daily as prescribed and reports that the LUTS are gradually improving.  His pre-implant score was 6. He denies any abdominal pain or bowel symptoms.  He has not noticed any significant change in his energy level and is overall quite pleased with his progress to date.  ALLERGIES:  is allergic to asa [aspirin] and sulfamethoxazole-trimethoprim.  Meds: Current Outpatient Medications  Medication Sig Dispense Refill  . acetaminophen (TYLENOL) 650 MG CR tablet Take 1,300 mg by mouth every 8 (eight) hours as needed for pain.    Marland Kitchen alfuzosin (UROXATRAL) 10 MG 24 hr tablet Take 10 mg by mouth daily.    Marland Kitchen amLODipine (NORVASC) 10 MG tablet TAKE 1 TABLET(10 MG) BY MOUTH DAILY 90 tablet 0  . BIKTARVY 50-200-25 MG TABS tablet TAKE 1 TABLET BY MOUTH DAILY. 30 tablet 5  . clopidogrel (PLAVIX) 75 MG tablet Take 1 tablet (75 mg total) by mouth daily. 90 tablet 1  . HYDROcodone-acetaminophen (NORCO) 5-325  MG tablet Take 1 tablet by mouth every 4 (four) hours as needed for moderate pain. 10 tablet 0  . metoprolol succinate (TOPROL-XL) 50 MG 24 hr tablet TAKE 1 TABLET(50 MG) BY MOUTH DAILY 90 tablet 0  . Misc. Devices (PILL SPLITTER) MISC Please use to cut trazodone in 1/2. 1 each 0  . olmesartan (BENICAR) 40 MG tablet Take 1 tablet (40 mg total) by mouth daily. 90 tablet 0  . omeprazole (PRILOSEC) 20 MG capsule Take 20 mg by mouth at bedtime.    Vladimir Faster Glycol-Propyl Glycol 0.4-0.3 % SOLN Apply 1 drop to eye 2 (two) times daily.    Marland Kitchen [name] 800-150 MG tablet TAKE 1 TABLET BY MOUTH DAILY. SWALLOW WHOLE. DO NOT CRUSH, BREAK OR CHEW TABLETS. TAKE WITH FOOD. 30 tablet 5  . sildenafil (VIAGRA) 100 MG tablet Take 1 tablet (100 mg total) by mouth daily as needed for erectile dysfunction. 90 tablet 0  . traZODone (DESYREL) 50 MG tablet TAKE 1 AND 1/2 TABLET BY MOUTH AT BEDTIME 45 tablet 6   No current facility-administered medications for this visit.    Physical Findings: In general this is a well appearing African American male in no acute distress. He's alert and oriented x4 and appropriate throughout the examination. Cardiopulmonary assessment is negative for acute distress and he exhibits normal effort.   Lab Findings: Lab Results  Component Value Date   WBC 3.7 (L) 09/06/2020   HGB 13.3 09/06/2020   HCT 37.8 (L) 09/06/2020   MCV 100.5 (H)  09/06/2020   PLT 236 09/06/2020    Radiographic Findings:  Patient underwent CT imaging in our clinic for post implant dosimetry. The CT will be reviewed by Dr. Tammi Klippel to confirm there is an adequate distribution of radioactive seeds throughout the prostate gland and ensure that there are no seeds in or near the rectum. We suspect the final radiation plan and dosimetry will show appropriate coverage of the prostate gland. He understands that we will call and inform him of any unexpected findings on further review of his imaging and  dosimetry.  Impression/Plan: 65 y.o.gentleman with Stage T1cadenocarcinoma of the prostate with Gleason score of 4+3, and PSA of6.71. The patient is recovering from the effects of radiation. His urinary symptoms should gradually improve over the next 4-6 months. We talked about this today. He is encouraged by his improvement already and is otherwise pleased with his outcome. We also talked about long-term follow-up for prostate cancer following seed implant. He understands that ongoing PSA determinations and digital rectal exams will help perform surveillance to rule out disease recurrence. He had a postprocedure visit with Jiles Crocker, NP on 10/02/2020 and is scheduled for a follow up appointment for labs on 12/27/2020 and will see Dr. Gloriann Loan the following week. He understands what to expect with his PSA measures. Patient was also educated today about some of the long-term effects from radiation including a small risk for rectal bleeding and possibly erectile dysfunction. We talked about some of the general management approaches to these potential complications. However, I did encourage the patient to contact our office or return at any point if he has questions or concerns related to his previous radiation and prostate cancer.    Nicholos Johns, PA-C

## 2020-10-04 NOTE — Progress Notes (Signed)
  Radiation Oncology         [phone] ________________________________  Name: Daniel Gonzalez MRN: [id number]  Date: 10/04/2020  DOB: 11/26/1955  COMPLEX SIMULATION NOTE  NARRATIVE:  The patient was brought to [facility] today following prostate seed implantation approximately one month ago.  Identity was confirmed.  All relevant records and images related to the planned course of therapy were reviewed.  Then, the patient was set-up supine.  CT images were obtained.  The CT images were loaded into the planning software.  Then the prostate and rectum were contoured.  Treatment planning then occurred.  The implanted iodine 125 seeds were identified by the physics staff for projection of radiation distribution  I have requested : 3D Simulation  I have requested a DVH of the following structures: Prostate and rectum.    ________________________________  Sheral Apley Tammi Klippel, M.D.

## 2020-10-04 NOTE — Progress Notes (Signed)
Patient denies any dysuria or hematuria.Patient reports that he uses the restroom every two hours. Patient denies any issues with his bowels. Patient denies any leakage or urgency with urination.Patient states that his stream varies from strong to weak. Patient states that he empties his bladder with urination. Patient denies having to strain to start his stream.Patient states that he last seen his urologist on 10/02/2020. Vitals:   10/04/20 0837  BP: 118/85  Pulse: (!) 53  Resp: 18  Temp: 97.7 F (36.5 C)  TempSrc: Temporal  SpO2: 100%  Weight: 53.8 kg

## 2020-10-05 ENCOUNTER — Other Ambulatory Visit: Payer: Self-pay

## 2020-10-05 DIAGNOSIS — I10 Essential (primary) hypertension: Secondary | ICD-10-CM

## 2020-10-05 MED ORDER — AMLODIPINE BESYLATE 10 MG PO TABS: ORAL_TABLET | ORAL | 0 refills | Status: DC

## 2020-10-05 MED ORDER — METOPROLOL SUCCINATE ER 50 MG PO TB24: ORAL_TABLET | ORAL | 0 refills | Status: DC

## 2020-10-05 MED ORDER — OLMESARTAN MEDOXOMIL 40 MG PO TABS: 40.0000 mg | ORAL_TABLET | Freq: Every day | ORAL | 0 refills | Status: DC

## 2020-10-15 ENCOUNTER — Telehealth: Payer: Self-pay | Admitting: Family Medicine

## 2020-10-16 NOTE — Telephone Encounter (Signed)
Encounter started in error.

## 2020-10-18 ENCOUNTER — Encounter: Payer: Self-pay | Admitting: Radiation Oncology

## 2020-10-18 ENCOUNTER — Telehealth (INDEPENDENT_AMBULATORY_CARE_PROVIDER_SITE_OTHER): Payer: 59 | Admitting: Family Medicine

## 2020-10-18 ENCOUNTER — Encounter: Payer: Self-pay | Admitting: Family Medicine

## 2020-10-18 ENCOUNTER — Ambulatory Visit
Admission: RE | Admit: 2020-10-18 | Discharge: 2020-10-18 | Disposition: A | Discharge status: Home / Self Care | Payer: 59 | Source: Ambulatory Visit | Attending: Radiation Oncology | Admitting: Radiation Oncology

## 2020-10-18 DIAGNOSIS — C61 Malignant neoplasm of prostate: Secondary | ICD-10-CM

## 2020-10-18 DIAGNOSIS — I10 Essential (primary) hypertension: Secondary | ICD-10-CM | POA: Diagnosis not present

## 2020-10-18 DIAGNOSIS — G4700 Insomnia, unspecified: Secondary | ICD-10-CM

## 2020-10-18 MED ORDER — TRAZODONE HCL 100 MG PO TABS: 100.0000 mg | ORAL_TABLET | Freq: Every day | ORAL | 1 refills | Status: DC

## 2020-10-18 NOTE — Progress Notes (Signed)
I connected with  Daniel Gonzalez on 10/18/20 by a video enabled telemedicine application and verified that I am speaking with the correct person using two identifiers.   I discussed the limitations of evaluation and management by telemedicine. The patient expressed understanding and agreed to proceed.

## 2020-10-18 NOTE — Progress Notes (Signed)
Virtual Visit via Video   I connected with patient on 10/18/20 at  1:00 PM EST by a video enabled telemedicine application and verified that I am speaking with the correct person using two identifiers.  Location patient: Home Location provider: [facility], Office Persons participating in the virtual visit: Patient, Provider, [place] (Sabrina M)  I discussed the limitations of evaluation and management by telemedicine and the availability of in person appointments. The patient expressed understanding and agreed to proceed.  Subjective:   HPI:   HTN- chronic problem.  On Amlodipine 10mg  daily, Metoprolol XL 50mg  daily, Olmesartan 40mg  daily w/ good control.  Unable to check BP today but was 118/85 on 10/04/20.  No CP, SOB, HAs, visual changes, edema.  Prostate cancer- s/p radioactive seeds.  Currently not to be around pregnant women and children.  Remains uncomfortable to drive.  Has follow up in June  Insomnia- pt reports he has to wake 2-3x/night to urinate.  Once he is up, it takes ~1 hr to fall back asleep.  Currently on Trazodone 75mg  nightly.  Asking to increase.    ROS:   See pertinent positives and negatives per HPI.  Patient Active Problem List   Diagnosis Date Noted  . Malignant neoplasm of prostate ([place]) 06/19/2020  . Depression 04/17/2020  . Elevated PSA 04/17/2020  . Chronic diarrhea 09/07/2019  . Ruptured epithelial cyst 06/08/2019  . Impacted cerumen of left ear 05/17/2019  . B12 deficiency 10/23/2017  . Protein-calorie malnutrition ([place]) 08/25/2015  . Normocytic anemia 08/24/2015  . Cerebral aneurysm, nonruptured 09/02/2013  . History of CVA (cerebrovascular accident) 06/22/2013  . Routine general medical examination at a health care facility 01/07/2013  . Essential hypertension 05/01/2010  . CARPAL TUNNEL SYNDROME 01/02/2010  . Human immunodeficiency virus (HIV) disease ([place]) 09/17/2006  . GENITAL HERPES 09/17/2006  . ERECTILE DYSFUNCTION 09/17/2006   . 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 CIGARETTE SMOKER 09/17/2006  . DIVERTICULOSIS, COLON W/O HEM 09/17/2006    Social History   Tobacco Use  . Smoking status: Current Every Day Smoker    Packs/day: 1.00    Years: 35.00    Pack years: 35.00    Types: Cigarettes  . Smokeless tobacco: Never Used  . Tobacco comment: slowing down  Substance Use Topics  . Alcohol use: Yes    Comment: 3-4 beers per day and few shots of liquor    Current Outpatient Medications:  .  acetaminophen (TYLENOL) 650 MG CR tablet, Take 1,300 mg by mouth every 8 (eight) hours as needed for pain., Disp: , Rfl:  .  alfuzosin (UROXATRAL) 10 MG 24 hr tablet, Take 10 mg by mouth daily., Disp: , Rfl:  .  amLODipine (NORVASC) 10 MG tablet, TAKE 1 TABLET(10 MG) BY MOUTH DAILY, Disp: 90 tablet, Rfl: 0 .  BIKTARVY 50-200-25 MG TABS tablet, TAKE 1 TABLET BY MOUTH DAILY., Disp: 30 tablet, Rfl: 5 .  clopidogrel (PLAVIX) 75 MG tablet, Take 1 tablet (75 mg total) by mouth daily., Disp: 90 tablet, Rfl: 1 .  metoprolol succinate (TOPROL-XL) 50 MG 24 hr tablet, TAKE 1 TABLET(50 MG) BY MOUTH DAILY, Disp: 90 tablet, Rfl: 0 .  Misc. Devices (PILL SPLITTER) MISC, Please use to cut trazodone in 1/2., Disp: 1 each, Rfl: 0 .  olmesartan (BENICAR) 40 MG tablet, Take 1 tablet (40 mg total) by mouth daily., Disp: 90 tablet, Rfl: 0 .  omeprazole (PRILOSEC) 20 MG capsule, Take 20 mg by mouth at bedtime., Disp: , Rfl:  .  Polyethyl Glycol-Propyl Glycol 0.4-0.3 % SOLN, Apply 1 drop to eye 2 (two) times daily., Disp: , Rfl:  .  [name] 800-150 MG tablet, TAKE 1 TABLET BY MOUTH DAILY. SWALLOW WHOLE. DO NOT CRUSH, BREAK OR CHEW TABLETS. TAKE WITH FOOD., Disp: 30 tablet, Rfl: 5 .  traZODone (DESYREL) 50 MG tablet, TAKE 1 AND 1/2 TABLET BY MOUTH AT BEDTIME, Disp: 45 tablet, Rfl: 6 .  HYDROcodone-acetaminophen (NORCO) 5-325 MG tablet, Take 1 tablet by mouth every 4 (four) hours as needed for moderate pain. (Patient not taking: Reported on  10/18/2020), Disp: 10 tablet, Rfl: 0 .  sildenafil (VIAGRA) 100 MG tablet, Take 1 tablet (100 mg total) by mouth daily as needed for erectile dysfunction. (Patient not taking: No sig reported), Disp: 90 tablet, Rfl: 0  Allergies  Allergen Reactions  . Asa [Aspirin] Palpitations    Speeds up heart rate  . Sulfamethoxazole-Trimethoprim Itching    Objective:   There were no vitals taken for this visit. AAOx3, NAD NCAT, EOMI No obvious CN deficits Coloring WNL Pt is able to speak clearly, coherently without shortness of breath or increased work of breathing.  Thought process is linear.  Mood is appropriate.   Assessment and Plan:   HTN- chronic problem.  Not able to check BP today but recent BP was well controlled on Amlodipine 10mg  daily, Metoprolol XL 50mg  daily, Olmesartan 40mg  daily.  Reviewed recent CMP- WNL.  No need to repeat.  Will continue to follow.  Prostate Cancer- pt was appreciative for video visit today b/c he now has radiation seeds and it is uncomfortable to sit in the car.  He is not to be around children or pregnant women.  He has follow up in June w/ Urology.  Will follow along.  Insomnia- deteriorated.  Pt was previously doing well on Trazodone 75mg  daily but since his procedure he is having to get up 2-3x/night to use the bathroom and each time it takes him ~1 hr to fall back asleep.  Will increase Trazodone to 100mg  nightly and monitor for improvement.  Pt expressed understanding and is in agreement w/ plan.    Annye Asa, MD 10/18/2020

## 2020-10-22 ENCOUNTER — Other Ambulatory Visit: Payer: Self-pay | Admitting: Internal Medicine

## 2020-10-22 ENCOUNTER — Other Ambulatory Visit (HOSPITAL_COMMUNITY): Payer: Self-pay | Admitting: Internal Medicine

## 2020-10-22 DIAGNOSIS — B20 Human immunodeficiency virus [HIV] disease: Secondary | ICD-10-CM

## 2020-10-23 NOTE — Progress Notes (Signed)
  Radiation Oncology         [phone] ________________________________  Name: Daniel Gonzalez MRN: [id number]  Date: 10/18/2020  DOB: 1956/01/31  3D Planning Note   Prostate Brachytherapy Post-Implant Dosimetry  Diagnosis: 65 y.o. gentleman with Stage T1c adenocarcinoma of the prostate with Gleason score of 4+3, and PSA of 6.71.  Narrative: On a previous date, Daniel Gonzalez returned following prostate seed implantation for post implant planning. He underwent CT scan complex simulation to delineate the three-dimensional structures of the pelvis and demonstrate the radiation distribution.  Since that time, the seed localization, and complex isodose planning with dose volume histograms have now been completed.  Results:   Prostate Coverage - The dose of radiation delivered to the 90% or more of the prostate gland (D90) was 96.06% of the prescription dose. This exceeds our goal of greater than 90%. Rectal Sparing - The volume of rectal tissue receiving the prescription dose or higher was 0.0 cc. This falls under our thresholds tolerance of 1.0 cc.  Impression: The prostate seed implant appears to show adequate target coverage and appropriate rectal sparing.  Plan:  The patient will continue to follow with urology for ongoing PSA determinations. I would anticipate a high likelihood for local tumor control with minimal risk for rectal morbidity.  ________________________________  Sheral Apley Tammi Klippel, M.D.

## 2020-10-24 MED FILL — PREZCOBIX 800 MG-150 MG TAB: 800-150 | 30 days supply | Qty: 30 | Fill #0

## 2020-10-24 MED FILL — BIKTARVY 50-200-25 MG TABS: 50-200-25 | 30 days supply | Qty: 30 | Fill #0

## 2020-11-09 ENCOUNTER — Other Ambulatory Visit (HOSPITAL_COMMUNITY): Payer: Self-pay

## 2020-11-17 ENCOUNTER — Other Ambulatory Visit (HOSPITAL_COMMUNITY): Payer: Self-pay

## 2020-11-17 MED FILL — Darunavir-Cobicistat Tab 800-150 MG: ORAL | 30 days supply | Qty: 30 | Fill #0 | Status: AC

## 2020-11-17 MED FILL — Bictegravir-Emtricitabine-Tenofovir AF Tab 50-200-25 MG: ORAL | 30 days supply | Qty: 30 | Fill #0 | Status: AC

## 2020-11-19 ENCOUNTER — Other Ambulatory Visit (HOSPITAL_COMMUNITY): Payer: Self-pay

## 2020-11-20 ENCOUNTER — Other Ambulatory Visit (HOSPITAL_COMMUNITY): Payer: Self-pay

## 2020-11-21 ENCOUNTER — Other Ambulatory Visit (HOSPITAL_COMMUNITY): Payer: Self-pay

## 2020-12-04 ENCOUNTER — Encounter: Payer: Self-pay | Admitting: Medical Oncology

## 2020-12-04 NOTE — Progress Notes (Signed)
Patient called stating he was continuing to have burning with urination and nocturia. He asked if Dr. Tammi Klippel would prescribe him antibiotics. I informed him that he has been released back to Dr. Gloriann Loan and he should call his office. We dicussed the possibility of having a UTI vs side effects of brachytherapy. He states he did leave a message for them this morning but has not heard back.

## 2020-12-05 ENCOUNTER — Other Ambulatory Visit: Payer: Self-pay

## 2020-12-05 DIAGNOSIS — Z8673 Personal history of transient ischemic attack (TIA), and cerebral infarction without residual deficits: Secondary | ICD-10-CM

## 2020-12-05 MED ORDER — CLOPIDOGREL BISULFATE 75 MG PO TABS: 75.0000 mg | ORAL_TABLET | Freq: Every day | ORAL | 0 refills | Status: DC

## 2020-12-07 ENCOUNTER — Encounter: Payer: Self-pay | Admitting: Medical Oncology

## 2020-12-10 ENCOUNTER — Encounter: Payer: Self-pay | Admitting: Medical Oncology

## 2020-12-10 NOTE — Progress Notes (Signed)
Spoke with patient to inform him I spoke with [facility]. She could not see where he called in last week. I informed her he was having burning and pain. Daniel Gonzalez will call  him to come in for a urinalysis. He states he did receive a call a few minutes ago from [place].  He voiced appreciation.

## 2020-12-10 NOTE — Progress Notes (Signed)
Patient called asking about antibiotics. I reminded him he needs to call Dr. Purvis Sheffield office to get a urinalysis.

## 2020-12-10 NOTE — Progress Notes (Signed)
Patient called stating I never returned his call. I informed him that we spoke on Friday and I reminded him he needs to call [facility]. He states he did call them last week but has not heard back. I will follow up with Dr. Purvis Sheffield office and give him a call back. He voiced understanding.

## 2020-12-17 ENCOUNTER — Other Ambulatory Visit (HOSPITAL_COMMUNITY): Payer: Self-pay

## 2020-12-17 MED FILL — Darunavir-Cobicistat Tab 800-150 MG: ORAL | 30 days supply | Qty: 30 | Fill #1 | Status: AC

## 2020-12-17 MED FILL — Bictegravir-Emtricitabine-Tenofovir AF Tab 50-200-25 MG: ORAL | 30 days supply | Qty: 30 | Fill #1 | Status: AC

## 2020-12-20 ENCOUNTER — Other Ambulatory Visit (HOSPITAL_COMMUNITY): Payer: Self-pay

## 2021-01-02 ENCOUNTER — Other Ambulatory Visit: Payer: Self-pay

## 2021-01-02 DIAGNOSIS — I10 Essential (primary) hypertension: Secondary | ICD-10-CM

## 2021-01-02 MED ORDER — METOPROLOL SUCCINATE ER 50 MG PO TB24: ORAL_TABLET | ORAL | 0 refills | Status: DC

## 2021-01-02 MED ORDER — OLMESARTAN MEDOXOMIL 40 MG PO TABS: 40.0000 mg | ORAL_TABLET | Freq: Every day | ORAL | 0 refills | Status: DC

## 2021-01-02 MED ORDER — AMLODIPINE BESYLATE 10 MG PO TABS: ORAL_TABLET | ORAL | 0 refills | Status: DC

## 2021-01-07 ENCOUNTER — Other Ambulatory Visit (HOSPITAL_COMMUNITY): Payer: Self-pay

## 2021-01-08 ENCOUNTER — Encounter: Payer: Self-pay | Admitting: Internal Medicine

## 2021-01-09 ENCOUNTER — Other Ambulatory Visit (HOSPITAL_COMMUNITY): Payer: Self-pay

## 2021-01-09 MED FILL — Darunavir-Cobicistat Tab 800-150 MG: ORAL | 30 days supply | Qty: 30 | Fill #2 | Status: AC

## 2021-01-09 MED FILL — Bictegravir-Emtricitabine-Tenofovir AF Tab 50-200-25 MG: ORAL | 30 days supply | Qty: 30 | Fill #2 | Status: AC

## 2021-01-11 ENCOUNTER — Other Ambulatory Visit (HOSPITAL_COMMUNITY): Payer: Self-pay

## 2021-02-11 ENCOUNTER — Other Ambulatory Visit (HOSPITAL_COMMUNITY): Payer: Self-pay

## 2021-02-11 MED FILL — Darunavir-Cobicistat Tab 800-150 MG: ORAL | 30 days supply | Qty: 30 | Fill #3 | Status: AC

## 2021-02-11 MED FILL — Bictegravir-Emtricitabine-Tenofovir AF Tab 50-200-25 MG: ORAL | 30 days supply | Qty: 30 | Fill #3 | Status: AC

## 2021-02-14 ENCOUNTER — Other Ambulatory Visit (HOSPITAL_COMMUNITY): Payer: Self-pay

## 2021-03-08 ENCOUNTER — Other Ambulatory Visit: Payer: Self-pay | Admitting: Internal Medicine

## 2021-03-08 ENCOUNTER — Other Ambulatory Visit (HOSPITAL_COMMUNITY): Payer: Self-pay

## 2021-03-08 MED ORDER — BIKTARVY 50-200-25 MG PO TABS
1.0000 | ORAL_TABLET | Freq: Every day | ORAL | 1 refills | Status: DC
  Filled 2021-03-08: qty 30, 30d supply, fill #0
  Filled 2021-04-08: qty 30, 30d supply, fill #1

## 2021-03-08 MED ORDER — PREZCOBIX 800-150 MG PO TABS
ORAL_TABLET | ORAL | 1 refills | Status: DC
  Filled 2021-03-08: qty 30, 30d supply, fill #0
  Filled 2021-04-08: qty 30, 30d supply, fill #1

## 2021-03-12 ENCOUNTER — Other Ambulatory Visit: Payer: Self-pay

## 2021-03-12 DIAGNOSIS — Z8673 Personal history of transient ischemic attack (TIA), and cerebral infarction without residual deficits: Secondary | ICD-10-CM

## 2021-03-12 MED ORDER — CLOPIDOGREL BISULFATE 75 MG PO TABS: 75.0000 mg | ORAL_TABLET | Freq: Every day | ORAL | 0 refills | Status: DC

## 2021-03-14 ENCOUNTER — Other Ambulatory Visit (HOSPITAL_COMMUNITY): Payer: Self-pay

## 2021-03-14 ENCOUNTER — Other Ambulatory Visit: Payer: Self-pay

## 2021-03-14 DIAGNOSIS — I10 Essential (primary) hypertension: Secondary | ICD-10-CM

## 2021-03-14 MED ORDER — METOPROLOL SUCCINATE ER 50 MG PO TB24: ORAL_TABLET | ORAL | 0 refills | Status: DC

## 2021-04-08 ENCOUNTER — Other Ambulatory Visit (HOSPITAL_COMMUNITY): Payer: Self-pay

## 2021-04-11 ENCOUNTER — Other Ambulatory Visit: Payer: Self-pay

## 2021-04-11 ENCOUNTER — Telehealth: Payer: Self-pay | Admitting: Family Medicine

## 2021-04-11 DIAGNOSIS — I10 Essential (primary) hypertension: Secondary | ICD-10-CM

## 2021-04-11 MED ORDER — OLMESARTAN MEDOXOMIL 40 MG PO TABS: 40.0000 mg | ORAL_TABLET | Freq: Every day | ORAL | 0 refills | Status: DC

## 2021-04-11 MED ORDER — AMLODIPINE BESYLATE 10 MG PO TABS: ORAL_TABLET | ORAL | 0 refills | Status: DC

## 2021-04-11 NOTE — Telephone Encounter (Signed)
Patient needs his amlodopine and olmesartan  - needs these sent to [facility] on [address]- [place]

## 2021-04-11 NOTE — Telephone Encounter (Signed)
Medications sent to pharmacy

## 2021-04-15 ENCOUNTER — Other Ambulatory Visit (HOSPITAL_COMMUNITY): Payer: Self-pay

## 2021-04-16 ENCOUNTER — Encounter: Payer: Self-pay | Admitting: Internal Medicine

## 2021-04-16 ENCOUNTER — Ambulatory Visit (INDEPENDENT_AMBULATORY_CARE_PROVIDER_SITE_OTHER): Payer: 59 | Admitting: Internal Medicine

## 2021-04-16 ENCOUNTER — Other Ambulatory Visit: Payer: Self-pay

## 2021-04-16 DIAGNOSIS — F3342 Major depressive disorder, recurrent, in full remission: Secondary | ICD-10-CM

## 2021-04-16 DIAGNOSIS — B20 Human immunodeficiency virus [HIV] disease: Secondary | ICD-10-CM | POA: Diagnosis not present

## 2021-04-16 DIAGNOSIS — F17200 Nicotine dependence, unspecified, uncomplicated: Secondary | ICD-10-CM

## 2021-04-16 MED ORDER — BIKTARVY 50-200-25 MG PO TABS: 1.0000 | ORAL_TABLET | Freq: Every day | ORAL | 11 refills | Status: DC

## 2021-04-16 MED ORDER — PREZCOBIX 800-150 MG PO TABS: ORAL_TABLET | ORAL | 11 refills | Status: DC

## 2021-04-16 NOTE — Progress Notes (Signed)
Patient Active Problem List   Diagnosis Date Noted   Chronic diarrhea 09/07/2019    Priority: High   Cerebral aneurysm, nonruptured 09/02/2013    Priority: High   History of CVA (cerebrovascular accident) 06/22/2013    Priority: High   Essential hypertension 05/01/2010    Priority: High   CIGARETTE SMOKER 09/17/2006    Priority: High   Human immunodeficiency virus (HIV) disease ([place]) 09/17/2006    Priority: Medium   Insomnia 10/18/2020   Malignant neoplasm of prostate ([place]) 06/19/2020   Depression 04/17/2020   Ruptured epithelial cyst 06/08/2019   Impacted cerumen of left ear 05/17/2019   B12 deficiency 10/23/2017   Protein-calorie malnutrition ([place]) 08/25/2015   Normocytic anemia 08/24/2015   Routine general medical examination at a health care facility 01/07/2013   CARPAL TUNNEL SYNDROME 01/02/2010   GENITAL HERPES 09/17/2006   ERECTILE DYSFUNCTION 09/17/2006   ABUSE, ALCOHOL, 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   ALFUZOSIN (UROXATRAL) 10 MG 24 HR TABLET    Take 10 mg by mouth daily.   AMLODIPINE (NORVASC) 10 MG TABLET    TAKE 1 TABLET(10 MG) BY MOUTH DAILY   CLOPIDOGREL (PLAVIX) 75 MG TABLET    Take 1 tablet (75 mg total) by mouth daily.   METOPROLOL SUCCINATE (TOPROL-XL) 50 MG 24 HR TABLET    TAKE 1 TABLET(50 MG) BY MOUTH DAILY   MISC. DEVICES (PILL SPLITTER) MISC    Please use to cut trazodone in 1/2.   OLMESARTAN (BENICAR) 40 MG TABLET    Take 1 tablet (40 mg total) by mouth daily.   OMEPRAZOLE (PRILOSEC) 20 MG CAPSULE    Take 20 mg by mouth at bedtime.   POLYETHYL GLYCOL-PROPYL GLYCOL 0.4-0.3 % SOLN    Apply 1 drop to eye 2 (two) times daily.   SILDENAFIL (VIAGRA) 100 MG TABLET    Take 1 tablet (100 mg total) by mouth daily as  needed for erectile dysfunction.   TRAZODONE (DESYREL) 100 MG TABLET    Take 1 tablet (100 mg total) by mouth at bedtime.  Modified Medications   Modified Medication Previous Medication   BICTEGRAVIR-EMTRICITABINE-TENOFOVIR AF (BIKTARVY) 50-200-25 MG TABS TABLET BIKTARVY 50-200-25 MG TABS tablet      Take 1 tablet by mouth daily.    TAKE 1 TABLET BY MOUTH DAILY.   DARUNAVIR-COBICISTAT ([name]) 800-150 MG TABLET [name] 800-150 MG tablet      TAKE 1 TABLET BY MOUTH DAILY. SWALLOW WHOLE. DO NOT CRUSH, BREAK OR CHEW TABLETS. TAKE WITH FOOD.    TAKE 1 TABLET BY MOUTH DAILY. SWALLOW WHOLE. DO NOT CRUSH, BREAK OR CHEW TABLETS. TAKE WITH FOOD.  Discontinued Medications   BICTEGRAVIR-EMTRICITABINE-TENOFOVIR AF (BIKTARVY) 50-200-25 MG TABS TABLET    TAKE 1 TABLET BY MOUTH DAILY.   BICTEGRAVIR-EMTRICITABINE-TENOFOVIR AF (BIKTARVY) 50-200-25 MG TABS TABLET    TAKE 1 TABLET BY MOUTH DAILY.   DARUNAVIR-COBICISTAT ([name]) 800-150 MG TABLET    TAKE 1 TABLET BY MOUTH DAILY. SWALLOW WHOLE. DO NOT CRUSH, BREAK OR CHEW TABLETS. TAKE WITH FOOD.   DARUNAVIR-COBICISTAT ([name]) 800-150 MG TABLET    TAKE 1 TABLET BY MOUTH DAILY. SWALLOW WHOLE. DO NOT CRUSH, BREAK OR CHEW TABLETS. TAKE WITH FOOD.  Subjective: Daniel Gonzalez is in for his routine HIV follow-up visit.  He denies any problems obtaining, taking or tolerating his Biktarvy or [name].  He does not miss doses.  He underwent radioactive seed implantation for prostate cancer earlier this year.  He states that ever since he has had some difficulty starting his urinary stream.  He says that he also has to sit on the toilet to pass his urine because sometimes he will also have a bowel movement at the same time.  He does not have any urinary incontinence or dysuria.  He does not have any diarrhea.  He has been receiving physical therapy for this and says that he is slowly getting better.  His appetite has been poor and he has lost weight this year.  He has  heartburn and takes Prilosec at bedtime but does not feel that it is impacting his weight loss.  He is not having any nausea or vomiting.  He is still smoking cigarettes and has no current plans to try to cut down or quit.  He has cut down on his beer intake 4 to 5 cans a day to 1 can daily.  He has had a COVID booster vaccine.  Review of Systems: Review of Systems  Constitutional:  Positive for weight loss. Negative for fever.  Respiratory:  Negative for cough.   Cardiovascular:  Negative for chest pain.  Gastrointestinal:  Positive for heartburn. Negative for abdominal pain, diarrhea, nausea and vomiting.  Genitourinary:        As noted in HPI.  Psychiatric/Behavioral:  Negative for depression.    Past Medical History:  Diagnosis Date   Cluster headache    hx of   Frequent episodic tension-type headache    GERD (gastroesophageal reflux disease)    HIV infection ([place])    Hypertension    Malignant neoplasm of prostate ([place]) 06/19/2020   Migraine    Pneumonia yrs ago   "walking pneumonia"   Prostate cancer ([place])    Stroke ([place]) yrs ago   no current neurlogist   Wears contact lenses    Wears dentures    upper full lower partial    Social History   Tobacco Use   Smoking status: Every Day    Packs/day: 0.75    Years: 35.00    Pack years: 26.25    Types: Cigarettes   Smokeless tobacco: Never   Tobacco comments:    slowing down  Vaping Use   Vaping Use: Never used  Substance Use Topics   Alcohol use: Yes    Comment: 1 beer per day and few shots of liquor   Drug use: Yes    Frequency: 7.0 times per week    Types: Marijuana    Comment: "few times a week" as of 09-07-2020    Family History  Problem Relation Age of Onset   Arthritis Mother    Heart disease Mother    Hypertension Mother    Diabetes Mother    Arthritis Father    Heart disease Father    Hypertension Father    Cancer Father    Lung cancer Brother    Prostate cancer Brother    Prostate cancer Brother     Colon cancer Neg Hx    Pancreatic cancer Neg Hx    Breast cancer Neg Hx     Allergies  Allergen Reactions   Asa [Aspirin] Palpitations    Speeds up heart rate   Sulfamethoxazole-Trimethoprim Itching    Health Maintenance  Topic Date Due   Zoster Vaccines- Shingrix (1 of 2) Never done   COLONOSCOPY (Pts 45-108yr Insurance coverage will need to be confirmed)  Never done   Pneumococcal Vaccine 080659Years old (3 - PCV) 10/19/2014   COVID-19 Vaccine (3 - Pfizer risk series) 01/24/2020   INFLUENZA VACCINE  03/18/2021   TETANUS/TDAP  12/13/2025   Hepatitis C Screening  Completed   HIV Screening  Completed   HPV VACCINES  Aged Out    Objective:  Vitals:   04/16/21 1443  BP: 117/81  Pulse: 67  Temp: 98.3 F (36.8 C)  TempSrc: Oral  SpO2: 100%  Weight: 108 lb (49 kg)  Height: '5\' 4"'$  (1.626 m)   Body mass index is 18.54 kg/m.  Physical Exam Constitutional:      Comments: He is pleasant and in no distress.  Cardiovascular:     Rate and Rhythm: Normal rate and regular rhythm.     Heart sounds: No murmur heard. Pulmonary:     Effort: Pulmonary effort is normal.     Breath sounds: Normal breath sounds.  Abdominal:     Palpations: Abdomen is soft.     Tenderness: There is no abdominal tenderness.  Psychiatric:        Mood and Affect: Mood normal.    Lab Results Lab Results  Component Value Date   WBC 3.7 (L) 09/06/2020   HGB 13.3 09/06/2020   HCT 37.8 (L) 09/06/2020   MCV 100.5 (H) 09/06/2020   PLT 236 09/06/2020    Lab Results  Component Value Date   CREATININE 0.88 09/06/2020   BUN 13 09/06/2020   NA 136 09/06/2020   K 4.0 09/06/2020   CL 105 09/06/2020   CO2 19 (L) 09/06/2020    Lab Results  Component Value Date   ALT 16 09/06/2020   AST 27 09/06/2020   ALKPHOS 64 09/06/2020   BILITOT 0.4 09/06/2020    Lab Results  Component Value Date   CHOL 190 04/20/2020   HDL 55.90 04/20/2020   LDLCALC 96 04/20/2020   TRIG 187.0 (H) 04/20/2020   CHOLHDL  3 04/20/2020   Lab Results  Component Value Date   LABRPR NON-REACTIVE 10/20/2019   HIV 1 RNA Quant  Date Value  04/17/2020 <20 Copies/mL  10/20/2019 <20 NOT DETECTED copies/mL  05/27/2019 <20 NOT DETECTED copies/mL   CD4 T Cell Abs (/uL)  Date Value  04/17/2020 579  10/20/2019 757  05/27/2019 1,128     Problem List Items Addressed This Visit       High   CIGARETTE SMOKER    He is currently not willing to consider quitting cigarettes.        Medium   Human immunodeficiency virus (HIV) disease ([place]    His infection remains under excellent, long-term control.  He will get repeat lab work today and continue his current antiretroviral regimen.  He will follow-up in 1 year      Relevant Medications   bictegravir-emtricitabine-tenofovir AF (BIKTARVY) 50-200-25 MG TABS tablet   darunavir-cobicistat ([name]) 800-150 MG tablet   Other Relevant Orders   T-helper cell (CD4)- ([facility] clinic only)   HIV-1 RNA quant-no reflex-bld   RPR     Unprioritized   Depression    His depression is in remission.         JMichel Bickers MD [facility] DSy[facility]oreGroup [phone]   [phone] 04/16/2021, 3:11 PM

## 2021-04-16 NOTE — Assessment & Plan Note (Signed)
His depression is in remission. 

## 2021-04-16 NOTE — Assessment & Plan Note (Signed)
He is currently not willing to consider quitting cigarettes.

## 2021-04-16 NOTE — Assessment & Plan Note (Signed)
His infection remains under excellent, long-term control.  He will get repeat lab work today and continue his current antiretroviral regimen.  He will follow-up in 1 year

## 2021-04-17 ENCOUNTER — Other Ambulatory Visit (HOSPITAL_COMMUNITY): Payer: Self-pay

## 2021-04-17 ENCOUNTER — Other Ambulatory Visit: Payer: Self-pay

## 2021-04-17 DIAGNOSIS — B20 Human immunodeficiency virus [HIV] disease: Secondary | ICD-10-CM

## 2021-04-17 LAB — T-HELPER CELL (CD4) - (RCID CLINIC ONLY)
CD4 % Helper T Cell: 33 % (ref 33–65)
CD4 T Cell Abs: 644 /uL (ref 400–1790)

## 2021-04-17 MED ORDER — BIKTARVY 50-200-25 MG PO TABS
1.0000 | ORAL_TABLET | Freq: Every day | ORAL | 11 refills | Status: DC
  Filled 2021-04-17 – 2021-05-13 (×2): qty 30, 30d supply, fill #0
  Filled 2021-06-10: qty 30, 30d supply, fill #1
  Filled 2021-07-08: qty 30, 30d supply, fill #2
  Filled 2021-08-08: qty 30, 30d supply, fill #3
  Filled 2021-09-09: qty 30, 30d supply, fill #4
  Filled 2021-10-03: qty 30, 30d supply, fill #5
  Filled 2021-10-29: qty 30, 30d supply, fill #6
  Filled 2021-11-26: qty 30, 30d supply, fill #7
  Filled 2021-12-30: qty 30, 30d supply, fill #8
  Filled 2022-01-21: qty 30, 30d supply, fill #9
  Filled 2022-02-13: qty 30, 30d supply, fill #10
  Filled 2022-03-17: qty 30, 30d supply, fill #11

## 2021-04-17 MED ORDER — PREZCOBIX 800-150 MG PO TABS
ORAL_TABLET | ORAL | 11 refills | Status: DC
  Filled 2021-04-17: qty 30, fill #0
  Filled 2021-05-13: qty 30, 30d supply, fill #0
  Filled 2021-06-10: qty 30, 30d supply, fill #1
  Filled 2021-07-08: qty 30, 30d supply, fill #2
  Filled 2021-08-08: qty 30, 30d supply, fill #3
  Filled 2021-09-09: qty 30, 30d supply, fill #4
  Filled 2021-10-03: qty 30, 30d supply, fill #5
  Filled 2021-10-29: qty 30, 30d supply, fill #6
  Filled 2021-11-26: qty 30, 30d supply, fill #7
  Filled 2021-12-30: qty 30, 30d supply, fill #8
  Filled 2022-01-21: qty 30, 30d supply, fill #9
  Filled 2022-02-13: qty 30, 30d supply, fill #10
  Filled 2022-03-17: qty 30, 30d supply, fill #11

## 2021-04-17 NOTE — Telephone Encounter (Signed)
Canceled [employer] and [name] at [facility] and sent to [facility] per patient request  Beryle Flock, RN

## 2021-04-18 LAB — RPR: RPR Ser Ql: NONREACTIVE

## 2021-04-18 LAB — HIV-1 RNA QUANT-NO REFLEX-BLD
HIV 1 RNA Quant: NOT DETECTED Copies/mL
HIV-1 RNA Quant, Log: NOT DETECTED Log cps/mL

## 2021-04-29 ENCOUNTER — Telehealth: Payer: Self-pay

## 2021-04-29 MED ORDER — TRAZODONE HCL 100 MG PO TABS: 100.0000 mg | ORAL_TABLET | Freq: Every day | ORAL | 0 refills | Status: DC

## 2021-04-29 NOTE — Telephone Encounter (Signed)
refill 

## 2021-05-13 ENCOUNTER — Other Ambulatory Visit (HOSPITAL_COMMUNITY): Payer: Self-pay

## 2021-05-16 ENCOUNTER — Other Ambulatory Visit (HOSPITAL_COMMUNITY): Payer: Self-pay

## 2021-05-20 ENCOUNTER — Ambulatory Visit: Payer: 59

## 2021-05-24 ENCOUNTER — Ambulatory Visit (INDEPENDENT_AMBULATORY_CARE_PROVIDER_SITE_OTHER): Payer: 59 | Admitting: Family Medicine

## 2021-05-24 ENCOUNTER — Other Ambulatory Visit: Payer: Self-pay

## 2021-05-24 DIAGNOSIS — Z23 Encounter for immunization: Secondary | ICD-10-CM

## 2021-06-10 ENCOUNTER — Other Ambulatory Visit: Payer: Self-pay

## 2021-06-10 ENCOUNTER — Other Ambulatory Visit (HOSPITAL_COMMUNITY): Payer: Self-pay

## 2021-06-10 DIAGNOSIS — Z8673 Personal history of transient ischemic attack (TIA), and cerebral infarction without residual deficits: Secondary | ICD-10-CM

## 2021-06-10 MED ORDER — CLOPIDOGREL BISULFATE 75 MG PO TABS: 75.0000 mg | ORAL_TABLET | Freq: Every day | ORAL | 0 refills | Status: DC

## 2021-06-12 ENCOUNTER — Other Ambulatory Visit (HOSPITAL_COMMUNITY): Payer: Self-pay

## 2021-07-01 ENCOUNTER — Other Ambulatory Visit: Payer: Self-pay

## 2021-07-01 DIAGNOSIS — I10 Essential (primary) hypertension: Secondary | ICD-10-CM

## 2021-07-01 MED ORDER — AMLODIPINE BESYLATE 10 MG PO TABS: ORAL_TABLET | ORAL | 0 refills | Status: DC

## 2021-07-01 MED ORDER — METOPROLOL SUCCINATE ER 50 MG PO TB24: ORAL_TABLET | ORAL | 0 refills | Status: DC

## 2021-07-02 ENCOUNTER — Other Ambulatory Visit: Payer: Self-pay

## 2021-07-02 DIAGNOSIS — I10 Essential (primary) hypertension: Secondary | ICD-10-CM

## 2021-07-02 MED ORDER — OLMESARTAN MEDOXOMIL 40 MG PO TABS: 40.0000 mg | ORAL_TABLET | Freq: Every day | ORAL | 0 refills | Status: DC

## 2021-07-08 ENCOUNTER — Other Ambulatory Visit (HOSPITAL_COMMUNITY): Payer: Self-pay

## 2021-07-09 ENCOUNTER — Other Ambulatory Visit (HOSPITAL_COMMUNITY): Payer: Self-pay

## 2021-07-09 NOTE — Progress Notes (Signed)
Daniel Gonzalez is a 65 y.o. male presents to the office today for flu shot per physician's orders.   [name]

## 2021-07-30 ENCOUNTER — Other Ambulatory Visit: Payer: Self-pay

## 2021-07-30 MED ORDER — TRAZODONE HCL 100 MG PO TABS: 100.0000 mg | ORAL_TABLET | Freq: Every day | ORAL | 0 refills | Status: DC

## 2021-08-08 ENCOUNTER — Other Ambulatory Visit (HOSPITAL_COMMUNITY): Payer: Self-pay

## 2021-09-09 ENCOUNTER — Other Ambulatory Visit (HOSPITAL_COMMUNITY): Payer: Self-pay

## 2021-09-10 ENCOUNTER — Other Ambulatory Visit (HOSPITAL_COMMUNITY): Payer: Self-pay

## 2021-09-10 ENCOUNTER — Other Ambulatory Visit: Payer: Self-pay

## 2021-09-10 DIAGNOSIS — Z8673 Personal history of transient ischemic attack (TIA), and cerebral infarction without residual deficits: Secondary | ICD-10-CM

## 2021-09-10 MED ORDER — CLOPIDOGREL BISULFATE 75 MG PO TABS
75.0000 mg | ORAL_TABLET | Freq: Every day | ORAL | 0 refills | Status: DC
Start: 2021-09-10 — End: 2021-12-10

## 2021-09-30 ENCOUNTER — Other Ambulatory Visit: Payer: Self-pay

## 2021-09-30 DIAGNOSIS — I10 Essential (primary) hypertension: Secondary | ICD-10-CM

## 2021-09-30 MED ORDER — OLMESARTAN MEDOXOMIL 40 MG PO TABS: 40.0000 mg | ORAL_TABLET | Freq: Every day | ORAL | 0 refills | Status: DC

## 2021-09-30 MED ORDER — METOPROLOL SUCCINATE ER 50 MG PO TB24: ORAL_TABLET | ORAL | 0 refills | Status: DC

## 2021-09-30 MED ORDER — AMLODIPINE BESYLATE 10 MG PO TABS: ORAL_TABLET | ORAL | 0 refills | Status: DC

## 2021-10-03 ENCOUNTER — Other Ambulatory Visit (HOSPITAL_COMMUNITY): Payer: Self-pay

## 2021-10-07 ENCOUNTER — Other Ambulatory Visit (HOSPITAL_COMMUNITY): Payer: Self-pay

## 2021-10-28 ENCOUNTER — Telehealth: Payer: Self-pay | Admitting: Family Medicine

## 2021-10-28 NOTE — Telephone Encounter (Signed)
Pt called in asking for a refill on the trazodone, pt uses [facility] on [address] ?

## 2021-10-29 ENCOUNTER — Other Ambulatory Visit (HOSPITAL_COMMUNITY): Payer: Self-pay

## 2021-10-29 ENCOUNTER — Other Ambulatory Visit: Payer: Self-pay

## 2021-10-29 MED ORDER — TRAZODONE HCL 100 MG PO TABS
100.0000 mg | ORAL_TABLET | Freq: Every day | ORAL | 0 refills | Status: DC
Start: 2021-10-29 — End: 2021-10-30

## 2021-10-29 NOTE — Telephone Encounter (Signed)
I have LM for pt to call back to schedule an appt  ?

## 2021-10-30 ENCOUNTER — Encounter: Payer: Self-pay | Admitting: Family Medicine

## 2021-10-30 ENCOUNTER — Ambulatory Visit (INDEPENDENT_AMBULATORY_CARE_PROVIDER_SITE_OTHER): Payer: Managed Care, Other (non HMO) | Admitting: Family Medicine

## 2021-10-30 VITALS — BP 112/62 | HR 52 | Temp 97.8°F | Resp 16 | Wt 105.6 lb

## 2021-10-30 DIAGNOSIS — E440 Moderate protein-calorie malnutrition: Secondary | ICD-10-CM

## 2021-10-30 DIAGNOSIS — I10 Essential (primary) hypertension: Secondary | ICD-10-CM

## 2021-10-30 DIAGNOSIS — C61 Malignant neoplasm of prostate: Secondary | ICD-10-CM

## 2021-10-30 DIAGNOSIS — R1013 Epigastric pain: Secondary | ICD-10-CM

## 2021-10-30 DIAGNOSIS — B20 Human immunodeficiency virus [HIV] disease: Secondary | ICD-10-CM

## 2021-10-30 DIAGNOSIS — G4700 Insomnia, unspecified: Secondary | ICD-10-CM

## 2021-10-30 LAB — CBC WITH DIFFERENTIAL/PLATELET
Basophils Absolute: 0 10*3/uL (ref 0.0–0.1)
Basophils Relative: 0.7 % (ref 0.0–3.0)
Eosinophils Absolute: 0 10*3/uL (ref 0.0–0.7)
Eosinophils Relative: 0.9 % (ref 0.0–5.0)
HCT: 33.2 % — ABNORMAL LOW (ref 39.0–52.0)
Hemoglobin: 11.3 g/dL — ABNORMAL LOW (ref 13.0–17.0)
Lymphocytes Relative: 38 % (ref 12.0–46.0)
Lymphs Abs: 1.6 10*3/uL (ref 0.7–4.0)
MCHC: 33.9 g/dL (ref 30.0–36.0)
MCV: 106.1 fl — ABNORMAL HIGH (ref 78.0–100.0)
Monocytes Absolute: 0.5 10*3/uL (ref 0.1–1.0)
Monocytes Relative: 11.2 % (ref 3.0–12.0)
Neutro Abs: 2.1 10*3/uL (ref 1.4–7.7)
Neutrophils Relative %: 49.2 % (ref 43.0–77.0)
Platelets: 175 10*3/uL (ref 150.0–400.0)
RBC: 3.13 Mil/uL — ABNORMAL LOW (ref 4.22–5.81)
RDW: 13.6 % (ref 11.5–15.5)
WBC: 4.3 10*3/uL (ref 4.0–10.5)

## 2021-10-30 LAB — BASIC METABOLIC PANEL
BUN: 7 mg/dL (ref 6–23)
CO2: 25 mEq/L (ref 19–32)
Calcium: 9 mg/dL (ref 8.4–10.5)
Chloride: 106 mEq/L (ref 96–112)
Creatinine, Ser: 0.87 mg/dL (ref 0.40–1.50)
GFR: 91.87 mL/min (ref >60.00)
Glucose, Bld: 89 mg/dL (ref 70–99)
Potassium: 3.6 mEq/L (ref 3.5–5.1)
Sodium: 138 mEq/L (ref 135–145)

## 2021-10-30 LAB — LIPID PANEL
Cholesterol: 138 mg/dL (ref 0–200)
HDL: 62.4 mg/dL (ref >39.00)
LDL Cholesterol: 53 mg/dL (ref 0–99)
NonHDL: 75.59
Total CHOL/HDL Ratio: 2
Triglycerides: 114 mg/dL (ref 0.0–149.0)
VLDL: 22.8 mg/dL (ref 0.0–40.0)

## 2021-10-30 LAB — HEPATIC FUNCTION PANEL
ALT: 10 U/L (ref 0–53)
AST: 20 U/L (ref 0–37)
Albumin: 4 g/dL (ref 3.5–5.2)
Alkaline Phosphatase: 51 U/L (ref 39–117)
Bilirubin, Direct: 0.1 mg/dL (ref 0.0–0.3)
Total Bilirubin: 0.4 mg/dL (ref 0.2–1.2)
Total Protein: 6.5 g/dL (ref 6.0–8.3)

## 2021-10-30 LAB — TSH: TSH: 1.59 u[IU]/mL (ref 0.35–5.50)

## 2021-10-30 MED ORDER — OMEPRAZOLE 40 MG PO CPDR: 40.0000 mg | DELAYED_RELEASE_CAPSULE | Freq: Every day | ORAL | 1 refills | Status: DC

## 2021-10-30 MED ORDER — TRAZODONE HCL 100 MG PO TABS: 100.0000 mg | ORAL_TABLET | Freq: Every day | ORAL | 0 refills | Status: DC

## 2021-10-30 MED ORDER — SUCRALFATE 1 G PO TABS: 1.0000 g | ORAL_TABLET | Freq: Three times a day (TID) | ORAL | 0 refills | Status: DC

## 2021-10-30 NOTE — Assessment & Plan Note (Signed)
Trazodone refill sent ?

## 2021-10-30 NOTE — Assessment & Plan Note (Signed)
Following w/ Dr Megan Salon ?

## 2021-10-30 NOTE — Progress Notes (Signed)
? ?  Subjective:  ? ? Patient ID: Daniel Gonzalez, male    DOB: 1955-08-23, 66 y.o.   MRN: [id number] ? ?HPI ?HTN- chronic problem, on Amlodipine '10mg'$  daily, Metoprolol XL '50mg'$  daily, Olmesartan '40mg'$  daily w/ good control.  No CP, SOB, HAs, visual changes, edema. ? ?HIV- following w/ Dr Megan Salon ? ?Prostate Cancer- following w/ Urology ? ?Protein calorie malnutrition- pt has lost 10 lbs since his last in-person visit in Sept 2021.  Pt reports he wakes 'every morning with a stomach ache' since having radiation.  States he's not able to eat anything until almost noon.  Pt reports he's 'not able to eat every day'.  Has both pain and nausea.  + bloating- 'if I pass gas, I feel better'.  Denies GERD.  Pt is taking OTC Prilosec daily.   Pt reports he is 'trying to make my water intake more than my beer intake'.  States he is drinking 1 beer daily.  Says he is lactose intolerant so can't drink protein shakes.   ? ? ?Review of Systems ?For ROS see HPI  ? ?This visit occurred during the SARS-CoV-2 public health emergency.  Safety protocols were in place, including screening questions prior to the visit, additional usage of staff PPE, and extensive cleaning of exam room while observing appropriate contact time as indicated for disinfecting solutions.   ?   ?Objective:  ? Physical Exam ?Vitals reviewed.  ?Constitutional:   ?   General: He is not in acute distress. ?   Appearance: He is well-developed. He is ill-appearing (cachectic).  ?HENT:  ?   Head: Normocephalic and atraumatic.  ?Eyes:  ?   Extraocular Movements: Extraocular movements intact.  ?   Conjunctiva/sclera: Conjunctivae normal.  ?   Pupils: Pupils are equal, round, and reactive to light.  ?Neck:  ?   Thyroid: No thyromegaly.  ?Cardiovascular:  ?   Rate and Rhythm: Normal rate and regular rhythm.  ?   Pulses: Normal pulses.  ?   Heart sounds: Normal heart sounds. No murmur heard. ?Pulmonary:  ?   Effort: Pulmonary effort is normal. No respiratory distress.  ?    Breath sounds: Normal breath sounds.  ?Abdominal:  ?   General: Bowel sounds are normal. There is no distension.  ?   Palpations: Abdomen is soft. There is no mass.  ?   Tenderness: There is no abdominal tenderness. There is no guarding.  ?Musculoskeletal:  ?   Cervical back: Normal range of motion and neck supple.  ?   Right lower leg: No edema.  ?   Left lower leg: No edema.  ?Lymphadenopathy:  ?   Cervical: No cervical adenopathy.  ?Skin: ?   General: Skin is warm and dry.  ?Neurological:  ?   General: No focal deficit present.  ?   Mental Status: He is alert and oriented to person, place, and time.  ?   Cranial Nerves: No cranial nerve deficit.  ?Psychiatric:     ?   Mood and Affect: Mood normal.     ?   Behavior: Behavior normal.  ? ? ? ? ? ?   ?Assessment & Plan:  ? ? ?

## 2021-10-30 NOTE — Assessment & Plan Note (Signed)
Chronic problem.  Well controlled on Amlodipine, Metoprolol, and Olmesartan.  Check labs due to ARB but no anticipated med changes. ?

## 2021-10-30 NOTE — Assessment & Plan Note (Signed)
Deteriorated.  Pt has lost 10lbs since last in person visit in Sept 2021.  He feels like he hasn't been able to eat since his radiation tx for prostate cancer.  Reports pain and nausea which prohibits him from eating.  Feels pain worsens after eating.  Is lactose intolerant so cannot drink protein shakes.  Will try and tx epigastric pain w/ increased dose of PPI and addition of carafate to see if pt can tolerate eating.  If not, will need GI referral.  Pt expressed understanding and is in agreement w/ plan.  ?

## 2021-10-30 NOTE — Patient Instructions (Signed)
Follow up in 3-4 weeks to recheck abd pain ?We'll notify you of your lab results and make any changes if needed ?START the Sucralfate before each meal and before bed ?TAKE the new prescription strength Omeprazole to decrease acid production ?TRY and eat regularly throughout the day- even if it's small amounts ?Call with any questions or concerns ?Hang in there!!! ?

## 2021-10-30 NOTE — Assessment & Plan Note (Signed)
Following w/ Urology ?

## 2021-10-31 LAB — PREALBUMIN: Prealbumin: 26 mg/dL (ref 21–43)

## 2021-11-05 ENCOUNTER — Telehealth: Payer: Self-pay

## 2021-11-05 ENCOUNTER — Other Ambulatory Visit (HOSPITAL_COMMUNITY): Payer: Self-pay

## 2021-11-05 NOTE — Telephone Encounter (Signed)
-----   Message from Midge Minium, MD sent at 10/31/2021  3:46 PM EDT ----- ?Labs look good w/ exception of mildly low hemoglobin (blood count).  Please start an OTC multivitamin that contains B12 and Folate to try and improve this number ?

## 2021-11-05 NOTE — Telephone Encounter (Signed)
Patient aware of labs.  

## 2021-11-12 ENCOUNTER — Other Ambulatory Visit: Payer: Self-pay

## 2021-11-12 ENCOUNTER — Encounter: Payer: Self-pay | Admitting: Family Medicine

## 2021-11-12 ENCOUNTER — Encounter: Payer: Self-pay | Admitting: Nurse Practitioner

## 2021-11-12 ENCOUNTER — Ambulatory Visit (INDEPENDENT_AMBULATORY_CARE_PROVIDER_SITE_OTHER): Payer: Managed Care, Other (non HMO) | Admitting: Family Medicine

## 2021-11-12 VITALS — BP 110/60 | HR 54 | Temp 98.0°F | Resp 16 | Wt 104.4 lb

## 2021-11-12 DIAGNOSIS — R6881 Early satiety: Secondary | ICD-10-CM | POA: Diagnosis not present

## 2021-11-12 DIAGNOSIS — R1013 Epigastric pain: Secondary | ICD-10-CM

## 2021-11-12 DIAGNOSIS — R634 Abnormal weight loss: Secondary | ICD-10-CM

## 2021-11-12 MED ORDER — FAMOTIDINE 40 MG PO TABS: 40.0000 mg | ORAL_TABLET | Freq: Every day | ORAL | 1 refills | Status: DC

## 2021-11-12 NOTE — Progress Notes (Signed)
? ?  Subjective:  ? ? Patient ID: Daniel Gonzalez, male    DOB: 1955-10-29, 66 y.o.   MRN: [id number] ? ?HPI ?Epigastric pain- at last visit his Omeprazole was increased to '40mg'$  daily and he was started on Carafate.  Pharmacy did not fill PPI prescription due to interaction w/ Plavix.  Pt reports pain is worse in the morning and then improves as the day goes on.  Pt reports sxs improve after passing gas and using the restroom but not able to eat in the morning.  Pt reports he is still struggling to eat throughout the day.  Pt reports sxs started after his prostate surgery over a year ago.  Pt is not able to lie on abdomen when sleeping. ? ? ?Review of Systems ?For ROS see HPI  ?   ?Objective:  ? Physical Exam ?Vitals reviewed.  ?Constitutional:   ?   Appearance: He is ill-appearing (cachectic).  ?HENT:  ?   Head: Normocephalic and atraumatic.  ?   Mouth/Throat:  ?   Mouth: Mucous membranes are moist.  ?   Pharynx: Oropharynx is clear.  ?Eyes:  ?   Extraocular Movements: Extraocular movements intact.  ?Cardiovascular:  ?   Rate and Rhythm: Normal rate and regular rhythm.  ?Pulmonary:  ?   Effort: Pulmonary effort is normal. No respiratory distress.  ?   Breath sounds: Normal breath sounds. No wheezing or rhonchi.  ?Abdominal:  ?   General: Abdomen is flat. Bowel sounds are normal. There is no distension.  ?   Palpations: Abdomen is soft.  ?   Tenderness: There is abdominal tenderness in the epigastric area. There is no guarding or rebound.  ?Skin: ?   General: Skin is warm and dry.  ?Neurological:  ?   General: No focal deficit present.  ?   Mental Status: He is alert and oriented to person, place, and time.  ?Psychiatric:     ?   Mood and Affect: Mood normal.     ?   Behavior: Behavior normal.  ? ? ? ? ? ?   ?Assessment & Plan:  ? ?Epigastric pain- ongoing issue.  Unfortunately the pharmacy did not fill his PPI due to the interaction w/ plavix.  We were not notified of this and pt has been w/o any type of acid  suppression.  Will start Famotidine in addition to the Carafate and refer to GI. ? ?Early Satiety- new.  Pt reports he is unable to eat in the AM due to stomach upset and then during the day, he will take a few bites and then be full.  Unclear if this is due to gas and bloating, GERD, or other more sinister process such as a mass.  Encouraged him to eat calorically dense foods frequently throughout the day.  Again, will refer to GI. ? ?Unintentional weight loss- ongoing issue for pt.  At this point, he is cachectic.  BMI 17.92.  He states he cannot tolerate Boost or Ensure.  Encouraged high calorie foods frequently throughout the day.  Will follow. ?

## 2021-11-12 NOTE — Patient Instructions (Addendum)
We will call you with your GI appt to figure out this pain and eating ?Try and make sure you are eating throughout the day- small amounts but very often ?Continue the Sucralfate prior to eating ?START the Famotidine daily ?Call with any questions or concerns ?Hang in there!! ?

## 2021-11-26 ENCOUNTER — Other Ambulatory Visit (HOSPITAL_COMMUNITY): Payer: Self-pay

## 2021-11-27 ENCOUNTER — Other Ambulatory Visit (INDEPENDENT_AMBULATORY_CARE_PROVIDER_SITE_OTHER): Payer: Managed Care, Other (non HMO)

## 2021-11-27 ENCOUNTER — Ambulatory Visit (INDEPENDENT_AMBULATORY_CARE_PROVIDER_SITE_OTHER): Payer: Managed Care, Other (non HMO) | Admitting: Nurse Practitioner

## 2021-11-27 ENCOUNTER — Telehealth: Payer: Self-pay

## 2021-11-27 ENCOUNTER — Encounter: Payer: Self-pay | Admitting: Nurse Practitioner

## 2021-11-27 VITALS — BP 102/78 | HR 65 | Ht 64.0 in | Wt 102.2 lb

## 2021-11-27 DIAGNOSIS — D539 Nutritional anemia, unspecified: Secondary | ICD-10-CM | POA: Diagnosis not present

## 2021-11-27 DIAGNOSIS — R634 Abnormal weight loss: Secondary | ICD-10-CM | POA: Diagnosis not present

## 2021-11-27 DIAGNOSIS — R1010 Upper abdominal pain, unspecified: Secondary | ICD-10-CM

## 2021-11-27 DIAGNOSIS — R110 Nausea: Secondary | ICD-10-CM | POA: Diagnosis not present

## 2021-11-27 LAB — PROTIME-INR
INR: 1 ratio (ref 0.8–1.0)
Prothrombin Time: 11 s (ref 9.6–13.1)

## 2021-11-27 LAB — B12 AND FOLATE PANEL
Folate: 8.6 ng/mL (ref >5.9)
Vitamin B-12: 207 pg/mL — ABNORMAL LOW (ref 211–911)

## 2021-11-27 LAB — LIPASE: Lipase: 75 U/L — ABNORMAL HIGH (ref 11.0–59.0)

## 2021-11-27 LAB — FERRITIN: Ferritin: 1121.6 ng/mL — ABNORMAL HIGH (ref 22.0–322.0)

## 2021-11-27 MED ORDER — NA SULFATE-K SULFATE-MG SULF 17.5-3.13-1.6 GM/177ML PO SOLN: 1.0000 | ORAL | 0 refills | Status: DC

## 2021-11-27 NOTE — Progress Notes (Signed)
? ? ?Assessment  ? ?Patient profile:  ?Daniel Gonzalez is a 66 y.o. male  Past medical history significant for prostate cancer s/p radiation, HIV, CVA , HTN . See PMH below for any additional history ? ?Nausea, generalized upper abdominal pain, nausea and unintentional weight loss ( 16 pounds since Feb 2022 but overall stable now).  Rule out PUD. Rule out pancreatic source given Etoh use?  Biliary source?  ? ?Chronic macrocytic anemia, possible related to Etoh / vitamin deficiency / bone marrow suppression. Has a remote history of folate deficiency. Rule out component of iron deficiency.  ? ?History of heavy Etoh use in past,now drinks in moderation is sounds like. Platelets 175. Liver chemistries normal including normal albumin ? ?Chronic GERD. Occasional breakthrough heartburn on daily Pepcid ? ?Colon cancer screening. Last colonoscopy apparently in 2009 ? ?Chronic plavix use. Hold Plavix for 5 days before procedure - will instruct when and how to resume after procedure. Patient understands that there is a low but real risk of cardiovascular event such as heart attack, stroke, or embolism /  thrombosis, or ischemia while off Plavix. The patient consents to proceed. Will communicate by phone or EMR with patient's prescribing provider to confirm that holding Plavix is reasonable in this case.  ? ?HIV, on anti-virals. Followed by Dr. Megan Salon ? ?Plan  ? ?Obtain lipase ?Repeat CBC, check iron studies, B12 and folate ?INR ?Schedule for EGD to evaluate weight loss, upper abdominal pain, and nausea. The risks and benefits of EGD with possible biopsies were discussed with the patient who agrees to proceed.  ?Can discontinue Carafate, took for a month without improvement ?Schedule for screening colonoscopy.  The risks and benefits of colonoscopy with possible polypectomy / biopsies were discussed and the patient agrees to proceed.  ?If EGD / colonoscopy negative then consider abdominal imaging.  ? ? ?History of Present  Illness  ? ?Chief complaint: upper abdominal pain ? ?Referred by PCP for epigastric pain, early satiety and weight loss. He was last seen here in 2014 for CRC screening. Plan was to get 2009 colonoscopy report and if negative then plan for repeat exam in 2019 with annual FOBT in interim.  ? ?Interval history: ?[name] gives a history of generalized upper abdominal pain ( different areas) over the last several months. Pain doesn't radiate through to this back. Pain seems to get better with food. He is often nauseated in am but doesn't vomit. Passage of flatus or having BM  helps the pain. He doesn't have any blood in stool. He has occasional constipation managed with laxatives as needed. He has lost about 20 pounds unintentionally over last 6 months. He drinks a beer a day and later in day drinks a few sips of liquor. He used to drink heavily ( for years). but not in the last 5 years.  ? ?PCP gave him Carafate which he took for a month but doesn't really help.He has taken Pepcid at night for GERD for years. Still gets occasional breakthrough heart burn.  ? ?Labs with PCP in mid march showed normal LFTs.  His WBC was normal. Hgb was low at 11.3, down from 13.3 in January 2022. TSH normal  ? ?[name] hasn't seen any blood in his stool. No black stool.   ? ?Previous Labs / Imaging:: ? ?  Latest Ref Rng & Units 10/30/2021  ? 11:29 AM 09/06/2020  ? 10:06 AM 04/20/2020  ? 12:59 PM  ?CBC  ?WBC 4.0 - 10.5 K/uL 4.3  3.7   6.5    ?Hemoglobin 13.0 - 17.0 g/dL 11.3   13.3   12.9    ?Hematocrit 39.0 - 52.0 % 33.2   37.8   38.0    ?Platelets 150.0 - 400.0 K/uL 175.0   236   186.0    ? ? ?No results found for: LIPASE ? ?  Latest Ref Rng & Units 10/30/2021  ? 11:29 AM 09/06/2020  ? 10:06 AM 04/20/2020  ? 12:59 PM  ?CMP  ?Glucose 70 - 99 mg/dL 89   96   102    ?BUN 6 - 23 mg/dL '7   13   13    '$ ?Creatinine 0.40 - 1.50 mg/dL 0.87   0.88   1.05    ?Sodium 135 - 145 mEq/L 138   136   136    ?Potassium 3.5 - 5.1 mEq/L 3.6   4.0   3.9     ?Chloride 96 - 112 mEq/L 106   105   103    ?CO2 19 - 32 mEq/L '25   19   23    '$ ?Calcium 8.4 - 10.5 mg/dL 9.0   9.5   9.8    ?Total Protein 6.0 - 8.3 g/dL 6.5   8.0   7.5    ?Total Bilirubin 0.2 - 1.2 mg/dL 0.4   0.4   0.4    ?Alkaline Phos 39 - 117 U/L 51   64   55    ?AST 0 - 37 U/L '20   27   16    '$ ?ALT 0 - 53 U/L '10   16   7    '$ ? ? ? ?Past Medical History:  ?Diagnosis Date  ? Cluster headache   ? hx of  ? Frequent episodic tension-type headache   ? GERD (gastroesophageal reflux disease)   ? HIV infection ([place])   ? Hypertension   ? Malignant neoplasm of prostate ([place]) 06/19/2020  ? Migraine   ? Pneumonia yrs ago  ? "walking pneumonia"  ? Prostate cancer ([place])   ? Stroke [facility]) yrs ago  ? no current neurlogist  ? Wears contact lenses   ? Wears dentures   ? upper full lower partial  ? ?Past Surgical History:  ?Procedure Laterality Date  ? COLONOSCOPY    ? cyst in back    ? removed from back  ? PROSTATE BIOPSY  2021  ? RADIOACTIVE SEED IMPLANT N/A 09/10/2020  ? Procedure: RADIOACTIVE SEED IMPLANT/BRACHYTHERAPY IMPLANT;  Surgeon: Lucas Mallow, MD;  Location[facility];  Service: Urology;  Laterality: N/A;  ? SPACE OAR INSTILLATION N/A 09/10/2020  ? Procedure: SPACE OAR INSTILLATION;  Surgeon: Lucas Mallow, MD;  Location: [facility];  Service: Urology;  Laterality: N/A;  ? TEE WITHOUT CARDIOVERSION N/A 06/24/2013  ? Procedure: TRANSESOPHAGEAL ECHOCARDIOGRAM (TEE);  Surgeon: Jolaine Artist, MD;  Location: [facility] ENDOSCOPY;  Service: Cardiovascular;  Laterality: N/A;  ? ?Family History  ?Problem Relation Age of Onset  ? Arthritis Mother   ? Heart disease Mother   ? Hypertension Mother   ? Diabetes Mother   ? Arthritis Father   ? Heart disease Father   ? Hypertension Father   ? Cancer Father   ? Lung cancer Brother   ? Prostate cancer Brother   ? Prostate cancer Brother   ? Colon cancer Neg Hx   ? Pancreatic cancer Neg Hx   ? Breast cancer Neg Hx   ?  Esophageal cancer Neg Hx   ?  Rectal cancer Neg Hx   ? Ovarian cancer Neg Hx   ? Stomach cancer Neg Hx   ? ?Social History  ? ?Tobacco Use  ? Smoking status: Every Day  ?  Packs/day: 0.75  ?  Years: 35.00  ?  Pack years: 26.25  ?  Types: Cigarettes  ? Smokeless tobacco: Never  ? Tobacco comments:  ?  slowing down  ?Vaping Use  ? Vaping Use: Never used  ?Substance Use Topics  ? Alcohol use: Yes  ?  Comment: 1 beer per day and few shots of liquor  ? Drug use: Yes  ?  Frequency: 7.0 times per week  ?  Types: Marijuana  ?  Comment: "few times a week" as of 09-07-2020  ? ?Current Outpatient Medications  ?Medication Sig Dispense Refill  ? acetaminophen (TYLENOL) 650 MG CR tablet Take 1,300 mg by mouth every 8 (eight) hours as needed for pain.    ? alfuzosin (UROXATRAL) 10 MG 24 hr tablet Take 10 mg by mouth daily.    ? amLODipine (NORVASC) 10 MG tablet TAKE 1 TABLET(10 MG) BY MOUTH DAILY 90 tablet 0  ? bictegravir-emtricitabine-tenofovir AF (BIKTARVY) 50-200-25 MG TABS tablet Take 1 tablet by mouth daily. 30 tablet 11  ? clopidogrel (PLAVIX) 75 MG tablet Take 1 tablet (75 mg total) by mouth daily. 90 tablet 0  ? darunavir-cobicistat ([name]) 800-150 MG tablet TAKE 1 TABLET BY MOUTH DAILY. SWALLOW WHOLE. DO NOT CRUSH, BREAK OR CHEW TABLETS. TAKE WITH FOOD. 30 tablet 11  ? famotidine (PEPCID) 40 MG tablet Take 1 tablet (40 mg total) by mouth daily. 90 tablet 1  ? metoprolol succinate (TOPROL-XL) 50 MG 24 hr tablet TAKE 1 TABLET(50 MG) BY MOUTH DAILY 90 tablet 0  ? Misc. Devices (PILL SPLITTER) MISC Please use to cut trazodone in 1/2. 1 each 0  ? olmesartan (BENICAR) 40 MG tablet Take 1 tablet (40 mg total) by mouth daily. 90 tablet 0  ? Polyethyl Glycol-Propyl Glycol 0.4-0.3 % SOLN Apply 1 drop to eye 2 (two) times daily.    ? sildenafil (VIAGRA) 100 MG tablet Take 1 tablet (100 mg total) by mouth daily as needed for erectile dysfunction. 90 tablet 0  ? sucralfate (CARAFATE) 1 g tablet Take 1 tablet (1 g total) by mouth 4 (four) times daily -  with  meals and at bedtime. 90 tablet 0  ? traZODone (DESYREL) 100 MG tablet Take 1 tablet (100 mg total) by mouth at bedtime. 90 tablet 0  ? ?No current facility-administered medications for this visit.  ? ? ?

## 2021-11-27 NOTE — Telephone Encounter (Signed)
Request for surgical clearance:     Endoscopy Procedure ? ?What type of surgery is being performed?     Colonoscopy/EGD ? ?When is this surgery scheduled?     01/07/22 ? ?What type of clearance is required ?   Pharmacy ? ?Are there any medications that need to be held prior to surgery and how long? Plavix 5 day hold ? ?Practice name and name of physician performing surgery?      [facility] ? ?What is your office phone and fax number?      Phone- [phone]  Fax- [phone] ? ?Anesthesia type (None, local, MAC, general) ?       MAC  ?

## 2021-11-27 NOTE — Patient Instructions (Signed)
You have been scheduled for an EGD and Colonoscopy. Please follow the written instructions given to you at your visit today. ?Please pick up your prep supplies at the pharmacy within the next 1-3 days. ?If you use inhalers (even only as needed), please bring them with you on the day of your procedure. ? ?Please proceed to the basement level for lab work before leaving today. Press "B" on the elevator. The lab is located at the first door on the left as you exit the elevator. ? ?HEALTHCARE LAWS AND MY CHART RESULTS:  ? ?Due to recent changes in healthcare laws, you may see results of your imaging and/or laboratory studies on MyChart before I have had a chance to review them.  I understand that in some cases there may be results that are confusing or concerning to you. Please understand that not all results are received at the same time and often I may need to interpret multiple results in order to provide you with the best plan of care or course of treatment. Therefore, I ask that you please give me 48 hours to thoroughly review all your results before contacting my office for clarification.  ? ?You will be contacted by our office prior to your procedure for directions on holding your Plavix.  If you do not hear from our office 1 week prior to your scheduled procedure, please call [phone] to discuss.  ?Stop Carafate. ? ?Thank you for trusting me with your gastrointestinal care!   ? ?Tye Savoy, NP ? ? ?BMI: ? ?If you are age 64 or older, your body mass index should be between 23-30. Your Body mass index is 17.55 kg/m?Marland Kitchen If this is out of the aforementioned range listed, please consider follow up with your Primary Care Provider. ? ?If you are age 59 or younger, your body mass index should be between 19-25. Your Body mass index is 17.55 kg/m?Marland Kitchen If this is out of the aformentioned range listed, please consider follow up with your Primary Care Provider.  ? ?MY CHART: ? ?The [facility] providers would like to  encourage you to use [facility] to communicate with providers for non-urgent requests or questions.  Due to long hold times on the telephone, sending your provider a message by [facility] may be a faster and more efficient way to get a response.  Please allow 48 business hours for a response.  Please remember that this is for non-urgent requests.  ? ? ?

## 2021-11-28 LAB — IRON AND TIBC
Iron Saturation: 64 % — ABNORMAL HIGH (ref 15–55)
Iron: 142 ug/dL (ref 38–169)
Total Iron Binding Capacity: 222 ug/dL — ABNORMAL LOW (ref 250–450)
UIBC: 80 ug/dL — ABNORMAL LOW (ref 111–343)

## 2021-11-29 ENCOUNTER — Other Ambulatory Visit: Payer: Self-pay

## 2021-11-29 DIAGNOSIS — R1010 Upper abdominal pain, unspecified: Secondary | ICD-10-CM

## 2021-11-29 DIAGNOSIS — R634 Abnormal weight loss: Secondary | ICD-10-CM

## 2021-11-29 DIAGNOSIS — D539 Nutritional anemia, unspecified: Secondary | ICD-10-CM

## 2021-11-29 DIAGNOSIS — R748 Abnormal levels of other serum enzymes: Secondary | ICD-10-CM

## 2021-12-01 NOTE — Telephone Encounter (Signed)
Ok to hold Plavix x5 days as requested ?

## 2021-12-05 ENCOUNTER — Other Ambulatory Visit (HOSPITAL_COMMUNITY): Payer: Self-pay

## 2021-12-06 NOTE — Progress Notes (Signed)
Addendum: Reviewed and agree with assessment and management plan. Talya Quain M, MD  

## 2021-12-10 ENCOUNTER — Telehealth: Payer: Self-pay

## 2021-12-10 ENCOUNTER — Other Ambulatory Visit: Payer: Self-pay

## 2021-12-10 DIAGNOSIS — Z8673 Personal history of transient ischemic attack (TIA), and cerebral infarction without residual deficits: Secondary | ICD-10-CM

## 2021-12-10 MED ORDER — CLOPIDOGREL BISULFATE 75 MG PO TABS: 75.0000 mg | ORAL_TABLET | Freq: Every day | ORAL | 0 refills | Status: DC

## 2021-12-10 NOTE — Telephone Encounter (Signed)
Rx sent 

## 2021-12-10 NOTE — Telephone Encounter (Signed)
MEDICATION:clopidogrel (PLAVIX) 75 MG tablet ? ?PHARMACY:[facility] [id number] - [place], [place] [address] ? ?Comments: Patient is completely out.  ? ?**Let patient know to contact pharmacy at the end of the day to make sure medication is ready. ** ? ?** Please notify patient to allow 48-72 hours to process** ? ?**Encourage patient to contact the pharmacy for refills or they can request refills through [facility]** ?  ?

## 2021-12-16 ENCOUNTER — Other Ambulatory Visit: Payer: Self-pay

## 2021-12-16 MED ORDER — VITAMIN B-12 1000 MCG PO TABS: 1000.0000 ug | ORAL_TABLET | Freq: Every day | ORAL | Status: AC

## 2021-12-16 NOTE — Telephone Encounter (Signed)
LM for patient to call back.

## 2021-12-20 NOTE — Telephone Encounter (Signed)
Left message for patient to return call.

## 2021-12-24 ENCOUNTER — Other Ambulatory Visit (INDEPENDENT_AMBULATORY_CARE_PROVIDER_SITE_OTHER): Payer: Commercial Managed Care - HMO

## 2021-12-24 DIAGNOSIS — D539 Nutritional anemia, unspecified: Secondary | ICD-10-CM

## 2021-12-24 DIAGNOSIS — R634 Abnormal weight loss: Secondary | ICD-10-CM

## 2021-12-24 DIAGNOSIS — R1010 Upper abdominal pain, unspecified: Secondary | ICD-10-CM | POA: Diagnosis not present

## 2021-12-24 DIAGNOSIS — R748 Abnormal levels of other serum enzymes: Secondary | ICD-10-CM

## 2021-12-24 LAB — FOLATE: Folate: 11.1 ng/mL (ref >5.9)

## 2021-12-24 LAB — LIPASE: Lipase: 56 U/L (ref 11.0–59.0)

## 2021-12-25 NOTE — Telephone Encounter (Signed)
Left another message for patient regarding his plavix. Advised on his identified VM that if he does not return our call his procedure will be canceled. ?

## 2021-12-27 ENCOUNTER — Other Ambulatory Visit: Payer: Self-pay

## 2021-12-27 DIAGNOSIS — I10 Essential (primary) hypertension: Secondary | ICD-10-CM

## 2021-12-27 MED ORDER — METOPROLOL SUCCINATE ER 50 MG PO TB24: 50.0000 mg | ORAL_TABLET | Freq: Every day | ORAL | 3 refills | Status: DC

## 2021-12-30 ENCOUNTER — Other Ambulatory Visit: Payer: Self-pay

## 2021-12-30 ENCOUNTER — Other Ambulatory Visit (HOSPITAL_COMMUNITY): Payer: Self-pay

## 2021-12-30 DIAGNOSIS — I10 Essential (primary) hypertension: Secondary | ICD-10-CM

## 2021-12-30 MED ORDER — AMLODIPINE BESYLATE 10 MG PO TABS: 10.0000 mg | ORAL_TABLET | Freq: Every day | ORAL | 1 refills | Status: DC

## 2021-12-30 MED ORDER — OLMESARTAN MEDOXOMIL 20 MG PO TABS: 40.0000 mg | ORAL_TABLET | Freq: Every day | ORAL | Status: DC

## 2021-12-30 NOTE — Telephone Encounter (Signed)
I have spoken to patient to advise that Dr Birdie Riddle has okayed him to hold plavix starting 5 days prior to his upcoming procedure on 01/07/22. Patient verbalizes understanding and has relayed the message back to me correctly. ?

## 2021-12-30 NOTE — Telephone Encounter (Signed)
Patient has not returned any of my calls to inform him to hold his Plavix. Also appears he does not use his Mychart. Please advise if we need to cancel. ?

## 2022-01-01 ENCOUNTER — Other Ambulatory Visit (HOSPITAL_COMMUNITY): Payer: Self-pay

## 2022-01-07 ENCOUNTER — Ambulatory Visit (AMBULATORY_SURGERY_CENTER): Payer: Commercial Managed Care - HMO | Admitting: Internal Medicine

## 2022-01-07 ENCOUNTER — Encounter: Payer: Self-pay | Admitting: Internal Medicine

## 2022-01-07 VITALS — BP 110/83 | HR 52 | Temp 97.8°F | Resp 12 | Ht 64.0 in | Wt 102.0 lb

## 2022-01-07 DIAGNOSIS — R634 Abnormal weight loss: Secondary | ICD-10-CM | POA: Diagnosis not present

## 2022-01-07 DIAGNOSIS — K604 Rectal fistula: Secondary | ICD-10-CM | POA: Diagnosis not present

## 2022-01-07 DIAGNOSIS — R1013 Epigastric pain: Secondary | ICD-10-CM

## 2022-01-07 DIAGNOSIS — K222 Esophageal obstruction: Secondary | ICD-10-CM

## 2022-01-07 DIAGNOSIS — Z1211 Encounter for screening for malignant neoplasm of colon: Secondary | ICD-10-CM

## 2022-01-07 DIAGNOSIS — K2970 Gastritis, unspecified, without bleeding: Secondary | ICD-10-CM

## 2022-01-07 DIAGNOSIS — R110 Nausea: Secondary | ICD-10-CM

## 2022-01-07 DIAGNOSIS — K449 Diaphragmatic hernia without obstruction or gangrene: Secondary | ICD-10-CM

## 2022-01-07 DIAGNOSIS — D125 Benign neoplasm of sigmoid colon: Secondary | ICD-10-CM | POA: Diagnosis not present

## 2022-01-07 DIAGNOSIS — D123 Benign neoplasm of transverse colon: Secondary | ICD-10-CM | POA: Diagnosis not present

## 2022-01-07 DIAGNOSIS — D120 Benign neoplasm of cecum: Secondary | ICD-10-CM | POA: Diagnosis not present

## 2022-01-07 DIAGNOSIS — K2950 Unspecified chronic gastritis without bleeding: Secondary | ICD-10-CM | POA: Diagnosis not present

## 2022-01-07 DIAGNOSIS — K611 Rectal abscess: Secondary | ICD-10-CM

## 2022-01-07 DIAGNOSIS — D122 Benign neoplasm of ascending colon: Secondary | ICD-10-CM

## 2022-01-07 MED ORDER — SODIUM CHLORIDE 0.9 % IV SOLN: 500.0000 mL | Freq: Once | INTRAVENOUS | Status: DC

## 2022-01-07 MED ORDER — AMOXICILLIN-POT CLAVULANATE 875-125 MG PO TABS: 1.0000 | ORAL_TABLET | Freq: Two times a day (BID) | ORAL | 0 refills | Status: AC

## 2022-01-07 NOTE — Patient Instructions (Addendum)
Resume previous diet and medications. Start antibiotic-Augmentin 875 mg twice daily for 10 days for perirectal abscess.  Awaiting pathology results. Proceed with CT scan abd/pelvis to rule out pancreatobiliary dilation and pathology.   YOU HAD AN ENDOSCOPIC PROCEDURE TODAY AT [facility]:   Refer to the procedure report that was given to you for any specific questions about what was found during the examination.  If the procedure report does not answer your questions, please call your gastroenterologist to clarify.  If you requested that your care partner not be given the details of your procedure findings, then the procedure report has been included in a sealed envelope for you to review at your convenience later.  YOU SHOULD EXPECT: Some feelings of bloating in the abdomen. Passage of more gas than usual.  Walking can help get rid of the air that was put into your GI tract during the procedure and reduce the bloating. If you had a lower endoscopy (such as a colonoscopy or flexible sigmoidoscopy) you may notice spotting of blood in your stool or on the toilet paper. If you underwent a bowel prep for your procedure, you may not have a normal bowel movement for a few days.  Please Note:  You might notice some irritation and congestion in your nose or some drainage.  This is from the oxygen used during your procedure.  There is no need for concern and it should clear up in a day or so.  SYMPTOMS TO REPORT IMMEDIATELY:  Following lower endoscopy (colonoscopy or flexible sigmoidoscopy):  Excessive amounts of blood in the stool  Significant tenderness or worsening of abdominal pains  Swelling of the abdomen that is new, acute  Fever of 100F or higher  Following upper endoscopy (EGD)  Vomiting of blood or coffee ground material  New chest pain or pain under the shoulder blades  Painful or persistently difficult swallowing  New shortness of breath  Fever of 100F or higher  Black,  tarry-looking stools  For urgent or emergent issues, a gastroenterologist can be reached at any hour by calling [phone]. Do not use MyChart messaging for urgent concerns.    DIET:  We do recommend a small meal at first, but then you may proceed to your regular diet.  Drink plenty of fluids but you should avoid alcoholic beverages for 24 hours.  ACTIVITY:  You should plan to take it easy for the rest of today and you should NOT DRIVE or use heavy machinery until tomorrow (because of the sedation medicines used during the test).    FOLLOW UP: Our staff will call the number listed on your records 48-72 hours following your procedure to check on you and address any questions or concerns that you may have regarding the information given to you following your procedure. If we do not reach you, we will leave a message.  We will attempt to reach you two times.  During this call, we will ask if you have developed any symptoms of COVID 19. If you develop any symptoms (ie: fever, flu-like symptoms, shortness of breath, cough etc.) before then, please call [phone].  If you test positive for Covid 19 in the 2 weeks post procedure, please call and report this information to [place].    If any biopsies were taken you will be contacted by phone or by letter within the next 1-3 weeks.  Please call us at [phone] if you have not heard about the biopsies in 3 weeks.  SIGNATURES/CONFIDENTIALITY: You and/or your care partner have signed paperwork which will be entered into your electronic medical record.  These signatures attest to the fact that that the information above on your After Visit Summary has been reviewed and is understood.  Full responsibility of the confidentiality of this discharge information lies with you and/or your care-partner.

## 2022-01-07 NOTE — Progress Notes (Signed)
GASTROENTEROLOGY PROCEDURE H&P NOTE   Primary Care Physician: Midge Minium, MD    Reason for Procedure:  Nausea, upper abdominal pain, weight loss, GERD, colon cancer screening  Plan:    Upper endoscopy and colonoscopy  Patient is appropriate for endoscopic procedure(s) in the ambulatory ([facility]) setting.  The nature of the procedure, as well as the risks, benefits, and alternatives were carefully and thoroughly reviewed with the patient. Ample time for discussion and questions allowed. The patient understood, was satisfied, and agreed to proceed.     HPI: Daniel Gonzalez is a 66 y.o. male who presents for EGD and colonoscopy.  Medical history as below.  Tolerated the prep.  No recent chest pain or shortness of breath.  No abdominal pain today.  Plavix has been on hold x5 days.  Past Medical History:  Diagnosis Date   Cluster headache    hx of   Frequent episodic tension-type headache    GERD (gastroesophageal reflux disease)    HIV infection ([place])    Hypertension    Malignant neoplasm of prostate ([place]) 06/19/2020   Migraine    Pneumonia yrs ago   "walking pneumonia"   Prostate cancer ([place])    Stroke ([place]) yrs ago   no current neurlogist   Wears contact lenses    Wears dentures    upper full lower partial    Past Surgical History:  Procedure Laterality Date   COLONOSCOPY     cyst in back     removed from back   [place]  2021   RADIOACTIVE SEED IMPLANT N/A 09/10/2020   Procedure: RADIOACTIVE SEED IMPLANT/BRACHYTHERAPY IMPLANT;  Surgeon: Lucas Mallow, MD;  Location: [facility];  Service: Urology;  Laterality: N/A;   SPACE OAR INSTILLATION N/A 09/10/2020   Procedure: SPACE OAR INSTILLATION;  Surgeon: Lucas Mallow, MD;  Location: [facility];  Service: Urology;  Laterality: N/A;   TEE WITHOUT CARDIOVERSION N/A 06/24/2013   Procedure: TRANSESOPHAGEAL ECHOCARDIOGRAM (TEE);  Surgeon: Jolaine Artist, MD;   Location: [facility];  Service: Cardiovascular;  Laterality: N/A;    Prior to Admission medications   Medication Sig Start Date End Date Taking? Authorizing Provider  acetaminophen (TYLENOL) 650 MG CR tablet Take 1,300 mg by mouth every 8 (eight) hours as needed for pain.   Yes [provider]  alfuzosin (UROXATRAL) 10 MG 24 hr tablet Take 10 mg by mouth daily. 04/11/20  Yes [provider]  amLODipine (NORVASC) 10 MG tablet Take 1 tablet (10 mg total) by mouth daily. 12/30/21 03/30/22 Yes Midge Minium, MD  bictegravir-emtricitabine-tenofovir AF (BIKTARVY) 50-200-25 MG TABS tablet Take 1 tablet by mouth daily. 04/17/21  Yes Michel Bickers, MD  darunavir-cobicistat ([name]) 800-150 MG tablet TAKE 1 TABLET BY MOUTH DAILY. SWALLOW WHOLE. DO NOT CRUSH, BREAK OR CHEW TABLETS. TAKE WITH FOOD. 04/17/21  Yes Michel Bickers, MD  famotidine (PEPCID) 40 MG tablet Take 1 tablet (40 mg total) by mouth daily. 11/12/21  Yes Midge Minium, MD  metoprolol succinate (TOPROL-XL) 50 MG 24 hr tablet Take 1 tablet (50 mg total) by mouth daily. Take with or immediately following a meal. 12/27/21  Yes Midge Minium, MD  Polyethyl Glycol-Propyl Glycol 0.4-0.3 % SOLN Apply 1 drop to eye 2 (two) times daily.   Yes [provider]  traZODone (DESYREL) 100 MG tablet Take 1 tablet (100 mg total) by mouth at bedtime. 10/30/21  Yes Midge Minium, MD  clopidogrel (  PLAVIX) 75 MG tablet Take 1 tablet (75 mg total) by mouth daily. 12/10/21   Midge Minium, MD  Misc. Devices (PILL SPLITTER) MISC Please use to cut trazodone in 1/2. 01/27/18   Midge Minium, MD  sildenafil (VIAGRA) 100 MG tablet Take 1 tablet (100 mg total) by mouth daily as needed for erectile dysfunction. 11/17/17   Michel Bickers, MD  sucralfate (CARAFATE) 1 g tablet Take 1 tablet (1 g total) by mouth 4 (four) times daily -  with meals and at bedtime. 10/30/21   Midge Minium, MD  vitamin B-12  (CYANOCOBALAMIN) 1000 MCG tablet Take 1 tablet (1,000 mcg total) by mouth daily. 12/16/21   Willia Craze, NP    Current Outpatient Medications  Medication Sig Dispense Refill   acetaminophen (TYLENOL) 650 MG CR tablet Take 1,300 mg by mouth every 8 (eight) hours as needed for pain.     alfuzosin (UROXATRAL) 10 MG 24 hr tablet Take 10 mg by mouth daily.     amLODipine (NORVASC) 10 MG tablet Take 1 tablet (10 mg total) by mouth daily. 90 tablet 1   bictegravir-emtricitabine-tenofovir AF (BIKTARVY) 50-200-25 MG TABS tablet Take 1 tablet by mouth daily. 30 tablet 11   darunavir-cobicistat ([name]) 800-150 MG tablet TAKE 1 TABLET BY MOUTH DAILY. SWALLOW WHOLE. DO NOT CRUSH, BREAK OR CHEW TABLETS. TAKE WITH FOOD. 30 tablet 11   famotidine (PEPCID) 40 MG tablet Take 1 tablet (40 mg total) by mouth daily. 90 tablet 1   metoprolol succinate (TOPROL-XL) 50 MG 24 hr tablet Take 1 tablet (50 mg total) by mouth daily. Take with or immediately following a meal. 90 tablet 3   Polyethyl Glycol-Propyl Glycol 0.4-0.3 % SOLN Apply 1 drop to eye 2 (two) times daily.     traZODone (DESYREL) 100 MG tablet Take 1 tablet (100 mg total) by mouth at bedtime. 90 tablet 0   clopidogrel (PLAVIX) 75 MG tablet Take 1 tablet (75 mg total) by mouth daily. 90 tablet 0   Misc. Devices (PILL SPLITTER) MISC Please use to cut trazodone in 1/2. 1 each 0   sildenafil (VIAGRA) 100 MG tablet Take 1 tablet (100 mg total) by mouth daily as needed for erectile dysfunction. 90 tablet 0   sucralfate (CARAFATE) 1 g tablet Take 1 tablet (1 g total) by mouth 4 (four) times daily -  with meals and at bedtime. 90 tablet 0   vitamin B-12 (CYANOCOBALAMIN) 1000 MCG tablet Take 1 tablet (1,000 mcg total) by mouth daily.     Current Facility-Administered Medications  Medication Dose Route Frequency Provider Last Rate Last Admin   0.9 %  sodium chloride infusion  500 mL Intravenous Once Olegario Emberson, Lajuan Lines, MD       olmesartan (BENICAR) tablet 40 mg   40 mg Oral Daily Midge Minium, MD        Allergies as of 01/07/2022 - Review Complete 01/07/2022  Allergen Reaction Noted   Asa [aspirin] Palpitations 06/06/2013   Sulfamethoxazole-trimethoprim Itching 09/17/2012    Family History  Problem Relation Age of Onset   Arthritis Mother    Heart disease Mother    Hypertension Mother    Diabetes Mother    Arthritis Father    Heart disease Father    Hypertension Father    Cancer Father    Lung cancer Brother    Prostate cancer Brother    Prostate cancer Brother    Colon cancer Neg Hx    Pancreatic cancer Neg Hx  Breast cancer Neg Hx    Esophageal cancer Neg Hx    Rectal cancer Neg Hx    Ovarian cancer Neg Hx    Stomach cancer Neg Hx     Social History   Socioeconomic History   Marital status: Divorced    Spouse name: Not on file   Number of children: 3   Years of education: HS   Highest education level: Not on file  Occupational History   Occupation: Firefighter: [employer]  Tobacco Use   Smoking status: Every Day    Packs/day: 0.75    Years: 35.00    Pack years: 26.25    Types: Cigarettes   Smokeless tobacco: Never   Tobacco comments:    slowing down  Vaping Use   Vaping Use: Never used  Substance and Sexual Activity   Alcohol use: Yes    Comment: 1 beer per day and few shots of liquor   Drug use: Yes    Frequency: 7.0 times per week    Types: Marijuana    Comment: yesterday 01/06/22   Sexual activity: Yes    Partners: Female    Birth control/protection: Condom    Comment: declined condoms 03/2021  Other Topics Concern   Not on file  Social History Narrative   Patient lives at home alone.   Caffeine Use: 1 cup occasionally   Social Determinants of Health   Financial Resource Strain: Not on file  Food Insecurity: Not on file  Transportation Needs: Not on file  Physical Activity: Not on file  Stress: Not on file  Social Connections: Not on file  Intimate Partner Violence: Not  on file    Physical Exam: Vital signs in last 24 hours: '@BP'$  (!) 150/83   Pulse 74   Temp 97.8 F (36.6 C) (Temporal)   Ht '5\' 4"'$  (1.626 m)   Wt 102 lb (46.3 kg)   SpO2 100%   BMI 17.51 kg/m  GEN: NAD EYE: Sclerae anicteric ENT: MMM CV: Non-tachycardic Pulm: CTA b/l GI: Soft, NT/ND NEURO:  Alert & Oriented x 3   Zenovia Jarred, MD [facility]  01/07/2022 2:29 PM

## 2022-01-07 NOTE — Progress Notes (Signed)
Called to room to assist during endoscopic procedure.  Patient ID and intended procedure confirmed with present staff. Received instructions for my participation in the procedure from the performing physician.  

## 2022-01-07 NOTE — Op Note (Addendum)
[facility] Patient Name: Daniel Gonzalez Procedure Date: 01/07/2022 2:38 PM MRN: [id number] Endoscopist: Jerene Bears , MD Age: 67 Referring MD:  Date of Birth: 22-Jul-1956 Gender: Male Account #: [id number] Procedure:                Colonoscopy Indications:              Screening for colorectal malignant neoplasm, This                            is the patient's first colonoscopy Medicines:                Monitored Anesthesia Care Procedure:                Pre-Anesthesia Assessment:                           - Prior to the procedure, a History and Physical                            was performed, and patient medications and                            allergies were reviewed. The patient's tolerance of                            previous anesthesia was also reviewed. The risks                            and benefits of the procedure and the sedation                            options and risks were discussed with the patient.                            All questions were answered, and informed consent                            was obtained. Prior Anticoagulants: The patient has                            taken Plavix (clopidogrel), last dose was 5 days                            prior to procedure. ASA Grade Assessment: III - A                            patient with severe systemic disease. After                            reviewing the risks and benefits, the patient was                            deemed in satisfactory condition to undergo the  procedure.                           After obtaining informed consent, the colonoscope                            was passed under direct vision. Throughout the                            procedure, the patient's blood pressure, pulse, and                            oxygen saturations were monitored continuously. The                            Olympus #[id number] was introduced through the anus                             and advanced to the cecum, identified by                            appendiceal orifice and ileocecal valve. The                            colonoscopy was performed without difficulty. The                            patient tolerated the procedure well. The quality                            of the bowel preparation was good. The ileocecal                            valve, appendiceal orifice, and rectum were                            photographed. Scope In: 2:58:11 PM Scope Out: 3:22:44 PM Scope Withdrawal Time: 0 hours 20 minutes 7 seconds  Total Procedure Duration: 0 hours 24 minutes 33 seconds  Findings:                 The perianal exam findings include furuncle on the                            right upper buttocks with purulent drainage and                            palpable subcutaneous abscess.                           Four sessile polyps were found in the cecum. The                            polyps were 3 to 5 mm in size. These polyps were  removed with a cold snare. Resection and retrieval                            were complete.                           A 4 mm polyp was found in the ascending colon. The                            polyp was sessile. The polyp was removed with a                            cold snare. Resection and retrieval were complete.                           Two sessile polyps were found in the transverse                            colon. The polyps were 4 to 5 mm in size. These                            polyps were removed with a cold snare. Resection                            and retrieval were complete.                           Three sessile polyps were found in the sigmoid                            colon. The polyps were 4 to 7 mm in size. These                            polyps were removed with a cold snare. Resection                            and retrieval were complete.                            Multiple small and large-mouthed diverticula were                            found in the sigmoid colon and descending colon.                            Erythema was seen in association with the                            diverticular opening.                           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 This  was biopsied with a cold forceps for histology.                           Retroflexion in the rectum was not performed due to                            narrow rectal vault, but no additional pathology                            seen. Complications:            No immediate complications. Estimated Blood Loss:     Estimated blood loss was minimal. Impression:               - Furuncle on the right upper buttocks with                            purulent drainage and palpable subcutaneous abscess                            found on perianal exam.                           - Four 3 to 5 mm polyps in the cecum, removed with                            a cold snare. Resected and retrieved.                           - One 4 mm polyp in the ascending colon, removed                            with a cold snare. Resected and retrieved.                           - Two 4 to 5 mm polyps in the transverse colon,                            removed with a cold snare. Resected and retrieved.                           - Three 4 to 7 mm polyps in the sigmoid colon,                            removed with a cold snare. Resected and retrieved.                           - Moderate diverticulosis in the sigmoid colon and                            in the descending colon. Erythema was seen in  association with the diverticular opening.                           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 Recommendation:           - Patient has a contact number available for                             emergencies. The signs and symptoms of potential                            delayed complications were discussed with the                            patient. Return to normal activities tomorrow.                            Written discharge instructions were provided to the                            patient.                           - Resume previous diet.                           - Continue present medications.                           - Await pathology results.                           - CT scan abd/pelvis with IV contrast (see EGD                            report).                           - Augmentin 875 mg BID x 10 day for perirectal                            abscess.                           - Plavix can be resumed tomorrow at previous dose.                           - ID follow-up recommended.                           - Repeat colonoscopy is recommended for                            surveillance. The colonoscopy date will be  determined after pathology results from today's                            exam become available for review. Jerene Bears, MD 01/07/2022 3:36:09 PM This report has been signed electronically.

## 2022-01-07 NOTE — Progress Notes (Signed)
Pt non-responsive, VVS, Report to RN  °

## 2022-01-07 NOTE — Op Note (Signed)
[facility] Patient Name: Daniel Gonzalez Procedure Date: 01/07/2022 2:38 PM MRN: [id number] Endoscopist: Jerene Bears , MD Age: 66 Referring MD:  Date of Birth: 09-27-1955 Gender: Male Account #: [id number] Procedure:                Upper GI endoscopy Indications:              Epigastric abdominal pain, Suspected                            gastro-esophageal reflux disease, Nausea, Weight                            loss Medicines:                Monitored Anesthesia Care Procedure:                Pre-Anesthesia Assessment:                           - Prior to the procedure, a History and Physical                            was performed, and patient medications and                            allergies were reviewed. The patient's tolerance of                            previous anesthesia was also reviewed. The risks                            and benefits of the procedure and the sedation                            options and risks were discussed with the patient.                            All questions were answered, and informed consent                            was obtained. Prior Anticoagulants: The patient has                            taken Plavix (clopidogrel), last dose was 5 days                            prior to procedure. ASA Grade Assessment: III - A                            patient with severe systemic disease. After                            reviewing the risks and benefits, the patient was  deemed in satisfactory condition to undergo the                            procedure.                           After obtaining informed consent, the endoscope was                            passed under direct vision. Throughout the                            procedure, the patient's blood pressure, pulse, and                            oxygen saturations were monitored continuously. The                            GIF [id number] #[id number] was  introduced through the                            mouth, and advanced to the second part of duodenum.                            The upper GI endoscopy was accomplished without                            difficulty. The patient tolerated the procedure                            well. Scope In: Scope Out: Findings:                 Normal mucosa was found in the entire esophagus.                           A non-obstructing Schatzki ring was found at the                            gastroesophageal junction.                           A 3 cm hiatal hernia was present.                           The gastroesophageal flap valve was visualized                            endoscopically and classified as Hill Grade IV (no                            fold, wide open lumen, hiatal hernia present).                           Diffuse mild inflammation characterized by  congestion (edema), erythema and granularity was                            found in the gastric body and in the gastric                            antrum. Biopsies were taken with a cold forceps for                            histology and Helicobacter pylori testing.                           Congested and prominent mucosa without active                            bleeding and with no stigmata of bleeding also with                            erythema was found in the ampulla and in the second                            portion of the duodenum. Not overtly adenomatous in                            appearance. Biopsies were taken with a cold forceps                            for histology. Duodenum in examined segments                            otherwise normal. Complications:            No immediate complications. Estimated Blood Loss:     Estimated blood loss was minimal. Impression:               - Normal mucosa was found in the entire esophagus.                           - Non-obstructing Schatzki ring.                            - 3 cm hiatal hernia.                           - Gastritis. Biopsied.                           - Congested, erythematous and prominent duodenal                            mucosa near ampullary orifice in 2nd duodenum.                            Biopsied. Recommendation:           - Patient has a contact number  available for                            emergencies. The signs and symptoms of potential                            delayed complications were discussed with the                            patient. Return to normal activities tomorrow.                            Written discharge instructions were provided to the                            patient.                           - Resume previous diet.                           - Continue present medications.                           - Await pathology results.                           - Proceed with CT scan abd/pelvis. Rule out                            pancreatobiliary dilation and pathology. Also see                            colonoscopy report with concern of rectal fistula.                           - See the other procedure note for documentation of                            additional recommendations. Jerene Bears, MD 01/07/2022 3:28:36 PM This report has been signed electronically.

## 2022-01-07 NOTE — Progress Notes (Signed)
VS completed by CW.   Pt's states no medical or surgical changes since previsit or office visit.  

## 2022-01-08 ENCOUNTER — Telehealth: Payer: Self-pay | Admitting: *Deleted

## 2022-01-08 ENCOUNTER — Other Ambulatory Visit: Payer: Self-pay

## 2022-01-08 ENCOUNTER — Telehealth: Payer: Self-pay

## 2022-01-08 DIAGNOSIS — R110 Nausea: Secondary | ICD-10-CM

## 2022-01-08 DIAGNOSIS — K604 Rectal fistula: Secondary | ICD-10-CM

## 2022-01-08 DIAGNOSIS — R1013 Epigastric pain: Secondary | ICD-10-CM

## 2022-01-08 DIAGNOSIS — R634 Abnormal weight loss: Secondary | ICD-10-CM

## 2022-01-08 NOTE — Telephone Encounter (Signed)
  Follow up Call-     01/07/2022    1:36 PM  Call back number  Post procedure Call Back phone  # [phone]  Permission to leave phone message Yes     Patient questions:  Do you have a fever, pain , or abdominal swelling? No. Pain Score  0 *  Have you tolerated food without any problems? Yes.    Have you been able to return to your normal activities? Yes.    Do you have any questions about your discharge instructions: Diet   No. Medications  No. Follow up visit  No.  Do you have questions or concerns about your Care? No.  Actions: * If pain score is 4 or above: No action needed, pain <4.

## 2022-01-08 NOTE — Telephone Encounter (Signed)
First attempt follow up call to pt, no answer. 

## 2022-01-15 ENCOUNTER — Encounter: Payer: Self-pay | Admitting: Internal Medicine

## 2022-01-21 ENCOUNTER — Other Ambulatory Visit (HOSPITAL_COMMUNITY): Payer: Self-pay

## 2022-01-21 ENCOUNTER — Ambulatory Visit (INDEPENDENT_AMBULATORY_CARE_PROVIDER_SITE_OTHER)
Admission: RE | Admit: 2022-01-21 | Discharge: 2022-01-21 | Disposition: A | Discharge status: Home / Self Care | Payer: Commercial Managed Care - HMO | Source: Ambulatory Visit | Attending: Internal Medicine | Admitting: Internal Medicine

## 2022-01-21 DIAGNOSIS — R634 Abnormal weight loss: Secondary | ICD-10-CM | POA: Diagnosis not present

## 2022-01-21 DIAGNOSIS — K604 Rectal fistula: Secondary | ICD-10-CM | POA: Diagnosis not present

## 2022-01-21 DIAGNOSIS — R1013 Epigastric pain: Secondary | ICD-10-CM

## 2022-01-21 DIAGNOSIS — R110 Nausea: Secondary | ICD-10-CM

## 2022-01-21 MED ORDER — IOHEXOL 300 MG/ML  SOLN
80.0000 mL | Freq: Once | INTRAMUSCULAR | Status: AC | PRN
  Administered 2022-01-21: 80 mL via INTRAVENOUS

## 2022-01-23 ENCOUNTER — Other Ambulatory Visit: Payer: Self-pay

## 2022-01-23 ENCOUNTER — Telehealth: Payer: Self-pay

## 2022-01-23 DIAGNOSIS — R634 Abnormal weight loss: Secondary | ICD-10-CM

## 2022-01-23 DIAGNOSIS — R1013 Epigastric pain: Secondary | ICD-10-CM

## 2022-01-23 DIAGNOSIS — K862 Cyst of pancreas: Secondary | ICD-10-CM

## 2022-01-23 DIAGNOSIS — R110 Nausea: Secondary | ICD-10-CM

## 2022-01-23 NOTE — Telephone Encounter (Signed)
Thanks, see result note

## 2022-01-23 NOTE — Telephone Encounter (Signed)
Received call report on pt., See below:  IMPRESSION: 1. Enlargement of the pancreatic head with heterogeneous enhancement and two small simple appearing pancreatic cystic lesions in the region of the pancreaticoduodenal groove, with a small amount of associated peripancreatic fat stranding and ill-defined fluid. 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 MRI abdomen without and with IV contrast is indicated at this time for further evaluation of the cystic pancreatic head lesions, which most likely represent small pseudocysts. No biliary or pancreatic duct dilation. 2. 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 Small amount of fluid within the sinus tract without discrete drainable fluid collection. 3. Diffuse bladder wall thickening, nonspecific, cannot exclude acute cystitis. Correlate with urinalysis. 4. Mild sigmoid diverticulosis. 5. Aortic Atherosclerosis (ICD10-I70.0).

## 2022-01-27 ENCOUNTER — Other Ambulatory Visit (HOSPITAL_COMMUNITY): Payer: Self-pay

## 2022-02-02 ENCOUNTER — Ambulatory Visit (HOSPITAL_COMMUNITY)
Admission: RE | Admit: 2022-02-02 | Discharge: 2022-02-02 | Disposition: A | Discharge status: Home / Self Care | Payer: Commercial Managed Care - HMO | Source: Ambulatory Visit | Attending: Internal Medicine | Admitting: Internal Medicine

## 2022-02-02 DIAGNOSIS — K862 Cyst of pancreas: Secondary | ICD-10-CM | POA: Diagnosis present

## 2022-02-02 DIAGNOSIS — R634 Abnormal weight loss: Secondary | ICD-10-CM | POA: Insufficient documentation

## 2022-02-02 DIAGNOSIS — R110 Nausea: Secondary | ICD-10-CM | POA: Insufficient documentation

## 2022-02-02 DIAGNOSIS — R1013 Epigastric pain: Secondary | ICD-10-CM | POA: Diagnosis present

## 2022-02-02 MED ORDER — GADOBUTROL 1 MMOL/ML IV SOLN
4.6000 mL | Freq: Once | INTRAVENOUS | Status: AC | PRN
  Administered 2022-02-02: 4.6 mL via INTRAVENOUS

## 2022-02-13 ENCOUNTER — Other Ambulatory Visit (HOSPITAL_COMMUNITY): Payer: Self-pay

## 2022-02-19 ENCOUNTER — Other Ambulatory Visit: Payer: Self-pay | Admitting: General Surgery

## 2022-02-19 DIAGNOSIS — K603 Anal fistula: Secondary | ICD-10-CM

## 2022-02-24 ENCOUNTER — Other Ambulatory Visit (HOSPITAL_COMMUNITY): Payer: Self-pay

## 2022-03-01 ENCOUNTER — Ambulatory Visit
Admission: RE | Admit: 2022-03-01 | Discharge: 2022-03-01 | Disposition: A | Discharge status: Home / Self Care | Payer: Commercial Managed Care - HMO | Source: Ambulatory Visit | Attending: General Surgery | Admitting: General Surgery

## 2022-03-01 DIAGNOSIS — K603 Anal fistula: Secondary | ICD-10-CM

## 2022-03-01 MED ORDER — GADOBENATE DIMEGLUMINE 529 MG/ML IV SOLN
9.0000 mL | Freq: Once | INTRAVENOUS | Status: AC | PRN
  Administered 2022-03-01: 9 mL via INTRAVENOUS

## 2022-03-04 ENCOUNTER — Ambulatory Visit: Payer: Self-pay | Admitting: General Surgery

## 2022-03-04 NOTE — H&P (Signed)
PROVIDER:  Monico Blitz, MD  MRN: [id number] DOB: Apr 08, 1956 DATE OF ENCOUNTER: 03/04/2022  Subjective   Chief Complaint: Follow-up     History of Present Illness: Daniel Gonzalez is a 66 y.o. male who is seen today as an office consultation at the request of Dr. Pasty Arch for evaluation of Follow-up .    Patient underwent a screening colonoscopy in May 2023.  He was noted to have a draining lesion on the right upper buttocks with a palpable subcutaneous abscess.  There was a fistulous opening in his rectum noted on colonoscopy.   Biopsy of the fistula tract was obtained and showed mild nonspecific inflammation of the rectal mucosa.  Patient was noted to have approximately 20 pound weight loss with some mild abdominal pain.  Follow-up CT scan was obtained.  This showed a horseshoe shaped irregular subcutaneous sinus in the medial right gluteal fold with probable communication to the anorectal wall.  He was referred here for evaluation for fistula.   Review of Systems: A complete review of systems was obtained from the patient.  I have reviewed this information and discussed as appropriate with the patient.  See HPI as well for other ROS.   Medical History: Past Medical History:  Diagnosis Date   History of cancer    History of stroke     There is no problem list on file for this patient.   Past Surgical History:  Procedure Laterality Date   PROSTATE SURGERY       Allergies  Allergen Reactions   Aspirin Other (See Comments) and Palpitations    Heart race  Speeds up heart rate   Sulfamethoxazole Itching    Current Outpatient Medications on File Prior to Visit  Medication Sig Dispense Refill   acetaminophen (TYLENOL) 500 MG tablet Take 500 mg by mouth every 6 (six) hours     alfuzosin (UROXATRAL) 10 mg ER tablet Take 10 mg by mouth once daily     amLODIPine (NORVASC) 10 MG tablet Take 1 tablet by mouth once daily     BIKTARVY 50-200-25 mg tablet Take 1 tablet by  mouth once daily     clopidogreL (PLAVIX) 75 mg tablet Take by mouth     metoprolol succinate (TOPROL-XL) 50 MG XL tablet Take by mouth     olmesartan (BENICAR) 20 MG tablet Take by mouth     peg 400-propylene glycol (SYSTANE ULTRA) 0.4-0.3 % drops Apply 1 drop to eye 2 (two) times daily     [name] 800-150 mg-mg tablet TAKE 1 TABLET BY MOUTH DAILY. SWALLOW WHOLE. DO NOT CRUSH, BREAK OR CHEW TABLETS. TAKE WITH FOOD.     sildenafiL (VIAGRA) 100 MG tablet Take by mouth at bedtime as needed     sucralfate (CARAFATE) 1 gram tablet Take by mouth     traZODone (DESYREL) 100 MG tablet Take 100 mg by mouth at bedtime     No current facility-administered medications on file prior to visit.    No family history on file.   Social History   Tobacco Use  Smoking Status Never  Smokeless Tobacco Not on file     Social History   Socioeconomic History   Marital status: Divorced  Tobacco Use   Smoking status: Never  Vaping Use   Vaping Use: Never used  Substance and Sexual Activity   Alcohol use: Not Currently   Drug use: Yes    Comment: marijuana    Objective:      Exam Gen: NAD  CV: RRR Lungs: CTA Abd: soft Rectal: Fistula tract noted at posterior midline with 2 external openings tracking towards the anal canal.   Labs, Imaging and Diagnostic Testing: CT scan and colonoscopy images and reports reviewed.   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  Assessment and Plan:  There are no diagnoses linked to this encounter.   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    Biopsies do not suggest cancer or IBD.  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  He is having significant pain and drainage.  We will  start antibiotics today and plan for operative debridement and seton placement some time over the next couple weeks.    No follow-ups on file.    Rosario Adie, MD Colon and Rectal Surgery [facility]

## 2022-03-04 NOTE — H&P (View-Only) (Signed)
PROVIDER:  Monico Blitz, MD  MRN: [id number] DOB: [birth year] DATE OF ENCOUNTER: 03/04/2022  Subjective   Chief Complaint: Follow-up     History of Present Illness: Daniel Gonzalez is a 66 y.o. male who is seen today as an office consultation at the request of Dr. Pasty Arch for evaluation of Follow-up .    Patient underwent a screening colonoscopy in May 2023.  He was noted to have a draining lesion on the right upper buttocks with a palpable subcutaneous abscess.  There was a fistulous opening in his rectum noted on colonoscopy.   Biopsy of the fistula tract was obtained and showed mild nonspecific inflammation of the rectal mucosa.  Patient was noted to have approximately 20 pound weight loss with some mild abdominal pain.  Follow-up CT scan was obtained.  This showed a horseshoe shaped irregular subcutaneous sinus in the medial right gluteal fold with probable communication to the anorectal wall.  He was referred here for evaluation for fistula.   Review of Systems: A complete review of systems was obtained from the patient.  I have reviewed this information and discussed as appropriate with the patient.  See HPI as well for other ROS.   Medical History: Past Medical History:  Diagnosis Date   History of cancer    History of stroke     There is no problem list on file for this patient.   Past Surgical History:  Procedure Laterality Date   PROSTATE SURGERY       Allergies  Allergen Reactions   Aspirin Other (See Comments) and Palpitations    Heart race  Speeds up heart rate   Sulfamethoxazole Itching    Current Outpatient Medications on File Prior to Visit  Medication Sig Dispense Refill   acetaminophen (TYLENOL) 500 MG tablet Take 500 mg by mouth every 6 (six) hours     alfuzosin (UROXATRAL) 10 mg ER tablet Take 10 mg by mouth once daily     amLODIPine (NORVASC) 10 MG tablet Take 1 tablet by mouth once daily     BIKTARVY 50-200-25 mg tablet Take 1 tablet by  mouth once daily     clopidogreL (PLAVIX) 75 mg tablet Take by mouth     metoprolol succinate (TOPROL-XL) 50 MG XL tablet Take by mouth     olmesartan (BENICAR) 20 MG tablet Take by mouth     peg 400-propylene glycol (SYSTANE ULTRA) 0.4-0.3 % drops Apply 1 drop to eye 2 (two) times daily     [name] 800-150 mg-mg tablet TAKE 1 TABLET BY MOUTH DAILY. SWALLOW WHOLE. DO NOT CRUSH, BREAK OR CHEW TABLETS. TAKE WITH FOOD.     sildenafiL (VIAGRA) 100 MG tablet Take by mouth at bedtime as needed     sucralfate (CARAFATE) 1 gram tablet Take by mouth     traZODone (DESYREL) 100 MG tablet Take 100 mg by mouth at bedtime     No current facility-administered medications on file prior to visit.    No family history on file.   Social History   Tobacco Use  Smoking Status Never  Smokeless Tobacco Not on file     Social History   Socioeconomic History   Marital status: Divorced  Tobacco Use   Smoking status: Never  Vaping Use   Vaping Use: Never used  Substance and Sexual Activity   Alcohol use: Not Currently   Drug use: Yes    Comment: marijuana    Objective:      Exam Gen: NAD  CV: RRR Lungs: CTA Abd: soft Rectal: Fistula tract noted at posterior midline with 2 external openings tracking towards the anal canal.   Labs, Imaging and Diagnostic Testing: CT scan and colonoscopy images and reports reviewed.   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  Assessment and Plan:  There are no diagnoses linked to this encounter.   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    Biopsies do not suggest cancer or IBD.  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  He is having significant pain and drainage.  We will  start antibiotics today and plan for operative debridement and seton placement some time over the next couple weeks.    No follow-ups on file.    Rosario Adie, MD Colon and Rectal Surgery [facility]

## 2022-03-05 NOTE — Progress Notes (Signed)
DUE TO COVID-19 ONLY ONE VISITOR IS ALLOWED TO COME WITH YOU AND STAY IN THE WAITING ROOM ONLY DURING PRE OP AND PROCEDURE DAY OF SURGERY.  2 VISITOR  MAY VISIT WITH YOU AFTER SURGERY IN YOUR PRIVATE ROOM DURING VISITING HOURS ONLY! YOU MAY HAVE ONE PERSON SPEND THE NITE WITH YOU IN YOUR ROOM AFTER SURGERY.     Your procedure is scheduled on:         03/13/22   Report to [facility] Main  Entrance   Report to admitting at      0515           AM DO NOT [place], PICTURE ID OR WALLET DAY OF SURGERY.      Call this number if you have problems the morning of surgery [phone]    REMEMBER: NO  SOLID FOODS , CANDY, GUM OR MINTS AFTER [place] .       Marland Kitchen CLEAR LIQUIDS UNTIL     0430am            DAY OF SURGERY.        CLEAR LIQUID DIET   Foods Allowed      WATER BLACK COFFEE ( SUGAR OK, NO MILK, CREAM OR CREAMER) REGULAR AND DECAF  TEA ( SUGAR OK NO MILK, CREAM, OR CREAMER) REGULAR AND DECAF  PLAIN JELLO ( NO RED)  FRUIT ICES ( NO RED, NO FRUIT PULP)  POPSICLES ( NO RED)  JUICE- APPLE, WHITE GRAPE AND WHITE CRANBERRY  SPORT DRINK LIKE GATORADE ( NO RED)  CLEAR BROTH ( VEGETABLE , CHICKEN OR BEEF)                                                                     BRUSH YOUR TEETH MORNING OF SURGERY AND RINSE YOUR MOUTH OUT, NO CHEWING GUM CANDY OR MINTS.     Take these medicines the morning of surgery with A SIP OF WATER: mlodipine biktarvy, prezcobix    DO NOT TAKE ANY DIABETIC MEDICATIONS DAY OF YOUR SURGERY                               You may not have any metal on your body including hair pins and              piercings  Do not wear jewelry, make-up, lotions, powders or perfumes, deodorant             Do not wear nail polish on your fingernails.              IF YOU ARE A MALE AND WANT TO SHAVE UNDER ARMS OR LEGS PRIOR TO SURGERY YOU MUST DO SO AT LEAST 48 HOURS PRIOR TO SURGERY.              Men may shave face and neck.   Do  not bring valuables to the hospital. [place].  Contacts, dentures or bridgework may not be worn into surgery.  Leave suitcase in the car. After surgery it may be brought to your  room.     Patients discharged the day of surgery will not be allowed to drive home. IF YOU ARE HAVING SURGERY AND GOING HOME THE SAME DAY, YOU MUST HAVE AN ADULT TO DRIVE YOU HOME AND BE WITH YOU FOR 24 HOURS. YOU MAY GO HOME BY TAXI OR UBER OR ORTHERWISE, BUT AN ADULT MUST ACCOMPANY YOU HOME AND STAY WITH YOU FOR 24 HOURS.                Please read over the following fact sheets you were given: _____________________________________________________________________  [facility] - Preparing for Surgery Before surgery, you can play an important role.  Because skin is not sterile, your skin needs to be as free of germs as possible.  You can reduce the number of germs on your skin by washing with CHG (chlorahexidine gluconate) soap before surgery.  CHG is an antiseptic cleaner which kills germs and bonds with the skin to continue killing germs even after washing. Please DO NOT use if you have an allergy to CHG or antibacterial soaps.  If your skin becomes reddened/irritated stop using the CHG and inform your nurse when you arrive at Short Stay. Do not shave (including legs and underarms) for at least 48 hours prior to the first CHG shower.  You may shave your face/neck. Please follow these instructions carefully:  1.  Shower with CHG Soap the night before surgery and the  morning of Surgery.  2.  If you choose to wash your hair, wash your hair first as usual with your  normal  shampoo.  3.  After you shampoo, rinse your hair and body thoroughly to remove the  shampoo.                           4.  Use CHG as you would any other liquid soap.  You can apply chg directly  to the skin and wash                       Gently with a scrungie or clean washcloth.  5.  Apply the CHG  Soap to your body ONLY FROM THE NECK DOWN.   Do not use on face/ open                           Wound or open sores. Avoid contact with eyes, ears mouth and genitals (private parts).                       Wash face,  Genitals (private parts) with your normal soap.             6.  Wash thoroughly, paying special attention to the area where your surgery  will be performed.  7.  Thoroughly rinse your body with warm water from the neck down.  8.  DO NOT shower/wash with your normal soap after using and rinsing off  the CHG Soap.                9.  Pat yourself dry with a clean towel.            10.  Wear clean pajamas.            11.  Place clean sheets on your bed the night of your first shower and do not  sleep with pets. Day of Surgery : Do  not apply any lotions/deodorants the morning of surgery.  Please wear clean clothes to the hospital/surgery center.  FAILURE TO FOLLOW THESE INSTRUCTIONS MAY RESULT IN THE CANCELLATION OF YOUR SURGERY PATIENT SIGNATURE_________________________________  NURSE SIGNATURE__________________________________  ________________________________________________________________________

## 2022-03-05 NOTE — Progress Notes (Addendum)
Anesthesia Review:  PCP:  Annye Asa- LOV 11/12/21  Cardiologist : none  Chest x-ray : EKG : 03/10/22  Echo : Stress test: Cardiac Cath :  Activity level: can do a flight of stairs without difficutly  Sleep Study/ CPAP : none  Fasting Blood Sugar :      / Checks Blood Sugar -- times a day:   Blood Thinner/ Instructions /Last Dose: ASA / Instructions/ Last Dose :   Plavix - last dose on 03/07/22 per pt  Smoker- PT instructed to refrain from smoking for at least 24 hours prior to procedure.  PT voiced understanding.

## 2022-03-07 ENCOUNTER — Other Ambulatory Visit: Payer: Self-pay

## 2022-03-07 DIAGNOSIS — Z8673 Personal history of transient ischemic attack (TIA), and cerebral infarction without residual deficits: Secondary | ICD-10-CM

## 2022-03-07 MED ORDER — CLOPIDOGREL BISULFATE 75 MG PO TABS: 75.0000 mg | ORAL_TABLET | Freq: Every day | ORAL | 0 refills | Status: DC

## 2022-03-10 ENCOUNTER — Encounter (HOSPITAL_COMMUNITY): Payer: Self-pay

## 2022-03-10 ENCOUNTER — Other Ambulatory Visit: Payer: Self-pay

## 2022-03-10 ENCOUNTER — Encounter (HOSPITAL_COMMUNITY)
Admission: RE | Admit: 2022-03-10 | Discharge: 2022-03-10 | Disposition: A | Discharge status: Home / Self Care | Payer: Commercial Managed Care - HMO | Source: Ambulatory Visit | Attending: General Surgery | Admitting: General Surgery

## 2022-03-10 VITALS — BP 106/75 | HR 52 | Temp 98.3°F | Resp 16 | Ht 64.0 in | Wt 104.0 lb

## 2022-03-10 DIAGNOSIS — Z01818 Encounter for other preprocedural examination: Secondary | ICD-10-CM | POA: Diagnosis present

## 2022-03-10 LAB — BASIC METABOLIC PANEL
Anion gap: 9 (ref 5–15)
BUN: 14 mg/dL (ref 8–23)
CO2: 24 mmol/L (ref 22–32)
Calcium: 9.7 mg/dL (ref 8.9–10.3)
Chloride: 107 mmol/L (ref 98–111)
Creatinine, Ser: 1.1 mg/dL (ref 0.61–1.24)
GFR, Estimated: >60 mL/min (ref >60)
Glucose, Bld: 90 mg/dL (ref 70–99)
Potassium: 4.4 mmol/L (ref 3.5–5.1)
Sodium: 140 mmol/L (ref 135–145)

## 2022-03-10 LAB — CBC
HCT: 33 % — ABNORMAL LOW (ref 39.0–52.0)
Hemoglobin: 11.1 g/dL — ABNORMAL LOW (ref 13.0–17.0)
MCH: 34.2 pg — ABNORMAL HIGH (ref 26.0–34.0)
MCHC: 33.6 g/dL (ref 30.0–36.0)
MCV: 101.5 fL — ABNORMAL HIGH (ref 80.0–100.0)
Platelets: 313 10*3/uL (ref 150–400)
RBC: 3.25 MIL/uL — ABNORMAL LOW (ref 4.22–5.81)
RDW: 13.2 % (ref 11.5–15.5)
WBC: 7.4 10*3/uL (ref 4.0–10.5)
nRBC: 0 % (ref 0.0–0.2)

## 2022-03-12 ENCOUNTER — Encounter: Payer: Self-pay | Admitting: *Deleted

## 2022-03-13 ENCOUNTER — Ambulatory Visit (HOSPITAL_COMMUNITY): Payer: Commercial Managed Care - HMO | Admitting: Physician Assistant

## 2022-03-13 ENCOUNTER — Ambulatory Visit (HOSPITAL_COMMUNITY)
Admission: RE | Admit: 2022-03-13 | Discharge: 2022-03-13 | Disposition: A | Discharge status: Home / Self Care | Payer: Commercial Managed Care - HMO | Source: Ambulatory Visit | Attending: General Surgery | Admitting: General Surgery

## 2022-03-13 ENCOUNTER — Encounter (HOSPITAL_COMMUNITY)
Admission: RE | Disposition: A | Discharge status: Home / Self Care | Payer: Self-pay | Source: Ambulatory Visit | Attending: General Surgery

## 2022-03-13 ENCOUNTER — Ambulatory Visit (HOSPITAL_BASED_OUTPATIENT_CLINIC_OR_DEPARTMENT_OTHER): Payer: Commercial Managed Care - HMO | Admitting: Anesthesiology

## 2022-03-13 ENCOUNTER — Other Ambulatory Visit: Payer: Self-pay

## 2022-03-13 ENCOUNTER — Encounter (HOSPITAL_COMMUNITY): Payer: Self-pay | Admitting: General Surgery

## 2022-03-13 DIAGNOSIS — Z8673 Personal history of transient ischemic attack (TIA), and cerebral infarction without residual deficits: Secondary | ICD-10-CM | POA: Insufficient documentation

## 2022-03-13 DIAGNOSIS — K603 Anal fistula: Secondary | ICD-10-CM | POA: Diagnosis present

## 2022-03-13 DIAGNOSIS — D759 Disease of blood and blood-forming organs, unspecified: Secondary | ICD-10-CM | POA: Diagnosis not present

## 2022-03-13 DIAGNOSIS — D649 Anemia, unspecified: Secondary | ICD-10-CM | POA: Diagnosis not present

## 2022-03-13 DIAGNOSIS — F17210 Nicotine dependence, cigarettes, uncomplicated: Secondary | ICD-10-CM | POA: Diagnosis not present

## 2022-03-13 DIAGNOSIS — I10 Essential (primary) hypertension: Secondary | ICD-10-CM | POA: Diagnosis not present

## 2022-03-13 DIAGNOSIS — K604 Rectal fistula: Secondary | ICD-10-CM | POA: Diagnosis not present

## 2022-03-13 DIAGNOSIS — Z01818 Encounter for other preprocedural examination: Secondary | ICD-10-CM

## 2022-03-13 SURGERY — EXAM UNDER ANESTHESIA
Anesthesia: Monitor Anesthesia Care

## 2022-03-13 MED ORDER — FENTANYL CITRATE PF 50 MCG/ML IJ SOSY: 25.0000 ug | PREFILLED_SYRINGE | INTRAMUSCULAR | Status: DC | PRN

## 2022-03-13 MED ORDER — BUPIVACAINE LIPOSOME 1.3 % IJ SUSP
INTRAMUSCULAR | Status: AC
  Filled 2022-03-13: qty 20

## 2022-03-13 MED ORDER — PROPOFOL 500 MG/50ML IV EMUL
INTRAVENOUS | Status: DC | PRN
  Administered 2022-03-13: 75 ug/kg/min via INTRAVENOUS
  Administered 2022-03-13: 20 mg via INTRAVENOUS
  Administered 2022-03-13: 40 mg via INTRAVENOUS

## 2022-03-13 MED ORDER — BUPIVACAINE-EPINEPHRINE (PF) 0.5% -1:200000 IJ SOLN
INTRAMUSCULAR | Status: AC
  Filled 2022-03-13: qty 30

## 2022-03-13 MED ORDER — MIDAZOLAM HCL 2 MG/2ML IJ SOLN
INTRAMUSCULAR | Status: AC
  Filled 2022-03-13: qty 2

## 2022-03-13 MED ORDER — PROPOFOL 1000 MG/100ML IV EMUL
INTRAVENOUS | Status: AC
  Filled 2022-03-13: qty 100

## 2022-03-13 MED ORDER — ONDANSETRON HCL 4 MG/2ML IJ SOLN
INTRAMUSCULAR | Status: AC
  Filled 2022-03-13: qty 2

## 2022-03-13 MED ORDER — CHLORHEXIDINE GLUCONATE 0.12 % MT SOLN
15.0000 mL | Freq: Once | OROMUCOSAL | Status: AC
  Administered 2022-03-13: 15 mL via OROMUCOSAL

## 2022-03-13 MED ORDER — OXYCODONE HCL 5 MG PO TABS: 5.0000 mg | ORAL_TABLET | Freq: Four times a day (QID) | ORAL | 0 refills | Status: DC | PRN

## 2022-03-13 MED ORDER — AMISULPRIDE (ANTIEMETIC) 5 MG/2ML IV SOLN: 5.0000 mg | Freq: Once | INTRAVENOUS | Status: DC | PRN

## 2022-03-13 MED ORDER — ACETAMINOPHEN 500 MG PO TABS
1000.0000 mg | ORAL_TABLET | ORAL | Status: AC
  Administered 2022-03-13: 1000 mg via ORAL
  Filled 2022-03-13: qty 2

## 2022-03-13 MED ORDER — BUPIVACAINE LIPOSOME 1.3 % IJ SUSP
INTRAMUSCULAR | Status: DC | PRN
  Administered 2022-03-13: 20 mL

## 2022-03-13 MED ORDER — DEXAMETHASONE SODIUM PHOSPHATE 10 MG/ML IJ SOLN
INTRAMUSCULAR | Status: AC
  Filled 2022-03-13: qty 1

## 2022-03-13 MED ORDER — ORAL CARE MOUTH RINSE: 15.0000 mL | Freq: Once | OROMUCOSAL | Status: AC

## 2022-03-13 MED ORDER — MIDAZOLAM HCL 5 MG/5ML IJ SOLN
INTRAMUSCULAR | Status: DC | PRN
  Administered 2022-03-13: 2 mg via INTRAVENOUS

## 2022-03-13 MED ORDER — FENTANYL CITRATE (PF) 100 MCG/2ML IJ SOLN
INTRAMUSCULAR | Status: AC
  Filled 2022-03-13: qty 2

## 2022-03-13 MED ORDER — BUPIVACAINE-EPINEPHRINE 0.5% -1:200000 IJ SOLN
INTRAMUSCULAR | Status: DC | PRN
  Administered 2022-03-13: 30 mL

## 2022-03-13 MED ORDER — DEXAMETHASONE SODIUM PHOSPHATE 10 MG/ML IJ SOLN
INTRAMUSCULAR | Status: DC | PRN
  Administered 2022-03-13: 8 mg via INTRAVENOUS

## 2022-03-13 MED ORDER — LACTATED RINGERS IV SOLN
INTRAVENOUS | Status: DC
Start: 2022-03-13 — End: 2022-03-13

## 2022-03-13 MED ORDER — SODIUM CHLORIDE 0.9% FLUSH: 3.0000 mL | Freq: Two times a day (BID) | INTRAVENOUS | Status: DC

## 2022-03-13 MED ORDER — 0.9 % SODIUM CHLORIDE (POUR BTL) OPTIME
TOPICAL | Status: DC | PRN
  Administered 2022-03-13: 1000 mL

## 2022-03-13 MED ORDER — ONDANSETRON HCL 4 MG/2ML IJ SOLN
INTRAMUSCULAR | Status: DC | PRN
  Administered 2022-03-13: 4 mg via INTRAVENOUS

## 2022-03-13 SURGICAL SUPPLY — 31 items
APL SKNCLS STERI-STRIP NONHPOA (GAUZE/BANDAGES/DRESSINGS) ×1
BAG COUNTER SPONGE SURGICOUNT (BAG) IMPLANT
BAG SPNG CNTER NS LX DISP (BAG)
BENZOIN TINCTURE PRP APPL 2/3 (GAUZE/BANDAGES/DRESSINGS) ×3 IMPLANT
DRAPE LAPAROTOMY T 98X78 PEDS (DRAPES) ×3 IMPLANT
DRAPE UTILITY XL STRL (DRAPES) ×1 IMPLANT
DRSG PAD ABDOMINAL 8X10 ST (GAUZE/BANDAGES/DRESSINGS) ×3 IMPLANT
GAUZE 4X4 16PLY ~~LOC~~+RFID DBL (SPONGE) ×1 IMPLANT
GAUZE SPONGE 4X4 12PLY STRL (GAUZE/BANDAGES/DRESSINGS) ×1 IMPLANT
GLOVE BIO SURGEON STRL SZ 6.5 (GLOVE) ×3 IMPLANT
GLOVE BIOGEL PI IND STRL 7.0 (GLOVE) ×2 IMPLANT
GLOVE BIOGEL PI INDICATOR 7.0 (GLOVE) ×1
GLOVE INDICATOR 6.5 STRL GRN (GLOVE) ×3 IMPLANT
GOWN STRL REUS W/ TWL XL LVL3 (GOWN DISPOSABLE) ×4 IMPLANT
GOWN STRL REUS W/TWL XL LVL3 (GOWN DISPOSABLE) ×4
IV CATH 14GX2 1/4 (CATHETERS) IMPLANT
KIT BASIN OR (CUSTOM PROCEDURE TRAY) ×3 IMPLANT
KIT SIGMOIDOSCOPE (SET/KITS/TRAYS/PACK) ×1 IMPLANT
LOOP VESSEL MAXI BLUE (MISCELLANEOUS) ×1 IMPLANT
NEEDLE HYPO 22GX1.5 SAFETY (NEEDLE) ×3 IMPLANT
PACK BASIC VI WITH GOWN DISP (CUSTOM PROCEDURE TRAY) ×3 IMPLANT
PANTS MESH DISP LRG (UNDERPADS AND DIAPERS) ×3 IMPLANT
PENCIL SMOKE EVACUATOR (MISCELLANEOUS) ×1 IMPLANT
SUT CHROMIC 3 0 SH 27 (SUTURE) IMPLANT
SUT ETHIBOND 0 (SUTURE) ×1 IMPLANT
SUT VIC AB 4-0 P-3 18XBRD (SUTURE) IMPLANT
SUT VIC AB 4-0 P3 18 (SUTURE)
SYR CONTROL 10ML LL (SYRINGE) ×3 IMPLANT
TOWEL OR 17X26 10 PK STRL BLUE (TOWEL DISPOSABLE) ×3 IMPLANT
TOWEL OR NON WOVEN STRL DISP B (DISPOSABLE) ×3 IMPLANT
YANKAUER SUCT BULB TIP NO VENT (SUCTIONS) ×3 IMPLANT

## 2022-03-13 NOTE — Anesthesia Postprocedure Evaluation (Signed)
Anesthesia Post Note  Patient: Daniel Gonzalez  Procedure(s) Performed: Jasmine December UNDER ANESTHESIA, FISTULECTOMY AND PLACEMENT OF PENROSE DRAIN     Patient location during evaluation: PACU Anesthesia Type: MAC Level of consciousness: awake and alert Pain management: pain level controlled Vital Signs Assessment: post-procedure vital signs reviewed and stable Respiratory status: spontaneous breathing, nonlabored ventilation, respiratory function stable and patient connected to nasal cannula oxygen Cardiovascular status: stable and blood pressure returned to baseline Postop Assessment: no apparent nausea or vomiting Anesthetic complications: no   No notable events documented.  Last Vitals:  Vitals:   03/13/22 0925 03/13/22 0940  BP: 104/73 116/81  Pulse:  (!) 53  Resp: 12 14  Temp:  (!) 36.1 C  SpO2:  100%    Last Pain:  Vitals:   03/13/22 0940  TempSrc:   PainSc: 0-No pain                 Tiajuana Amass

## 2022-03-13 NOTE — Discharge Instructions (Signed)

## 2022-03-13 NOTE — Anesthesia Preprocedure Evaluation (Signed)
Anesthesia Evaluation  Patient identified by MRN, date of birth, ID band Patient awake    Reviewed: Allergy & Precautions, NPO status , Patient's Chart, lab work & pertinent test results  Airway Mallampati: II  TM Distance: >3 FB Neck ROM: Full    Dental  (+) Dental Advisory Given   Pulmonary Current Smoker,    breath sounds clear to auscultation       Cardiovascular hypertension, Pt. on medications and Pt. on home beta blockers  Rhythm:Regular Rate:Normal     Neuro/Psych  Neuromuscular disease CVA    GI/Hepatic Neg liver ROS, hiatal hernia, GERD  ,  Endo/Other  negative endocrine ROS  Renal/GU negative Renal ROS     Musculoskeletal   Abdominal   Peds  Hematology  (+) Blood dyscrasia, anemia ,   Anesthesia Other Findings   Reproductive/Obstetrics                             Anesthesia Physical Anesthesia Plan  ASA: 3  Anesthesia Plan: MAC   Post-op Pain Management: Tylenol PO (pre-op)*   Induction:   PONV Risk Score and Plan: 0 and Propofol infusion and Ondansetron  Airway Management Planned: Natural Airway and Simple Face Mask  Additional Equipment: None  Intra-op Plan:   Post-operative Plan:   Informed Consent: I have reviewed the patients History and Physical, chart, labs and discussed the procedure including the risks, benefits and alternatives for the proposed anesthesia with the patient or authorized representative who has indicated his/her understanding and acceptance.       Plan Discussed with: CRNA  Anesthesia Plan Comments:         Anesthesia Quick Evaluation

## 2022-03-13 NOTE — Interval H&P Note (Deleted)
History and Physical Interval Note:  03/13/2022 7:24 AM  Daniel Gonzalez  has presented today for surgery, with the diagnosis of RECTAL FISTULA.  The various methods of treatment have been discussed with the patient and family. After consideration of risks, benefits and other options for treatment, the patient has consented to  Procedure(s): EXAM UNDER ANESTHESIA (N/A) PLACEMENT OF SETON (N/A) as a surgical intervention.  The patient's history has been reviewed, patient examined, no change in status, stable for surgery.  I have reviewed the patient's chart and labs.  Questions were answered to the patient's satisfaction.     Rosario Adie

## 2022-03-13 NOTE — Op Note (Signed)
03/13/2022  8:29 AM  PATIENT:  Daniel Gonzalez  66 y.o. male  Patient Care Team: Midge Minium, MD as PCP - General (Family Medicine) Michel Bickers, MD as PCP - Infectious Diseases (Infectious Diseases) Cira Rue, RN as Oncology Nurse Navigator  PRE-OPERATIVE DIAGNOSIS:  RECTAL FISTULA  POST-OPERATIVE DIAGNOSIS:  RECTAL FISTULA  PROCEDURE:  EXAM UNDER ANESTHESIA, FISTULECTOMY AND PLACEMENT OF PENROSE DRAIN RIGID PROCTOSCOPY   Surgeon(s): Leighton Ruff, MD  ASSISTANT: none   ANESTHESIA:   local and MAC  SPECIMEN:  Source of Specimen:  fistula tract  DISPOSITION OF SPECIMEN:  PATHOLOGY  COUNTS:  YES  PLAN OF CARE: Discharge to home after PACU  PATIENT DISPOSITION:  PACU - hemodynamically stable.  INDICATION: Perianal infection with abscess   OR FINDINGS: Anal fistula that appears to be arising from the deep post anal space.  No communication between the rectum and perianal space could be identified.  DESCRIPTION: the patient was identified in the preoperative holding area and taken to the OR where they were laid on the operating room table.  MAC anesthesia was induced without difficulty. The patient was then positioned in prone jackknife position with buttocks gently taped apart.  The patient was then prepped and draped in usual sterile fashion.  SCDs were noted to be in place prior to the initiation of anesthesia. A surgical timeout was performed indicating the correct patient, procedure, positioning and need for preoperative antibiotics.  A rectal block was performed using Marcaine with epinephrine mixed with Experel.    I began with a digital rectal exam.  A small anterior midline opening could be palpated.  The patient had good rectal tone.  No other masses were noted.  I then placed a Hill-Ferguson anoscope into the anal canal and evaluated this completely.  The anterior midline opening was visualized.  This was then probed gently with a right angle clamp.  It  did not traverse past the rectal wall.  There was no purulence draining.  This appeared to be a complication of his radiation treatment to his prostate.  I then evaluated the perianal fistula.  I began to open up the fistula tract and the left posterior ischial rectal space.  I was able to carried this down to the level of the deep postanal space.  I was unable to find any communication to the anus or rectum.  I opened this entire area using electrocautery and evaluated it visually.  This appeared to be a blind end and the deep postanal space.  I also evaluated the anal canal and distal rectum using a rigid proctoscope.  No other internal openings could be visualized.  A fistulectomy was performed.  This was sent to pathology for further examination.  A Penrose drain was placed in the deep postanal space and brought out at posterior midline.  This was secured in place with a 2-0 nylon suture.  The overlying ischio rectal space was closed using interrupted 0 Vicryl sutures.  The subcutaneous tissue was left open and a sterile dressing was applied.  The patient was then awakened from anesthesia and sent to the postanesthesia care unit in stable condition.  All counts were correct per operating room staff.  Rosario Adie, MD  [facility]

## 2022-03-13 NOTE — Interval H&P Note (Signed)
History and Physical Interval Note:  03/13/2022 7:30 AM  Daniel Gonzalez  has presented today for surgery, with the diagnosis of RECTAL FISTULA.  The various methods of treatment have been discussed with the patient and family. After consideration of risks, benefits and other options for treatment, the patient has consented to  Procedure(s): EXAM UNDER ANESTHESIA (N/A) PLACEMENT OF SETON (N/A) as a surgical intervention.  The patient's history has been reviewed, patient examined, no change in status, stable for surgery.  I have reviewed the patient's chart and labs.  Questions were answered to the patient's satisfaction.     Rosario Adie

## 2022-03-13 NOTE — Transfer of Care (Signed)
Immediate Anesthesia Transfer of Care Note  Patient: Daniel Gonzalez  Procedure(s) Performed: Jasmine December UNDER ANESTHESIA, FISTULECTOMY AND PLACEMENT OF PENROSE DRAIN  Patient Location: PACU  Anesthesia Type:MAC  Level of Consciousness: awake  Airway & Oxygen Therapy: Patient Spontanous Breathing  Post-op Assessment: Report given to RN  Post vital signs: stable  Last Vitals:  Vitals Value Taken Time  BP 112/74 03/13/22 0837  Temp    Pulse 49 03/13/22 0838  Resp 21 03/13/22 0838  SpO2 100 % 03/13/22 0838  Vitals shown include unvalidated device data.  Last Pain:  Vitals:   03/13/22 0608  TempSrc:   PainSc: 0-No pain         Complications: No notable events documented.

## 2022-03-14 LAB — SURGICAL PATHOLOGY

## 2022-03-17 ENCOUNTER — Other Ambulatory Visit (HOSPITAL_COMMUNITY): Payer: Self-pay

## 2022-03-24 ENCOUNTER — Other Ambulatory Visit (HOSPITAL_COMMUNITY): Payer: Self-pay

## 2022-04-01 ENCOUNTER — Ambulatory Visit (INDEPENDENT_AMBULATORY_CARE_PROVIDER_SITE_OTHER): Payer: Commercial Managed Care - HMO | Admitting: Internal Medicine

## 2022-04-01 ENCOUNTER — Encounter: Payer: Self-pay | Admitting: Internal Medicine

## 2022-04-01 VITALS — BP 90/60 | HR 59 | Ht 64.0 in | Wt 102.6 lb

## 2022-04-01 DIAGNOSIS — K604 Rectal fistula: Secondary | ICD-10-CM

## 2022-04-01 DIAGNOSIS — K852 Alcohol induced acute pancreatitis without necrosis or infection: Secondary | ICD-10-CM | POA: Diagnosis not present

## 2022-04-01 DIAGNOSIS — K862 Cyst of pancreas: Secondary | ICD-10-CM

## 2022-04-01 DIAGNOSIS — Z86010 Personal history of colonic polyps: Secondary | ICD-10-CM | POA: Diagnosis not present

## 2022-04-01 NOTE — Progress Notes (Signed)
Subjective:    Patient ID: Daniel Gonzalez, male    DOB: [birth year], 66 y.o.   MRN: [id number]  HPI Daniel Gonzalez is a 66 year old male with HIV, prostate cancer status postradiation, history of CVA on Plavix, hypertension, recent pancreatitis with pancreatic pseudocysts, rectal fistula status post recent fistulotomy and history of multiple adenomatous colon polyps who is here for follow-up.  He is here alone today.  I last saw him on 01/07/2022 for upper and lower endoscopy.  See those notes for details.  Multiple adenomatous polyps were removed and a rectal fistula was seen.  He was referred to Dr. Marcello Moores with colorectal surgery.  On 03/13/2022 patient went to the OR for an EUA, fistulotomy and Penrose drain placement for treatment of a rectal fistula by Dr. Marcello Moores.  Histology of the fistula tract showed fibroadipose tissue with minimal inflammation and focal areas of hemorrhage.  No epithelial lining was seen.  No malignancy.  He reports he is feeling much improved.  He finds the Penrose drain to be annoying but it is not causing pain.  His abdominal pain has completely resolved and he asked what we did to make the pain go away.  He does admit that he was completely alcohol free from early June until just this past Saturday.  On Saturday he had 2 beers and on Sunday he had 3 beers.  This was his first alcohol intake in over 8 weeks.  He reports his appetite is much better though he is craving sweets like cookies and candies.  He is mostly lactose intolerant so he tries to avoid milk products.  He has not had any nausea or vomiting.  His bowel movements have been regular he has not seen blood.  Review of Systems As per HPI, otherwise negative  Current Medications, Allergies, Past Medical History, Past Surgical History, Family History and Social History were reviewed in [employer] record.     Objective:   Physical Exam BP 90/60   Pulse (!) 59   Ht '5\' 4"'$  (1.626 m)    Wt 102 lb 9.6 oz (46.5 kg)   BMI 17.61 kg/m  Gen: awake, alert, NAD HEENT: anicteric  CV: RRR, no mrg Pulm: CTA b/l Abd: soft, thin, NT/ND, +BS throughout Ext: no c/c/e Neuro: nonfocal  MRI PELVIS WITHOUT AND WITH CONTRAST   TECHNIQUE: Multiplanar multisequence MR imaging of the pelvis was performed both before and after administration of intravenous contrast.   CONTRAST:  39m MULTIHANCE GADOBENATE DIMEGLUMINE 529 MG/ML IV SOLN   COMPARISON:  CT abdomen pelvis, 01/21/2022   FINDINGS: Urinary Tract:  No abnormality visualized.   Bowel: Complex intersphincteric anal fistula arising from the posterior right aspect of the rectum, approximately 7 o clock face in lithotomy position (series 10, image 17). Contrast enhancing fistula tract extends posteriorly towards the right aspect of the gluteal fold and branches, with at least two skin insertion sites at the right aspect of the gluteal fold (series 10, image 20, 15, series 9, image 20, 23). Longest fistula tract length is approximately 5 cm to skin insertion. Small rim enhancing subcutaneous fluid collections in the vicinity of the skin insertion sites, superiorly measuring 1.1 x 1.0 cm (series 10, image 17) and inferiorly measuring 1.1 x 0.7 cm (series 10, image 20). Unremarkable bowel loops internal to the pelvis.   Vascular/Lymphatic: No pathologically enlarged lymph nodes. No significant vascular abnormality seen.   Reproductive: Prostate brachytherapy with associated susceptibility artifact.   Other:  None.  Musculoskeletal: No suspicious bone lesions identified. Nonspecific edema of the right gluteus musculature and overlying subcutaneous soft tissues, this site substantially removed from fistula site (series 7, image 8).   IMPRESSION: 1. Complex intersphincteric anal fistula arising from the posterior right aspect of the rectum, approximately 7 o clock face in lithotomy position. Contrast enhancing fistula  tract extends posteriorly towards the right aspect of the gluteal fold and branches, with at least two skin insertion sites at the right aspect of the gluteal fold. Maximum fistula tract length is approximately 5 cm to skin insertion. 2. Small rim enhancing subcutaneous fluid collections in the vicinity of the skin insertion sites, consistent with small abscess cavities. 3. Nonspecific edema of the right gluteus musculature and overlying subcutaneous soft tissues, this site substantially removed from fistula site and most likely reflecting minor incidental trauma and contusion. 4. Prostate brachytherapy.     Electronically Signed   By: Delanna Ahmadi M.D.   On: 03/03/2022 13:03    MRI ABDOMEN WITHOUT AND WITH CONTRAST (INCLUDING MRCP)   TECHNIQUE: Multiplanar multisequence MR imaging of the abdomen was performed both before and after the administration of intravenous contrast. Heavily T2-weighted images of the biliary and pancreatic ducts were obtained, and three-dimensional MRCP images were rendered by post processing.   CONTRAST:  4.32m GADAVIST GADOBUTROL 1 MMOL/ML IV SOLN   COMPARISON:  CT abdomen pelvis, 01/21/2022   FINDINGS: Lower chest: No acute findings.  Cardiomegaly.   Hepatobiliary: No mass or other parenchymal abnormality identified. No gallstones. No biliary ductal dilatation.   Pancreas: Fluid signal cystic lesions of the central pancreatic head, the larger of two lesions increased in size, measuring 2.3 x 2.0 cm, previously 1.6 x 1.4 cm (series 3, image 27). Smaller adjacent lesion is not significantly changed, measuring 1.5 x 1.1 cm (series 3, image 25). Hypoenhancement of the adjacent pancreatic head parenchyma (series 1001, image 91). Pancreatic ductal dilatation.   Spleen:  Within normal limits in size and appearance.   Adrenals/Urinary Tract: Normal adrenal glands. No renal masses or suspicious contrast enhancement identified. No evidence  of hydronephrosis.   Stomach/Bowel: Visualized portions within the abdomen are unremarkable.   Vascular/Lymphatic: No pathologically enlarged lymph nodes identified. No abdominal aortic aneurysm demonstrated.   Other:  None.   Musculoskeletal: No suspicious osseous lesions identified.   IMPRESSION: 1. Fluid signal cystic lesions of the central pancreatic head, the larger of two lesions increased in size, measuring 2.3 x 2.0 cm, previously 1.6 x 1.4 cm. Smaller adjacent lesion is not significantly changed, measuring 1.5 x 1.1 cm. These are almost certainly developing pseudocysts given behavior and the presence of acute inflammatory findings on recent prior CT. 2. Hypoenhancement of the adjacent pancreatic head parenchyma, possibly reflecting parenchymal necrosis in the setting of pancreatitis. Underlying mass not strictly excluded. Consider short interval follow-up MR in 3-6 months to ensure stability. 3. Previously seen acute inflammatory findings about the pancreaticoduodenal groove are resolved. 4. No pancreatic ductal dilatation. 5. Cardiomegaly.     Electronically Signed   By: ADelanna AhmadiM.D.   On: 02/03/2022 16:50     Latest Ref Rng & Units 03/10/2022    3:10 PM 10/30/2021   11:29 AM 09/06/2020   10:06 AM  CBC  WBC 4.0 - 10.5 K/uL 7.4  4.3  3.7   Hemoglobin 13.0 - 17.0 g/dL 11.1  11.3  13.3   Hematocrit 39.0 - 52.0 % 33.0  33.2  37.8   Platelets 150 - 400 K/uL 313  175.0  236    CMP     Component Value Date/Time   NA 140 03/10/2022 1510   K 4.4 03/10/2022 1510   CL 107 03/10/2022 1510   CO2 24 03/10/2022 1510   GLUCOSE 90 03/10/2022 1510   BUN 14 03/10/2022 1510   CREATININE 1.10 03/10/2022 1510   CREATININE 0.89 10/20/2019 1620   CALCIUM 9.7 03/10/2022 1510   PROT 6.5 10/30/2021 1129   ALBUMIN 4.0 10/30/2021 1129   AST 20 10/30/2021 1129   ALT 10 10/30/2021 1129   ALKPHOS 51 10/30/2021 1129   BILITOT 0.4 10/30/2021 1129   GFRNONAA >60 03/10/2022  1510   GFRAA >60 08/26/2015 0622       Assessment & Plan:  66 year old male with HIV, prostate cancer status postradiation, history of CVA on Plavix, hypertension, recent pancreatitis with pancreatic pseudocysts, rectal fistula status post recent fistulotomy and history of multiple adenomatous colon polyps who is here for follow-up.   Alcohol-induced pancreatitis with pancreatic pseudocyst --we discussed pancreatitis today and I reminded him that alcohol was the most likely factor involved in his upper abdominal pain, weight loss and nausea.  I congratulated him on the significant improvement in alcohol reduction.  He was alcohol abstinent for nearly 8 weeks.  He did drink again on 2 days this weekend and I cautioned him that there is really no safe amount of alcohol.  He should try to avoid all alcohol going forward over risk of future pancreatitis and complications thereof. -- Alcohol avoidance -- Clinically improved; focus on high-protein diet -- Repeat MRI with and without contrast plus MRCP in mid September 2023; follow-up pancreatic pseudocyst and exclude underlying mass, the latter which is felt unlikely  2.  Rectal fistula with gluteal abscess --treated with fistulotomy at EUA in July.  Penrose drain remains in place.  He has follow-up with Dr. Marcello Moores next Monday.  I expect the drain will be removed.  There is no evidence endoscopically or histologically of IBD  3.  History of multiple adenomatous colon polyps --I reminded him of my recommendation for a 1 year surveillance colonoscopy which for him would be in May 2024 -- Colonoscopy May 2024  4.  HIV --controlled on antivirals and following with ID.  He will see Dr. Megan Salon on 04/17/2022  5.  Prior stroke on Plavix --he continues clopidogrel daily  65-monthfollow-up with me  40 minutes total spent today including patient facing time, coordination of care, reviewing medical history/procedures/pertinent radiology studies, and  documentation of the encounter.

## 2022-04-01 NOTE — Patient Instructions (Addendum)
Continue to remain off alcohol.  You have been scheduled for an MRI/MRCP at [facility] on 05/06/22. Your appointment time is 10:00am. Please arrive to admitting (at main entrance of the hospital) 30 minutes prior to your appointment time for registration purposes. Please make certain not to have anything to eat or drink 6 hours prior to your test. In addition, if you have any metal in your body, have a pacemaker or defibrillator, please be sure to let your ordering physician know. This test typically takes 45 minutes to 1 hour to complete. Should you need to reschedule, please call [phone] to do so.  The [facility] providers would like to encourage you to use [facility] to communicate with providers for non-urgent requests or questions.  Due to long hold times on the telephone, sending your provider a message by [facility] may be a faster and more efficient way to get a response.  Please allow 48 business hours for a response.  Please remember that this is for non-urgent requests.   Due to recent changes in healthcare laws, you may see the results of your imaging and laboratory studies on MyChart before your provider has had a chance to review them.  We understand that in some cases there may be results that are confusing or concerning to you. Not all laboratory results come back in the same time frame and the provider may be waiting for multiple results in order to interpret others.  Please give [place] 48 hours in order for your provider to thoroughly review all the results before contacting the office for clarification of your results.

## 2022-04-10 ENCOUNTER — Other Ambulatory Visit (HOSPITAL_COMMUNITY): Payer: Self-pay

## 2022-04-14 ENCOUNTER — Other Ambulatory Visit (HOSPITAL_COMMUNITY): Payer: Self-pay

## 2022-04-14 ENCOUNTER — Other Ambulatory Visit: Payer: Self-pay | Admitting: Internal Medicine

## 2022-04-14 DIAGNOSIS — B20 Human immunodeficiency virus [HIV] disease: Secondary | ICD-10-CM

## 2022-04-17 ENCOUNTER — Other Ambulatory Visit: Payer: Self-pay

## 2022-04-17 ENCOUNTER — Ambulatory Visit (INDEPENDENT_AMBULATORY_CARE_PROVIDER_SITE_OTHER): Payer: Commercial Managed Care - HMO | Admitting: Internal Medicine

## 2022-04-17 ENCOUNTER — Encounter: Payer: Self-pay | Admitting: Internal Medicine

## 2022-04-17 DIAGNOSIS — B20 Human immunodeficiency virus [HIV] disease: Secondary | ICD-10-CM

## 2022-04-17 DIAGNOSIS — F3342 Major depressive disorder, recurrent, in full remission: Secondary | ICD-10-CM

## 2022-04-17 DIAGNOSIS — R634 Abnormal weight loss: Secondary | ICD-10-CM | POA: Diagnosis not present

## 2022-04-17 MED ORDER — PREZCOBIX 800-150 MG PO TABS: ORAL_TABLET | ORAL | 11 refills | Status: DC

## 2022-04-17 MED ORDER — BIKTARVY 50-200-25 MG PO TABS: 1.0000 | ORAL_TABLET | Freq: Every day | ORAL | 11 refills | Status: DC

## 2022-04-17 NOTE — Progress Notes (Signed)
Patient Active Problem List   Diagnosis Date Noted   Chronic diarrhea 09/07/2019    Priority: High   Cerebral aneurysm, nonruptured 09/02/2013    Priority: High   History of CVA (cerebrovascular accident) 06/22/2013    Priority: High   Essential hypertension 05/01/2010    Priority: High   CIGARETTE SMOKER 09/17/2006    Priority: High   Human immunodeficiency virus (HIV) disease ([place]) 09/17/2006    Priority: Medium    Insomnia 10/18/2020   Malignant neoplasm of prostate ([place]) 06/19/2020   Depression 04/17/2020   Ruptured epithelial cyst 06/08/2019   Impacted cerumen of left ear 05/17/2019   B12 deficiency 10/23/2017   Unintentional weight loss 12/15/2016   Protein-calorie malnutrition ([place]) 08/25/2015   Normocytic anemia 08/24/2015   Routine general medical examination at a health care facility 01/07/2013   CARPAL TUNNEL SYNDROME 01/02/2010   GENITAL HERPES 09/17/2006   ERECTILE DYSFUNCTION 09/17/2006   ABUSE, ALCOHOL, 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   ALFUZOSIN (UROXATRAL) 10 MG 24 HR TABLET    Take 10 mg by mouth daily.   AMLODIPINE (NORVASC) 10 MG TABLET    Take 1 tablet (10 mg total) by mouth daily.   CLOPIDOGREL (PLAVIX) 75 MG TABLET    Take 1 tablet (75 mg total) by mouth daily.   METOPROLOL SUCCINATE (TOPROL-XL) 50 MG 24 HR TABLET    Take 1 tablet (50 mg total) by mouth daily. Take with or immediately following a meal.   MISC. DEVICES (PILL SPLITTER) MISC    Please use to cut trazodone in 1/2.   OLMESARTAN (BENICAR) 40 MG TABLET    Take 40 mg by mouth daily.   POLYETHYL GLYCOL-PROPYL GLYCOL 0.4-0.3 % SOLN    Apply 1 drop to eye 2 (two) times daily as needed (dry eyes).   SILDENAFIL (VIAGRA) 100 MG TABLET    Take 1 tablet (100 mg  total) by mouth daily as needed for erectile dysfunction.   TAMSULOSIN (FLOMAX) 0.4 MG CAPS CAPSULE    Take 0.8 mg by mouth daily.   TRAZODONE (DESYREL) 100 MG TABLET    Take 1 tablet (100 mg total) by mouth at bedtime.   VITAMIN B-12 (CYANOCOBALAMIN) 1000 MCG TABLET    Take 1 tablet (1,000 mcg total) by mouth daily.  Modified Medications   Modified Medication Previous Medication   BICTEGRAVIR-EMTRICITABINE-TENOFOVIR AF (BIKTARVY) 50-200-25 MG TABS TABLET bictegravir-emtricitabine-tenofovir AF (BIKTARVY) 50-200-25 MG TABS tablet      Take 1 tablet by mouth daily.    Take 1 tablet by mouth daily.   DARUNAVIR-COBICISTAT ([name]) 800-150 MG TABLET darunavir-cobicistat ([name]) 800-150 MG tablet      TAKE 1 TABLET BY MOUTH DAILY. SWALLOW WHOLE. DO NOT CRUSH, BREAK OR CHEW TABLETS. TAKE WITH FOOD.    TAKE 1 TABLET BY MOUTH DAILY. SWALLOW WHOLE. DO NOT CRUSH, BREAK OR CHEW TABLETS. TAKE WITH FOOD.  Discontinued Medications   No medications on file    Subjective: Daniel Gonzalez is in for his routine HIV follow-up visit.  He denies any problems obtaining, taking or tolerating his Biktarvy or [name].  He takes them each morning around 10 AM.  He does not recall missing doses.  He had surgery to resect  a chronic rectal fistula complicated by abscess in July.  He was treated with antibiotics postoperatively and is feeling much better.  He is still smoking cigarettes but says he is trying to cut down.  He has drastically decreased the amount of alcohol he drinks.  He says that he quit drinking completely for a month and a half and now only drinks about 2 beers weekly.  Review of Systems: Review of Systems  Constitutional:  Positive for weight loss. Negative for fever.    Past Medical History:  Diagnosis Date   Anal fistula    Aortic atherosclerosis (HCC)    Diverticulosis    GERD (gastroesophageal reflux disease)    Hiatal hernia    HIV infection ([place])    Hypertension    Malignant neoplasm of  prostate ([place]) 06/19/2020   Pancreatic cyst    Pneumonia yrs ago   "walking pneumonia"   Prostate cancer ([place])    Stroke ([place]) yrs ago   no current neurlogist   Tubular adenoma of colon    Wears contact lenses    Wears dentures    upper full lower partial    Social History   Tobacco Use   Smoking status: Every Day    Packs/day: 0.50    Years: 35.00    Total pack years: 17.50    Types: Cigarettes   Smokeless tobacco: Never   Tobacco comments:    slowing down  Vaping Use   Vaping Use: Never used  Substance Use Topics   Alcohol use: Not Currently   Drug use: Yes    Frequency: 7.0 times per week    Types: Marijuana    Comment: weekly    Family History  Problem Relation Age of Onset   Arthritis Mother    Heart disease Mother    Hypertension Mother    Diabetes Mother    Arthritis Father    Heart disease Father    Hypertension Father    Cancer Father    Lung cancer Brother    Prostate cancer Brother    Prostate cancer Brother    Colon cancer Neg Hx    Pancreatic cancer Neg Hx    Breast cancer Neg Hx    Esophageal cancer Neg Hx    Rectal cancer Neg Hx    Ovarian cancer Neg Hx    Stomach cancer Neg Hx     Allergies  Allergen Reactions   Asa [Aspirin] Palpitations    Speeds up heart rate   Sulfamethoxazole-Trimethoprim Itching    Health Maintenance  Topic Date Due   Zoster Vaccines- Shingrix (1 of 2) Never done   COVID-19 Vaccine (3 - Pfizer risk series) 01/24/2020   INFLUENZA VACCINE  03/18/2022   COLONOSCOPY (Pts 45-54yr Insurance coverage will need to be confirmed)  01/08/2023   TETANUS/TDAP  12/13/2025   Hepatitis C Screening  Completed   HIV Screening  Completed   HPV VACCINES  Aged Out    Objective:  Vitals:   04/17/22 1129  BP: 134/77  Pulse: (!) 52  Resp: 16  SpO2: 98%  Weight: 103 lb (46.7 kg)  Height: '5\' 4"'$  (1.626 m)   Body mass index is 17.68 kg/m.  Physical Exam Constitutional:      Comments: He has lost 10 to 15 pounds  over the last year unintentionally.  Cardiovascular:     Rate and Rhythm: Normal rate and regular rhythm.     Heart sounds: No murmur heard. Pulmonary:     Effort:  Pulmonary effort is normal.     Breath sounds: Normal breath sounds.  Psychiatric:        Mood and Affect: Mood normal.     Lab Results Lab Results  Component Value Date   WBC 7.4 03/10/2022   HGB 11.1 (L) 03/10/2022   HCT 33.0 (L) 03/10/2022   MCV 101.5 (H) 03/10/2022   PLT 313 03/10/2022    Lab Results  Component Value Date   CREATININE 1.10 03/10/2022   BUN 14 03/10/2022   NA 140 03/10/2022   K 4.4 03/10/2022   CL 107 03/10/2022   CO2 24 03/10/2022    Lab Results  Component Value Date   ALT 10 10/30/2021   AST 20 10/30/2021   ALKPHOS 51 10/30/2021   BILITOT 0.4 10/30/2021    Lab Results  Component Value Date   CHOL 138 10/30/2021   HDL 62.40 10/30/2021   LDLCALC 53 10/30/2021   TRIG 114.0 10/30/2021   CHOLHDL 2 10/30/2021   Lab Results  Component Value Date   LABRPR NON-REACTIVE 04/16/2021   HIV 1 RNA Quant  Date Value  04/16/2021 Not Detected Copies/mL  04/17/2020 <20 Copies/mL  10/20/2019 <20 NOT DETECTED copies/mL   CD4 T Cell Abs (/uL)  Date Value  04/16/2021 644  04/17/2020 579  10/20/2019 757     Problem List Items Addressed This Visit       Medium    Human immunodeficiency virus (HIV) disease ([place])    His infection has been under excellent, long-term control.  He will get repeat lab work today, continue Airline pilot and [name] and follow-up in 1 year.      Relevant Medications   bictegravir-emtricitabine-tenofovir AF (BIKTARVY) 50-200-25 MG TABS tablet   darunavir-cobicistat ([name]) 800-150 MG tablet   Other Relevant Orders   T-helper cells (CD4) count (not at [facility])   HIV-1 RNA quant-no reflex-bld   RPR     Unprioritized   Unintentional weight loss    None not absolutely certain but his recent weight loss may be related to a decrease in alcohol intake.  He does  not feel like he is continuing to lose.      Depression    His depression is in remission.         Michel Bickers, MD [facility] [place] [facility] [phone] pager   [phone] cell 04/17/2022, 11:48 AM

## 2022-04-17 NOTE — Assessment & Plan Note (Signed)
None not absolutely certain but his recent weight loss may be related to a decrease in alcohol intake.  He does not feel like he is continuing to lose.

## 2022-04-17 NOTE — Assessment & Plan Note (Signed)
His infection has been under excellent, long-term control.  He will get repeat lab work today, continue Airline pilot and [name] and follow-up in 1 year.

## 2022-04-17 NOTE — Assessment & Plan Note (Signed)
His depression is in remission. 

## 2022-04-18 LAB — T-HELPER CELLS (CD4) COUNT (NOT AT ARMC)
CD4 % Helper T Cell: 32 % — ABNORMAL LOW (ref 33–65)
CD4 T Cell Abs: 703 /uL (ref 400–1790)

## 2022-04-21 LAB — RPR: RPR Ser Ql: NONREACTIVE

## 2022-04-21 LAB — HIV-1 RNA QUANT-NO REFLEX-BLD
HIV 1 RNA Quant: <20 Copies/mL — ABNORMAL HIGH
HIV-1 RNA Quant, Log: <1.3 Log cps/mL — ABNORMAL HIGH

## 2022-04-22 ENCOUNTER — Other Ambulatory Visit: Payer: Self-pay

## 2022-04-22 MED ORDER — TRAZODONE HCL 100 MG PO TABS: 100.0000 mg | ORAL_TABLET | Freq: Every day | ORAL | 0 refills | Status: DC

## 2022-04-25 ENCOUNTER — Other Ambulatory Visit: Payer: Self-pay | Admitting: Internal Medicine

## 2022-04-25 ENCOUNTER — Other Ambulatory Visit (HOSPITAL_COMMUNITY): Payer: Self-pay

## 2022-04-25 DIAGNOSIS — B20 Human immunodeficiency virus [HIV] disease: Secondary | ICD-10-CM

## 2022-04-25 MED ORDER — BICTEGRAVIR-EMTRICITAB-TENOFOV 50-200-25 MG PO TABS
ORAL_TABLET | ORAL | 11 refills | Status: DC
  Filled 2022-04-25: qty 30, 30d supply, fill #0
  Filled 2022-05-16: qty 30, 30d supply, fill #1
  Filled 2022-06-16: qty 30, 30d supply, fill #2
  Filled 2022-07-09: qty 30, 30d supply, fill #3
  Filled 2022-08-21: qty 30, 30d supply, fill #4
  Filled 2022-09-12: qty 30, 30d supply, fill #5
  Filled 2022-10-20: qty 30, 30d supply, fill #6
  Filled 2022-11-13: qty 30, 30d supply, fill #7
  Filled 2022-12-11: qty 30, 30d supply, fill #8
  Filled 2023-01-08: qty 30, 30d supply, fill #9
  Filled 2023-01-29: qty 30, 30d supply, fill #10
  Filled 2023-03-17: qty 30, 30d supply, fill #11

## 2022-04-25 MED ORDER — DARUNAVIR-COBICISTAT 800-150 MG PO TABS
ORAL_TABLET | ORAL | 11 refills | Status: DC
  Filled 2022-04-25: qty 30, 30d supply, fill #0
  Filled 2022-05-16: qty 30, 30d supply, fill #1
  Filled 2022-06-16: qty 30, 30d supply, fill #2
  Filled 2022-07-09: qty 30, 30d supply, fill #3
  Filled 2022-08-21: qty 30, 30d supply, fill #4
  Filled 2022-09-12: qty 30, 30d supply, fill #5
  Filled 2022-10-20: qty 30, 30d supply, fill #6
  Filled 2022-11-13: qty 30, 30d supply, fill #7
  Filled 2022-12-11: qty 30, 30d supply, fill #8
  Filled 2023-01-08: qty 30, 30d supply, fill #9
  Filled 2023-01-29: qty 30, 30d supply, fill #10
  Filled 2023-03-17: qty 30, 30d supply, fill #11

## 2022-05-01 ENCOUNTER — Telehealth: Payer: Self-pay | Admitting: Family Medicine

## 2022-05-01 NOTE — Telephone Encounter (Signed)
Caller name:  Yoniel   On DPR? :yes/no: Yes  Call back number: [phone]  Provider they see: Birdie Riddle  Reason for call: pt calling b/c he is wondering if he still needs to be taking famotidine (I don't see it in meds list).  Please advise.

## 2022-05-06 ENCOUNTER — Ambulatory Visit (HOSPITAL_COMMUNITY)
Admission: RE | Admit: 2022-05-06 | Discharge: 2022-05-06 | Disposition: A | Discharge status: Home / Self Care | Payer: Commercial Managed Care - HMO | Source: Ambulatory Visit | Attending: Internal Medicine | Admitting: Internal Medicine

## 2022-05-06 ENCOUNTER — Other Ambulatory Visit: Payer: Self-pay | Admitting: Internal Medicine

## 2022-05-06 DIAGNOSIS — K862 Cyst of pancreas: Secondary | ICD-10-CM

## 2022-05-06 MED ORDER — GADOBUTROL 1 MMOL/ML IV SOLN
5.0000 mL | Freq: Once | INTRAVENOUS | Status: AC | PRN
  Administered 2022-05-06: 5 mL via INTRAVENOUS

## 2022-05-08 NOTE — Telephone Encounter (Signed)
According to the chart, this was stopped when you were discharged from the hospital in July.  You can finish what you have and then stop taking it

## 2022-05-08 NOTE — Telephone Encounter (Signed)
Pt called back about this medication. Pt states only has five pill left. Pt needs a refill to be sent to [facility] [phone] - [place], [address].

## 2022-05-09 NOTE — Telephone Encounter (Signed)
Called back patient and advised him of this he voiced understanding

## 2022-05-12 ENCOUNTER — Other Ambulatory Visit: Payer: Self-pay

## 2022-05-12 ENCOUNTER — Ambulatory Visit (INDEPENDENT_AMBULATORY_CARE_PROVIDER_SITE_OTHER): Payer: Commercial Managed Care - HMO

## 2022-05-12 DIAGNOSIS — Z23 Encounter for immunization: Secondary | ICD-10-CM

## 2022-05-12 DIAGNOSIS — K862 Cyst of pancreas: Secondary | ICD-10-CM

## 2022-05-12 MED ORDER — FAMOTIDINE 40 MG PO TABS: 40.0000 mg | ORAL_TABLET | Freq: Every day | ORAL | 1 refills | Status: DC

## 2022-05-13 ENCOUNTER — Other Ambulatory Visit: Payer: Self-pay

## 2022-05-13 DIAGNOSIS — K862 Cyst of pancreas: Secondary | ICD-10-CM

## 2022-05-16 ENCOUNTER — Other Ambulatory Visit (HOSPITAL_COMMUNITY): Payer: Self-pay

## 2022-05-19 ENCOUNTER — Other Ambulatory Visit (INDEPENDENT_AMBULATORY_CARE_PROVIDER_SITE_OTHER): Payer: Commercial Managed Care - HMO

## 2022-05-19 DIAGNOSIS — K862 Cyst of pancreas: Secondary | ICD-10-CM

## 2022-05-19 LAB — COMPREHENSIVE METABOLIC PANEL
ALT: 13 U/L (ref 0–53)
AST: 14 U/L (ref 0–37)
Albumin: 4.2 g/dL (ref 3.5–5.2)
Alkaline Phosphatase: 70 U/L (ref 39–117)
BUN: 12 mg/dL (ref 6–23)
CO2: 24 mEq/L (ref 19–32)
Calcium: 9.4 mg/dL (ref 8.4–10.5)
Chloride: 107 mEq/L (ref 96–112)
Creatinine, Ser: 1.14 mg/dL (ref 0.40–1.50)
GFR: 68.21 mL/min (ref >60.00)
Glucose, Bld: 118 mg/dL — ABNORMAL HIGH (ref 70–99)
Potassium: 3.9 mEq/L (ref 3.5–5.1)
Sodium: 139 mEq/L (ref 135–145)
Total Bilirubin: 0.3 mg/dL (ref 0.2–1.2)
Total Protein: 7.5 g/dL (ref 6.0–8.3)

## 2022-05-20 LAB — CANCER ANTIGEN 19-9: CA 19-9: 40 U/mL — ABNORMAL HIGH (ref <34)

## 2022-05-22 ENCOUNTER — Other Ambulatory Visit (HOSPITAL_COMMUNITY): Payer: Self-pay

## 2022-05-22 ENCOUNTER — Encounter (HOSPITAL_COMMUNITY): Payer: Self-pay | Admitting: Gastroenterology

## 2022-05-23 NOTE — Progress Notes (Signed)
Attempted to obtain medical history via telephone, unable to reach at this time. HIPAA compliant voicemail message left requesting return call to pre surgical testing department. 

## 2022-06-02 ENCOUNTER — Encounter (HOSPITAL_COMMUNITY)
Admission: RE | Disposition: A | Discharge status: Home / Self Care | Payer: Self-pay | Source: Ambulatory Visit | Attending: Gastroenterology

## 2022-06-02 ENCOUNTER — Encounter (HOSPITAL_COMMUNITY): Payer: Self-pay | Admitting: Gastroenterology

## 2022-06-02 ENCOUNTER — Ambulatory Visit (HOSPITAL_BASED_OUTPATIENT_CLINIC_OR_DEPARTMENT_OTHER): Payer: Commercial Managed Care - HMO | Admitting: Anesthesiology

## 2022-06-02 ENCOUNTER — Ambulatory Visit (HOSPITAL_COMMUNITY): Payer: Commercial Managed Care - HMO | Admitting: Anesthesiology

## 2022-06-02 ENCOUNTER — Other Ambulatory Visit: Payer: Self-pay

## 2022-06-02 ENCOUNTER — Ambulatory Visit (HOSPITAL_COMMUNITY)
Admission: RE | Admit: 2022-06-02 | Discharge: 2022-06-02 | Disposition: A | Discharge status: Home / Self Care | Payer: Commercial Managed Care - HMO | Source: Ambulatory Visit | Attending: Gastroenterology | Admitting: Gastroenterology

## 2022-06-02 DIAGNOSIS — Z21 Asymptomatic human immunodeficiency virus [HIV] infection status: Secondary | ICD-10-CM | POA: Diagnosis not present

## 2022-06-02 DIAGNOSIS — K219 Gastro-esophageal reflux disease without esophagitis: Secondary | ICD-10-CM | POA: Insufficient documentation

## 2022-06-02 DIAGNOSIS — I10 Essential (primary) hypertension: Secondary | ICD-10-CM | POA: Insufficient documentation

## 2022-06-02 DIAGNOSIS — K222 Esophageal obstruction: Secondary | ICD-10-CM | POA: Diagnosis not present

## 2022-06-02 DIAGNOSIS — K2970 Gastritis, unspecified, without bleeding: Secondary | ICD-10-CM | POA: Insufficient documentation

## 2022-06-02 DIAGNOSIS — M879 Osteonecrosis, unspecified: Secondary | ICD-10-CM | POA: Diagnosis not present

## 2022-06-02 DIAGNOSIS — K449 Diaphragmatic hernia without obstruction or gangrene: Secondary | ICD-10-CM | POA: Diagnosis not present

## 2022-06-02 DIAGNOSIS — K862 Cyst of pancreas: Secondary | ICD-10-CM | POA: Diagnosis present

## 2022-06-02 DIAGNOSIS — Z8673 Personal history of transient ischemic attack (TIA), and cerebral infarction without residual deficits: Secondary | ICD-10-CM

## 2022-06-02 DIAGNOSIS — K859 Acute pancreatitis without necrosis or infection, unspecified: Secondary | ICD-10-CM | POA: Insufficient documentation

## 2022-06-02 HISTORY — PX: EUS: SHX5427

## 2022-06-02 HISTORY — PX: ESOPHAGOGASTRODUODENOSCOPY: SHX5428

## 2022-06-02 HISTORY — PX: FINE NEEDLE ASPIRATION: SHX5430

## 2022-06-02 HISTORY — PX: BIOPSY: SHX5522

## 2022-06-02 SURGERY — UPPER ENDOSCOPIC ULTRASOUND (EUS) RADIAL
Anesthesia: Monitor Anesthesia Care

## 2022-06-02 MED ORDER — LACTATED RINGERS IV SOLN
INTRAVENOUS | Status: AC | PRN
  Administered 2022-06-02: 20 mL/h via INTRAVENOUS

## 2022-06-02 MED ORDER — CLOPIDOGREL BISULFATE 75 MG PO TABS: 75.0000 mg | ORAL_TABLET | Freq: Every day | ORAL | 0 refills | Status: DC

## 2022-06-02 MED ORDER — EPHEDRINE SULFATE-NACL 50-0.9 MG/10ML-% IV SOSY
PREFILLED_SYRINGE | INTRAVENOUS | Status: DC | PRN
  Administered 2022-06-02 (×2): 5 mg via INTRAVENOUS

## 2022-06-02 MED ORDER — OMEPRAZOLE MAGNESIUM 20 MG PO TBEC: 20.0000 mg | DELAYED_RELEASE_TABLET | Freq: Two times a day (BID) | ORAL | 6 refills | Status: DC

## 2022-06-02 MED ORDER — DEXMEDETOMIDINE HCL IN NACL 80 MCG/20ML IV SOLN
INTRAVENOUS | Status: DC | PRN
  Administered 2022-06-02 (×3): 4 ug via BUCCAL

## 2022-06-02 MED ORDER — CIPROFLOXACIN IN D5W 400 MG/200ML IV SOLN
INTRAVENOUS | Status: AC
  Filled 2022-06-02: qty 200

## 2022-06-02 MED ORDER — LIDOCAINE 2% (20 MG/ML) 5 ML SYRINGE
INTRAMUSCULAR | Status: DC | PRN
  Administered 2022-06-02: 100 mg via INTRAVENOUS

## 2022-06-02 MED ORDER — CIPROFLOXACIN HCL 500 MG PO TABS: 500.0000 mg | ORAL_TABLET | Freq: Two times a day (BID) | ORAL | 0 refills | Status: AC

## 2022-06-02 MED ORDER — PROPOFOL 500 MG/50ML IV EMUL
INTRAVENOUS | Status: DC | PRN
  Administered 2022-06-02: 100 ug/kg/min via INTRAVENOUS

## 2022-06-02 MED ORDER — PROPOFOL 1000 MG/100ML IV EMUL
INTRAVENOUS | Status: AC
  Filled 2022-06-02: qty 100

## 2022-06-02 MED ORDER — PROPOFOL 10 MG/ML IV BOLUS
INTRAVENOUS | Status: DC | PRN
  Administered 2022-06-02: 50 mg via INTRAVENOUS
  Administered 2022-06-02 (×2): 20 mg via INTRAVENOUS
  Administered 2022-06-02: 50 mg via INTRAVENOUS

## 2022-06-02 MED ORDER — ONDANSETRON HCL 4 MG/2ML IJ SOLN
INTRAMUSCULAR | Status: DC | PRN
  Administered 2022-06-02: 4 mg via INTRAVENOUS

## 2022-06-02 MED ORDER — GLYCOPYRROLATE PF 0.2 MG/ML IJ SOSY
PREFILLED_SYRINGE | INTRAMUSCULAR | Status: DC | PRN
  Administered 2022-06-02 (×2): .1 mg via INTRAVENOUS

## 2022-06-02 MED ORDER — CIPROFLOXACIN IN D5W 400 MG/200ML IV SOLN
INTRAVENOUS | Status: DC | PRN
  Administered 2022-06-02: 400 mg via INTRAVENOUS

## 2022-06-02 NOTE — Anesthesia Procedure Notes (Signed)
Procedure Name: MAC Date/Time: 06/02/2022 9:28 AM  Performed by: Deliah Boston, CRNAPre-anesthesia Checklist: Patient identified, Emergency Drugs available, Suction available and Patient being monitored Patient Re-evaluated:Patient Re-evaluated prior to induction Oxygen Delivery Method: Simple face mask Preoxygenation: Pre-oxygenation with 100% oxygen Placement Confirmation: positive ETCO2 and breath sounds checked- equal and bilateral

## 2022-06-02 NOTE — H&P (Signed)
GASTROENTEROLOGY PROCEDURE H&P NOTE   Primary Care Physician: Midge Minium, MD  HPI: Daniel Gonzalez is a 66 y.o. male who presents for EGD/EUS to evaluate pancreatic lesion in the head/uncinate query groove pancreatitis versus cyst versus mass with slightly elevated CA 19-9.  Past Medical History:  Diagnosis Date   Anal fistula    Aortic atherosclerosis (HCC)    Diverticulosis    GERD (gastroesophageal reflux disease)    Hiatal hernia    HIV infection ([place])    Hypertension    Malignant neoplasm of prostate ([place]) 06/19/2020   Pancreatic cyst    Pneumonia yrs ago   "walking pneumonia"   Prostate cancer ([place])    Stroke ([place]) yrs ago   no current neurlogist   Tubular adenoma of colon    Wears contact lenses    Wears dentures    upper full lower partial   Past Surgical History:  Procedure Laterality Date   ? seton placement 10 years ago      COLONOSCOPY     cyst in back     removed from back   [place] N/A 09/10/2020   Procedure: RADIOACTIVE SEED IMPLANT/BRACHYTHERAPY IMPLANT;  Surgeon: Lucas Mallow, MD;  Location: [facility];  Service: Urology;  Laterality: N/A;   SPACE OAR INSTILLATION N/A 09/10/2020   Procedure: SPACE OAR INSTILLATION;  Surgeon: Lucas Mallow, MD;  Location: [facility];  Service: Urology;  Laterality: N/A;   TEE WITHOUT CARDIOVERSION N/A 06/24/2013   Procedure: TRANSESOPHAGEAL ECHOCARDIOGRAM (TEE);  Surgeon: Jolaine Artist, MD;  Location: [facility] ENDOSCOPY;  Service: Cardiovascular;  Laterality: N/A;   Current Facility-Administered Medications  Medication Dose Route Frequency Provider Last Rate Last Admin   lactated ringers infusion    Continuous PRN Mansouraty, Telford Nab., MD 20 mL/hr at 06/02/22 0731 20 mL/hr at 06/02/22 0731    Current Facility-Administered Medications:    lactated ringers infusion, , , Continuous PRN, Mansouraty, Telford Nab., MD, Last  Rate: 20 mL/hr at 06/02/22 0731, 20 mL/hr at 06/02/22 0731 Allergies  Allergen Reactions   Asa [Aspirin] Palpitations    Speeds up heart rate   Sulfamethoxazole-Trimethoprim Itching    Bactrim*   Family History  Problem Relation Age of Onset   Arthritis Mother    Heart disease Mother    Hypertension Mother    Diabetes Mother    Arthritis Father    Heart disease Father    Hypertension Father    Cancer Father    Lung cancer Brother    Prostate cancer Brother    Prostate cancer Brother    Colon cancer Neg Hx    Pancreatic cancer Neg Hx    Breast cancer Neg Hx    Esophageal cancer Neg Hx    Rectal cancer Neg Hx    Ovarian cancer Neg Hx    Stomach cancer Neg Hx    Social History   Socioeconomic History   Marital status: Divorced    Spouse name: Not on file   Number of children: 3   Years of education: HS   Highest education level: Not on file  Occupational History   Occupation: Firefighter: [employer]  Tobacco Use   Smoking status: Every Day    Packs/day: 0.50    Years: 35.00    Total pack years: 17.50    Types: Cigarettes   Smokeless tobacco: Never  Tobacco comments:    slowing down  Vaping Use   Vaping Use: Never used  Substance and Sexual Activity   Alcohol use: Not Currently   Drug use: Yes    Frequency: 7.0 times per week    Types: Marijuana    Comment: weekly   Sexual activity: Yes    Partners: Female    Birth control/protection: Condom    Comment: declined condoms 03/2021  Other Topics Concern   Not on file  Social History Narrative   Patient lives at home alone.   Caffeine Use: 1 cup occasionally   Social Determinants of Health   Financial Resource Strain: Not on file  Food Insecurity: Not on file  Transportation Needs: Not on file  Physical Activity: Not on file  Stress: Not on file  Social Connections: Not on file  Intimate Partner Violence: Not on file    Physical Exam: Today's Vitals   06/02/22 0713  BP: 122/76   Pulse: (!) 51  Temp: 97.6 F (36.4 C)  TempSrc: Oral  SpO2: 100%  Weight: 48.5 kg  Height: '5\' 4"'$  (1.626 m)  PainSc: 2    Body mass index is 18.37 kg/m. GEN: NAD EYE: Sclerae anicteric ENT: MMM CV: Non-tachycardic GI: Soft, NT/ND NEURO:  Alert & Oriented x 3  Lab Results: No results for input(s): "WBC", "HGB", "HCT", "PLT" in the last 72 hours. BMET No results for input(s): "NA", "K", "CL", "CO2", "GLUCOSE", "BUN", "CREATININE", "CALCIUM" in the last 72 hours. LFT No results for input(s): "PROT", "ALBUMIN", "AST", "ALT", "ALKPHOS", "BILITOT", "BILIDIR", "IBILI" in the last 72 hours. PT/INR No results for input(s): "LABPROT", "INR" in the last 72 hours.   Impression / Plan: This is a 66 y.o.male who presents for EGD/EUS to evaluate pancreatic lesion in the head/uncinate query groove pancreatitis versus cyst versus mass with slightly elevated CA 19-9.  The risks of an EUS including intestinal perforation, bleeding, infection, aspiration, and medication effects were discussed as was the possibility it may not give a definitive diagnosis if a biopsy is performed.  When a biopsy of the pancreas is done as part of the EUS, there is an additional risk of pancreatitis at the rate of about 1-2%.  It was explained that procedure related pancreatitis is typically mild, although it can be severe and even life threatening, which is why we do not perform random pancreatic biopsies and only biopsy a lesion/area we feel is concerning enough to warrant the risk.  The risks and benefits of endoscopic evaluation/treatment were discussed with the patient and/or family; these include but are not limited to the risk of perforation, infection, bleeding, missed lesions, lack of diagnosis, severe illness requiring hospitalization, as well as anesthesia and sedation related illnesses.  The patient's history has been reviewed, patient examined, no change in status, and deemed stable for procedure.  The  patient and/or family is agreeable to proceed.    [name], MD [facility]astro[facility] Advanced Endoscopy Office # [phone]

## 2022-06-02 NOTE — Op Note (Addendum)
[facility] Patient Name: Daniel Gonzalez Procedure Date: 06/02/2022 MRN: [id number] Attending MD: [name] , MD Date of Birth: 1956/01/12 CSN: [id number] Age: 66 Admit Type: Outpatient Procedure:                Upper EUS Indications:              Pancreatic cyst on MRI, Abnormal abdominal MRI,                            Exclusion of chronic pancreatitis, Acute                            pancreatitis Providers:                [name], MD, Burtis Junes, RN, Cletis Athens,                            Technician Referring MD:             Lajuan Lines. Hilarie Fredrickson, MD, Aundra Millet. Birdie Riddle, MD Medicines:                Monitored Anesthesia Care, Cipro 301 mg IV Complications:            No immediate complications. Estimated Blood Loss:     Estimated blood loss was minimal. Procedure:                Pre-Anesthesia Assessment:                           - Prior to the procedure, a History and Physical                            was performed, and patient medications and                            allergies were reviewed. The patient's tolerance of                            previous anesthesia was also reviewed. The risks                            and benefits of the procedure and the sedation                            options and risks were discussed with the patient.                            All questions were answered, and informed consent                            was obtained. Prior Anticoagulants: The patient has                            taken Plavix (clopidogrel), last dose was 5 days  prior to procedure. ASA Grade Assessment: III - A                            patient with severe systemic disease. After                            reviewing the risks and benefits, the patient was                            deemed in satisfactory condition to undergo the                            procedure.                           After obtaining  informed consent, the endoscope was                            passed under direct vision. Throughout the                            procedure, the patient's blood pressure, pulse, and                            oxygen saturations were monitored continuously. The                            [id number]-H190 ([id number]) Olympus endoscope was introduced                            through the mouth, and advanced to the second part                            of duodenum. The [id number]-Q190V ([id number]) Olympus                            duodenoscope was introduced through the mouth, and                            advanced to the area of papilla. The [id number]                            ([id number]) Olympus linear ultrasound scope was                            introduced through the mouth, and advanced to the                            duodenum for ultrasound examination from the                            stomach and duodenum. The upper EUS was  accomplished without difficulty. The patient                            tolerated the procedure. Scope In: Scope Out: Findings:      ENDOSCOPIC FINDING: :      No gross lesions were noted in the entire esophagus.      A widely patent Schatzki ring was found at the gastroesophageal junction.      The Z-line was regular and was found 35 cm from the incisors.      A 3 cm hiatal hernia was present.      Patchy mild inflammation characterized by erosions and erythema was       found in the entire examined stomach. Biopsies were taken with a cold       forceps for histology and Helicobacter pylori testing.      Diffuse moderate mucosal changes characterized by congestion,       inflammation and altered texture were found in the periampullary region       and minor papillary region. Biopsies were taken with a cold forceps for       histology.      Mucosal changes of congestion were found at the major papilla.      ENDOSONOGRAPHIC FINDING: :      An  anechoic lesion suggestive of a cyst was identified in the       peripancreatic head region. It does not communicate with the pancreatic       duct and actually looks to be intramural of the duodenum. The lesion       measured 17 mm by 11 mm in maximal cross-sectional diameter. There was a       single compartment without septae. The outer wall of the lesion was       thin. There was no associated mass. There was no internal debris within       the fluid-filled cavity. Diagnostic needle aspiration for fluid was       performed. Color Doppler imaging was utilized prior to needle puncture       to confirm a lack of significant vascular structures within the needle       path. One pass was made with the expect 22 gauge needle using a       transduodenal approach. A stylet was used. The amount of fluid collected       was 2 mL. The fluid was clear, white and thin. Sample(s) were sent for       amylase concentration, cytology, CEA, glucose analysis (GNAS mutation       analysis and KRAS mutation analysis will be performed if thresholds are       met).      An anechoic lesion suggestive of a cyst was identified in the pancreatic       head. It is not in obvious communication with the pancreatic duct. The       lesion measured 11 mm by 11 mm in maximal cross-sectional diameter.       There was a single compartment without septae. The outer wall of the       lesion was thin. There was no associated mass. There was internal debris       within the fluid-filled cavity. Diagnostic needle aspiration for fluid       was performed. Color Doppler imaging was utilized prior to needle  puncture to confirm a lack of significant vascular structures within the       needle path. One pass was made with the expect 22 gauge needle using a       transduodenal approach. A stylet was used. The amount of fluid collected       was 0.5 mL. The fluid was serosanguinous. Sample was sent for cytology.      Pancreatic  parenchymal abnormalities were noted in the entire pancreas.       These consisted of lobularity with honeycombing, cysts and hyperechoic       strands.      The pancreatic duct measured in the pancreatic head (2.2 mm), genu of       the pancreas (2.8 mm), body of the pancreas (2.0 mm) and tail of the       pancreas (1.1 mm).      There was no sign of significant endosonographic abnormality in the       common bile duct and in the common hepatic duct. An unremarkable       gallbladder was identified.      Endosonographic imaging of the ampulla showed no intramural       (subepithelial) lesion.      Diffuse wall thickening was visualized endosonographically in the apex       of the duodenal bulb and in the second portion of the duodenum. This       appeared to primarily be due to thickening within the luminal       interface/superficial mucosa (Layer 1) and deep mucosa (Layer 2). The       thickness of the abnormal layers.      Endosonographic imaging in the visualized portion of the liver showed no       mass.      No malignant-appearing lymph nodes were visualized in the celiac region       (level 20), peripancreatic region and porta hepatis region.      The celiac region was visualized. Impression:               EGD impression:                           - No gross lesions in esophagus. Widely patent                            Schatzki ring. Z-line regular, 35 cm from the                            incisors.                           - 3 cm hiatal hernia.                           - Gastritis. Biopsied.                           - Mucosal changes in periampullary and                            periminor-papillary regions. Biopsied. Concerning  for potential groove pancreatitis/inflammation                           - Congestion at the major ampulla was noted.                           EUS impression:                           - A cystic lesion was seen in  the peripancreatic                            head region. This actually appeared to be                            intramural from the duodenum. Cytology results are                            pending. However, the endosonographic appearance is                            suggestive of a pancreatic intramural pseudocyst.                            Fine needle aspiration for fluid performed.                           - A cystic lesion was seen in the pancreatic head                            with some mild debris. Cytology results are                            pending. However, the endosonographic appearance is                            suggestive of a pancreatic pseudocyst/WON. Fine                            needle aspiration for fluid performed.                           - Pancreatic parenchymal abnormalities consisting                            of lobularity with honeycombing, cysts and                            hyperechoic strands were noted in the entire                            pancreas. I did not see an overt mass or lesion in  the head of pancreas.                           - The pancreatic duct in the pancreatic head, genu                            of the pancreas, body of the pancreas and tail of                            the pancreas.                           - There was no sign of significant pathology in the                            common bile duct and in the common hepatic duct.                           - Wall thickening was seen in the apex of the                            duodenal bulb and in the second portion of the                            duodenum. It appeared to primarily be within the                            luminal interface/superficial mucosa (Layer 1) and                            deep mucosa (Layer 2). 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                            - No malignant-appearing lymph nodes were                            visualized in the celiac region (level 20),                            peripancreatic region and porta hepatis region. Moderate Sedation:      Not Applicable - Patient had care per Anesthesia. Recommendation:           - The patient will be observed post-procedure,                            until all discharge criteria are met.                           - Discharge patient to home.                           -  Patient has a contact number available for                            emergencies. The signs and symptoms of potential                            delayed complications were discussed with the                            patient. Return to normal activities tomorrow.                            Written discharge instructions were provided to the                            patient.                           - Low fat diet for 2 weeks.                           - Ciprofloxacin 500 mg twice daily x3 days.                           - Increase to omeprazole 20 mg twice daily.                           - Plavix to restart on 10/18 AM (48 hours).                           - Await cytology results, await path results and                            await tumor markers.                           - Based on results will need to consider additional                            imaging for surveillance/follow-up as well as                            repeat CA 19?9 levels.                           - The findings and recommendations were discussed                            with the patient.                           - The findings and recommendations were discussed                            with the patient's family. Procedure Code(s):        ---  Professional ---                           [phone], Esophagogastroduodenoscopy, flexible,                            transoral; with transendoscopic ultrasound-guided                             intramural or transmural fine needle                            aspiration/biopsy(s), (includes endoscopic                            ultrasound examination limited to the esophagus,                            stomach or duodenum, and adjacent structures) Diagnosis Code(s):        --- Professional ---                           K22.2, Esophageal obstruction                           K44.9, Diaphragmatic hernia without obstruction or                            gangrene                           K29.70, Gastritis, unspecified, without bleeding                           K31.89, Other diseases of stomach and duodenum                           K86.2, Cyst of pancreas                           K86.9, Disease of pancreas, unspecified                           I89.9, Noninfective disorder of lymphatic vessels                            and lymph nodes, unspecified                           K85.90, Acute pancreatitis without necrosis or                            infection, unspecified                           R93.5, Abnormal findings on diagnostic imaging of  other abdominal regions, including retroperitoneum CPT copyright 2019 American [employer] Association. All rights reserved. The codes documented in this report are preliminary and upon coder review may  be revised to meet current compliance requirements. [name], MD 06/02/2022 10:41:08 AM Number of Addenda: 0

## 2022-06-02 NOTE — Discharge Instructions (Signed)

## 2022-06-02 NOTE — Anesthesia Postprocedure Evaluation (Signed)
Anesthesia Post Note  Patient: Daniel Gonzalez  Procedure(s) Performed: UPPER ENDOSCOPIC ULTRASOUND (EUS) RADIAL ESOPHAGOGASTRODUODENOSCOPY (EGD) BIOPSY FINE NEEDLE ASPIRATION (FNA) LINEAR     Patient location during evaluation: Endoscopy Anesthesia Type: MAC Level of consciousness: awake and alert, patient cooperative and oriented Pain management: pain level controlled Vital Signs Assessment: post-procedure vital signs reviewed and stable Respiratory status: nonlabored ventilation, spontaneous breathing and respiratory function stable Cardiovascular status: blood pressure returned to baseline and stable Postop Assessment: no apparent nausea or vomiting Anesthetic complications: no   No notable events documented.  Last Vitals:  Vitals:   06/02/22 0713 06/02/22 1025  BP: 122/76 101/63  Pulse: (!) 51 (!) 59  Resp:  (!) 25  Temp: 36.4 C 36.6 C  SpO2: 100% 100%    Last Pain:  Vitals:   06/02/22 1025  TempSrc: Oral  PainSc: 0-No pain                 Dafna Romo,E. Read Bonelli

## 2022-06-02 NOTE — Anesthesia Preprocedure Evaluation (Addendum)
Anesthesia Evaluation  Patient identified by MRN, date of birth, ID band Patient awake    Reviewed: Allergy & Precautions, NPO status , Patient's Chart, lab work & pertinent test results, reviewed documented beta blocker date and time   History of Anesthesia Complications Negative for: history of anesthetic complications  Airway Mallampati: I  TM Distance: >3 FB Neck ROM: Full    Dental  (+) Edentulous Upper, Partial Lower   Pulmonary Current SmokerPatient did not abstain from smoking.,    breath sounds clear to auscultation       Cardiovascular hypertension, Pt. on medications and Pt. on home beta blockers (-) angina Rhythm:Regular Rate:Normal     Neuro/Psych Depression CVA, No Residual Symptoms    GI/Hepatic GERD  Medicated and Controlled,(+)     substance abuse  marijuana use,   Endo/Other  negative endocrine ROS  Renal/GU negative Renal ROS   Prostate cancer    Musculoskeletal   Abdominal   Peds  Hematology  (+) HIV,   Anesthesia Other Findings   Reproductive/Obstetrics                            Anesthesia Physical Anesthesia Plan  ASA: 3  Anesthesia Plan: MAC   Post-op Pain Management: Minimal or no pain anticipated   Induction:   PONV Risk Score and Plan: 0  Airway Management Planned: Natural Airway and Nasal Cannula  Additional Equipment: None  Intra-op Plan:   Post-operative Plan:   Informed Consent: I have reviewed the patients History and Physical, chart, labs and discussed the procedure including the risks, benefits and alternatives for the proposed anesthesia with the patient or authorized representative who has indicated his/her understanding and acceptance.     Dental advisory given  Plan Discussed with: CRNA and Surgeon  Anesthesia Plan Comments:        Anesthesia Quick Evaluation

## 2022-06-02 NOTE — Transfer of Care (Signed)
Immediate Anesthesia Transfer of Care Note  Patient: Daniel Gonzalez  Procedure(s) Performed: Procedure(s): UPPER ENDOSCOPIC ULTRASOUND (EUS) RADIAL (N/A) ESOPHAGOGASTRODUODENOSCOPY (EGD) (N/A) BIOPSY FINE NEEDLE ASPIRATION (FNA) LINEAR (N/A)  Patient Location: PACU  Anesthesia Type:MAC  Level of Consciousness: Patient easily awoken, sedated, comfortable, cooperative, following commands, responds to stimulation.   Airway & Oxygen Therapy: Patient spontaneously breathing, ventilating well, oxygen via simple oxygen mask.  Post-op Assessment: Report given to PACU RN, vital signs reviewed and stable, moving all extremities.   Post vital signs: Reviewed and stable.  Complications: No apparent anesthesia complications  Last Vitals:  Vitals Value Taken Time  BP 101/63 06/02/22 1022  Temp    Pulse 57 06/02/22 1024  Resp 18 06/02/22 1024  SpO2 100 % 06/02/22 1024  Vitals shown include unvalidated device data.  Last Pain:  Vitals:   06/02/22 0713  TempSrc: Oral  PainSc: 2          Complications: No notable events documented.

## 2022-06-03 LAB — SURGICAL PATHOLOGY

## 2022-06-04 ENCOUNTER — Encounter: Payer: Self-pay | Admitting: Gastroenterology

## 2022-06-05 LAB — CYTOLOGY - NON PAP

## 2022-06-06 ENCOUNTER — Telehealth: Payer: Self-pay | Admitting: Gastroenterology

## 2022-06-06 ENCOUNTER — Other Ambulatory Visit: Payer: Self-pay

## 2022-06-06 ENCOUNTER — Encounter: Payer: Self-pay | Admitting: Gastroenterology

## 2022-06-06 DIAGNOSIS — D135 Benign neoplasm of extrahepatic bile ducts: Secondary | ICD-10-CM

## 2022-06-06 NOTE — Progress Notes (Signed)
[facility] laboratories  Pancreatic cyst fluid evaluation CEA = 236 ng/mL Amylase = 29,824 units/L Glucose = 46 mg/dL   Somewhat discordant results in regards to the amylase level which is quite high and would normally suggest a pseudocyst but with the CEA being elevated as well with a low glucose as well a mucinous cystic neoplasm must be considered.  Additional GNAS and KRAS testing has not been reflexed and will be reported in the coming weeks.   Gabriel Mansouraty, MD [place] [facility] Advanced Endoscopy Office # [phone] 

## 2022-06-06 NOTE — Telephone Encounter (Signed)
Good Morning Dr. Rush Landmark, patient was returning your call.

## 2022-06-06 NOTE — Telephone Encounter (Signed)
Called and spoke with patient. See separate surgical pathology result note for further discussions. GM

## 2022-06-07 ENCOUNTER — Encounter (HOSPITAL_COMMUNITY): Payer: Self-pay | Admitting: Gastroenterology

## 2022-06-09 ENCOUNTER — Other Ambulatory Visit: Payer: Self-pay

## 2022-06-09 DIAGNOSIS — Z8673 Personal history of transient ischemic attack (TIA), and cerebral infarction without residual deficits: Secondary | ICD-10-CM

## 2022-06-12 ENCOUNTER — Other Ambulatory Visit (INDEPENDENT_AMBULATORY_CARE_PROVIDER_SITE_OTHER): Payer: Commercial Managed Care - HMO

## 2022-06-12 DIAGNOSIS — D135 Benign neoplasm of extrahepatic bile ducts: Secondary | ICD-10-CM

## 2022-06-12 LAB — COMPREHENSIVE METABOLIC PANEL
ALT: 10 U/L (ref 0–53)
AST: 10 U/L (ref 0–37)
Albumin: 4.2 g/dL (ref 3.5–5.2)
Alkaline Phosphatase: 63 U/L (ref 39–117)
BUN: 13 mg/dL (ref 6–23)
CO2: 24 mEq/L (ref 19–32)
Calcium: 9.4 mg/dL (ref 8.4–10.5)
Chloride: 108 mEq/L (ref 96–112)
Creatinine, Ser: 1.06 mg/dL (ref 0.40–1.50)
GFR: 74.4 mL/min (ref >60.00)
Glucose, Bld: 130 mg/dL — ABNORMAL HIGH (ref 70–99)
Potassium: 3.7 mEq/L (ref 3.5–5.1)
Sodium: 139 mEq/L (ref 135–145)
Total Bilirubin: 0.2 mg/dL (ref 0.2–1.2)
Total Protein: 7.6 g/dL (ref 6.0–8.3)

## 2022-06-12 LAB — CBC WITH DIFFERENTIAL/PLATELET
Basophils Absolute: 0 10*3/uL (ref 0.0–0.1)
Basophils Relative: 0.2 % (ref 0.0–3.0)
Eosinophils Absolute: 0.1 10*3/uL (ref 0.0–0.7)
Eosinophils Relative: 1.5 % (ref 0.0–5.0)
HCT: 34.7 % — ABNORMAL LOW (ref 39.0–52.0)
Hemoglobin: 11.6 g/dL — ABNORMAL LOW (ref 13.0–17.0)
Lymphocytes Relative: 48.1 % — ABNORMAL HIGH (ref 12.0–46.0)
Lymphs Abs: 2.9 10*3/uL (ref 0.7–4.0)
MCHC: 33.3 g/dL (ref 30.0–36.0)
MCV: 96.9 fl (ref 78.0–100.0)
Monocytes Absolute: 0.6 10*3/uL (ref 0.1–1.0)
Monocytes Relative: 9.7 % (ref 3.0–12.0)
Neutro Abs: 2.5 10*3/uL (ref 1.4–7.7)
Neutrophils Relative %: 40.5 % — ABNORMAL LOW (ref 43.0–77.0)
Platelets: 282 10*3/uL (ref 150.0–400.0)
RBC: 3.58 Mil/uL — ABNORMAL LOW (ref 4.22–5.81)
RDW: 14.5 % (ref 11.5–15.5)
WBC: 6.1 10*3/uL (ref 4.0–10.5)

## 2022-06-12 LAB — PROTIME-INR
INR: 1 ratio (ref 0.8–1.0)
Prothrombin Time: 11.1 s (ref 9.6–13.1)

## 2022-06-13 LAB — CANCER ANTIGEN 19-9: CA 19-9: 43 U/mL — ABNORMAL HIGH (ref <34)

## 2022-06-16 ENCOUNTER — Ambulatory Visit (HOSPITAL_COMMUNITY): Payer: Commercial Managed Care - HMO

## 2022-06-16 ENCOUNTER — Other Ambulatory Visit (HOSPITAL_COMMUNITY): Payer: Self-pay

## 2022-06-19 ENCOUNTER — Other Ambulatory Visit (HOSPITAL_COMMUNITY): Payer: Self-pay

## 2022-06-19 NOTE — Progress Notes (Signed)
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  There is molecular alteration but lacks otherwise clinical concerning features.  This is good news in regards to the cyst.  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   [name], MD [facility] Office # [phone]

## 2022-06-24 ENCOUNTER — Other Ambulatory Visit: Payer: Self-pay

## 2022-06-24 DIAGNOSIS — Z8673 Personal history of transient ischemic attack (TIA), and cerebral infarction without residual deficits: Secondary | ICD-10-CM

## 2022-06-24 MED ORDER — CLOPIDOGREL BISULFATE 75 MG PO TABS: 75.0000 mg | ORAL_TABLET | Freq: Every day | ORAL | 0 refills | Status: DC

## 2022-06-26 ENCOUNTER — Other Ambulatory Visit: Payer: Self-pay

## 2022-06-26 DIAGNOSIS — I10 Essential (primary) hypertension: Secondary | ICD-10-CM

## 2022-06-26 MED ORDER — AMLODIPINE BESYLATE 10 MG PO TABS: 10.0000 mg | ORAL_TABLET | Freq: Every day | ORAL | 1 refills | Status: DC

## 2022-06-27 ENCOUNTER — Encounter (HOSPITAL_COMMUNITY): Payer: Self-pay

## 2022-06-27 ENCOUNTER — Ambulatory Visit (HOSPITAL_COMMUNITY)
Admission: EM | Admit: 2022-06-27 | Discharge: 2022-06-27 | Disposition: A | Discharge status: Home / Self Care | Payer: Commercial Managed Care - HMO | Attending: Internal Medicine | Admitting: Internal Medicine

## 2022-06-27 DIAGNOSIS — H5789 Other specified disorders of eye and adnexa: Secondary | ICD-10-CM

## 2022-06-27 MED ORDER — ERYTHROMYCIN 5 MG/GM OP OINT: TOPICAL_OINTMENT | OPHTHALMIC | 0 refills | Status: DC

## 2022-06-27 MED ORDER — TETRACAINE HCL 0.5 % OP SOLN
OPHTHALMIC | Status: AC
  Filled 2022-06-27: qty 4

## 2022-06-27 MED ORDER — FLUORESCEIN SODIUM 1 MG OP STRP
ORAL_STRIP | OPHTHALMIC | Status: AC
  Filled 2022-06-27: qty 1

## 2022-06-27 MED ORDER — EYE WASH OP SOLN
OPHTHALMIC | Status: AC
  Filled 2022-06-27: qty 118

## 2022-06-27 NOTE — ED Triage Notes (Signed)
Fell asleep with contact in and in the morning felt like there was a hair or something in his eye so was trying to get it out and then it started to get red and irritated. Still feels like there is something in his eye now.

## 2022-06-27 NOTE — ED Provider Notes (Signed)
[place]    CSN: [id number] Arrival date & time: 06/27/22  1108      History   Chief Complaint Chief Complaint  Patient presents with  . Eye Problem    Irritation of right eye    HPI Daniel Gonzalez is a 66 y.o. male.   Patient presents urgent care for evaluation of irritation to the right eye that started yesterday after he took his contact out of the eye. He does not remember feeling as though he scratched the eye, but states he feels as though "something is still in the right eye". Left eye is normal and without symptoms. Currently wearing contact to the left eye. Vision is slightly blurry to the right eye but attributes this to the lack of vision correction. No blurry vision to the left eye (vision corrected with contact lens). He is currently experiencing 4 on a scale of 0-10 discomfort to the right eye but denies posterior eye pain. Right eye has been draining and watering since symptom onset and he reports some light sensitivity to the eye. Denies presence of floaters or spots to the vision, dizziness, headache, URI symptoms, and difficulty moving eyes. Last visit to eye doctor was 6 months ago, he plans to follow-up with them soon.   Eye Problem   Past Medical History:  Diagnosis Date  . Anal fistula   . Aortic atherosclerosis ([place])   . Diverticulosis   . GERD (gastroesophageal reflux disease)   . Hiatal hernia   . HIV infection ([place])   . Hypertension   . Malignant neoplasm of prostate ([place]) 06/19/2020  . Pancreatic cyst   . Pneumonia yrs ago   "walking pneumonia"  . Prostate cancer ([place])   . Stroke [facility]) yrs ago   no current neurlogist  . Tubular adenoma of colon   . Wears contact lenses   . Wears dentures    upper full lower partial    Patient Active Problem List   Diagnosis Date Noted  . Insomnia 10/18/2020  . Malignant neoplasm of prostate ([place]) 06/19/2020  . Depression 04/17/2020  . Chronic diarrhea 09/07/2019  . Ruptured epithelial  cyst 06/08/2019  . Impacted cerumen of left ear 05/17/2019  . B12 deficiency 10/23/2017  . Unintentional weight loss 12/15/2016  . Protein-calorie malnutrition ([place]) 08/25/2015  . Normocytic anemia 08/24/2015  . Cerebral aneurysm, nonruptured 09/02/2013  . History of CVA (cerebrovascular accident) 06/22/2013  . Routine general medical examination at a health care facility 01/07/2013  . Essential hypertension 05/01/2010  . CARPAL TUNNEL SYNDROME 01/02/2010  . Human immunodeficiency virus (HIV) disease ([place]) 09/17/2006  . GENITAL HERPES 09/17/2006  . ERECTILE DYSFUNCTION 09/17/2006  . 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 CIGARETTE SMOKER 09/17/2006  . DIVERTICULOSIS, COLON W/O HEM 09/17/2006    Past Surgical History:  Procedure Laterality Date  . ? seton placement 10 years ago     . BIOPSY  06/02/2022   Procedure: BIOPSY;  Surgeon: Rush Landmark Telford Nab., MD;  Location: [facility];  Service: Gastroenterology;;  . COLONOSCOPY    . cyst in back     removed from back  . ESOPHAGOGASTRODUODENOSCOPY N/A 06/02/2022   Procedure: ESOPHAGOGASTRODUODENOSCOPY (EGD);  Surgeon: Irving Copas., MD;  Location: [facility];  Service: Gastroenterology;  Laterality: N/A;  . EUS N/A 06/02/2022   Procedure: UPPER ENDOSCOPIC ULTRASOUND (EUS) RADIAL;  Surgeon: Rush Landmark Telford Nab., MD;  Location: [facility];  Service: Gastroenterology;  Laterality: N/A;  . FINE NEEDLE ASPIRATION N/A 06/02/2022   Procedure:  FINE NEEDLE ASPIRATION (FNA) LINEAR;  Surgeon: Rush Landmark Telford Nab., MD;  Location: [facility];  Service: Gastroenterology;  Laterality: N/A;  . PROSTATE BIOPSY  2021  . RADIOACTIVE SEED IMPLANT N/A 09/10/2020   Procedure: RADIOACTIVE SEED IMPLANT/BRACHYTHERAPY IMPLANT;  Surgeon: Lucas Mallow, MD;  Location[facility];  Service: Urology;  Laterality: N/A;  . SPACE OAR INSTILLATION N/A 09/10/2020   Procedure: SPACE OAR INSTILLATION;  Surgeon: Lucas Mallow, MD;  Location[facility];  Service: Urology;  Laterality: N/A;  . TEE WITHOUT CARDIOVERSION N/A 06/24/2013   Procedure: TRANSESOPHAGEAL ECHOCARDIOGRAM (TEE);  Surgeon: Jolaine Artist, MD;  Location: [facility];  Service: Cardiovascular;  Laterality: N/A;       Home Medications    Prior to Admission medications   Medication Sig Start Date End Date Taking? Authorizing Provider  acetaminophen (TYLENOL) 650 MG CR tablet Take 1,300 mg by mouth in the morning and at bedtime.   Yes [provider]  amLODipine (NORVASC) 10 MG tablet Take 1 tablet (10 mg total) by mouth daily. 06/26/22  Yes Midge Minium, MD  clopidogrel (PLAVIX) 75 MG tablet Take 1 tablet (75 mg total) by mouth daily. 06/24/22  Yes Midge Minium, MD  darunavir-cobicistat ([name]) 800-150 MG tablet TAKE 1 TABLET BY MOUTH DAILY. SWALLOW WHOLE. DO NOT CRUSH, BREAK OR CHEW TABLETS. TAKE WITH FOOD. 04/17/22  Yes Michel Bickers, MD  darunavir-cobicistat ([name]) 800-150 MG tablet Take 1 tablet by mouth daily with food. do not  crush, break or chew tablets 04/16/22  Yes Michel Bickers, MD  erythromycin ophthalmic ointment Place a 1/2 inch ribbon of ointment into the lower eyelid. 06/27/22  Yes Talbot Grumbling, FNP  famotidine (PEPCID) 40 MG tablet Take 1 tablet (40 mg total) by mouth daily. 05/12/22  Yes Midge Minium, MD  metoprolol succinate (TOPROL-XL) 50 MG 24 hr tablet Take 1 tablet (50 mg total) by mouth daily. Take with or immediately following a meal. 12/27/21  Yes Midge Minium, MD  Multiple Vitamins-Minerals (MULTIVITAMIN MEN) TABS Take 1 tablet by mouth daily.   Yes [provider]  olmesartan (BENICAR) 40 MG tablet Take 40 mg by mouth daily.   Yes [provider]  omeprazole (PRILOSEC OTC) 20 MG tablet Take 1 tablet (20 mg total) by mouth 2 (two) times daily before a meal. 06/02/22  Yes Mansouraty, Telford Nab., MD  Propylene Glycol (SYSTANE  BALANCE) 0.6 % SOLN Place 1 drop into both eyes as needed (dry eyes).   Yes [provider]  sildenafil (VIAGRA) 100 MG tablet Take 1 tablet (100 mg total) by mouth daily as needed for erectile dysfunction. 11/17/17  Yes Michel Bickers, MD  traZODone (DESYREL) 100 MG tablet Take 1 tablet (100 mg total) by mouth at bedtime. 04/22/22  Yes Midge Minium, MD  vitamin B-12 (CYANOCOBALAMIN) 1000 MCG tablet Take 1 tablet (1,000 mcg total) by mouth daily. 12/16/21  Yes Willia Craze, NP  bictegravir-emtricitabine-tenofovir AF (BIKTARVY) 50-200-25 MG TABS tablet Take 1 tablet by mouth daily. 04/17/22   Michel Bickers, MD  bictegravir-emtricitabine-tenofovir AF [facility]) [phone] MG TABS tablet Take 1 table by mouth daily Patient not taking: Reported on 05/30/2022 04/17/22   Michel Bickers, MD  Misc. Devices (PILL SPLITTER) MISC Please use to cut trazodone in 1/2. 01/27/18   Midge Minium, MD  tamsulosin (FLOMAX) 0.4 MG CAPS capsule Take 0.4 mg by mouth in the morning and at bedtime. 12/24/21   [provider]    Family History Family History  Problem Relation Age of Onset  . Arthritis Mother   . Heart disease Mother   . Hypertension Mother   . Diabetes Mother   . Arthritis Father   . Heart disease Father   . Hypertension Father   . Cancer Father   . Lung cancer Brother   . Prostate cancer Brother   . Prostate cancer Brother   . Colon cancer Neg Hx   . Pancreatic cancer Neg Hx   . Breast cancer Neg Hx   . Esophageal cancer Neg Hx   . Rectal cancer Neg Hx   . Ovarian cancer Neg Hx   . Stomach cancer Neg Hx     Social History Social History   Tobacco Use  . Smoking status: Every Day    Packs/day: 0.50    Years: 35.00    Total pack years: 17.50    Types: Cigarettes  . Smokeless tobacco: Never  . Tobacco comments:    slowing down  Vaping Use  . Vaping Use: Never used  Substance Use Topics  . Alcohol use: Not Currently  . Drug use: Yes    Frequency: 7.0  times per week    Types: Marijuana    Comment: weekly     Allergies   Asa [aspirin] and Sulfamethoxazole-trimethoprim   Review of Systems Review of Systems Per HPI  Physical Exam Triage Vital Signs ED Triage Vitals [06/27/22 1135]  Enc Vitals Group     BP 105/67     Pulse Rate (!) 58     Resp 18     Temp 98.2 F (36.8 C)     Temp Source Oral     SpO2 97 %     Weight      Height      Head Circumference      Peak Flow      Pain Score 4     Pain Loc      Pain Edu?      Excl. in [place]?    No data found.  Updated Vital Signs BP 105/67 (BP Location: Left Arm)   Pulse (!) 58   Temp 98.2 F (36.8 C) (Oral)   Resp 18   SpO2 97%   Visual Acuity Right Eye Distance:   Left Eye Distance:   Bilateral Distance:    Right Eye Near:   Left Eye Near:    Bilateral Near:     Physical Exam Vitals and nursing note reviewed.  Constitutional:      Appearance: He is not ill-appearing or toxic-appearing.  HENT:     Head: Normocephalic and atraumatic.     Right Ear: Hearing, tympanic membrane, ear canal and external ear normal.     Left Ear: Hearing, tympanic membrane, ear canal and external ear normal.     Nose: Nose normal.     Mouth/Throat:     Lips: Pink.     Mouth: Mucous membranes are moist.     Pharynx: No posterior oropharyngeal erythema.  Eyes:     General: Lids are normal. Vision grossly intact. Gaze aligned appropriately.     Extraocular Movements: Extraocular movements intact.     Conjunctiva/sclera: Conjunctivae normal.     Pupils: Pupils are equal, round, and reactive to light.  Pulmonary:     Effort: Pulmonary effort is normal.  Musculoskeletal:     Cervical back: Neck supple.  Skin:    General: Skin is warm and dry.  Capillary Refill: Capillary refill takes less than 2 seconds.     Findings: No rash.  Neurological:     General: No focal deficit present.     Mental Status: He is alert and oriented to person, place, and time. Mental status is at  baseline.     Cranial Nerves: No dysarthria or facial asymmetry.  Psychiatric:        Mood and Affect: Mood normal.        Speech: Speech normal.        Behavior: Behavior normal.        Thought Content: Thought content normal.        Judgment: Judgment normal.     UC Treatments / Results  Labs (all labs ordered are listed, but only abnormal results are displayed) Labs Reviewed - No data to display  EKG   Radiology No results found.  Procedures Procedures (including critical care time)  Medications Ordered in UC Medications - No data to display  Initial Impression / Assessment and Plan / UC Course  I have reviewed the triage vital signs and the nursing notes.  Pertinent labs & imaging results that were available during my care of the patient were reviewed by me and considered in my medical decision making (see chart for details).     *** Final Clinical Impressions(s) / UC Diagnoses   Final diagnoses:  Eye irritation     Discharge Instructions      The examination of your eye today did not show a scratch to the eye, your eye is very irritated. Do not wear contacts for the next 2 weeks. Schedule an appointment to follow-up with your eye doctor for soon as possible. Place a thin line of eye ointment to the right eye as prescribed twice daily for the next 7 days. Perform warm compresses to the right eye prior to applying the eye ointment.  If you develop any new or worsening symptoms or do not improve in the next 2 to 3 days, please return.  If your symptoms are severe, please go to the emergency room.  Follow-up with your primary care provider for further evaluation and management of your symptoms as well as ongoing wellness visits.  I hope you feel better!   ED Prescriptions     Medication Sig Dispense Auth. Provider   erythromycin ophthalmic ointment Place a 1/2 inch ribbon of ointment into the lower eyelid. 3.5 g Talbot Grumbling, FNP      PDMP not  reviewed this encounter.

## 2022-06-27 NOTE — Discharge Instructions (Signed)
The examination of your eye today did not show a scratch to the eye, your eye is very irritated. Do not wear contacts for the next 2 weeks. Schedule an appointment to follow-up with your eye doctor for soon as possible. Place a thin line of eye ointment to the right eye as prescribed twice daily for the next 7 days. Perform warm compresses to the right eye prior to applying the eye ointment.  If you develop any new or worsening symptoms or do not improve in the next 2 to 3 days, please return.  If your symptoms are severe, please go to the emergency room.  Follow-up with your primary care provider for further evaluation and management of your symptoms as well as ongoing wellness visits.  I hope you feel better!

## 2022-06-30 ENCOUNTER — Other Ambulatory Visit: Payer: Self-pay

## 2022-06-30 DIAGNOSIS — Z8673 Personal history of transient ischemic attack (TIA), and cerebral infarction without residual deficits: Secondary | ICD-10-CM

## 2022-06-30 MED ORDER — OLMESARTAN MEDOXOMIL 40 MG PO TABS: 40.0000 mg | ORAL_TABLET | Freq: Every day | ORAL | 0 refills | Status: DC

## 2022-06-30 MED ORDER — CLOPIDOGREL BISULFATE 75 MG PO TABS: 75.0000 mg | ORAL_TABLET | Freq: Every day | ORAL | 0 refills | Status: DC

## 2022-07-01 ENCOUNTER — Other Ambulatory Visit: Payer: Self-pay

## 2022-07-01 ENCOUNTER — Other Ambulatory Visit: Payer: Self-pay | Admitting: Family Medicine

## 2022-07-01 DIAGNOSIS — Z8673 Personal history of transient ischemic attack (TIA), and cerebral infarction without residual deficits: Secondary | ICD-10-CM

## 2022-07-01 MED ORDER — CLOPIDOGREL BISULFATE 75 MG PO TABS: 75.0000 mg | ORAL_TABLET | Freq: Every day | ORAL | 0 refills | Status: DC

## 2022-07-01 NOTE — Telephone Encounter (Signed)
Rx has been sent to [facility] on [address] x 2

## 2022-07-01 NOTE — Telephone Encounter (Signed)
Encourage patient to contact the pharmacy for refills or they can request refills through [facility]  (Please schedule appointment if patient has not been seen in over a year)    WHAT PHARMACY WOULD THEY LIKE THIS SENT TO:  [facility] [phone] - [place], [place]: Clopidogrel 75 mg   NOTES/COMMENTS FROM PATIENT:      [place] office please notify patient: It takes 48-72 hours to process rx refill requests Ask patient to call pharmacy to ensure rx is ready before heading there.

## 2022-07-01 NOTE — Telephone Encounter (Signed)
Order yesterday was printed can you please resend to the Daniel Gonzalez per patient request

## 2022-07-03 ENCOUNTER — Other Ambulatory Visit: Payer: Self-pay

## 2022-07-03 MED ORDER — OLMESARTAN MEDOXOMIL 40 MG PO TABS: 40.0000 mg | ORAL_TABLET | Freq: Every day | ORAL | 0 refills | Status: DC

## 2022-07-08 ENCOUNTER — Other Ambulatory Visit (HOSPITAL_COMMUNITY): Payer: Self-pay

## 2022-07-09 ENCOUNTER — Other Ambulatory Visit (HOSPITAL_COMMUNITY): Payer: Self-pay

## 2022-07-18 ENCOUNTER — Other Ambulatory Visit (HOSPITAL_COMMUNITY): Payer: Self-pay

## 2022-07-18 ENCOUNTER — Ambulatory Visit
Admission: RE | Admit: 2022-07-18 | Discharge: 2022-07-18 | Disposition: A | Discharge status: Home / Self Care | Payer: Commercial Managed Care - HMO | Source: Ambulatory Visit | Attending: Gastroenterology | Admitting: Gastroenterology

## 2022-07-18 DIAGNOSIS — D135 Benign neoplasm of extrahepatic bile ducts: Secondary | ICD-10-CM

## 2022-07-18 MED ORDER — IIOPAMIDOL (ISOVUE-250) INJECTION 51%
100.0000 mL | Freq: Once | INTRAVENOUS | Status: AC | PRN
  Administered 2022-07-18: 100 mL via INTRAVENOUS

## 2022-07-23 ENCOUNTER — Encounter (HOSPITAL_COMMUNITY): Payer: Self-pay | Admitting: Gastroenterology

## 2022-07-25 ENCOUNTER — Other Ambulatory Visit: Payer: Self-pay

## 2022-07-25 MED ORDER — TRAZODONE HCL 100 MG PO TABS: 100.0000 mg | ORAL_TABLET | Freq: Every day | ORAL | 0 refills | Status: DC

## 2022-07-31 ENCOUNTER — Ambulatory Visit (HOSPITAL_COMMUNITY): Payer: Commercial Managed Care - HMO | Admitting: Anesthesiology

## 2022-07-31 ENCOUNTER — Other Ambulatory Visit: Payer: Self-pay

## 2022-07-31 ENCOUNTER — Ambulatory Visit (HOSPITAL_COMMUNITY)
Admission: RE | Admit: 2022-07-31 | Discharge: 2022-07-31 | Disposition: A | Discharge status: Home / Self Care | Payer: Commercial Managed Care - HMO | Source: Ambulatory Visit | Attending: Gastroenterology | Admitting: Gastroenterology

## 2022-07-31 ENCOUNTER — Ambulatory Visit (HOSPITAL_BASED_OUTPATIENT_CLINIC_OR_DEPARTMENT_OTHER): Payer: Commercial Managed Care - HMO | Admitting: Anesthesiology

## 2022-07-31 ENCOUNTER — Encounter (HOSPITAL_COMMUNITY): Payer: Self-pay | Admitting: Gastroenterology

## 2022-07-31 ENCOUNTER — Encounter (HOSPITAL_COMMUNITY)
Admission: RE | Disposition: A | Discharge status: Home / Self Care | Payer: Self-pay | Source: Ambulatory Visit | Attending: Gastroenterology

## 2022-07-31 DIAGNOSIS — I10 Essential (primary) hypertension: Secondary | ICD-10-CM | POA: Insufficient documentation

## 2022-07-31 DIAGNOSIS — K449 Diaphragmatic hernia without obstruction or gangrene: Secondary | ICD-10-CM | POA: Insufficient documentation

## 2022-07-31 DIAGNOSIS — Z8673 Personal history of transient ischemic attack (TIA), and cerebral infarction without residual deficits: Secondary | ICD-10-CM

## 2022-07-31 DIAGNOSIS — F17210 Nicotine dependence, cigarettes, uncomplicated: Secondary | ICD-10-CM | POA: Insufficient documentation

## 2022-07-31 DIAGNOSIS — K219 Gastro-esophageal reflux disease without esophagitis: Secondary | ICD-10-CM | POA: Diagnosis not present

## 2022-07-31 DIAGNOSIS — F32A Depression, unspecified: Secondary | ICD-10-CM | POA: Diagnosis not present

## 2022-07-31 DIAGNOSIS — D630 Anemia in neoplastic disease: Secondary | ICD-10-CM | POA: Diagnosis not present

## 2022-07-31 DIAGNOSIS — D135 Benign neoplasm of extrahepatic bile ducts: Secondary | ICD-10-CM

## 2022-07-31 DIAGNOSIS — K3189 Other diseases of stomach and duodenum: Secondary | ICD-10-CM | POA: Insufficient documentation

## 2022-07-31 DIAGNOSIS — K222 Esophageal obstruction: Secondary | ICD-10-CM | POA: Diagnosis not present

## 2022-07-31 DIAGNOSIS — D649 Anemia, unspecified: Secondary | ICD-10-CM | POA: Diagnosis not present

## 2022-07-31 DIAGNOSIS — K317 Polyp of stomach and duodenum: Secondary | ICD-10-CM | POA: Insufficient documentation

## 2022-07-31 HISTORY — PX: SUBMUCOSAL TATTOO INJECTION: SHX6856

## 2022-07-31 HISTORY — PX: ENDOSCOPIC MUCOSAL RESECTION: SHX6839

## 2022-07-31 HISTORY — PX: SUBMUCOSAL LIFTING INJECTION: SHX6855

## 2022-07-31 HISTORY — PX: HEMOSTASIS CLIP PLACEMENT: SHX6857

## 2022-07-31 HISTORY — PX: ESOPHAGOGASTRODUODENOSCOPY (EGD) WITH PROPOFOL: SHX5813

## 2022-07-31 SURGERY — ESOPHAGOGASTRODUODENOSCOPY (EGD) WITH PROPOFOL
Anesthesia: Monitor Anesthesia Care

## 2022-07-31 MED ORDER — LIDOCAINE 2% (20 MG/ML) 5 ML SYRINGE
INTRAMUSCULAR | Status: DC | PRN
  Administered 2022-07-31: 100 mg via INTRAVENOUS

## 2022-07-31 MED ORDER — PROPOFOL 10 MG/ML IV BOLUS
INTRAVENOUS | Status: DC | PRN
  Administered 2022-07-31: 40 mg via INTRAVENOUS
  Administered 2022-07-31 (×3): 20 mg via INTRAVENOUS

## 2022-07-31 MED ORDER — DEXMEDETOMIDINE HCL IN NACL 80 MCG/20ML IV SOLN
INTRAVENOUS | Status: DC | PRN
  Administered 2022-07-31: 8 ug via BUCCAL
  Administered 2022-07-31: 4 ug via BUCCAL

## 2022-07-31 MED ORDER — SODIUM CHLORIDE 0.9 % IV SOLN: INTRAVENOUS | Status: DC

## 2022-07-31 MED ORDER — EPHEDRINE SULFATE (PRESSORS) 50 MG/ML IJ SOLN
INTRAMUSCULAR | Status: DC | PRN
  Administered 2022-07-31: 10 mg via INTRAVENOUS

## 2022-07-31 MED ORDER — PROPOFOL 500 MG/50ML IV EMUL
INTRAVENOUS | Status: DC | PRN
  Administered 2022-07-31: 125 ug/kg/min via INTRAVENOUS

## 2022-07-31 MED ORDER — CLOPIDOGREL BISULFATE 75 MG PO TABS: 75.0000 mg | ORAL_TABLET | Freq: Every day | ORAL | 0 refills | Status: DC

## 2022-07-31 MED ORDER — SPOT INK MARKER SYRINGE KIT
PACK | SUBMUCOSAL | Status: DC | PRN
  Administered 2022-07-31: 1 mL via SUBMUCOSAL

## 2022-07-31 MED ORDER — GLUCAGON HCL RDNA (DIAGNOSTIC) 1 MG IJ SOLR
INTRAMUSCULAR | Status: DC | PRN
  Administered 2022-07-31 (×2): .25 mg via INTRAVENOUS

## 2022-07-31 MED ORDER — OMEPRAZOLE MAGNESIUM 20 MG PO TBEC: 40.0000 mg | DELAYED_RELEASE_TABLET | Freq: Two times a day (BID) | ORAL | 6 refills | Status: DC

## 2022-07-31 MED ORDER — GLYCOPYRROLATE 0.2 MG/ML IJ SOLN
INTRAMUSCULAR | Status: DC | PRN
  Administered 2022-07-31: .2 mg via INTRAVENOUS

## 2022-07-31 MED ORDER — SUCRALFATE 1 G PO TABS: 1.0000 g | ORAL_TABLET | Freq: Two times a day (BID) | ORAL | 0 refills | Status: DC

## 2022-07-31 MED ORDER — LACTATED RINGERS IV SOLN: INTRAVENOUS | Status: DC

## 2022-07-31 MED ORDER — PROPOFOL 500 MG/50ML IV EMUL
INTRAVENOUS | Status: AC
  Filled 2022-07-31: qty 100

## 2022-07-31 SURGICAL SUPPLY — 15 items

## 2022-07-31 NOTE — Transfer of Care (Signed)
Immediate Anesthesia Transfer of Care Note  Patient: Daniel Gonzalez  Procedure(s) Performed: ESOPHAGOGASTRODUODENOSCOPY (EGD) WITH PROPOFOL ENDOSCOPIC MUCOSAL RESECTION SUBMUCOSAL LIFTING INJECTION POLYPECTOMY HEMOSTASIS CLIP PLACEMENT  Patient Location: Endoscopy Unit  Anesthesia Type:General  Level of Consciousness: awake, alert , and oriented  Airway & Oxygen Therapy: Patient Spontanous Breathing and Patient connected to face mask oxygen  Post-op Assessment: Report given to RN and Post -op Vital signs reviewed and stable  Post vital signs: Reviewed and stable  Last Vitals:  Vitals Value Taken Time  BP 89/55 07/31/22 0833  Temp    Pulse 51 07/31/22 0836  Resp 15 07/31/22 0836  SpO2 100 % 07/31/22 0836  Vitals shown include unvalidated device data.  Last Pain:  Vitals:   07/31/22 0643  TempSrc: Temporal  PainSc: 0-No pain         Complications: No notable events documented.

## 2022-07-31 NOTE — H&P (Signed)
GASTROENTEROLOGY PROCEDURE H&P NOTE   Primary Care Physician: Daniel Minium, MD  HPI: Daniel Gonzalez is a 66 y.o. 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  Past Medical History:  Diagnosis Date   Anal fistula    Aortic atherosclerosis ([facility])    Diverticulosis    GERD (gastroesophageal reflux disease)    Hiatal hernia    HIV infection ([place])    Hypertension    Malignant neoplasm of prostate ([place]) 06/19/2020   Pancreatic cyst    Pneumonia yrs ago   "walking pneumonia"   Prostate cancer ([place])    Stroke ([place]) yrs ago   no current neurlogist   Tubular adenoma of colon    Wears contact lenses    Wears dentures    upper full lower partial   Past Surgical History:  Procedure Laterality Date   ? seton placement 10 years ago      BIOPSY  06/02/2022   Procedure: BIOPSY;  Surgeon: Daniel Gonzalez., MD;  Location: [facility];  Service: Gastroenterology;;   COLONOSCOPY     cyst in back     removed from back   ESOPHAGOGASTRODUODENOSCOPY N/A 06/02/2022   Procedure: ESOPHAGOGASTRODUODENOSCOPY (EGD);  Surgeon: Daniel Gonzalez., MD;  Location: [facility];  Service: Gastroenterology;  Laterality: N/A;   EUS N/A 06/02/2022   Procedure: UPPER ENDOSCOPIC ULTRASOUND (EUS) RADIAL;  Surgeon: Daniel Gonzalez., MD;  Location: [facility];  Service: Gastroenterology;  Laterality: N/A;   FINE NEEDLE ASPIRATION N/A 06/02/2022   Procedure: FINE NEEDLE ASPIRATION (FNA) LINEAR;  Surgeon: Daniel Gonzalez., MD;  Location: [facility];  Service: Gastroenterology;  Laterality: N/A;   PROSTATE BIOPSY  2021   RADIOACTIVE SEED IMPLANT N/A 09/10/2020   Procedure: RADIOACTIVE SEED IMPLANT/BRACHYTHERAPY IMPLANT;  Surgeon: Daniel Mallow, MD;  Location: [facility];  Service: Urology;  Laterality: N/A;   SPACE OAR INSTILLATION N/A 09/10/2020   Procedure: SPACE OAR INSTILLATION;  Surgeon: Daniel Mallow, MD;  Location: [facility];  Service: Urology;  Laterality: N/A;   TEE WITHOUT CARDIOVERSION N/A 06/24/2013   Procedure: TRANSESOPHAGEAL ECHOCARDIOGRAM (TEE);  Surgeon: Daniel Artist, MD;  Location: [facility]COPY;  Service: Cardiovascular;  Laterality: N/A;   Current Facility-Administered Medications  Medication Dose Route Frequency Provider Last Rate Last Admin   0.9 %  sodium chloride infusion   Intravenous Continuous Gonzalez, Daniel Nab., MD       lactated ringers infusion   Intravenous Continuous Gonzalez, Daniel Nab., MD 10 mL/hr at 07/31/22 0656 Continued from Pre-op at 07/31/22 0656    Current Facility-Administered Medications:    0.9 %  sodium chloride infusion, , Intravenous, Continuous, Gonzalez, Daniel Nab., MD   lactated ringers infusion, , Intravenous, Continuous, Gonzalez, Daniel Nab., MD, Last Rate: 10 mL/hr at 07/31/22 0656, Continued from Pre-op at 07/31/22 0656 Allergies  Allergen Reactions   Asa [Aspirin] Palpitations    Speeds up heart rate   Sulfamethoxazole-Trimethoprim Itching    Bactrim*   Family History  Problem Relation Age of Onset   Arthritis Mother    Heart disease Mother    Hypertension Mother    Diabetes Mother    Arthritis Father    Heart disease Father    Hypertension Father    Cancer Father    Lung cancer Brother    Prostate cancer Brother    Prostate cancer Brother    Colon cancer Neg Hx    Pancreatic cancer Neg  Hx    Breast cancer Neg Hx    Esophageal cancer Neg Hx    Rectal cancer Neg Hx    Ovarian cancer Neg Hx    Stomach cancer Neg Hx    Social History   Socioeconomic History   Marital status: Divorced    Spouse name: Not on file   Number of children: 3   Years of education: HS   Highest education level: Not on file  Occupational History   Occupation: Firefighter: [employer]  Tobacco Use   Smoking status: Every Day    Packs/day: 0.50    Years: 35.00    Total pack years: 17.50    Types: Cigarettes    Smokeless tobacco: Never   Tobacco comments:    slowing down  Vaping Use   Vaping Use: Never used  Substance and Sexual Activity   Alcohol use: Not Currently   Drug use: Yes    Frequency: 7.0 times per week    Types: Marijuana    Comment: weekly   Sexual activity: Yes    Partners: Female    Birth control/protection: Condom    Comment: declined condoms 03/2021  Other Topics Concern   Not on file  Social History Narrative   Patient lives at home alone.   Caffeine Use: 1 cup occasionally   Social Determinants of Health   Financial Resource Strain: Not on file  Food Insecurity: Not on file  Transportation Needs: Not on file  Physical Activity: Not on file  Stress: Not on file  Social Connections: Not on file  Intimate Partner Violence: Not on file    Physical Exam: Today's Vitals   07/23/22 1226 07/31/22 0643  BP:  122/83  Pulse:  (!) 45  Resp:  (!) 21  Temp:  98.4 F (36.9 C)  TempSrc:  Temporal  SpO2:  100%  Weight: 49.9 kg 49.9 kg  Height:  '5\' 4"'$  (1.626 m)  PainSc:  0-No pain   Body mass index is 18.88 kg/m. GEN: NAD EYE: Sclerae anicteric ENT: MMM CV: Non-tachycardic GI: Soft, NT/ND NEURO:  Alert & Oriented x 3  Lab Results: No results for input(s): "WBC", "HGB", "HCT", "PLT" in the last 72 hours. BMET No results for input(s): "NA", "K", "CL", "CO2", "GLUCOSE", "BUN", "CREATININE", "CALCIUM" in the last 72 hours. LFT No results for input(s): "PROT", "ALBUMIN", "AST", "ALT", "ALKPHOS", "BILITOT", "BILIDIR", "IBILI" in the last 72 hours. PT/INR No results for input(s): "LABPROT", "INR" in the last 72 hours.   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  The risks and benefits of endoscopic evaluation/treatment were discussed with the patient and/or family; these include but are not limited to the risk of perforation, infection, bleeding, missed lesions, lack of diagnosis, severe illness requiring  hospitalization, as well as anesthesia and sedation related illnesses.  The patient's history has been reviewed, patient examined, no change in status, and deemed stable for procedure.  The patient and/or family is agreeable to proceed.    Daniel Britain, MD [facility] Advanced [facility] Office # [phone]

## 2022-07-31 NOTE — Op Note (Signed)
[facility] Patient Name: Daniel Gonzalez Procedure Date: 07/31/2022 MRN: [id number] Attending MD: [name] , MD, [id number] Date of Birth: [birth year] CSN: [id number] Age: 66 Admit Type: Outpatient Procedure:                Upper GI endoscopy Indications:              Polyps in the duodenum, For therapy of polyps in                            the duodenum Providers:                [name], MD, Doristine Johns, RN, Cletis Athens, Technician Referring MD:             Lajuan Lines. Pyrtle, MD Medicines:                Monitored Anesthesia Care, Glucagon 0.5 mg IV Complications:            No immediate complications. Estimated Blood Loss:     Estimated blood loss was minimal. Procedure:                Pre-Anesthesia Assessment:                           - Prior to the procedure, a History and Physical                            was performed, and patient medications and                            allergies were reviewed. The patient's tolerance of                            previous anesthesia was also reviewed. The risks                            and benefits of the procedure and the sedation                            options and risks were discussed with the patient.                            All questions were answered, and informed consent                            was obtained. Prior Anticoagulants: The patient has                            taken Plavix (clopidogrel), last dose was 5 days                            prior to procedure. ASA Grade Assessment: III - A  patient with severe systemic disease. After                            reviewing the risks and benefits, the patient was                            deemed in satisfactory condition to undergo the                            procedure.                           After obtaining informed consent, the endoscope was                             passed under direct vision. Throughout the                            procedure, the patient's blood pressure, pulse, and                            oxygen saturations were monitored continuously. The                            [id number]-H190 ([id number]) Olympus endoscope was introduced                            through the mouth, and advanced to the second part                            of duodenum. The [id number]-Q190V ([id number]) Olympus                            duodenoscope was introduced through the mouth, and                            advanced to the second part of duodenum. The upper                            GI endoscopy was technically difficult and complex.                            Successful completion of the procedure was aided by                            performing the maneuvers documented (below) in this                            report. The patient tolerated the procedure. Scope In: Scope Out: Findings:      No gross lesions were noted in the entire esophagus.      A widely patent Schatzki ring was found at the gastroesophageal junction.      The Z-line was regular and was found 38 cm from the incisors.  A 2 cm hiatal hernia was present.      Striped mildly erythematous mucosa without bleeding was found in the       gastric antrum.      No other gross lesions were noted in the entire examined stomach       (stomach had already been biopsied recently).      The major papilla was normal (though hidden under a hood and under       congested mucosa - could see bile drain however).      A single 18 mm sessile polyp was found in the second portion of the       duodenum about 120 degrees from the papilla. Preparations were made for       mucosal resection. Demarcation of the lesion was performed with       high-definition white light and narrow band imaging to clearly identify       the boundaries of the lesion. EverLift was injected to raise the lesion.       Piecemeal mucosal  resection using a snare was performed. Resection and       retrieval were complete. Resected tissue margins were examined and clear       of polyp tissue. To close the defect after mucosal resection, two       hemostatic clips were successfully placed. There was no bleeding at the       end of the procedure. Area proximal to the resection was tattooed with       an injection of Spot (carbon black).      Diffuse moderately congested mucosa without active bleeding and with no       stigmata of bleeding was found in the duodenal bulb, in the first       portion of the duodenum, in the second portion of the duodenum, in the       area of the papilla and in the area of the minor papilla. Impression:               - No gross lesions in the entire esophagus. Widely                            patent Schatzki ring. Z-line regular, 38 cm from                            the incisors.                           - 2 cm hiatal hernia.                           - Erythematous mucosa in the antrum. No other gross                            lesions in the entire stomach. Previously biopsied                            so not rebiopsied.                           - Normal major papilla though it was hidden under a  hood and congested mucosa..                           - A single duodenal polyp found approximately 120                            degrees from the papilla. Resected and retrieved                            via piecemeal mucosal resection. Clips were placed.                            Tattooed area proximal.                           - Congested duodenal mucosa throughout. Moderate Sedation:      Not Applicable - Patient had care per Anesthesia. Recommendation:           - The patient will be observed post-procedure,                            until all discharge criteria are met.                           - Discharge patient to home.                           - Patient  has a contact number available for                            emergencies. The signs and symptoms of potential                            delayed complications were discussed with the                            patient. Return to normal activities tomorrow.                            Written discharge instructions were provided to the                            patient.                           - Full liquid diet today.                           - Use omeprazole 40 mg twice daily for 1 month.                            Then may decrease to once daily for 1 month and                            then may continue or discontinue (looks like he  is                            not taking already).                           - Carafate twice daily for 2 weeks.                           - May restart Plavix on 12/16 to decrease post                            interventional bleeding risk.                           - Continue present medications otherwise.                           - Await pathology results.                           - Repeat upper endoscopy in 1 year for surveillance.                           - The findings and recommendations were discussed                            with the patient.                           - The findings and recommendations were discussed                            with the patient's family. Procedure Code(s):        --- Professional ---                           [phone], Esophagogastroduodenoscopy, flexible,                            transoral; with endoscopic mucosal resection Diagnosis Code(s):        --- Professional ---                           K22.2, Esophageal obstruction                           K44.9, Diaphragmatic hernia without obstruction or                            gangrene                           K31.89, Other diseases of stomach and duodenum                           K31.7, Polyp of stomach and duodenum CPT copyright 2022 American  [employer] Association. All rights  reserved. The codes documented in this report are preliminary and upon coder review may  be revised to meet current compliance requirements. [name], MD 07/31/2022 8:53:34 AM Number of Addenda: 0

## 2022-07-31 NOTE — Anesthesia Preprocedure Evaluation (Addendum)
Anesthesia Evaluation  Patient identified by MRN, date of birth, ID band Patient awake    Reviewed: Allergy & Precautions, NPO status , Patient's Chart, lab work & pertinent test results  History of Anesthesia Complications Negative for: history of anesthetic complications  Airway Mallampati: I  TM Distance: >3 FB Neck ROM: Full    Dental  (+) Upper Dentures, Partial Lower   Pulmonary neg shortness of breath, neg COPD, neg recent URI, Current Smoker and Patient abstained from smoking.   breath sounds clear to auscultation       Cardiovascular hypertension, Pt. on medications and Pt. on home beta blockers (-) angina (-) Past MI and (-) CHF  Rhythm:Regular  2014:  Left ventricle: The cavity size was normal. Wall thickness    was normal. Systolic function was normal. The estimated    ejection fraction was 55%. Wall motion was normal; there    were no regional wall motion abnormalities.  - Aortic valve: No evidence of vegetation.  - Mitral valve: No evidence of vegetation.  - Right atrium: No evidence of thrombus in the atrial cavity    or appendage.  - Atrial septum: No defect or patent foramen ovale was    identified.  - Tricuspid valve: No evidence of vegetation.  - Pulmonic valve: No evidence of vegetation.     Neuro/Psych  PSYCHIATRIC DISORDERS  Depression     Neuromuscular disease CVA    GI/Hepatic Neg liver ROS, hiatal hernia,GERD  ,,  Endo/Other  Lab Results      Component                Value               Date                      HGBA1C                   5.7 (H)             06/23/2013             Renal/GU negative Renal ROSLab Results      Component                Value               Date                      CREATININE               1.06                06/12/2022                Musculoskeletal negative musculoskeletal ROS (+)    Abdominal   Peds  Hematology  (+) Blood dyscrasia, anemia Lab Results       Component                Value               Date                      WBC                      6.1                 06/12/2022  HGB                      11.6 (L)            06/12/2022                HCT                      34.7 (L)            06/12/2022                MCV                      96.9                06/12/2022                PLT                      282.0               06/12/2022              Anesthesia Other Findings   Reproductive/Obstetrics                             Anesthesia Physical Anesthesia Plan  ASA: 2  Anesthesia Plan: MAC   Post-op Pain Management: Minimal or no pain anticipated   Induction:   PONV Risk Score and Plan: 0 and Propofol infusion and Treatment may vary due to age or medical condition  Airway Management Planned: Nasal Cannula, Simple Face Mask and Natural Airway  Additional Equipment: None  Intra-op Plan:   Post-operative Plan:   Informed Consent: I have reviewed the patients History and Physical, chart, labs and discussed the procedure including the risks, benefits and alternatives for the proposed anesthesia with the patient or authorized representative who has indicated his/her understanding and acceptance.     Dental advisory given  Plan Discussed with: CRNA  Anesthesia Plan Comments:        Anesthesia Quick Evaluation

## 2022-07-31 NOTE — Discharge Instructions (Addendum)
YOU HAD AN ENDOSCOPIC PROCEDURE TODAY: Refer to the procedure report and other information in the discharge instructions given to you for any specific questions about what was found during the examination. If this information does not answer your questions, please call [facility] office at [phone] to clarify.   YOU SHOULD EXPECT: Some feelings of bloating in the abdomen. Passage of more gas than usual. Walking can help get rid of the air that was put into your GI tract during the procedure and reduce the bloating. If you had a lower endoscopy (such as a colonoscopy or flexible sigmoidoscopy) you may notice spotting of blood in your stool or on the toilet paper. Some abdominal soreness may be present for a day or two, also.  DIET: Your first meal following the procedure should be a light meal and then it is ok to progress to your normal diet. A half-sandwich or bowl of soup is an example of a good first meal. Heavy or fried foods are harder to digest and may make you feel nauseous or bloated. Drink plenty of fluids but you should avoid alcoholic beverages for 24 hours. If you had a esophageal dilation, please see attached instructions for diet.    ACTIVITY: Your care partner should take you home directly after the procedure. You should plan to take it easy, moving slowly for the rest of the day. You can resume normal activity the day after the procedure however YOU SHOULD NOT DRIVE, use power tools, machinery or perform tasks that involve climbing or major physical exertion for 24 hours (because of the sedation medicines used during the test).   SYMPTOMS TO REPORT IMMEDIATELY: A gastroenterologist can be reached at any hour. Please call [phone]  for any of the following symptoms:   Following upper endoscopy (EGD, EUS, ERCP, esophageal dilation) Vomiting of blood or coffee ground material  New, significant abdominal pain  New, significant chest pain or pain under the shoulder blades  Painful or  persistently difficult swallowing  New shortness of breath  Black, tarry-looking or red, bloody stools  FOLLOW UP:  If any biopsies were taken you will be contacted by phone or by letter within the next 1-3 weeks. Call [phone]  if you have not heard about the biopsies in 3 weeks.  Please also call with any specific questions about appointments or follow up tests.  

## 2022-08-01 ENCOUNTER — Encounter: Payer: Self-pay | Admitting: Gastroenterology

## 2022-08-01 LAB — SURGICAL PATHOLOGY

## 2022-08-01 NOTE — Anesthesia Postprocedure Evaluation (Signed)
Anesthesia Post Note  Patient: DARRIO BADE  Procedure(s) Performed: ESOPHAGOGASTRODUODENOSCOPY (EGD) WITH PROPOFOL ENDOSCOPIC MUCOSAL RESECTION SUBMUCOSAL LIFTING INJECTION POLYPECTOMY HEMOSTASIS CLIP PLACEMENT     Patient location during evaluation: PACU Anesthesia Type: MAC Level of consciousness: awake and alert Pain management: pain level controlled Vital Signs Assessment: post-procedure vital signs reviewed and stable Respiratory status: spontaneous breathing, nonlabored ventilation and respiratory function stable Cardiovascular status: stable and blood pressure returned to baseline Postop Assessment: no apparent nausea or vomiting Anesthetic complications: no  No notable events documented.  Last Vitals:  Vitals:   07/31/22 0850 07/31/22 0900  BP: 109/64 115/63  Pulse: (!) 44 (!) 43  Resp: 19 (!) 23  Temp:    SpO2: 100% 100%    Last Pain:  Vitals:   08/01/22 1554  TempSrc:   PainSc: 0-No pain                 Venesha Petraitis

## 2022-08-03 ENCOUNTER — Encounter (HOSPITAL_COMMUNITY): Payer: Self-pay | Admitting: Gastroenterology

## 2022-08-14 ENCOUNTER — Other Ambulatory Visit (HOSPITAL_COMMUNITY): Payer: Self-pay

## 2022-08-19 ENCOUNTER — Other Ambulatory Visit (HOSPITAL_COMMUNITY): Payer: Self-pay

## 2022-08-21 ENCOUNTER — Other Ambulatory Visit: Payer: Self-pay

## 2022-08-21 ENCOUNTER — Other Ambulatory Visit (HOSPITAL_COMMUNITY): Payer: Self-pay

## 2022-09-09 ENCOUNTER — Other Ambulatory Visit (HOSPITAL_COMMUNITY): Payer: Self-pay

## 2022-09-10 ENCOUNTER — Other Ambulatory Visit (HOSPITAL_COMMUNITY): Payer: Self-pay

## 2022-09-12 ENCOUNTER — Other Ambulatory Visit (HOSPITAL_COMMUNITY): Payer: Self-pay

## 2022-09-18 ENCOUNTER — Other Ambulatory Visit (HOSPITAL_COMMUNITY): Payer: Self-pay

## 2022-09-25 ENCOUNTER — Other Ambulatory Visit: Payer: Self-pay

## 2022-09-25 DIAGNOSIS — Z8673 Personal history of transient ischemic attack (TIA), and cerebral infarction without residual deficits: Secondary | ICD-10-CM

## 2022-09-25 MED ORDER — CLOPIDOGREL BISULFATE 75 MG PO TABS: 75.0000 mg | ORAL_TABLET | Freq: Every day | ORAL | 0 refills | Status: DC

## 2022-09-30 ENCOUNTER — Other Ambulatory Visit: Payer: Self-pay

## 2022-09-30 DIAGNOSIS — Z8673 Personal history of transient ischemic attack (TIA), and cerebral infarction without residual deficits: Secondary | ICD-10-CM

## 2022-09-30 MED ORDER — CLOPIDOGREL BISULFATE 75 MG PO TABS: 75.0000 mg | ORAL_TABLET | Freq: Every day | ORAL | 0 refills | Status: DC

## 2022-10-06 ENCOUNTER — Telehealth: Payer: Self-pay | Admitting: Family Medicine

## 2022-10-07 ENCOUNTER — Encounter: Payer: Self-pay | Admitting: Family Medicine

## 2022-10-07 ENCOUNTER — Ambulatory Visit (INDEPENDENT_AMBULATORY_CARE_PROVIDER_SITE_OTHER): Payer: Commercial Managed Care - HMO | Admitting: Family Medicine

## 2022-10-07 VITALS — BP 100/62 | HR 51 | Temp 97.9°F | Resp 16 | Ht 64.0 in | Wt 115.0 lb

## 2022-10-07 DIAGNOSIS — Z8673 Personal history of transient ischemic attack (TIA), and cerebral infarction without residual deficits: Secondary | ICD-10-CM

## 2022-10-07 DIAGNOSIS — I10 Essential (primary) hypertension: Secondary | ICD-10-CM | POA: Diagnosis not present

## 2022-10-07 DIAGNOSIS — E440 Moderate protein-calorie malnutrition: Secondary | ICD-10-CM

## 2022-10-07 LAB — CBC WITH DIFFERENTIAL/PLATELET
Basophils Absolute: 0 10*3/uL (ref 0.0–0.1)
Basophils Relative: 0.7 % (ref 0.0–3.0)
Eosinophils Absolute: 0.1 10*3/uL (ref 0.0–0.7)
Eosinophils Relative: 1.3 % (ref 0.0–5.0)
HCT: 37.6 % — ABNORMAL LOW (ref 39.0–52.0)
Hemoglobin: 12.6 g/dL — ABNORMAL LOW (ref 13.0–17.0)
Lymphocytes Relative: 39.8 % (ref 12.0–46.0)
Lymphs Abs: 2.5 10*3/uL (ref 0.7–4.0)
MCHC: 33.6 g/dL (ref 30.0–36.0)
MCV: 95.2 fl (ref 78.0–100.0)
Monocytes Absolute: 0.8 10*3/uL (ref 0.1–1.0)
Monocytes Relative: 11.9 % (ref 3.0–12.0)
Neutro Abs: 2.9 10*3/uL (ref 1.4–7.7)
Neutrophils Relative %: 46.3 % (ref 43.0–77.0)
Platelets: 273 10*3/uL (ref 150.0–400.0)
RBC: 3.95 Mil/uL — ABNORMAL LOW (ref 4.22–5.81)
RDW: 15.1 % (ref 11.5–15.5)
WBC: 6.3 10*3/uL (ref 4.0–10.5)

## 2022-10-07 LAB — LIPID PANEL
Cholesterol: 189 mg/dL (ref 0–200)
HDL: 35.8 mg/dL — ABNORMAL LOW (ref >39.00)
LDL Cholesterol: 119 mg/dL — ABNORMAL HIGH (ref 0–99)
NonHDL: 152.79
Total CHOL/HDL Ratio: 5
Triglycerides: 170 mg/dL — ABNORMAL HIGH (ref 0.0–149.0)
VLDL: 34 mg/dL (ref 0.0–40.0)

## 2022-10-07 LAB — HEPATIC FUNCTION PANEL
ALT: 9 U/L (ref 0–53)
AST: 13 U/L (ref 0–37)
Albumin: 4.4 g/dL (ref 3.5–5.2)
Alkaline Phosphatase: 72 U/L (ref 39–117)
Bilirubin, Direct: 0.1 mg/dL (ref 0.0–0.3)
Total Bilirubin: 0.3 mg/dL (ref 0.2–1.2)
Total Protein: 8 g/dL (ref 6.0–8.3)

## 2022-10-07 LAB — B12 AND FOLATE PANEL
Folate: 11 ng/mL (ref >5.9)
Vitamin B-12: >1500 pg/mL — ABNORMAL HIGH (ref 211–911)

## 2022-10-07 LAB — BASIC METABOLIC PANEL
BUN: 13 mg/dL (ref 6–23)
CO2: 25 mEq/L (ref 19–32)
Calcium: 9.9 mg/dL (ref 8.4–10.5)
Chloride: 106 mEq/L (ref 96–112)
Creatinine, Ser: 1.07 mg/dL (ref 0.40–1.50)
GFR: 73.4 mL/min (ref >60.00)
Glucose, Bld: 84 mg/dL (ref 70–99)
Potassium: 4 mEq/L (ref 3.5–5.1)
Sodium: 139 mEq/L (ref 135–145)

## 2022-10-07 LAB — VITAMIN D 25 HYDROXY (VIT D DEFICIENCY, FRACTURES): VITD: 33.61 ng/mL (ref 30.00–100.00)

## 2022-10-07 LAB — TSH: TSH: 1.73 u[IU]/mL (ref 0.35–5.50)

## 2022-10-07 MED ORDER — CLOPIDOGREL BISULFATE 75 MG PO TABS: 75.0000 mg | ORAL_TABLET | Freq: Every day | ORAL | 3 refills | Status: DC

## 2022-10-07 NOTE — Progress Notes (Signed)
   Subjective:    Patient ID: REASON BLAN, male    DOB: [birth year], 67 y.o.   MRN: [id number]  HPI HTN- chronic problem, on Amlodipine 4m daily, Metoprolol XL 567mdaily, Olmesartan 4052maily.  No CP, SOB, HA's, visual changes, abd pain, N/V.  Hx of CVA- currently on Plavix, needs refill  Protein calorie malnutrition- pt has gained 10 lbs since last yr.  Pt stopped drinking in September   Review of Systems For ROS see HPI     Objective:   Physical Exam Vitals reviewed.  Constitutional:      General: He is not in acute distress.    Appearance: Normal appearance. He is well-developed. He is not ill-appearing.  HENT:     Head: Normocephalic and atraumatic.  Eyes:     Extraocular Movements: Extraocular movements intact.     Conjunctiva/sclera: Conjunctivae normal.     Pupils: Pupils are equal, round, and reactive to light.  Neck:     Thyroid: No thyromegaly.  Cardiovascular:     Rate and Rhythm: Normal rate and regular rhythm.     Pulses: Normal pulses.     Heart sounds: Normal heart sounds. No murmur heard. Pulmonary:     Effort: Pulmonary effort is normal. No respiratory distress.     Breath sounds: Normal breath sounds.  Abdominal:     General: Bowel sounds are normal. There is no distension.     Palpations: Abdomen is soft.  Musculoskeletal:     Cervical back: Normal range of motion and neck supple.     Right lower leg: No edema.     Left lower leg: No edema.  Lymphadenopathy:     Cervical: No cervical adenopathy.  Skin:    General: Skin is warm and dry.  Neurological:     General: No focal deficit present.     Mental Status: He is alert and oriented to person, place, and time.     Cranial Nerves: No cranial nerve deficit.  Psychiatric:        Mood and Affect: Mood normal.        Behavior: Behavior normal.           Assessment & Plan:

## 2022-10-07 NOTE — Assessment & Plan Note (Signed)
Currently on Plavix, refill sent.  Will continue to follow.

## 2022-10-07 NOTE — Patient Instructions (Signed)
Schedule your complete physical in 6 months We'll notify you of your lab results and make any changes if needed Keep up the good work!  You look great!!! I LOVE the weight gain! CONGRATS on giving up alcohol!  I'm so proud of you!!! Call with any questions or concerns Stay Safe!  Stay Healthy!!

## 2022-10-07 NOTE — Assessment & Plan Note (Signed)
Pt has gained 10 lbs since last year.  He reports he gave up drinking on 05/03/22 and since then has been eating more.  Applauded his decision to quit drinking.  Check labs to assess for any nutritional deficiencies.  Will follow.

## 2022-10-07 NOTE — Assessment & Plan Note (Signed)
Chronic problem.  Well controlled on Amlodipine 92m daily, Metoprolol XL 528mdaily, Olmesartan 406maily.  Tolerating meds w/o difficulty and asymptomatic.  Check labs due to ARB use but no anticipated med changes.

## 2022-10-08 ENCOUNTER — Other Ambulatory Visit: Payer: Self-pay

## 2022-10-08 ENCOUNTER — Telehealth: Payer: Self-pay

## 2022-10-08 DIAGNOSIS — E785 Hyperlipidemia, unspecified: Secondary | ICD-10-CM

## 2022-10-08 MED ORDER — ROSUVASTATIN CALCIUM 10 MG PO TABS: 10.0000 mg | ORAL_TABLET | Freq: Every day | ORAL | 3 refills | Status: DC

## 2022-10-08 NOTE — Telephone Encounter (Signed)
Pt informed and made appt for lab only visit

## 2022-10-08 NOTE — Telephone Encounter (Signed)
Left pt to return our call in regards to his lab results . The Crestor  10 mg and liver function lab has been ordered . Pt will need a lab only visit for 6 to 8 weeks .

## 2022-10-08 NOTE — Telephone Encounter (Signed)
Caller name: Daniel Gonzalez  On DPR?: Yes  Call back number: [phone] (mobile)  Provider they see: Midge Minium, MD  Reason for call: He called back

## 2022-10-08 NOTE — Telephone Encounter (Signed)
-----   Message from Midge Minium, MD sent at 10/08/2022  7:26 AM EST ----- Labs look great w/ exception of total cholesterol and LDL (bad cholesterol) which have both jumped since last check.  Given your history of stroke, I want to get these numbers down to prevent any additional issues.  We will start Crestor 77m nightly (#30, 3 refills) and recheck your liver functions at a lab only visit in 6-8 weeks to make sure the medication is being metabolized appropriately (dx hyperlipidemia)

## 2022-10-17 ENCOUNTER — Other Ambulatory Visit (HOSPITAL_COMMUNITY): Payer: Self-pay

## 2022-10-20 ENCOUNTER — Other Ambulatory Visit (HOSPITAL_COMMUNITY): Payer: Self-pay

## 2022-10-22 ENCOUNTER — Other Ambulatory Visit: Payer: Self-pay

## 2022-10-22 ENCOUNTER — Other Ambulatory Visit (HOSPITAL_COMMUNITY): Payer: Self-pay

## 2022-10-22 DIAGNOSIS — G4700 Insomnia, unspecified: Secondary | ICD-10-CM

## 2022-10-22 MED ORDER — TRAZODONE HCL 100 MG PO TABS: 100.0000 mg | ORAL_TABLET | Freq: Every day | ORAL | 0 refills | Status: DC

## 2022-10-22 NOTE — Telephone Encounter (Signed)
error 

## 2022-11-13 ENCOUNTER — Other Ambulatory Visit (HOSPITAL_COMMUNITY): Payer: Self-pay

## 2022-11-17 ENCOUNTER — Telehealth: Payer: Self-pay | Admitting: Family Medicine

## 2022-11-17 NOTE — Telephone Encounter (Signed)
Can you call pt he will need an apt to be seen

## 2022-11-17 NOTE — Telephone Encounter (Signed)
Okay 

## 2022-11-17 NOTE — Telephone Encounter (Signed)
Caller name: RONDRE POARCH  On DPR?: Yes  Call back number: [phone] (mobile)  Provider they see: Midge Minium, MD  Reason for call:L ear pain back intermittently-advise

## 2022-11-19 ENCOUNTER — Other Ambulatory Visit: Payer: Self-pay

## 2022-11-19 MED ORDER — FAMOTIDINE 40 MG PO TABS: 40.0000 mg | ORAL_TABLET | Freq: Every day | ORAL | 1 refills | Status: DC

## 2022-11-20 ENCOUNTER — Encounter: Payer: Self-pay | Admitting: Family Medicine

## 2022-11-20 ENCOUNTER — Other Ambulatory Visit: Payer: Commercial Managed Care - HMO

## 2022-11-20 ENCOUNTER — Ambulatory Visit (INDEPENDENT_AMBULATORY_CARE_PROVIDER_SITE_OTHER): Payer: Commercial Managed Care - HMO | Admitting: Family Medicine

## 2022-11-20 ENCOUNTER — Telehealth: Payer: Self-pay

## 2022-11-20 VITALS — BP 106/60 | HR 51 | Temp 98.8°F | Resp 16 | Ht 64.0 in | Wt 115.2 lb

## 2022-11-20 DIAGNOSIS — E785 Hyperlipidemia, unspecified: Secondary | ICD-10-CM | POA: Diagnosis not present

## 2022-11-20 DIAGNOSIS — H9502 Recurrent cholesteatoma of postmastoidectomy cavity, left ear: Secondary | ICD-10-CM

## 2022-11-20 DIAGNOSIS — H6122 Impacted cerumen, left ear: Secondary | ICD-10-CM | POA: Diagnosis not present

## 2022-11-20 LAB — HEPATIC FUNCTION PANEL
ALT: 12 U/L (ref 0–53)
AST: 14 U/L (ref 0–37)
Albumin: 4.7 g/dL (ref 3.5–5.2)
Alkaline Phosphatase: 73 U/L (ref 39–117)
Bilirubin, Direct: 0.1 mg/dL (ref 0.0–0.3)
Total Bilirubin: 0.3 mg/dL (ref 0.2–1.2)
Total Protein: 8.2 g/dL (ref 6.0–8.3)

## 2022-11-20 MED ORDER — NEOMYCIN-POLYMYXIN-HC 3.5-10000-1 OT SUSP: 4.0000 [drp] | Freq: Three times a day (TID) | OTIC | 0 refills | Status: DC

## 2022-11-20 NOTE — Progress Notes (Signed)
   Subjective:    Patient ID: Daniel Gonzalez, male    DOB: Sep 20, 1955, 67 y.o.   MRN: [id number]  HPI L ear pain- pt reports he got water in his ear 2 weeks ago.  Pt reports sxs are much better but will have intermittent ear pains.  Some soreness w/ manipulation of pinna.  No drainage.  'got a big ball of wax out of it 2 days ago'.  No fevers.   Review of Systems For ROS see HPI     Objective:   Physical Exam Vitals reviewed.  Constitutional:      General: He is not in acute distress.    Appearance: Normal appearance. He is not ill-appearing.  HENT:     Head: Normocephalic and atraumatic.     Right Ear: Tympanic membrane and ear canal normal. There is no impacted cerumen.     Left Ear: There is impacted cerumen (pt consented to wax removal w/ both curette and irrigation.  Large amount of wax removed but layer of cerumen remained against TM.  Attempts were halted when pt developed discomfort).  Skin:    General: Skin is warm and dry.  Neurological:     General: No focal deficit present.     Mental Status: He is alert and oriented to person, place, and time.  Psychiatric:        Mood and Affect: Mood normal.        Behavior: Behavior normal.        Thought Content: Thought content normal.           Assessment & Plan:  Cerumen impaction- new.  Pt has had issues w/ L ear before and has seen ENT.  Given that he was having pain w/ the impaction, concern for otitis externa.  Will start abx drops.  Reviewed supportive care and red flags that should prompt return.  Pt expressed understanding and is in agreement w/ plan.

## 2022-11-20 NOTE — Telephone Encounter (Signed)
Left lab results on pt VM 

## 2022-11-20 NOTE — Patient Instructions (Signed)
Follow up as needed or as scheduled USE the ear drops- 4 drops 3x/day- as directed.  Do not use for longer than 7 days The drops will treat any infection and also help dissolve any remaining wax Call with any questions or concerns [place]!!

## 2022-11-20 NOTE — Telephone Encounter (Signed)
-----   Message from Midge Minium, MD sent at 11/20/2022  3:45 PM EDT ----- Liver functions look great!  No changes at this time

## 2022-12-01 ENCOUNTER — Other Ambulatory Visit: Payer: Self-pay | Admitting: Gastroenterology

## 2022-12-03 ENCOUNTER — Ambulatory Visit (INDEPENDENT_AMBULATORY_CARE_PROVIDER_SITE_OTHER): Payer: Commercial Managed Care - HMO | Admitting: Family Medicine

## 2022-12-03 ENCOUNTER — Encounter: Payer: Self-pay | Admitting: Family Medicine

## 2022-12-03 VITALS — BP 114/60 | HR 57 | Temp 98.5°F | Resp 16 | Ht 64.0 in | Wt 114.0 lb

## 2022-12-03 DIAGNOSIS — J309 Allergic rhinitis, unspecified: Secondary | ICD-10-CM | POA: Diagnosis not present

## 2022-12-03 DIAGNOSIS — H1013 Acute atopic conjunctivitis, bilateral: Secondary | ICD-10-CM | POA: Diagnosis not present

## 2022-12-03 DIAGNOSIS — H6122 Impacted cerumen, left ear: Secondary | ICD-10-CM

## 2022-12-03 MED ORDER — CETIRIZINE HCL 10 MG PO TABS: 10.0000 mg | ORAL_TABLET | Freq: Every day | ORAL | 11 refills | Status: DC

## 2022-12-03 MED ORDER — OLOPATADINE HCL 0.2 % OP SOLN: 1.0000 [drp] | Freq: Every day | OPHTHALMIC | 3 refills | Status: DC

## 2022-12-03 NOTE — Patient Instructions (Addendum)
Follow up as needed or as scheduled We got almost all of the wax out of your ear START the Cetirizine daily to help your nasal congestion and ear pressure START the Olopatadine eye drops- 1 drop in each eye daily Call with any questions or concerns Hang in there!!

## 2022-12-03 NOTE — Progress Notes (Signed)
   Subjective:    Patient ID: Daniel Gonzalez, male    DOB: 07/26/1956, 67 y.o.   MRN: [id number]  HPI L ear congestion- pt reports pain is gone but remains stopped up.  'some mornings are worse than others'  Eye irritation- pt reports itching, clear drainage, sensitivity to light, difficulty wearing contacts.     Review of Systems For ROS see HPI     Objective:   Physical Exam Vitals reviewed.  Constitutional:      General: He is not in acute distress.    Appearance: Normal appearance. He is well-developed. He is not ill-appearing.  HENT:     Head: Normocephalic and atraumatic.     Right Ear: Tympanic membrane and ear canal normal.     Left Ear: There is impacted cerumen (pt consented to removal w/ curette and nearly all wax was removed w/o difficulty.  pt tolerated w/o difficulty.  TM retracted).     Nose: Congestion present.     Right Turbinates: Enlarged and swollen.     Left Turbinates: Enlarged and swollen.     Right Sinus: No maxillary sinus tenderness or frontal sinus tenderness.     Left Sinus: No maxillary sinus tenderness or frontal sinus tenderness.  Eyes:     General: Lids are normal.        Right eye: No discharge.        Left eye: No discharge.     Extraocular Movements:     Right eye: Normal extraocular motion.     Left eye: Normal extraocular motion.     Conjunctiva/sclera: Conjunctivae normal.     Right eye: Right conjunctiva is not injected. No exudate.    Left eye: Left conjunctiva is not injected. No exudate.    Pupils: Pupils are equal, round, and reactive to light.  Cardiovascular:     Rate and Rhythm: Normal rate and regular rhythm.     Heart sounds: Normal heart sounds.  Pulmonary:     Effort: Pulmonary effort is normal. No respiratory distress.     Breath sounds: Normal breath sounds. No wheezing.  Musculoskeletal:     Cervical back: Normal range of motion and neck supple.  Lymphadenopathy:     Cervical: No cervical adenopathy.  Skin:     General: Skin is warm and dry.  Neurological:     General: No focal deficit present.     Mental Status: He is alert and oriented to person, place, and time.  Psychiatric:        Mood and Affect: Mood normal.        Behavior: Behavior normal.        Thought Content: Thought content normal.           Assessment & Plan:  Cerumen impaction- pt has recurrent issues w/ L ear.  Wax was successfully removed w/ curette and pt tolerated w/o difficulty.  TM was visibly retracted which is the 'stopped up' feeling he is referring to.  Will treat underlying seasonal allergies to provide relief.  Pt expressed understanding and is in agreement w/ plan.   Allergic conjunctivitis and rhinitis- new.  The gritty feeling in his eyes, clear drainage and sensitivity to light are consistent w/ allergic conjunctivitis.  His retracted TM and congested nose are consistent w/ rhinitis.  Start Pataday and Zyrtec.  Pt expressed understanding and is in agreement w/ plan.

## 2022-12-11 ENCOUNTER — Other Ambulatory Visit (HOSPITAL_COMMUNITY): Payer: Self-pay

## 2022-12-12 ENCOUNTER — Telehealth: Payer: Self-pay | Admitting: Family Medicine

## 2022-12-12 NOTE — Telephone Encounter (Signed)
t states L ear is still plugged can he have a referral for a ear specialist.    And the eyedrops (Olopatadine HCl 0.2 % SOLN) improved his situation but he is out of them. He'd like a refill.  I explained to the pt that Dr Beverely Low was out of the office today . He was ok with waiting on her to return

## 2022-12-12 NOTE — Telephone Encounter (Signed)
Caller name: KEIDRICK MURTY  On DPR?: Yes  Call back number: [phone] (mobile)  Provider they see: Sheliah Hatch, MD  Reason for call: Pt states L ear is still plugged can he have a referral for a ear specialist.   And the eyedrops (Olopatadine HCl 0.2 % SOLN) improved his situation but he is out of them. He'd like a refill.

## 2022-12-15 ENCOUNTER — Other Ambulatory Visit: Payer: Self-pay

## 2022-12-15 DIAGNOSIS — H9209 Otalgia, unspecified ear: Secondary | ICD-10-CM

## 2022-12-15 NOTE — Telephone Encounter (Signed)
Ok for ENT referral- dx L ear pain

## 2022-12-15 NOTE — Telephone Encounter (Signed)
Referral has been placed . Pt is aware

## 2022-12-16 ENCOUNTER — Other Ambulatory Visit (HOSPITAL_COMMUNITY): Payer: Self-pay

## 2022-12-22 ENCOUNTER — Other Ambulatory Visit: Payer: Self-pay

## 2022-12-22 DIAGNOSIS — I10 Essential (primary) hypertension: Secondary | ICD-10-CM

## 2022-12-22 MED ORDER — METOPROLOL SUCCINATE ER 50 MG PO TB24
50.0000 mg | ORAL_TABLET | Freq: Every day | ORAL | 3 refills | Status: DC
Start: 2022-12-22 — End: 2023-12-21

## 2022-12-22 MED ORDER — AMLODIPINE BESYLATE 10 MG PO TABS
10.0000 mg | ORAL_TABLET | Freq: Every day | ORAL | 1 refills | Status: DC
Start: 2022-12-22 — End: 2023-06-24

## 2022-12-29 ENCOUNTER — Other Ambulatory Visit: Payer: Self-pay

## 2022-12-29 MED ORDER — OLMESARTAN MEDOXOMIL 40 MG PO TABS: 40.0000 mg | ORAL_TABLET | Freq: Every day | ORAL | 0 refills | Status: DC

## 2023-01-08 ENCOUNTER — Other Ambulatory Visit (HOSPITAL_COMMUNITY): Payer: Self-pay

## 2023-01-13 ENCOUNTER — Other Ambulatory Visit: Payer: Self-pay

## 2023-01-13 ENCOUNTER — Other Ambulatory Visit (HOSPITAL_COMMUNITY): Payer: Self-pay

## 2023-01-13 DIAGNOSIS — E785 Hyperlipidemia, unspecified: Secondary | ICD-10-CM

## 2023-01-13 MED ORDER — ROSUVASTATIN CALCIUM 10 MG PO TABS
10.0000 mg | ORAL_TABLET | Freq: Every day | ORAL | 6 refills | Status: DC
Start: 2023-01-13 — End: 2023-08-18

## 2023-01-27 ENCOUNTER — Encounter: Payer: Self-pay | Admitting: Internal Medicine

## 2023-01-27 ENCOUNTER — Other Ambulatory Visit: Payer: Self-pay

## 2023-01-27 DIAGNOSIS — G4700 Insomnia, unspecified: Secondary | ICD-10-CM

## 2023-01-27 MED ORDER — TRAZODONE HCL 100 MG PO TABS
100.0000 mg | ORAL_TABLET | Freq: Every day | ORAL | 0 refills | Status: DC
Start: 2023-01-27 — End: 2023-04-27

## 2023-01-27 NOTE — Telephone Encounter (Signed)
Patient is requesting a refill of the following medications: Requested Prescriptions   Pending Prescriptions Disp Refills   traZODone (DESYREL) 100 MG tablet 90 tablet 0    Sig: Take 1 tablet (100 mg total) by mouth at bedtime.    Date of patient request: 01/22/23 Last office visit: 12/03/22 Date of last refill: 10/22/22 Last refill amount: 90 Follow up time period per chart: August

## 2023-01-29 ENCOUNTER — Telehealth: Payer: Self-pay | Admitting: Family Medicine

## 2023-01-29 ENCOUNTER — Other Ambulatory Visit (HOSPITAL_COMMUNITY): Payer: Self-pay

## 2023-01-29 NOTE — Telephone Encounter (Signed)
Caller name: TERRON MERFELD  On DPR?: Yes  Call back number: [phone] (mobile)  Provider they see: Sheliah Hatch, MD  Reason for call:   Pt states eye drops aren't working he'd like something stronger. Both contacts are in but R eye has white discharge coming from eye. ABX is working on cough much better. The eyes drops only last 3-4 days, he only gets a few drops and the bottle is empty and he has to wait to a certain date he's confused why?

## 2023-01-29 NOTE — Telephone Encounter (Signed)
Patient reports eye drop supply is not lasting and notes not strong enough, please advise

## 2023-01-29 NOTE — Telephone Encounter (Signed)
He is to only use 1 drop of the pataday in each eye daily.  The bottle should have enough drops to last the entire month if only using 1 drop in each eye.  As for his current sxs, I have not seem him since April.  If he is having new discharge that seems to be infected, he needs an appt.  Since he has contacts, I would suggest he needs to see his eye doctor ASAP to ensure the eye or the contact itself is not infected.

## 2023-01-30 ENCOUNTER — Ambulatory Visit (INDEPENDENT_AMBULATORY_CARE_PROVIDER_SITE_OTHER): Payer: Commercial Managed Care - HMO | Admitting: Family Medicine

## 2023-01-30 ENCOUNTER — Encounter: Payer: Self-pay | Admitting: Family Medicine

## 2023-01-30 VITALS — BP 102/60 | HR 59 | Temp 98.9°F | Ht 64.0 in | Wt 113.4 lb

## 2023-01-30 DIAGNOSIS — H109 Unspecified conjunctivitis: Secondary | ICD-10-CM

## 2023-01-30 DIAGNOSIS — B9689 Other specified bacterial agents as the cause of diseases classified elsewhere: Secondary | ICD-10-CM

## 2023-01-30 MED ORDER — ERYTHROMYCIN 5 MG/GM OP OINT
1.0000 | TOPICAL_OINTMENT | Freq: Four times a day (QID) | OPHTHALMIC | 0 refills | Status: AC
Start: 2023-01-30 — End: 2023-02-06

## 2023-01-30 NOTE — Patient Instructions (Addendum)
-  It was a pleasure to meet you. -Recommend to STOP using the Olopatadine drops and START Erythromycin Eye Ointment. Apply 1cm ribbon in each eye 4 times a days. Recommend to not wear contacts while using ointment.  -Recommend to make an appointment with your eye doctor and to obtain a back up pair of glasses when you have eye complaints.

## 2023-01-30 NOTE — Telephone Encounter (Signed)
Left vm to call office

## 2023-01-30 NOTE — Progress Notes (Signed)
Acute Office Visit   Subjective:  Patient ID: Daniel Gonzalez, male    DOB: May 29, 1956, 67 y.o.   MRN: [id number]  Chief Complaint  Patient presents with   Eye Pain    Pt is here today with C/O of eye pain. Pt reports he can wear his left contact and can not wear right eye contact. Pt reports this is no better since 11/2022   HPI Patient is a 67 year old [place] American male that presents with bilateral pain and drainage from both eyes. Patient reports he can wear his left contact but not his right contact.   Patient was seen by his primary care, Dr. Kirtland Bouchard. [place] on 12/02/2021. Dx with allergic conjunctivitis of both eyes. He was prescribed Olopatadine 1 drop in each eye daily.   He reports he is having the same symptoms, reports it is a slimy  white discharge, occurs all day. He denies any vision change and blurry vision.   Review of Systems  Eyes:  Positive for pain.   See HPI above      Objective:   BP 102/60   Pulse (!) 59   Temp 98.9 F (37.2 C)   Ht 5\' 4"  (1.626 m)   Wt 113 lb 6 oz (51.4 kg)   SpO2 98%   BMI 19.46 kg/m    Physical Exam Vitals reviewed.  Constitutional:      General: He is not in acute distress.    Appearance: Normal appearance. He is not ill-appearing, toxic-appearing or diaphoretic.  HENT:     Head: Normocephalic and atraumatic.  Eyes:     General: Lids are normal. Vision grossly intact.        Right eye: Discharge (Clear watery discharge) present.        Left eye: Discharge (Clear white discharge) present.    Extraocular Movements:     Right eye: Normal extraocular motion.     Left eye: Normal extraocular motion.     Pupils: Pupils are equal, round, and reactive to light.     Comments: He has a wash cloth with him that he keeps wiping his eyes with.  Mild erythema-sclerae in both eyes   Cardiovascular:     Rate and Rhythm: Normal rate and regular rhythm.     Heart sounds: Normal heart sounds. No murmur heard.    No friction rub. No  gallop.  Pulmonary:     Effort: Pulmonary effort is normal. No respiratory distress.     Breath sounds: Normal breath sounds.  Musculoskeletal:        General: Normal range of motion.  Skin:    General: Skin is warm and dry.  Neurological:     General: No focal deficit present.     Mental Status: He is alert and oriented to person, place, and time. Mental status is at baseline.  Psychiatric:        Mood and Affect: Mood normal.        Behavior: Behavior normal.        Thought Content: Thought content normal.        Judgment: Judgment normal.       Assessment & Plan:  Bacterial conjunctivitis of both eyes -     Erythromycin; Place 1 Application into both eyes 4 (four) times daily for 7 days.  Dispense: 28 g; Refill: 0  -Recommend to STOP using the Olopatadine drops and START Erythromycin Eye Ointment. Apply 1cm ribbon in each eye 4 times a days.  Recommend to not wear contacts while using ointment.  -Recommend to make an appointment with your eye doctor and to obtain a back up pair of glasses when he has eye complaints.    Zandra Abts, NP

## 2023-02-09 ENCOUNTER — Other Ambulatory Visit (HOSPITAL_COMMUNITY): Payer: Self-pay

## 2023-02-10 ENCOUNTER — Other Ambulatory Visit: Payer: Self-pay

## 2023-03-03 ENCOUNTER — Other Ambulatory Visit (HOSPITAL_COMMUNITY): Payer: Self-pay

## 2023-03-09 ENCOUNTER — Encounter (HOSPITAL_COMMUNITY): Payer: Self-pay

## 2023-03-09 ENCOUNTER — Other Ambulatory Visit (HOSPITAL_COMMUNITY): Payer: Self-pay

## 2023-03-17 ENCOUNTER — Other Ambulatory Visit: Payer: Self-pay

## 2023-03-18 ENCOUNTER — Encounter (INDEPENDENT_AMBULATORY_CARE_PROVIDER_SITE_OTHER): Payer: Self-pay

## 2023-03-31 ENCOUNTER — Other Ambulatory Visit: Payer: Self-pay

## 2023-03-31 MED ORDER — OLMESARTAN MEDOXOMIL 40 MG PO TABS: 40.0000 mg | ORAL_TABLET | Freq: Every day | ORAL | 1 refills | Status: DC

## 2023-04-07 ENCOUNTER — Telehealth: Payer: Self-pay | Admitting: Family Medicine

## 2023-04-07 ENCOUNTER — Ambulatory Visit: Payer: Commercial Managed Care - HMO | Admitting: Family Medicine

## 2023-04-07 ENCOUNTER — Encounter: Payer: Self-pay | Admitting: Family Medicine

## 2023-04-07 VITALS — BP 102/58 | HR 100 | Temp 97.9°F | Resp 17 | Ht 64.0 in | Wt 121.0 lb

## 2023-04-07 DIAGNOSIS — I10 Essential (primary) hypertension: Secondary | ICD-10-CM

## 2023-04-07 DIAGNOSIS — R058 Other specified cough: Secondary | ICD-10-CM | POA: Diagnosis not present

## 2023-04-07 DIAGNOSIS — C61 Malignant neoplasm of prostate: Secondary | ICD-10-CM

## 2023-04-07 DIAGNOSIS — Z0000 Encounter for general adult medical examination without abnormal findings: Secondary | ICD-10-CM | POA: Diagnosis not present

## 2023-04-07 LAB — BASIC METABOLIC PANEL
BUN: 11 mg/dL (ref 6–23)
CO2: 25 mEq/L (ref 19–32)
Calcium: 9.7 mg/dL (ref 8.4–10.5)
Chloride: 107 mEq/L (ref 96–112)
Creatinine, Ser: 1.1 mg/dL (ref 0.40–1.50)
GFR: 70.76 mL/min (ref >60.00)
Glucose, Bld: 104 mg/dL — ABNORMAL HIGH (ref 70–99)
Potassium: 4.1 mEq/L (ref 3.5–5.1)
Sodium: 139 mEq/L (ref 135–145)

## 2023-04-07 LAB — CBC WITH DIFFERENTIAL/PLATELET
Basophils Absolute: 0 10*3/uL (ref 0.0–0.1)
Basophils Relative: 0.3 % (ref 0.0–3.0)
Eosinophils Absolute: 0.1 10*3/uL (ref 0.0–0.7)
Eosinophils Relative: 1.2 % (ref 0.0–5.0)
HCT: 38.1 % — ABNORMAL LOW (ref 39.0–52.0)
Hemoglobin: 12.5 g/dL — ABNORMAL LOW (ref 13.0–17.0)
Lymphocytes Relative: 45.7 % (ref 12.0–46.0)
Lymphs Abs: 3.2 10*3/uL (ref 0.7–4.0)
MCHC: 32.8 g/dL (ref 30.0–36.0)
MCV: 96.4 fl (ref 78.0–100.0)
Monocytes Absolute: 0.8 10*3/uL (ref 0.1–1.0)
Monocytes Relative: 11.8 % (ref 3.0–12.0)
Neutro Abs: 2.8 10*3/uL (ref 1.4–7.7)
Neutrophils Relative %: 41 % — ABNORMAL LOW (ref 43.0–77.0)
Platelets: 253 10*3/uL (ref 150.0–400.0)
RBC: 3.95 Mil/uL — ABNORMAL LOW (ref 4.22–5.81)
RDW: 15.3 % (ref 11.5–15.5)
WBC: 6.9 10*3/uL (ref 4.0–10.5)

## 2023-04-07 LAB — HEPATIC FUNCTION PANEL
ALT: 13 U/L (ref 0–53)
AST: 15 U/L (ref 0–37)
Albumin: 4.4 g/dL (ref 3.5–5.2)
Alkaline Phosphatase: 68 U/L (ref 39–117)
Bilirubin, Direct: 0.1 mg/dL (ref 0.0–0.3)
Total Bilirubin: 0.3 mg/dL (ref 0.2–1.2)
Total Protein: 7.7 g/dL (ref 6.0–8.3)

## 2023-04-07 LAB — LIPID PANEL
Cholesterol: 145 mg/dL (ref 0–200)
HDL: 38.5 mg/dL — ABNORMAL LOW (ref >39.00)
LDL Cholesterol: 73 mg/dL (ref 0–99)
NonHDL: 106.99
Total CHOL/HDL Ratio: 4
Triglycerides: 171 mg/dL — ABNORMAL HIGH (ref 0.0–149.0)
VLDL: 34.2 mg/dL (ref 0.0–40.0)

## 2023-04-07 LAB — TSH: TSH: 2.62 u[IU]/mL (ref 0.35–5.50)

## 2023-04-07 MED ORDER — AZITHROMYCIN 250 MG PO TABS: ORAL_TABLET | ORAL | 0 refills | Status: DC

## 2023-04-07 NOTE — Patient Instructions (Addendum)
Follow up in 6 months to recheck BP We'll notify you of your lab results and make any changes if needed Take the Zpack as directed for the cough Call GI and schedule your appt [phone] If the eyes continue to bother you after the antibiotics, let me know and we'll get you in with a 2nd opinion Keep up the good work on your eating!  I love the extra pounds! Call with any questions or concerns Hang in there!!!

## 2023-04-07 NOTE — Progress Notes (Signed)
   Subjective:    Patient ID: Daniel Gonzalez, male    DOB: 1956/05/03, 67 y.o.   MRN: [id number]  HPI CPE- due for repeat colonoscopy.  UTD on Tdap  Patient Care Team    Relationship Specialty Notifications Start End  Sheliah Hatch, MD PCP - General Family Medicine  09/16/12   Cliffton Asters, MD (Inactive) PCP - Infectious Diseases Infectious Diseases  07/31/10   Felicita Gage, RN Oncology Nurse Navigator   12/04/20     Health Maintenance  Topic Date Due   Colonoscopy  01/08/2023   INFLUENZA VACCINE  03/19/2023   DTaP/Tdap/Td (2 - Td or Tdap) 12/13/2025   Hepatitis C Screening  Completed   HIV Screening  Completed   HPV VACCINES  Aged Out   COVID-19 Vaccine  Discontinued   Zoster Vaccines- Shingrix  Discontinued      Review of Systems Patient reports no hearing changes, anorexia, fever ,adenopathy, persistant/recurrent hoarseness, swallowing issues, chest pain, palpitations, edema, hemoptysis, dyspnea (rest,exertional, paroxysmal nocturnal), gastrointestinal  bleeding (melena, rectal bleeding), abdominal pain, excessive heart burn, GU symptoms (dysuria, hematuria, voiding/incontinence issues) syncope, focal weakness, memory loss, numbness & tingling, skin/hair/nail changes, depression, anxiety, abnormal bruising/bleeding, musculoskeletal symptoms/signs.   + continued eye irritation- Dr Randa Evens on [address] + ongoing productive cough since March    Objective:   Physical Exam General Appearance:    Alert, cooperative, no distress, appears stated age  Head:    Normocephalic, without obvious abnormality, atraumatic  Eyes:    PERRL, conjunctiva injected, EOM's intact both eyes       Ears:    Normal TM's and external ear canals, both ears  Nose:   Nares normal, septum midline, mucosa normal, no drainage   or sinus tenderness  Throat:   Lips, mucosa, and tongue normal; teeth and gums normal  Neck:   Supple, symmetrical, trachea midline, no adenopathy;       thyroid:  No  enlargement/tenderness/nodules  Back:     Symmetric, no curvature, ROM normal, no CVA tenderness  Lungs:     Clear to auscultation bilaterally, respirations unlabored  Chest wall:    No tenderness or deformity  Heart:    Regular rate and rhythm, S1 and S2 normal, no murmur, rub   or gallop  Abdomen:     Soft, non-tender, bowel sounds active all four quadrants,    no masses, no organomegaly  Genitalia:    Deferred to urology  Rectal:    Extremities:   Extremities normal, atraumatic, no cyanosis or edema  Pulses:   2+ and symmetric all extremities  Skin:   Skin color, texture, turgor normal, no rashes or lesions  Lymph nodes:   Cervical, supraclavicular, and axillary nodes normal  Neurologic:   CNII-XII intact. Normal strength, sensation and reflexes      throughout          Assessment & Plan:  Productive cough- ongoing issue for pt.  No improvement after months.  Recent increase in community pertussis- will start Zpack.  Pt expressed understanding and is in agreement w/ plan.

## 2023-04-07 NOTE — Assessment & Plan Note (Signed)
Following w/ Urology ?

## 2023-04-07 NOTE — Telephone Encounter (Signed)
Pt is here for his apt with Dr Beverely Low  will address at visit

## 2023-04-07 NOTE — Telephone Encounter (Signed)
Caller name: TORRANCE HON  On DPR?: Yes  Call back number: [phone] (mobile)  Provider they see: Sheliah Hatch, MD  Reason for call:  Pt states he's been to the eye doctor multiple times and still can't wear his contacts. He's concerned about the infection in his body affecting his eyes. He doesn't feel this is a physical should be a office visit - advise

## 2023-04-08 ENCOUNTER — Telehealth: Payer: Self-pay

## 2023-04-08 NOTE — Telephone Encounter (Signed)
Pt aware of lab results 

## 2023-04-08 NOTE — Telephone Encounter (Signed)
-----   Message from Neena Rhymes sent at 04/08/2023  7:47 AM EDT ----- Labs are stable and look good!  No changes at this time

## 2023-04-09 ENCOUNTER — Other Ambulatory Visit (HOSPITAL_COMMUNITY): Payer: Self-pay

## 2023-04-14 ENCOUNTER — Other Ambulatory Visit: Payer: Self-pay | Admitting: Internal Medicine

## 2023-04-14 ENCOUNTER — Other Ambulatory Visit (HOSPITAL_COMMUNITY): Payer: Self-pay

## 2023-04-14 ENCOUNTER — Telehealth: Payer: Self-pay | Admitting: Family Medicine

## 2023-04-14 DIAGNOSIS — H109 Unspecified conjunctivitis: Secondary | ICD-10-CM

## 2023-04-14 NOTE — Telephone Encounter (Signed)
Still having the same problem and he stated that he discussed getting a 2nd opinion and wants to know if he can be referred to whoever he needs to see. He stated the antibiotic made the issue better but it didn't resolve it.

## 2023-04-14 NOTE — Telephone Encounter (Signed)
Follow up 8/28

## 2023-04-14 NOTE — Telephone Encounter (Signed)
[facility] [place] Night - C Client Site [facility] - Night Provider Lezlie Octave- MD Contact Type Call Who Is Calling Patient / Member / Family / Caregiver Caller Name Manolo Seiler Caller Phone Number [phone] Patient Name Daniel Gonzalez Patient DOB [birth year] Call Type Message Only Information Provided Reason for Call Request for General Office Information Initial Comment Caller states that he was in office last week and has an infection that keeps infecting his eyes. Wants to know if he can have a referral to a specialist from Dr. Beverely Low. Additional Comment Provided office hours. Disp. Time Disposition Final User 04/13/2023 1:31:18 PM General Information Provided Yes Marianne Sofia Call Closed By: Marianne Sofia Transaction Date/Time: 04/13/2023 1:27:34 PM (ET)

## 2023-04-15 ENCOUNTER — Other Ambulatory Visit (HOSPITAL_COMMUNITY): Payer: Self-pay

## 2023-04-15 ENCOUNTER — Ambulatory Visit: Payer: Commercial Managed Care - HMO | Admitting: Internal Medicine

## 2023-04-15 ENCOUNTER — Other Ambulatory Visit: Payer: Self-pay

## 2023-04-15 ENCOUNTER — Ambulatory Visit
Admission: RE | Admit: 2023-04-15 | Discharge: 2023-04-15 | Disposition: A | Discharge status: Home / Self Care | Payer: Commercial Managed Care - HMO | Source: Ambulatory Visit | Attending: Internal Medicine | Admitting: Internal Medicine

## 2023-04-15 ENCOUNTER — Ambulatory Visit (INDEPENDENT_AMBULATORY_CARE_PROVIDER_SITE_OTHER): Payer: Managed Care, Other (non HMO) | Admitting: Internal Medicine

## 2023-04-15 ENCOUNTER — Ambulatory Visit: Payer: Commercial Managed Care - HMO

## 2023-04-15 VITALS — BP 112/71 | HR 77 | Resp 16 | Ht 64.0 in | Wt 120.3 lb

## 2023-04-15 DIAGNOSIS — B20 Human immunodeficiency virus [HIV] disease: Secondary | ICD-10-CM

## 2023-04-15 DIAGNOSIS — Z23 Encounter for immunization: Secondary | ICD-10-CM | POA: Diagnosis not present

## 2023-04-15 MED ORDER — PREZCOBIX 800-150 MG PO TABS
ORAL_TABLET | ORAL | 5 refills | Status: DC
  Filled 2023-04-15: qty 30, 30d supply, fill #0
  Filled 2023-05-07: qty 30, 30d supply, fill #1
  Filled 2023-06-04: qty 30, 30d supply, fill #2

## 2023-04-15 MED ORDER — BIKTARVY 50-200-25 MG PO TABS
ORAL_TABLET | ORAL | 5 refills | Status: DC
  Filled 2023-04-15: qty 30, 30d supply, fill #0
  Filled 2023-05-07: qty 30, 30d supply, fill #1
  Filled 2023-06-04: qty 30, 30d supply, fill #2

## 2023-04-15 NOTE — Telephone Encounter (Signed)
Pt aware a stat referral has been placed

## 2023-04-15 NOTE — Telephone Encounter (Signed)
Stat referral placed for ophthalmology.

## 2023-04-15 NOTE — Progress Notes (Signed)
[facility]     HPI: Daniel Gonzalez is a 67 y.o. male management for HIV. Date of diagnosis Early 75s, late 66s ART exposure  Kaletra, viread, combivir, juluca, [name] Past ZOX:WRUE that he knows off Risk factors: heterosexual Partners in last 2months 00, in the last 12 months0.  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 Family doesn't know about HIV Understanding of HIV: Etoh/drug/tobacco use: Sober 1 year ago/ MJ/yes Past Medical History:  Diagnosis Date   Anal fistula    Aortic atherosclerosis (HCC)    Diverticulosis    GERD (gastroesophageal reflux disease)    Hiatal hernia    HIV infection (HCC)    Hypertension    Malignant neoplasm of prostate (HCC) 06/19/2020   Pancreatic cyst    Pneumonia yrs ago   "walking pneumonia"   Prostate cancer (HCC)    Stroke (HCC) yrs ago   no current neurlogist   Tubular adenoma of colon    Wears contact lenses    Wears dentures    upper full lower partial    Past Surgical History:  Procedure Laterality Date   ? seton placement 10 years ago      BIOPSY  06/02/2022   Procedure: BIOPSY;  Surgeon: Mansouraty, Netty Starring., MD;  Location: [facility];  Service: Gastroenterology;;   COLONOSCOPY     cyst in back     removed from back   ENDOSCOPIC MUCOSAL RESECTION N/A 07/31/2022   Procedure: ENDOSCOPIC MUCOSAL RESECTION;  Surgeon: Lemar Lofty., MD;  Location: [facility];  Service: Gastroenterology;  Laterality: N/A;   ESOPHAGOGASTRODUODENOSCOPY N/A 06/02/2022   Procedure: ESOPHAGOGASTRODUODENOSCOPY (EGD);  Surgeon: Lemar Lofty., MD;  Location: [facility];  Service: Gastroenterology;  Laterality: N/A;   ESOPHAGOGASTRODUODENOSCOPY (EGD) WITH PROPOFOL N/A 07/31/2022   Procedure: ESOPHAGOGASTRODUODENOSCOPY (EGD) WITH  PROPOFOL;  Surgeon: Meridee Score Netty Starring., MD;  Location: [facility];  Service: Gastroenterology;  Laterality: N/A;   EUS N/A 06/02/2022   Procedure: UPPER ENDOSCOPIC ULTRASOUND (EUS) RADIAL;  Surgeon: Lemar Lofty., MD;  Location: [facility];  Service: Gastroenterology;  Laterality: N/A;   FINE NEEDLE ASPIRATION N/A 06/02/2022   Procedure: FINE NEEDLE ASPIRATION (FNA) LINEAR;  Surgeon: Lemar Lofty., MD;  Location: [facility];  Service: Gastroenterology;  Laterality: N/A;   HEMOSTASIS CLIP PLACEMENT  07/31/2022   Procedure: HEMOSTASIS CLIP PLACEMENT;  Surgeon: Lemar Lofty., MD;  Location: [facility];  Service: Gastroenterology;;   PROSTATE BIOPSY  2021   RADIOACTIVE SEED IMPLANT N/A 09/10/2020   Procedure: RADIOACTIVE SEED IMPLANT/BRACHYTHERAPY IMPLANT;  Surgeon: Crista Elliot, MD;  Location: [facility];  Service: Urology;  Laterality: N/A;   SPACE OAR INSTILLATION N/A 09/10/2020   Procedure: SPACE OAR INSTILLATION;  Surgeon: Crista Elliot, MD;  Location: [facility];  Service: Urology;  Laterality: N/A;   SUBMUCOSAL LIFTING INJECTION  07/31/2022   Procedure: SUBMUCOSAL LIFTING INJECTION;  Surgeon: Meridee Score Netty Starring., MD;  Location: [facility];  Service: Gastroenterology;;   SUBMUCOSAL TATTOO INJECTION  07/31/2022   Procedure: SUBMUCOSAL TATTOO INJECTION;  Surgeon: Lemar Lofty., MD;  Location: [facility];  Service: Gastroenterology;;   TEE WITHOUT CARDIOVERSION N/A 06/24/2013   Procedure: TRANSESOPHAGEAL ECHOCARDIOGRAM (TEE);  Surgeon: Dolores Patty, MD;  Location[facility];  Service: Cardiovascular;  Laterality: N/A;  Family History  Problem Relation Age of Onset   Arthritis Mother    Heart disease Mother    Hypertension Mother    Diabetes Mother    Arthritis Father    Heart disease Father    Hypertension Father    Cancer Father    Lung cancer Brother    Prostate cancer  Brother    Prostate cancer Brother    Colon cancer Neg Hx    Pancreatic cancer Neg Hx    Breast cancer Neg Hx    Esophageal cancer Neg Hx    Rectal cancer Neg Hx    Ovarian cancer Neg Hx    Stomach cancer Neg Hx    Current Outpatient Medications on File Prior to Visit  Medication Sig Dispense Refill   acetaminophen (TYLENOL) 650 MG CR tablet Take 1,300 mg by mouth in the morning and at bedtime.     amLODipine (NORVASC) 10 MG tablet Take 1 tablet (10 mg total) by mouth daily. 90 tablet 1   bictegravir-emtricitabine-tenofovir AF (BIKTARVY) 50-200-25 MG TABS tablet Take 1 tablet by mouth daily. 30 tablet 11   bictegravir-emtricitabine-tenofovir AF (BIKTARVY) 50-200-25 MG TABS tablet Take 1 table by mouth daily 30 tablet 11   cetirizine (ZYRTEC) 10 MG tablet Take 1 tablet (10 mg total) by mouth daily. 30 tablet 11   clopidogrel (PLAVIX) 75 MG tablet Take 1 tablet (75 mg total) by mouth daily. 90 tablet 3   darunavir-cobicistat ([name]) 800-150 MG tablet Take 1 tablet by mouth daily with food. do not  crush, break or chew tablets 30 tablet 11   metoprolol succinate (TOPROL-XL) 50 MG 24 hr tablet Take 1 tablet (50 mg total) by mouth daily. Take with or immediately following a meal. 90 tablet 3   Misc. Devices (PILL SPLITTER) MISC Please use to cut trazodone in 1/2. 1 each 0   Multiple Vitamins-Minerals (MULTIVITAMIN MEN) TABS Take 1 tablet by mouth daily.     neomycin-polymyxin-hydrocortisone (CORTISPORIN) 3.5-10000-1 OTIC suspension Place 4 drops into the left ear 3 (three) times daily. Do not use longer than 7 days 10 mL 0   olmesartan (BENICAR) 40 MG tablet Take 1 tablet (40 mg total) by mouth daily. 90 tablet 1   Olopatadine HCl 0.2 % SOLN Apply 1 drop to eye daily. 2.5 mL 3   omeprazole (PRILOSEC OTC) 20 MG tablet Take 2 tablets (40 mg total) by mouth 2 (two) times daily before a meal. 60 tablet 6   omeprazole (PRILOSEC) 20 MG capsule TAKE 1 CAPSULE BY MOUTH TWICE A DAY BEFORE A MEAL 180  capsule 1   Propylene Glycol (SYSTANE BALANCE) 0.6 % SOLN Place 1 drop into both eyes as needed (dry eyes).     rosuvastatin (CRESTOR) 10 MG tablet Take 1 tablet (10 mg total) by mouth daily. 30 tablet 6   sildenafil (VIAGRA) 100 MG tablet Take 1 tablet (100 mg total) by mouth daily as needed for erectile dysfunction. 90 tablet 0   tamsulosin (FLOMAX) 0.4 MG CAPS capsule Take 0.4 mg by mouth in the morning and at bedtime.     traZODone (DESYREL) 100 MG tablet Take 1 tablet (100 mg total) by mouth at bedtime. 90 tablet 0   vitamin B-12 (CYANOCOBALAMIN) 1000 MCG tablet Take 1 tablet (1,000 mcg total) by mouth daily.     azithromycin (ZITHROMAX) 250 MG tablet 2 tabs on day 1, 1 tab on day 2-5 (Patient not taking: Reported on 04/15/2023) 6 tablet 0   famotidine (PEPCID) 40  MG tablet Take 1 tablet (40 mg total) by mouth daily. (Patient not taking: Reported on 04/15/2023) 90 tablet 1   No current facility-administered medications on file prior to visit.    Allergies  Allergen Reactions   Asa [Aspirin] Palpitations    Speeds up heart rate   Sulfamethoxazole-Trimethoprim Itching    Bactrim*      Lab Results HIV 1 RNA Quant (Copies/mL)  Date Value  04/17/2022 <20 (H)  04/16/2021 Not Detected  04/17/2020 <20   CD4 T Cell Abs (/uL)  Date Value  04/17/2022 703  04/16/2021 644  04/17/2020 579   No results found for: "HIV1GENOSEQ" Lab Results  Component Value Date   WBC 6.9 04/07/2023   HGB 12.5 (L) 04/07/2023   HCT 38.1 (L) 04/07/2023   MCV 96.4 04/07/2023   PLT 253.0 04/07/2023    Lab Results  Component Value Date   CREATININE 1.10 04/07/2023   BUN 11 04/07/2023   NA 139 04/07/2023   K 4.1 04/07/2023   CL 107 04/07/2023   CO2 25 04/07/2023   Lab Results  Component Value Date   ALT 13 04/07/2023   AST 15 04/07/2023   ALKPHOS 68 04/07/2023   BILITOT 0.3 04/07/2023    Lab Results  Component Value Date   CHOL 145 04/07/2023   TRIG 171.0 (H) 04/07/2023   HDL 38.50 (L)  04/07/2023   LDLCALC 73 04/07/2023   No results found for: "HAV" Lab Results  Component Value Date   HEPBSAG No 10/12/2006   HEPBSAB No 10/12/2006   Lab Results  Component Value Date   HCVAB No 10/12/2006   No results found for: "CHLAMYDIAWP", "N" No results found for: "GCPROBEAPT" No results found for: "QUANTGOLD"  Assessment/Plan #HIV8 -CD4 703, VL ND, on 03/31/23 -ON Biktarvy and prezcobix -F.U in 6 months, labs today   #tobacco abuse #Cough -Cough improved but not resolved with Zpack. Cough started in April, productive at times/ [facility] get CXR -No plans for smoking cessation  #Weight loss -Pt sates he is giaing weight now   #Vaccination COVID 05/28/22 Flu-needs Monkeypox PCV(needs 20) Meningitis HepAtoday 04/15/23 setorology HEpB today 8/28/24serology Tdap 2017 Shingles  #Health maintenance -Quantiferon today 04/15/23 -RPR today 04/15/23 -HCV today 04/15/23 -GC today 04/15/23 -Lipid panel on 04/07/23 on Risuvastatin -Colonoscopy(last year()    Danelle Earthly, MD [facility]   I have personally spent 45 minutes involved in face-to-face and non-face-to-face activities for this patient on the day of the visit. Professional time spent includes the following activities: Preparing to see the patient (review of tests), Obtaining and/or reviewing separately obtained history (admission/discharge record), Performing a medically appropriate examination and/or evaluation , Ordering medications/tests/procedures, referring and communicating with other health care professionals, Documenting clinical information in the EMR, Independently interpreting results (not separately reported), Communicating results to the patient/family/caregiver, Counseling and educating the patient/family/caregiver and Care coordination (not separately reported).

## 2023-04-17 LAB — LIPID PANEL
Cholesterol: 141 mg/dL (ref <200)
HDL: 42 mg/dL (ref >=40)
LDL Cholesterol (Calc): 80 mg/dL
Non-HDL Cholesterol (Calc): 99 mg/dL (ref <130)
Total CHOL/HDL Ratio: 3.4 (calc) (ref <5.0)
Triglycerides: 109 mg/dL (ref <150)

## 2023-04-17 LAB — QUANTIFERON-TB GOLD PLUS
Mitogen-NIL: >10 [IU]/mL
NIL: 0.05 IU/mL
QuantiFERON-TB Gold Plus: NEGATIVE
TB1-NIL: <0 [IU]/mL
TB2-NIL: 0.02 [IU]/mL

## 2023-04-17 LAB — CBC WITH DIFFERENTIAL/PLATELET
Absolute Monocytes: 826 {cells}/uL (ref 200–950)
Basophils Absolute: 30 {cells}/uL (ref 0–200)
Basophils Relative: 0.5 %
Eosinophils Absolute: 89 {cells}/uL (ref 15–500)
Eosinophils Relative: 1.5 %
HCT: 38.2 % — ABNORMAL LOW (ref 38.5–50.0)
Hemoglobin: 12.9 g/dL — ABNORMAL LOW (ref 13.2–17.1)
Lymphs Abs: 3015 {cells}/uL (ref 850–3900)
MCH: 31.8 pg (ref 27.0–33.0)
MCHC: 33.8 g/dL (ref 32.0–36.0)
MCV: 94.1 fL (ref 80.0–100.0)
MPV: 9.5 fL (ref 7.5–12.5)
Monocytes Relative: 14 %
Neutro Abs: 1941 {cells}/uL (ref 1500–7800)
Neutrophils Relative %: 32.9 %
Platelets: 232 10*3/uL (ref 140–400)
RBC: 4.06 10*6/uL — ABNORMAL LOW (ref 4.20–5.80)
RDW: 13.7 % (ref 11.0–15.0)
Total Lymphocyte: 51.1 %
WBC: 5.9 10*3/uL (ref 3.8–10.8)

## 2023-04-17 LAB — COMPLETE METABOLIC PANEL WITH GFR
AG Ratio: 1.5 (calc) (ref 1.0–2.5)
ALT: 11 U/L (ref 9–46)
AST: 13 U/L (ref 10–35)
Albumin: 4.6 g/dL (ref 3.6–5.1)
Alkaline phosphatase (APISO): 74 U/L (ref 35–144)
BUN: 15 mg/dL (ref 7–25)
CO2: 24 mmol/L (ref 20–32)
Calcium: 9.9 mg/dL (ref 8.6–10.3)
Chloride: 108 mmol/L (ref 98–110)
Creat: 1.23 mg/dL (ref 0.70–1.35)
Globulin: 3 g/dL (ref 1.9–3.7)
Glucose, Bld: 86 mg/dL (ref 65–99)
Potassium: 4.6 mmol/L (ref 3.5–5.3)
Sodium: 141 mmol/L (ref 135–146)
Total Bilirubin: 0.2 mg/dL (ref 0.2–1.2)
Total Protein: 7.6 g/dL (ref 6.1–8.1)
eGFR: 66 mL/min/{1.73_m2} (ref >=60)

## 2023-04-17 LAB — HIV-1 RNA QUANT-NO REFLEX-BLD
HIV 1 RNA Quant: NOT DETECTED {copies}/mL
HIV-1 RNA Quant, Log: NOT DETECTED {Log_copies}/mL

## 2023-04-17 LAB — HEPATITIS A ANTIBODY, TOTAL: Hepatitis A AB,Total: NONREACTIVE

## 2023-04-17 LAB — HEPATITIS B SURFACE ANTIBODY,QUALITATIVE: Hep B S Ab: NONREACTIVE

## 2023-04-17 LAB — HEPATITIS C ANTIBODY: Hepatitis C Ab: NONREACTIVE

## 2023-04-17 LAB — RPR: RPR Ser Ql: NONREACTIVE

## 2023-04-18 NOTE — Assessment & Plan Note (Signed)
Pt's PE unchanged from previous w/ exception of conjunctival injxn and productive cough.  UTD on Tdap.  Due for repeat colonoscopy- pt to call GI.  Check labs.  Anticipatory guidance provided.

## 2023-04-18 NOTE — Assessment & Plan Note (Signed)
Well controlled.  Currently asymptomatic.  Check labs.  No anticipated med changes.

## 2023-04-27 ENCOUNTER — Other Ambulatory Visit: Payer: Self-pay

## 2023-04-27 DIAGNOSIS — G4700 Insomnia, unspecified: Secondary | ICD-10-CM

## 2023-04-27 MED ORDER — TRAZODONE HCL 100 MG PO TABS: 100.0000 mg | ORAL_TABLET | Freq: Every day | ORAL | 1 refills | Status: DC

## 2023-05-06 ENCOUNTER — Other Ambulatory Visit (HOSPITAL_COMMUNITY): Payer: Self-pay

## 2023-05-07 ENCOUNTER — Other Ambulatory Visit (HOSPITAL_COMMUNITY): Payer: Self-pay

## 2023-05-20 ENCOUNTER — Other Ambulatory Visit: Payer: Self-pay

## 2023-05-20 MED ORDER — FAMOTIDINE 40 MG PO TABS: 40.0000 mg | ORAL_TABLET | Freq: Every day | ORAL | 1 refills | Status: DC

## 2023-05-20 NOTE — Telephone Encounter (Signed)
Received a fax from patient's pharmacy  Medication: Famotidine 40 mg  Directions: Take 1 tablet by mouth daily  Last given: 11/19/22 Number refills: 1 Last o/v: 04/07/23 Follow up: 10/08/23 Labs: 04/15/23

## 2023-05-30 ENCOUNTER — Ambulatory Visit (HOSPITAL_COMMUNITY)
Admission: EM | Admit: 2023-05-30 | Discharge: 2023-05-30 | Disposition: A | Discharge status: Home / Self Care | Payer: Medicare Other | Attending: Family Medicine | Admitting: Family Medicine

## 2023-05-30 ENCOUNTER — Encounter (HOSPITAL_COMMUNITY): Payer: Self-pay | Admitting: Emergency Medicine

## 2023-05-30 DIAGNOSIS — R42 Dizziness and giddiness: Secondary | ICD-10-CM | POA: Diagnosis not present

## 2023-05-30 LAB — COMPREHENSIVE METABOLIC PANEL
ALT: 15 U/L (ref 0–44)
AST: 20 U/L (ref 15–41)
Albumin: 4 g/dL (ref 3.5–5.0)
Alkaline Phosphatase: 65 U/L (ref 38–126)
Anion gap: 10 (ref 5–15)
BUN: 11 mg/dL (ref 8–23)
CO2: 21 mmol/L — ABNORMAL LOW (ref 22–32)
Calcium: 9.5 mg/dL (ref 8.9–10.3)
Chloride: 108 mmol/L (ref 98–111)
Creatinine, Ser: 1.36 mg/dL — ABNORMAL HIGH (ref 0.61–1.24)
GFR, Estimated: 58 mL/min — ABNORMAL LOW (ref >60)
Glucose, Bld: 79 mg/dL (ref 70–99)
Potassium: 4.1 mmol/L (ref 3.5–5.1)
Sodium: 139 mmol/L (ref 135–145)
Total Bilirubin: 0.2 mg/dL — ABNORMAL LOW (ref 0.3–1.2)
Total Protein: 7.5 g/dL (ref 6.5–8.1)

## 2023-05-30 LAB — CBC WITH DIFFERENTIAL/PLATELET
Abs Immature Granulocytes: 0.02 10*3/uL (ref 0.00–0.07)
Basophils Absolute: 0 10*3/uL (ref 0.0–0.1)
Basophils Relative: 0 %
Eosinophils Absolute: 0.1 10*3/uL (ref 0.0–0.5)
Eosinophils Relative: 2 %
HCT: 37 % — ABNORMAL LOW (ref 39.0–52.0)
Hemoglobin: 12.6 g/dL — ABNORMAL LOW (ref 13.0–17.0)
Immature Granulocytes: 0 %
Lymphocytes Relative: 43 %
Lymphs Abs: 2.3 10*3/uL (ref 0.7–4.0)
MCH: 32.3 pg (ref 26.0–34.0)
MCHC: 34.1 g/dL (ref 30.0–36.0)
MCV: 94.9 fL (ref 80.0–100.0)
Monocytes Absolute: 0.5 10*3/uL (ref 0.1–1.0)
Monocytes Relative: 10 %
Neutro Abs: 2.4 10*3/uL (ref 1.7–7.7)
Neutrophils Relative %: 45 %
Platelets: 219 10*3/uL (ref 150–400)
RBC: 3.9 MIL/uL — ABNORMAL LOW (ref 4.22–5.81)
RDW: 14.6 % (ref 11.5–15.5)
Smear Review: NORMAL
WBC: 5.4 10*3/uL (ref 4.0–10.5)
nRBC: 0 % (ref 0.0–0.2)

## 2023-05-30 NOTE — ED Provider Notes (Signed)
[facility]    CSN: [id number] Arrival date & time: 05/30/23  1412      History   Chief Complaint Chief Complaint  Patient presents with   Dizziness    HPI DAMARIA STOFKO is a 67 y.o. male.    Dizziness  Here for intermittent dizziness that he describes as a lightheadedness.  He has not had any vertigo.  For over a week he has had intermittent lightheaded spells.  Some of them are very brief and lasting about 10 seconds.  Today he had a spell that lasted about 3 hours.  It has resolved since he checked in to be seen here.  He is not having any headache or fever or cough or congestion.  No dysuria or nausea or vomiting or diarrhea.  Appetite has been good.  No chest pain or associated palpitations and no shortness of breath.  He does have a history of a stroke many years ago.  He reports some right hand numbness that is also intermittent but not necessarily associated with this lightheadedness. Past medical history significant for hypertension and HIV.   Past Medical History:  Diagnosis Date   Anal fistula    Aortic atherosclerosis (HCC)    Diverticulosis    GERD (gastroesophageal reflux disease)    Hiatal hernia    HIV infection (HCC)    Hypertension    Malignant neoplasm of prostate (HCC) 06/19/2020   Pancreatic cyst    Pneumonia yrs ago   "walking pneumonia"   Prostate cancer (HCC)    Stroke (HCC) yrs ago   no current neurlogist   Tubular adenoma of colon    Wears contact lenses    Wears dentures    upper full lower partial    Patient Active Problem List   Diagnosis Date Noted   Insomnia 10/18/2020   Malignant neoplasm of prostate (HCC) 06/19/2020   Depression 04/17/2020   Chronic diarrhea 09/07/2019   Ruptured epithelial cyst 06/08/2019   Impacted cerumen of left ear 05/17/2019   B12 deficiency 10/23/2017   Unintentional weight loss 12/15/2016   Protein-calorie malnutrition (HCC) 08/25/2015   Normocytic anemia 08/24/2015   Cerebral  aneurysm, nonruptured 09/02/2013   History of CVA (cerebrovascular accident) 06/22/2013   Routine general medical examination at a health care facility 01/07/2013   Essential hypertension 05/01/2010   CARPAL TUNNEL SYNDROME 01/02/2010   Human immunodeficiency virus (HIV) disease (HCC) 09/17/2006   GENITAL HERPES 09/17/2006   ERECTILE DYSFUNCTION 09/17/2006   ABUSE, ALCOHOL, 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 seton placement 10 years ago      BIOPSY  06/02/2022   Procedure: BIOPSY;  Surgeon: Lemar Lofty., MD;  Location: [facility];  Service: Gastroenterology;;   COLONOSCOPY     cyst in back     removed from back   ENDOSCOPIC MUCOSAL RESECTION N/A 07/31/2022   Procedure: ENDOSCOPIC MUCOSAL RESECTION;  Surgeon: Lemar Lofty., MD;  Location: [facility];  Service: Gastroenterology;  Laterality: N/A;   ESOPHAGOGASTRODUODENOSCOPY N/A 06/02/2022   Procedure: ESOPHAGOGASTRODUODENOSCOPY (EGD);  Surgeon: Lemar Lofty., MD;  Location: [facility];  Service: Gastroenterology;  Laterality: N/A;   ESOPHAGOGASTRODUODENOSCOPY (EGD) WITH PROPOFOL N/A 07/31/2022   Procedure: ESOPHAGOGASTRODUODENOSCOPY (EGD) WITH PROPOFOL;  Surgeon: Meridee Score Netty Starring., MD;  Location: [facility];  Service: Gastroenterology;  Laterality: N/A;   EUS N/A 06/02/2022   Procedure: UPPER ENDOSCOPIC ULTRASOUND (  EUS) RADIAL;  Surgeon: Meridee Score Netty Starring., MD;  Location: [facility];  Service: Gastroenterology;  Laterality: N/A;   FINE NEEDLE ASPIRATION N/A 06/02/2022   Procedure: FINE NEEDLE ASPIRATION (FNA) LINEAR;  Surgeon: Lemar Lofty., MD;  Location: [facility];  Service: Gastroenterology;  Laterality: N/A;   HEMOSTASIS CLIP PLACEMENT  07/31/2022   Procedure: HEMOSTASIS CLIP PLACEMENT;  Surgeon: Lemar Lofty., MD;  Location: [facility];   Service: Gastroenterology;;   PROSTATE BIOPSY  2021   RADIOACTIVE SEED IMPLANT N/A 09/10/2020   Procedure: RADIOACTIVE SEED IMPLANT/BRACHYTHERAPY IMPLANT;  Surgeon: Crista Elliot, MD;  Location: [facility];  Service: Urology;  Laterality: N/A;   SPACE OAR INSTILLATION N/A 09/10/2020   Procedure: SPACE OAR INSTILLATION;  Surgeon: Crista Elliot, MD;  [facility];  Service: Urology;  Laterality: N/A;   SUBMUCOSAL LIFTING INJECTION  07/31/2022   Procedure: SUBMUCOSAL LIFTING INJECTION;  Surgeon: Meridee Score Netty Starring., MD;  Location: [facility];  Service: Gastroenterology;;   SUBMUCOSAL TATTOO INJECTION  07/31/2022   Procedure: SUBMUCOSAL TATTOO INJECTION;  Surgeon: Lemar Lofty., MD;  Location: [facility];  Service: Gastroenterology;;   TEE WITHOUT CARDIOVERSION N/A 06/24/2013   Procedure: TRANSESOPHAGEAL ECHOCARDIOGRAM (TEE);  Surgeon: Dolores Patty, MD;  Location: [facility];  Service: Cardiovascular;  Laterality: N/A;       Home Medications    Prior to Admission medications   Medication Sig Start Date End Date Taking? Authorizing Provider  acetaminophen (TYLENOL) 650 MG CR tablet Take 1,300 mg by mouth in the morning and at bedtime.    [provider]  amLODipine (NORVASC) 10 MG tablet Take 1 tablet (10 mg total) by mouth daily. 12/22/22   Sheliah Hatch, MD  bictegravir-emtricitabine-tenofovir AF (BIKTARVY) 50-200-25 MG TABS tablet Take 1 tablet by mouth daily. 04/17/22   Cliffton Asters, MD  bictegravir-emtricitabine-tenofovir AF (BIKTARVY) 50-200-25 MG TABS tablet Take 1 tablet by mouth daily 04/15/23   Danelle Earthly, MD  cetirizine (ZYRTEC) 10 MG tablet Take 1 tablet (10 mg total) by mouth daily. 12/03/22   Sheliah Hatch, MD  clopidogrel (PLAVIX) 75 MG tablet Take 1 tablet (75 mg total) by mouth daily. 10/07/22   Sheliah Hatch, MD  darunavir-cobicistat ([name]) 800-150 MG tablet Take 1  tablet by mouth daily with food. do not  crush, break or chew tablets 04/15/23   Danelle Earthly, MD  famotidine (PEPCID) 40 MG tablet Take 1 tablet (40 mg total) by mouth daily. 05/20/23   Sheliah Hatch, MD  metoprolol succinate (TOPROL-XL) 50 MG 24 hr tablet Take 1 tablet (50 mg total) by mouth daily. Take with or immediately following a meal. 12/22/22   Sheliah Hatch, MD  Misc. Devices (PILL SPLITTER) MISC Please use to cut trazodone in 1/2. 01/27/18   Sheliah Hatch, MD  Multiple Vitamins-Minerals (MULTIVITAMIN MEN) TABS Take 1 tablet by mouth daily.    [provider]  neomycin-polymyxin-hydrocortisone (CORTISPORIN) 3.5-10000-1 OTIC suspension Place 4 drops into the left ear 3 (three) times daily. Do not use longer than 7 days 11/20/22   Sheliah Hatch, MD  olmesartan (BENICAR) 40 MG tablet Take 1 tablet (40 mg total) by mouth daily. 03/31/23   Sheliah Hatch, MD  Olopatadine HCl 0.2 % SOLN Apply 1 drop to eye daily. 12/03/22   Sheliah Hatch, MD  omeprazole (PRILOSEC OTC) 20 MG tablet Take 2 tablets (40 mg total) by mouth 2 (two) times daily before a meal. 07/31/22  Mansouraty, Netty Starring., MD  omeprazole (PRILOSEC) 20 MG capsule TAKE 1 CAPSULE BY MOUTH TWICE A DAY BEFORE A MEAL 12/01/22   Mansouraty, Netty Starring., MD  Propylene Glycol (SYSTANE BALANCE) 0.6 % SOLN Place 1 drop into both eyes as needed (dry eyes).    [provider]  rosuvastatin (CRESTOR) 10 MG tablet Take 1 tablet (10 mg total) by mouth daily. 01/13/23   Sheliah Hatch, MD  sildenafil (VIAGRA) 100 MG tablet Take 1 tablet (100 mg total) by mouth daily as needed for erectile dysfunction. 11/17/17   Cliffton Asters, MD  tamsulosin (FLOMAX) 0.4 MG CAPS capsule Take 0.4 mg by mouth in the morning and at bedtime. 12/24/21   [provider]  traZODone (DESYREL) 100 MG tablet Take 1 tablet (100 mg total) by mouth at bedtime. 04/27/23   Sheliah Hatch, MD  vitamin B-12 (CYANOCOBALAMIN)  1000 MCG tablet Take 1 tablet (1,000 mcg total) by mouth daily. 12/16/21   Meredith Pel, NP    Family History Family History  Problem Relation Age of Onset   Arthritis Mother    Heart disease Mother    Hypertension Mother    Diabetes Mother    Arthritis Father    Heart disease Father    Hypertension Father    Cancer Father    Lung cancer Brother    Prostate cancer Brother    Prostate cancer Brother    Colon cancer Neg Hx    Pancreatic cancer Neg Hx    Breast cancer Neg Hx    Esophageal cancer Neg Hx    Rectal cancer Neg Hx    Ovarian cancer Neg Hx    Stomach cancer Neg Hx     Social History Social History   Tobacco Use   Smoking status: Every Day    Current packs/day: 0.50    Average packs/day: 0.5 packs/day for 35.0 years (17.5 ttl pk-yrs)    Types: Cigarettes   Smokeless tobacco: Never   Tobacco comments:    slowing down  Vaping Use   Vaping status: Never Used  Substance Use Topics   Alcohol use: Not Currently   Drug use: Yes    Frequency: 7.0 times per week    Types: Marijuana    Comment: weekly     Allergies   Asa [aspirin] and Sulfamethoxazole-trimethoprim   Review of Systems Review of Systems  Neurological:  Positive for dizziness.     Physical Exam Triage Vital Signs ED Triage Vitals  Encounter Vitals Group     BP 05/30/23 1526 114/65     Systolic BP Percentile --      Diastolic BP Percentile --      Pulse Rate 05/30/23 1526 (!) 52     Resp 05/30/23 1526 16     Temp 05/30/23 1526 97.8 F (36.6 C)     Temp Source 05/30/23 1526 Oral     SpO2 05/30/23 1526 96 %     Weight --      Height --      Head Circumference --      Peak Flow --      Pain Score 05/30/23 1527 0     Pain Loc --      Pain Education --      Exclude from Growth Chart --    No data found.  Updated Vital Signs BP 114/65 (BP Location: Right Arm)   Pulse (!) 52   Temp 97.8 F (36.6 C) (Oral)   Resp  16   SpO2 96%   Visual Acuity Right Eye Distance:   Left  Eye Distance:   Bilateral Distance:    Right Eye Near:   Left Eye Near:    Bilateral Near:     Physical Exam Vitals reviewed.  Constitutional:      General: He is not in acute distress.    Appearance: He is not toxic-appearing.  HENT:     Nose: Nose normal.     Mouth/Throat:     Mouth: Mucous membranes are moist.     Pharynx: No oropharyngeal exudate or posterior oropharyngeal erythema.  Eyes:     Extraocular Movements: Extraocular movements intact.     Conjunctiva/sclera: Conjunctivae normal.     Pupils: Pupils are equal, round, and reactive to light.  Cardiovascular:     Rate and Rhythm: Normal rate and regular rhythm.     Heart sounds: No murmur heard. Pulmonary:     Effort: Pulmonary effort is normal. No respiratory distress.     Breath sounds: Normal breath sounds. No stridor. No wheezing, rhonchi or rales.  Abdominal:     Palpations: Abdomen is soft.     Tenderness: There is no abdominal tenderness.  Musculoskeletal:     Cervical back: Neck supple.  Lymphadenopathy:     Cervical: No cervical adenopathy.  Skin:    Capillary Refill: Capillary refill takes less than 2 seconds.     Coloration: Skin is not jaundiced or pale.  Neurological:     General: No focal deficit present.     Mental Status: He is alert and oriented to person, place, and time.  Psychiatric:        Behavior: Behavior normal.      UC Treatments / Results  Labs (all labs ordered are listed, but only abnormal results are displayed) Labs Reviewed  CBC WITH DIFFERENTIAL/PLATELET  COMPREHENSIVE METABOLIC PANEL    EKG   Radiology No results found.  Procedures Procedures (including critical care time)  Medications Ordered in UC Medications - No data to display  Initial Impression / Assessment and Plan / UC Course  I have reviewed the triage vital signs and the nursing notes.  Pertinent labs & imaging results that were available during my care of the patient were reviewed by me and  considered in my medical decision making (see chart for details).       CBC and CMP are drawn today.  We will notify him if anything is significantly abnormal.  He will follow-up with his primary care about this issue and he will go to the emergency room for further evaluation if he worsens in any way or if symptoms become more persistent. Final Clinical Impressions(s) / UC Diagnoses   Final diagnoses:  Dizziness     Discharge Instructions      We have drawn blood to check your blood counts, your sodium and potassium, sugar, and liver and kidney function numbers.  If anything is significantly abnormal our staff will call you.  Please follow-up with your primary care  If you become worse in any way, please consider going to emergency room for further evaluation     ED Prescriptions   None    PDMP not reviewed this encounter.   Zenia Resides, MD 05/30/23 [phone]

## 2023-05-30 NOTE — ED Triage Notes (Signed)
Pt c/o intermittent dizziness that has been intermittent over the past week. Reports today has lasted for 3 hours. Reports when he has the dizziness notices will get numb as well.

## 2023-05-30 NOTE — Discharge Instructions (Signed)
We have drawn blood to check your blood counts, your sodium and potassium, sugar, and liver and kidney function numbers.  If anything is significantly abnormal our staff will call you.  Please follow-up with your primary care  If you become worse in any way, please consider going to emergency room for further evaluation

## 2023-06-03 ENCOUNTER — Other Ambulatory Visit: Payer: Self-pay | Admitting: Gastroenterology

## 2023-06-04 ENCOUNTER — Other Ambulatory Visit (HOSPITAL_COMMUNITY): Payer: Self-pay

## 2023-06-04 ENCOUNTER — Other Ambulatory Visit (HOSPITAL_COMMUNITY): Payer: Self-pay | Admitting: Pharmacy Technician

## 2023-06-04 NOTE — Progress Notes (Signed)
Specialty Pharmacy Refill Coordination Note  JULIE PAOLINI is a 67 y.o. male contacted today regarding refills of specialty medication(s) Bictegravir-Emtricitab-Tenofov; Darunavir-Cobicistat   Patient requested Delivery   Delivery date: 06/09/23   Verified address: [address]  [place]   Medication will be filled on 06/08/23.

## 2023-06-08 ENCOUNTER — Other Ambulatory Visit: Payer: Self-pay

## 2023-06-08 ENCOUNTER — Other Ambulatory Visit (HOSPITAL_COMMUNITY): Payer: Self-pay

## 2023-06-08 DIAGNOSIS — B20 Human immunodeficiency virus [HIV] disease: Secondary | ICD-10-CM

## 2023-06-08 MED ORDER — BIKTARVY 50-200-25 MG PO TABS
ORAL_TABLET | ORAL | 5 refills | Status: DC
Start: 2023-06-08 — End: 2023-11-17

## 2023-06-08 MED ORDER — PREZCOBIX 800-150 MG PO TABS: ORAL_TABLET | ORAL | 5 refills | Status: DC

## 2023-06-24 ENCOUNTER — Other Ambulatory Visit: Payer: Self-pay

## 2023-06-24 DIAGNOSIS — I10 Essential (primary) hypertension: Secondary | ICD-10-CM

## 2023-06-24 MED ORDER — AMLODIPINE BESYLATE 10 MG PO TABS: 10.0000 mg | ORAL_TABLET | Freq: Every day | ORAL | 1 refills | Status: DC

## 2023-07-01 DIAGNOSIS — R350 Frequency of micturition: Secondary | ICD-10-CM | POA: Diagnosis not present

## 2023-07-01 DIAGNOSIS — R3912 Poor urinary stream: Secondary | ICD-10-CM | POA: Diagnosis not present

## 2023-07-27 ENCOUNTER — Encounter: Payer: Self-pay | Admitting: Gastroenterology

## 2023-08-18 ENCOUNTER — Other Ambulatory Visit: Payer: Self-pay

## 2023-08-18 DIAGNOSIS — E785 Hyperlipidemia, unspecified: Secondary | ICD-10-CM

## 2023-08-18 MED ORDER — ROSUVASTATIN CALCIUM 10 MG PO TABS: 10.0000 mg | ORAL_TABLET | Freq: Every day | ORAL | 6 refills | Status: DC

## 2023-08-29 ENCOUNTER — Other Ambulatory Visit: Payer: Self-pay | Admitting: Gastroenterology

## 2023-09-15 ENCOUNTER — Other Ambulatory Visit: Payer: Self-pay

## 2023-09-15 DIAGNOSIS — E785 Hyperlipidemia, unspecified: Secondary | ICD-10-CM

## 2023-09-15 MED ORDER — ROSUVASTATIN CALCIUM 10 MG PO TABS: 10.0000 mg | ORAL_TABLET | Freq: Every day | ORAL | 6 refills | Status: DC

## 2023-09-18 DIAGNOSIS — H40013 Open angle with borderline findings, low risk, bilateral: Secondary | ICD-10-CM | POA: Diagnosis not present

## 2023-09-29 ENCOUNTER — Other Ambulatory Visit: Payer: Self-pay

## 2023-09-29 DIAGNOSIS — Z8673 Personal history of transient ischemic attack (TIA), and cerebral infarction without residual deficits: Secondary | ICD-10-CM

## 2023-09-29 MED ORDER — CLOPIDOGREL BISULFATE 75 MG PO TABS: 75.0000 mg | ORAL_TABLET | Freq: Every day | ORAL | 3 refills | Status: AC

## 2023-10-01 ENCOUNTER — Telehealth: Payer: Self-pay | Admitting: Internal Medicine

## 2023-10-01 ENCOUNTER — Other Ambulatory Visit: Payer: Self-pay | Admitting: Family Medicine

## 2023-10-01 MED ORDER — OLMESARTAN MEDOXOMIL 40 MG PO TABS: 40.0000 mg | ORAL_TABLET | Freq: Every day | ORAL | 1 refills | Status: DC

## 2023-10-01 NOTE — Telephone Encounter (Signed)
Afternoon    We wanted to reach out to you in regards to your upcoming appointment on 10/14/2023 with Dr. Thedore Mins. Unfortunately, she will be out of the office on this day and we will need to get this rescheduled for you.    If you could give our office a call at [phone], select the option for scheduling, and we will be happy to help you!  Thanks   Claris Che

## 2023-10-01 NOTE — Telephone Encounter (Unsigned)
Copied from CRM [id number]. Topic: Clinical - Medication Refill >> Oct 01, 2023  1:58 PM Corin V wrote: Most Recent Primary Care Visit:  Provider: Sheliah Hatch  Department: [facility]  Visit Type: PHYSICAL  Date: 04/07/2023  Medication: olmesartan (BENICAR) 40 MG tablet  Has the patient contacted their pharmacy? Yes (Agent: If no, request that the patient contact the pharmacy for the refill. If patient does not wish to contact the pharmacy document the reason why and proceed with request.) (Agent: If yes, when and what did the pharmacy advise?)  Is this the correct pharmacy for this prescription? Yes If no, delete pharmacy and type the correct one.  This is the patient's preferred pharmacy:  [facility] [id number] - [place], [place] - [address] [place] [address] Phone: [phone] Fax: [phone]  [facility] - [facility] [address] [place] [address] Phone: [phone] Fax: [phone]  [facility] [phone] @ [address] [place], [place] - [address] [place] [address] Phone: [phone] Fax: [phone]   Has the prescription been filled recently? No  Is the patient out of the medication? Yes  Has the patient been seen for an appointment in the last year OR does the patient have an upcoming appointment? Yes  Can we respond through MyChart? No  Agent: Please be advised that Rx refills may take up to 3 business days. We ask that you follow-up with your pharmacy.

## 2023-10-08 ENCOUNTER — Ambulatory Visit: Payer: Commercial Managed Care - HMO | Admitting: Family Medicine

## 2023-10-08 ENCOUNTER — Other Ambulatory Visit: Payer: Self-pay | Admitting: Gastroenterology

## 2023-10-10 ENCOUNTER — Other Ambulatory Visit: Payer: Self-pay | Admitting: Gastroenterology

## 2023-10-14 ENCOUNTER — Encounter: Payer: Self-pay | Admitting: Family Medicine

## 2023-10-14 ENCOUNTER — Ambulatory Visit: Payer: Medicare Other | Admitting: Family Medicine

## 2023-10-14 VITALS — BP 100/50 | HR 60 | Temp 98.4°F | Ht 64.0 in | Wt 123.0 lb

## 2023-10-14 DIAGNOSIS — I10 Essential (primary) hypertension: Secondary | ICD-10-CM

## 2023-10-14 DIAGNOSIS — G4700 Insomnia, unspecified: Secondary | ICD-10-CM

## 2023-10-14 DIAGNOSIS — E785 Hyperlipidemia, unspecified: Secondary | ICD-10-CM | POA: Insufficient documentation

## 2023-10-14 MED ORDER — AMLODIPINE BESYLATE 5 MG PO TABS: 5.0000 mg | ORAL_TABLET | Freq: Every day | ORAL | 1 refills | Status: DC

## 2023-10-14 MED ORDER — TRAZODONE HCL 100 MG PO TABS
100.0000 mg | ORAL_TABLET | Freq: Every day | ORAL | 1 refills | Status: DC
Start: 2023-10-14 — End: 2024-04-14

## 2023-10-14 NOTE — Assessment & Plan Note (Signed)
 Ongoing issue.  Pt reports Trazodone is very effective and needs a refill

## 2023-10-14 NOTE — Patient Instructions (Addendum)
 Schedule your complete physical in 6 months We'll notify you of your lab results and make any changes if needed Decrease the Amlodipine to 5mg  daily- new prescription sent Call Dr Rhea Belton to schedule your colonoscopy at [phone] Call with any questions or concerns Stay Safe!  Stay Healthy! Happy Spring!

## 2023-10-14 NOTE — Assessment & Plan Note (Signed)
 Chronic problem.  On Crestor 10mg  daily w/o difficulty.  Check labs.  Adjust meds prn

## 2023-10-14 NOTE — Progress Notes (Signed)
   Subjective:    Patient ID: Daniel Gonzalez, male    DOB: May 05, 1956, 68 y.o.   MRN: [id number]  HPI HTN- chronic problem.  On Amlodipine 10mg  daily, Metoprolol XL 50mg  daily, Olmesartan 40mg  daily.  Pt reports he will get dizzy upon standing.  Denies CP, SOB above baseline, HA's, visual changes, edema.  Hyperlipidemia- chronic problem.  On Crestor 10mg  daily.  No abd pain, N/V.  Insomnia- ongoing issue.  Pt reports Trazodone is helping.  Would like refill.   Review of Systems For ROS see HPI     Objective:   Physical Exam Vitals reviewed.  Constitutional:      General: He is not in acute distress.    Appearance: He is well-developed.  HENT:     Head: Normocephalic and atraumatic.  Eyes:     Extraocular Movements: Extraocular movements intact.     Conjunctiva/sclera: Conjunctivae normal.     Pupils: Pupils are equal, round, and reactive to light.  Neck:     Thyroid: No thyromegaly.  Cardiovascular:     Rate and Rhythm: Normal rate and regular rhythm.     Pulses: Normal pulses.     Heart sounds: Normal heart sounds. No murmur heard. Pulmonary:     Effort: Pulmonary effort is normal. No respiratory distress.     Breath sounds: Normal breath sounds.  Abdominal:     General: Bowel sounds are normal. There is no distension.     Palpations: Abdomen is soft.  Musculoskeletal:     Cervical back: Normal range of motion and neck supple.     Right lower leg: No edema.     Left lower leg: No edema.  Lymphadenopathy:     Cervical: No cervical adenopathy.  Skin:    General: Skin is warm and dry.  Neurological:     General: No focal deficit present.     Mental Status: He is alert and oriented to person, place, and time.     Cranial Nerves: No cranial nerve deficit.  Psychiatric:        Mood and Affect: Mood normal.        Behavior: Behavior normal.           Assessment & Plan:

## 2023-10-14 NOTE — Assessment & Plan Note (Signed)
 Chronic problem.  BP is over controlled today.  Will decrease Amlodipine to 5mg  daily and continue Metoprolol XL 50mg  daily, Olmesartan 40mg  daily.  Currently asymptomatic.  Check labs due to ARB use.  Will follow.

## 2023-10-15 ENCOUNTER — Ambulatory Visit: Payer: Medicare Other | Admitting: Internal Medicine

## 2023-10-15 LAB — CBC WITH DIFFERENTIAL/PLATELET
Basophils Absolute: 0 10*3/uL (ref 0.0–0.1)
Basophils Relative: 0.4 % (ref 0.0–3.0)
Eosinophils Absolute: 0.1 10*3/uL (ref 0.0–0.7)
Eosinophils Relative: 1.4 % (ref 0.0–5.0)
HCT: 37.7 % — ABNORMAL LOW (ref 39.0–52.0)
Hemoglobin: 12.6 g/dL — ABNORMAL LOW (ref 13.0–17.0)
Lymphocytes Relative: 41.4 % (ref 12.0–46.0)
Lymphs Abs: 2.5 10*3/uL (ref 0.7–4.0)
MCHC: 33.5 g/dL (ref 30.0–36.0)
MCV: 96.7 fL (ref 78.0–100.0)
Monocytes Absolute: 0.7 10*3/uL (ref 0.1–1.0)
Monocytes Relative: 10.7 % (ref 3.0–12.0)
Neutro Abs: 2.8 10*3/uL (ref 1.4–7.7)
Neutrophils Relative %: 46.1 % (ref 43.0–77.0)
Platelets: 234 10*3/uL (ref 150.0–400.0)
RBC: 3.9 Mil/uL — ABNORMAL LOW (ref 4.22–5.81)
RDW: 14.8 % (ref 11.5–15.5)
WBC: 6.1 10*3/uL (ref 4.0–10.5)

## 2023-10-15 LAB — HEPATIC FUNCTION PANEL
ALT: 11 U/L (ref 0–53)
AST: 14 U/L (ref 0–37)
Albumin: 4.4 g/dL (ref 3.5–5.2)
Alkaline Phosphatase: 64 U/L (ref 39–117)
Bilirubin, Direct: 0.1 mg/dL (ref 0.0–0.3)
Total Bilirubin: 0.3 mg/dL (ref 0.2–1.2)
Total Protein: 7.7 g/dL (ref 6.0–8.3)

## 2023-10-15 LAB — BASIC METABOLIC PANEL
BUN: 17 mg/dL (ref 6–23)
CO2: 25 meq/L (ref 19–32)
Calcium: 9.5 mg/dL (ref 8.4–10.5)
Chloride: 108 meq/L (ref 96–112)
Creatinine, Ser: 1.22 mg/dL (ref 0.40–1.50)
GFR: 62.26 mL/min (ref >60.00)
Glucose, Bld: 102 mg/dL — ABNORMAL HIGH (ref 70–99)
Potassium: 4.4 meq/L (ref 3.5–5.1)
Sodium: 140 meq/L (ref 135–145)

## 2023-10-15 LAB — LIPID PANEL
Cholesterol: 130 mg/dL (ref 0–200)
HDL: 32.5 mg/dL — ABNORMAL LOW
LDL Cholesterol: 65 mg/dL (ref 0–99)
NonHDL: 97.98
Total CHOL/HDL Ratio: 4
Triglycerides: 167 mg/dL — ABNORMAL HIGH (ref 0.0–149.0)
VLDL: 33.4 mg/dL (ref 0.0–40.0)

## 2023-10-15 LAB — TSH: TSH: 1.5 u[IU]/mL (ref 0.35–5.50)

## 2023-10-16 ENCOUNTER — Ambulatory Visit: Payer: Self-pay | Admitting: Family Medicine

## 2023-10-16 ENCOUNTER — Telehealth: Payer: Self-pay

## 2023-10-16 ENCOUNTER — Encounter: Payer: Self-pay | Admitting: Family Medicine

## 2023-10-16 NOTE — Telephone Encounter (Signed)
 RN advised pt that per Dr. Beverely Low, labs are stable, look good, and there are no further changes at this time. RN advised pt that his amlodipine dose has been changed to 1 5mg  tablet daily which he can pick up at the pharmacy. Pt verbalized understanding.  Copied from CRM [id number]. Topic: Clinical - Lab/Test Results >> Oct 16, 2023 11:39 AM Alcus Dad wrote: Reason for CRM: Patient is requesting labs. CBC, BMP, and lipid panel are abnormal Reason for Disposition  Health Information question, no triage required and triager able to answer question  Answer Assessment - Initial Assessment Questions 1. REASON FOR CALL or QUESTION: "What is your reason for calling today?" or "How can I best help you?" or "What question do you have that I can help answer?"     Pt received voicemail from office about lab results. RN advised pt that per Dr. Beverely Low, labs are stable, look good, and there are no further changes at this time. RN advised pt that his amlodipine dose has been changed to 1 5mg  tablet daily which he can pick up at the pharmacy. Pt verbalized understanding.  Protocols used: Information Only Call - No Triage-A-AH

## 2023-10-16 NOTE — Telephone Encounter (Signed)
 Labs are stable and look good!  No changes at this time

## 2023-11-17 ENCOUNTER — Ambulatory Visit: Payer: Medicare Other | Admitting: Internal Medicine

## 2023-11-17 ENCOUNTER — Other Ambulatory Visit: Payer: Self-pay

## 2023-11-17 ENCOUNTER — Other Ambulatory Visit (HOSPITAL_COMMUNITY): Payer: Self-pay

## 2023-11-17 ENCOUNTER — Encounter: Payer: Self-pay | Admitting: Internal Medicine

## 2023-11-17 ENCOUNTER — Other Ambulatory Visit (HOSPITAL_COMMUNITY)
Admission: RE | Admit: 2023-11-17 | Discharge: 2023-11-17 | Disposition: A | Discharge status: Home / Self Care | Source: Ambulatory Visit | Attending: Internal Medicine | Admitting: Internal Medicine

## 2023-11-17 VITALS — BP 106/71 | HR 57 | Temp 97.9°F | Wt 123.0 lb

## 2023-11-17 DIAGNOSIS — B20 Human immunodeficiency virus [HIV] disease: Secondary | ICD-10-CM | POA: Insufficient documentation

## 2023-11-17 DIAGNOSIS — Z23 Encounter for immunization: Secondary | ICD-10-CM | POA: Diagnosis not present

## 2023-11-17 MED ORDER — PREZCOBIX 800-150 MG PO TABS
ORAL_TABLET | ORAL | 5 refills | Status: DC
  Filled 2023-11-17: qty 30, fill #0

## 2023-11-17 MED ORDER — BIKTARVY 50-200-25 MG PO TABS
1.0000 | ORAL_TABLET | Freq: Every day | ORAL | 11 refills | Status: DC
Start: 2023-11-17 — End: 2023-11-19
  Filled 2023-11-17: qty 30, 30d supply, fill #0

## 2023-11-17 NOTE — Progress Notes (Signed)
 [facility]     HPI: Daniel Gonzalez is a 68 y.o. male management for HIV. Date of diagnosis Early 107s, late 18s ART exposure  Kaletra, viread, combivir, juluca, [name] Past UEA:VWUJ that he knows off Risk factors: heterosexual Partners in last 2months 0, in the last 12 months0.  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 Family doesn't know about HIV Understanding of HIV: Etoh/drug/tobacco use: Sober 1 year ago/ MJ/yes  Past Medical History:  Diagnosis Date   Anal fistula    Aortic atherosclerosis (HCC)    Diverticulosis    GERD (gastroesophageal reflux disease)    Hiatal hernia    HIV infection (HCC)    Hypertension    Malignant neoplasm of prostate (HCC) 06/19/2020   Pancreatic cyst    Pneumonia yrs ago   "walking pneumonia"   Prostate cancer (HCC)    Stroke (HCC) yrs ago   no current neurlogist   Tubular adenoma of colon    Wears contact lenses    Wears dentures    upper full lower partial    Past Surgical History:  Procedure Laterality Date   ? seton placement 10 years ago      BIOPSY  06/02/2022   Procedure: BIOPSY;  Surgeon: Mansouraty, Netty Starring., MD;  Location: [facility];  Service: Gastroenterology;;   COLONOSCOPY     cyst in back     removed from back   ENDOSCOPIC MUCOSAL RESECTION N/A 07/31/2022   Procedure: ENDOSCOPIC MUCOSAL RESECTION;  Surgeon: Lemar Lofty., MD;  Location: [facility];  Service: Gastroenterology;  Laterality: N/A;   ESOPHAGOGASTRODUODENOSCOPY N/A 06/02/2022   Procedure: ESOPHAGOGASTRODUODENOSCOPY (EGD);  Surgeon: Lemar Lofty., MD;  Location: [facility];  Service: Gastroenterology;  Laterality: N/A;   ESOPHAGOGASTRODUODENOSCOPY (EGD) WITH PROPOFOL N/A 07/31/2022   Procedure: ESOPHAGOGASTRODUODENOSCOPY (EGD) WITH  PROPOFOL;  Surgeon: Meridee Score Netty Starring., MD;  Location: [facility];  Service: Gastroenterology;  Laterality: N/A;   EUS N/A 06/02/2022   Procedure: UPPER ENDOSCOPIC ULTRASOUND (EUS) RADIAL;  Surgeon: Lemar Lofty., MD;  Location: [facility];  Service: Gastroenterology;  Laterality: N/A;   FINE NEEDLE ASPIRATION N/A 06/02/2022   Procedure: FINE NEEDLE ASPIRATION (FNA) LINEAR;  Surgeon: Lemar Lofty., MD;  Location: [facility];  Service: Gastroenterology;  Laterality: N/A;   HEMOSTASIS CLIP PLACEMENT  07/31/2022   Procedure: HEMOSTASIS CLIP PLACEMENT;  Surgeon: Lemar Lofty., MD;  Location: [facility];  Service: Gastroenterology;;   PROSTATE BIOPSY  2021   RADIOACTIVE SEED IMPLANT N/A 09/10/2020   Procedure: RADIOACTIVE SEED IMPLANT/BRACHYTHERAPY IMPLANT;  Surgeon: Crista Elliot, MD;  Location: [facility];  Service: Urology;  Laterality: N/A;   SPACE OAR INSTILLATION N/A 09/10/2020   Procedure: SPACE OAR INSTILLATION;  Surgeon: Crista Elliot, MD;  Location: [facility];  Service: Urology;  Laterality: N/A;   SUBMUCOSAL LIFTING INJECTION  07/31/2022   Procedure: SUBMUCOSAL LIFTING INJECTION;  Surgeon: Meridee Score Netty Starring., MD;  Location: [facility];  Service: Gastroenterology;;   SUBMUCOSAL TATTOO INJECTION  07/31/2022   Procedure: SUBMUCOSAL TATTOO INJECTION;  Surgeon: Lemar Lofty., MD;  Location: [facility];  Service: Gastroenterology;;   TEE WITHOUT CARDIOVERSION N/A 06/24/2013   Procedure: TRANSESOPHAGEAL ECHOCARDIOGRAM (TEE);  Surgeon: Dolores Patty, MD;  Location: [facility];  Service: Cardiovascular;  Laterality: N/A;    Family History  Problem Relation Age of Onset   Arthritis Mother    Heart disease Mother    Hypertension Mother    Diabetes Mother    Arthritis Father    Heart disease Father    Hypertension Father    Cancer Father    Lung cancer Brother    Prostate cancer  Brother    Prostate cancer Brother    Colon cancer Neg Hx    Pancreatic cancer Neg Hx    Breast cancer Neg Hx    Esophageal cancer Neg Hx    Rectal cancer Neg Hx    Ovarian cancer Neg Hx    Stomach cancer Neg Hx    Current Outpatient Medications on File Prior to Visit  Medication Sig Dispense Refill   acetaminophen (TYLENOL) 650 MG CR tablet Take 1,300 mg by mouth in the morning and at bedtime.     alfuzosin (UROXATRAL) 10 MG 24 hr tablet      amLODipine (NORVASC) 5 MG tablet Take 1 tablet (5 mg total) by mouth daily. 90 tablet 1   AREXVY 120 MCG/0.5ML injection      bictegravir-emtricitabine-tenofovir AF (BIKTARVY) 50-200-25 MG TABS tablet Take 1 tablet by mouth daily. 30 tablet 11   bictegravir-emtricitabine-tenofovir AF (BIKTARVY) 50-200-25 MG TABS tablet Take 1 tablet by mouth daily 30 tablet 5   cetirizine (ZYRTEC) 10 MG tablet Take 1 tablet (10 mg total) by mouth daily. 30 tablet 11   clopidogrel (PLAVIX) 75 MG tablet Take 1 tablet (75 mg total) by mouth daily. 90 tablet 3   darunavir-cobicistat ([name]) 800-150 MG tablet Take 1 tablet by mouth daily with food. do not  crush, break or chew tablets 30 tablet 5   famotidine (PEPCID) 40 MG tablet Take 1 tablet (40 mg total) by mouth daily. 90 tablet 1   metoprolol succinate (TOPROL-XL) 50 MG 24 hr tablet Take 1 tablet (50 mg total) by mouth daily. Take with or immediately following a meal. 90 tablet 3   Misc. Devices (PILL SPLITTER) MISC Please use to cut trazodone in 1/2. 1 each 0   Multiple Vitamins-Minerals (MULTIVITAMIN MEN) TABS Take 1 tablet by mouth daily.     neomycin-polymyxin-hydrocortisone (CORTISPORIN) 3.5-10000-1 OTIC suspension Place 4 drops into the left ear 3 (three) times daily. Do not use longer than 7 days 10 mL 0   olmesartan (BENICAR) 40 MG tablet Take 1 tablet (40 mg total) by mouth daily. 90 tablet 1   Olopatadine HCl 0.2 % SOLN Apply 1 drop to eye daily. 2.5 mL 3   omeprazole (PRILOSEC) 20 MG capsule TAKE 1  CAPSULE BY MOUTH 2 TIMES A DAY BEFORE A MEAL; **MUST CALL MD FOR APPOINTMENT 60 capsule 0   Propylene Glycol (SYSTANE BALANCE) 0.6 % SOLN Place 1 drop into both eyes as needed (dry eyes).     rosuvastatin (CRESTOR) 10 MG tablet Take 1 tablet (10 mg total) by mouth daily. 30 tablet 6   sildenafil (VIAGRA) 100 MG tablet Take 1 tablet (100 mg total) by mouth daily as needed for erectile dysfunction. 90 tablet 0   SPIKEVAX syringe      tamsulosin (FLOMAX) 0.4 MG CAPS capsule Take 0.4 mg by mouth in the morning and at bedtime.     traZODone (DESYREL) 100 MG tablet Take 1 tablet (100 mg total) by mouth at bedtime. 90 tablet 1   vitamin B-12 (CYANOCOBALAMIN) 1000 MCG tablet Take 1 tablet (1,000 mcg total) by mouth daily.  No current facility-administered medications on file prior to visit.    Allergies  Allergen Reactions   Asa [Aspirin] Palpitations    Speeds up heart rate   Sulfamethoxazole-Trimethoprim Itching    Bactrim*      Lab Results HIV 1 RNA Quant (Copies/mL)  Date Value  04/15/2023 Not Detected  04/17/2022 <20 (H)  04/16/2021 Not Detected   CD4 T Cell Abs (/uL)  Date Value  04/17/2022 703  04/16/2021 644  04/17/2020 579   No results found for: "HIV1GENOSEQ" Lab Results  Component Value Date   WBC 6.1 10/14/2023   HGB 12.6 (L) 10/14/2023   HCT 37.7 (L) 10/14/2023   MCV 96.7 10/14/2023   PLT 234.0 10/14/2023    Lab Results  Component Value Date   CREATININE 1.22 10/14/2023   BUN 17 10/14/2023   NA 140 10/14/2023   K 4.4 10/14/2023   CL 108 10/14/2023   CO2 25 10/14/2023   Lab Results  Component Value Date   ALT 11 10/14/2023   AST 14 10/14/2023   ALKPHOS 64 10/14/2023   BILITOT 0.3 10/14/2023    Lab Results  Component Value Date   CHOL 130 10/14/2023   TRIG 167.0 (H) 10/14/2023   HDL 32.50 (L) 10/14/2023   LDLCALC 65 10/14/2023   Lab Results  Component Value Date   HAV NON-REACTIVE 04/15/2023   Lab Results  Component Value Date   HEPBSAG No  10/12/2006   HEPBSAB NON-REACTIVE 04/15/2023   Lab Results  Component Value Date   HCVAB No 10/12/2006   No results found for: "CHLAMYDIAWP", "N" No results found for: "GCPROBEAPT" No results found for: "QUANTGOLD"   Assessment and Plan #HIV8 -CD4 703, VL ND,  -ON Biktarvy and prezcobix -F.U in 6 months, labs today     #tobacco abuse #Cough -No plans for smoking cessation   #Weight loss -Pt sates he is giaing weight now     #Vaccination COVID 05/28/22 Flu-utd 03/20/27 Monkeypox PCV202/3/25 Meningitis HepAno t immune 04/15/23 setorology. Vaccine todya HEpB not immune 8/28/24serology. Sp hbv series. Will re-immunize. Vaccine todya Tdap 2017 Shingles   #Health maintenance -Quantiferon negative 04/15/23 -RPR negative 04/15/23 -HCV negative 04/15/23 -GC today  -Lipid panel on 04/07/23 on Risuvastatin -Colonoscopy- scheduled this year per patietn   Danelle Earthly, MD [facility] I have personally spent 42 minutes involved in face-to-face and non-face-to-face activities for this patient on the day of the visit. Professional time spent includes the following activities: Preparing to see the patient (review of tests), Obtaining and/or reviewing separately obtained history (admission/discharge record), Performing a medically appropriate examination and/or evaluation , Ordering medications/tests/procedures, referring and communicating with other health care professionals, Documenting clinical information in the EMR, Independently interpreting results (not separately reported), Communicating results to the patient/family/caregiver, Counseling and educating the patient/family/caregiver and Care coordination (not separately reported).

## 2023-11-18 ENCOUNTER — Other Ambulatory Visit: Payer: Self-pay

## 2023-11-18 LAB — T-HELPER CELLS (CD4) COUNT (NOT AT ARMC)
CD4 % Helper T Cell: 28 % — ABNORMAL LOW (ref 33–65)
CD4 T Cell Abs: 786 /uL (ref 400–1790)

## 2023-11-18 LAB — URINE CYTOLOGY ANCILLARY ONLY
Chlamydia: NEGATIVE
Comment: NEGATIVE
Comment: NORMAL
Neisseria Gonorrhea: NEGATIVE

## 2023-11-19 ENCOUNTER — Other Ambulatory Visit: Payer: Self-pay

## 2023-11-19 DIAGNOSIS — B20 Human immunodeficiency virus [HIV] disease: Secondary | ICD-10-CM

## 2023-11-19 LAB — COMPLETE METABOLIC PANEL WITHOUT GFR
AG Ratio: 1.6 (calc) (ref 1.0–2.5)
ALT: 10 U/L (ref 9–46)
AST: 13 U/L (ref 10–35)
Albumin: 4.6 g/dL (ref 3.6–5.1)
Alkaline phosphatase (APISO): 71 U/L (ref 35–144)
BUN: 15 mg/dL (ref 7–25)
CO2: 26 mmol/L (ref 20–32)
Calcium: 9.8 mg/dL (ref 8.6–10.3)
Chloride: 109 mmol/L (ref 98–110)
Creat: 1.15 mg/dL (ref 0.70–1.35)
Globulin: 2.8 g/dL (ref 1.9–3.7)
Glucose, Bld: 86 mg/dL (ref 65–99)
Potassium: 4.5 mmol/L (ref 3.5–5.3)
Sodium: 141 mmol/L (ref 135–146)
Total Bilirubin: 0.3 mg/dL (ref 0.2–1.2)
Total Protein: 7.4 g/dL (ref 6.1–8.1)

## 2023-11-19 LAB — CBC WITH DIFFERENTIAL/PLATELET
Absolute Lymphocytes: 3172 {cells}/uL (ref 850–3900)
Absolute Monocytes: 748 {cells}/uL (ref 200–950)
Basophils Absolute: 33 {cells}/uL (ref 0–200)
Basophils Relative: 0.5 %
Eosinophils Absolute: 78 {cells}/uL (ref 15–500)
Eosinophils Relative: 1.2 %
HCT: 39.3 % (ref 38.5–50.0)
Hemoglobin: 13.5 g/dL (ref 13.2–17.1)
MCH: 32 pg (ref 27.0–33.0)
MCHC: 34.4 g/dL (ref 32.0–36.0)
MCV: 93.1 fL (ref 80.0–100.0)
MPV: 9.7 fL (ref 7.5–12.5)
Monocytes Relative: 11.5 %
Neutro Abs: 2470 {cells}/uL (ref 1500–7800)
Neutrophils Relative %: 38 %
Platelets: 223 10*3/uL (ref 140–400)
RBC: 4.22 10*6/uL (ref 4.20–5.80)
RDW: 13.5 % (ref 11.0–15.0)
Total Lymphocyte: 48.8 %
WBC: 6.5 10*3/uL (ref 3.8–10.8)

## 2023-11-19 LAB — HIV-1 RNA QUANT-NO REFLEX-BLD
HIV 1 RNA Quant: NOT DETECTED {copies}/mL
HIV-1 RNA Quant, Log: NOT DETECTED {Log_copies}/mL

## 2023-11-19 MED ORDER — BIKTARVY 50-200-25 MG PO TABS: 1.0000 | ORAL_TABLET | Freq: Every day | ORAL | 5 refills | Status: DC

## 2023-11-19 MED ORDER — PREZCOBIX 800-150 MG PO TABS: ORAL_TABLET | ORAL | 5 refills | Status: DC

## 2023-11-19 NOTE — Telephone Encounter (Signed)
 Yes, alfuzosin is contraindicated with [name] due to potential for toxic levels. He last filled a 90-day supply in October from what I can see, so I'm not sure if he's still taking it or not. Would clarify and then have him discontinue and follow up with the provider prescribing alfuzosin.

## 2023-11-19 NOTE — Telephone Encounter (Signed)
 Spoke to patient informed him to stop taking Alfuzosin and contact Dr.Eguene Bell to see if there is another medication that he can take as Prescobix interacts with Alfuzosin. Patient aware and will contact urologist.    Refills for [facility] and Prescobix sent to [facility].

## 2023-11-26 NOTE — Progress Notes (Unsigned)
 Celso Amy, PA-C [address] [place], [place]  [address] Phone: [phone]   Primary Care Physician: Sheliah Hatch, MD  Primary Gastroenterologist:  Celso Amy, PA-C / Dr. Erick Blinks   Chief Complaint: History of multiple adenomatous colon polyps, repeat colonoscopy; history of duodenal adenoma, repeat EGD; history of pancreatic cyst.   HPI:   Daniel Gonzalez is a 68 y.o. male presents to discuss scheduling a repeat colonoscopy and EGD.  12/2021 first screening colonoscopy by Dr. Rhea Belton:  - Furuncle on the right upper buttocks with purulent drainage and palpable subcutaneous abscess found on perianal exam.  - Four 3 to 5 mm polyps in the cecum, removed with a cold snare. Resected and retrieved.  - One 4 mm (tubular adenoma) polyp in the ascending colon, removed with a cold snare. Resected and retrieved.  - Two 4 to 5 mm (tubular adenoma) polyps in the transverse colon, removed with a cold snare. Resected and retrieved.  - Three 4 to 7 mm (pedunculated and sessile tubular adenoma) polyps in the sigmoid colon, removed with a cold snare. Resected and retrieved.  - Moderate diverticulosis in the sigmoid colon and in the descending colon. Erythema was seen in association with the diverticular opening.  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  12/2021 EGD by Dr. Rhea Belton: Wide Schatzki's ring, 3 cm hiatal hernia, mild gastritis, moderate duodenitis. Biopsies negative for H. pylori and celiac.  05/2022 EGD/EUS by Dr. Meridee Score for pancreatic cyst: Duodenal Ampullary adenoma (low-grade dysplasia), negative for high-grade dysplasia.  Pancreas cyst cytology benign.  07/2022 EGD/EUS by Dr. Inda Merlin (for duodenal polyp): Widely patent Schatzki's ring, 2 cm hiatal hernia, mild gastritis, single duodenal polyp resected and clips placed, congested duodenal mucosa.  Treated with omeprazole 40 Mg twice daily and Carafate twice daily.  Recommended  repeat EGD in 1 year for surveillance (overdue).  07/2022 last abdominal CT with contrast (follow-up ampullary adenoma of the duodenum, pancreatic head cystic lesions with benign cytology):  No change in multiple pancreatic cystic lesions, mild duodenal thickening with known duodenal adenoma, mild prominent common bile duct 8 mm.  Medical history significant for HIV on antiretroviral's, hypertension, pancreatic cyst, prostate cancer (2021), CVA (years ago), anal fistula, GERD, hiatal hernia, and atherosclerosis.  Currently on Plavix prescribed by PCP Dr. Beverely Low.  11/17/2023 labs: Normal CBC and CMP.  Hemoglobin 13.5.  Current symptoms: Patient denies abdominal pain, heartburn, dysphagia, nausea, vomiting, melena, or hematochezia.  He admits to weight gain.  Weight is up from 110-125.  He stopped drinking all alcohol 2 years ago 04/2022.  He is feeling a lot better since he stopped drinking alcohol.  Currently has 3 or 4 loose stools every morning with occasional hard stool.  Occasional episode of fecal smearing when he coughs.  He does continue to smoke tobacco and marijuana.  Current Outpatient Medications  Medication Sig Dispense Refill   acetaminophen (TYLENOL) 650 MG CR tablet Take 1,300 mg by mouth in the morning and at bedtime.     amLODipine (NORVASC) 5 MG tablet Take 1 tablet (5 mg total) by mouth daily. 90 tablet 1   AREXVY 120 MCG/0.5ML injection      bictegravir-emtricitabine-tenofovir AF (BIKTARVY) 50-200-25 MG TABS tablet Take 1 tablet by mouth daily. 30 tablet 5   cetirizine (ZYRTEC) 10 MG tablet Take 1 tablet (10 mg total) by mouth daily. 30 tablet 11   clopidogrel (PLAVIX) 75 MG tablet Take 1 tablet (75  mg total) by mouth daily. 90 tablet 3   darunavir-cobicistat ([name]) 800-150 MG tablet Take 1 tablet by mouth daily with food. do not  crush, break or chew tablets 30 tablet 5   famotidine (PEPCID) 40 MG tablet Take 1 tablet (40 mg total) by mouth daily. 90 tablet 1   metoprolol  succinate (TOPROL-XL) 50 MG 24 hr tablet Take 1 tablet (50 mg total) by mouth daily. Take with or immediately following a meal. 90 tablet 3   Misc. Devices (PILL SPLITTER) MISC Please use to cut trazodone in 1/2. 1 each 0   Multiple Vitamins-Minerals (MULTIVITAMIN MEN) TABS Take 1 tablet by mouth daily.     olmesartan (BENICAR) 40 MG tablet Take 1 tablet (40 mg total) by mouth daily. 90 tablet 1   Propylene Glycol (SYSTANE BALANCE) 0.6 % SOLN Place 1 drop into both eyes as needed (dry eyes).     rosuvastatin (CRESTOR) 10 MG tablet Take 1 tablet (10 mg total) by mouth daily. 30 tablet 6   sildenafil (VIAGRA) 100 MG tablet Take 1 tablet (100 mg total) by mouth daily as needed for erectile dysfunction. 90 tablet 0   SPIKEVAX syringe      tamsulosin (FLOMAX) 0.4 MG CAPS capsule Take 0.4 mg by mouth in the morning and at bedtime.     traZODone (DESYREL) 100 MG tablet Take 1 tablet (100 mg total) by mouth at bedtime. 90 tablet 1   vitamin B-12 (CYANOCOBALAMIN) 1000 MCG tablet Take 1 tablet (1,000 mcg total) by mouth daily.     No current facility-administered medications for this visit.    Allergies as of 11/27/2023 - Review Complete 11/17/2023  Allergen Reaction Noted   Asa [aspirin] Palpitations 06/06/2013   Sulfamethoxazole-trimethoprim Itching 09/17/2012    Past Medical History:  Diagnosis Date   Anal fistula    Aortic atherosclerosis (HCC)    Diverticulosis    GERD (gastroesophageal reflux disease)    Hiatal hernia    HIV infection (HCC)    Hypertension    Malignant neoplasm of prostate (HCC) 06/19/2020   Pancreatic cyst    Pneumonia yrs ago   "walking pneumonia"   Prostate cancer (HCC)    Stroke (HCC) yrs ago   no current neurlogist   Tubular adenoma of colon    Wears contact lenses    Wears dentures    upper full lower partial    Past Surgical History:  Procedure Laterality Date   ? seton placement 10 years ago      BIOPSY  06/02/2022   Procedure: BIOPSY;  Surgeon:  Lemar Lofty., MD;  Location: [facility];  Service: Gastroenterology;;   COLONOSCOPY     cyst in back     removed from back   ENDOSCOPIC MUCOSAL RESECTION N/A 07/31/2022   Procedure: ENDOSCOPIC MUCOSAL RESECTION;  Surgeon: Lemar Lofty., MD;  Location: [facility];  Service: Gastroenterology;  Laterality: N/A;   ESOPHAGOGASTRODUODENOSCOPY N/A 06/02/2022   Procedure: ESOPHAGOGASTRODUODENOSCOPY (EGD);  Surgeon: Lemar Lofty., MD;  Location: [facility];  Service: Gastroenterology;  Laterality: N/A;   ESOPHAGOGASTRODUODENOSCOPY (EGD) WITH PROPOFOL N/A 07/31/2022   Procedure: ESOPHAGOGASTRODUODENOSCOPY (EGD) WITH PROPOFOL;  Surgeon: Meridee Score Netty Starring., MD;  Location: [facility];  Service: Gastroenterology;  Laterality: N/A;   EUS N/A 06/02/2022   Procedure: UPPER ENDOSCOPIC ULTRASOUND (EUS) RADIAL;  Surgeon: Lemar Lofty., MD;  Location: [facility];  Service: Gastroenterology;  Laterality: N/A;   FINE NEEDLE ASPIRATION N/A 06/02/2022   Procedure: FINE NEEDLE ASPIRATION (FNA) LINEAR;  Surgeon: Lemar Lofty., MD;  Location: [facility];  Service: Gastroenterology;  Laterality: N/A;   HEMOSTASIS CLIP PLACEMENT  07/31/2022   Procedure: HEMOSTASIS CLIP PLACEMENT;  Surgeon: Lemar Lofty., MD;  Location: [facility];  Service: Gastroenterology;;   PROSTATE BIOPSY  2021   RADIOACTIVE SEED IMPLANT N/A 09/10/2020   Procedure: RADIOACTIVE SEED IMPLANT/BRACHYTHERAPY IMPLANT;  Surgeon: Crista Elliot, MD;  Location: [facility];  Service: Urology;  Laterality: N/A;   SPACE OAR INSTILLATION N/A 09/10/2020   Procedure: SPACE OAR INSTILLATION;  Surgeon: Crista Elliot, MD;  Location[facility];  Service: Urology;  Laterality: N/A;   SUBMUCOSAL LIFTING INJECTION  07/31/2022   Procedure: SUBMUCOSAL LIFTING INJECTION;  Surgeon: Meridee Score Netty Starring., MD;  Location: [facility];  Service:  Gastroenterology;;   SUBMUCOSAL TATTOO INJECTION  07/31/2022   Procedure: SUBMUCOSAL TATTOO INJECTION;  Surgeon: Lemar Lofty., MD;  Location: [facility];  Service: Gastroenterology;;   TEE WITHOUT CARDIOVERSION N/A 06/24/2013   Procedure: TRANSESOPHAGEAL ECHOCARDIOGRAM (TEE);  Surgeon: Dolores Patty, MD;  Location: [facility];  Service: Cardiovascular;  Laterality: N/A;    Review of Systems:    All systems reviewed and negative except where noted in HPI.    Physical Exam:  BP 106/66   Pulse 75   Ht 5\' 4"  (1.626 m)   Wt 124 lb (56.2 kg)   BMI 21.28 kg/m  No LMP for male patient.  General: Well-nourished, well-developed in no acute distress.  Lungs: Clear to auscultation bilaterally. Non-labored. Heart: Regular rate and rhythm, no murmurs rubs or gallops.  Abdomen: Bowel sounds are normal; Abdomen is Soft; No hepatosplenomegaly, masses or hernias;  No Abdominal Tenderness; No guarding or rebound tenderness. Neuro: Alert and oriented x 3.  Grossly intact.  Psych: Alert and cooperative, normal mood and affect.   Imaging Studies: No results found.  Assessment and Plan:   Daniel Gonzalez is a 68 y.o. y/o male returns for follow-up.  Currently due for repeat EGD and colonoscopy.  He has no GI symptoms or concerns today.  Weight has improved.  He is feeling a lot better since he stopped drinking alcohol 2 years ago (04/2022).  1.  History of multiple adenomatous colon polyps  Scheduling Surveillance Colonoscopy I discussed risks of colonoscopy with patient to include risk of bleeding, colon perforation, and risk of sedation.  Patient expressed understanding and agrees to proceed with colonoscopy.  Okay  2.  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  Patient expressed understanding and agrees to proceed with EGD.   Hello  3.  History of pancreatic cysts; biopsy showed benign cytology (EUS 05/2022).  Order f/u Abdominal MRI (pancreas protocol) w / wo contrast.  4.  History of CVA and atherosclerosis currently on Plavix  Requesting permission to hold Plavix 5 days prior to procedures from PCP, Dr. Beverely Low.  5.  History of HIV on antiretrovirals  Stable.  6.  Tobacco and marijuana use  Encouraged smoking cessation.  Follow-up with PCP for resources.  Celso Amy, PA-C  Follow up will be based on above procedure results.

## 2023-11-27 ENCOUNTER — Encounter: Payer: Self-pay | Admitting: Physician Assistant

## 2023-11-27 ENCOUNTER — Ambulatory Visit: Admitting: Physician Assistant

## 2023-11-27 ENCOUNTER — Telehealth: Payer: Self-pay

## 2023-11-27 VITALS — BP 106/66 | HR 75 | Ht 64.0 in | Wt 124.0 lb

## 2023-11-27 DIAGNOSIS — Z860100 Personal history of colon polyps, unspecified: Secondary | ICD-10-CM

## 2023-11-27 DIAGNOSIS — K862 Cyst of pancreas: Secondary | ICD-10-CM

## 2023-11-27 DIAGNOSIS — Z860101 Personal history of adenomatous and serrated colon polyps: Secondary | ICD-10-CM

## 2023-11-27 DIAGNOSIS — Z8719 Personal history of other diseases of the digestive system: Secondary | ICD-10-CM

## 2023-11-27 DIAGNOSIS — D132 Benign neoplasm of duodenum: Secondary | ICD-10-CM

## 2023-11-27 DIAGNOSIS — F1290 Cannabis use, unspecified, uncomplicated: Secondary | ICD-10-CM

## 2023-11-27 DIAGNOSIS — Z720 Tobacco use: Secondary | ICD-10-CM | POA: Diagnosis not present

## 2023-11-27 MED ORDER — NA SULFATE-K SULFATE-MG SULF 17.5-3.13-1.6 GM/177ML PO SOLN: ORAL | 0 refills | Status: DC

## 2023-11-27 NOTE — Telephone Encounter (Signed)
 Informed patient he can hold his Plavix 5 days prior to his procedure on 01/01/24. Patient verbalized understanding.

## 2023-11-27 NOTE — Telephone Encounter (Signed)
  Daniel Gonzalez Nov 29, 1955 [id number]  @DATE @   Dear Dr. Beverely Low:  We have scheduled the above named patient for a(n) endo/colonoscopy procedure. Our records show that (s)he is on anticoagulation therapy.  Please advise as to whether the patient may come off their therapy of Plavix 5 days prior to their procedure which is scheduled for 01/01/24.  Please route your response to Jovita Kussmaul, CMA or fax response to [phone].  Sincerely,    [facility]

## 2023-11-27 NOTE — Patient Instructions (Signed)
 _______________________________________________________  If your blood pressure at your visit was 140/90 or greater, please contact your primary care physician to follow up on this. _______________________________________________________  If you are age 66 or older, your body mass index should be between 23-30. Your Body mass index is 21.28 kg/m. If this is out of the aforementioned range listed, please consider follow up with your Primary Care Provider. ________________________________________________________  The [facility] providers would like to encourage you to use [facility] to communicate with providers for non-urgent requests or questions.  Due to long hold times on the telephone, sending your provider a message by [facility] may be a faster and more efficient way to get a response.  Please allow 48 business hours for a response.  Please remember that this is for non-urgent requests.  _______________________________________________________  Daniel Gonzalez have been scheduled for an MRI at [facility] (Entrance A) on 12-04-23. Your appointment time is 10 am. Please arrive to admitting (at main entrance of the hospital) 30 minutes prior to your appointment time for registration purposes. Please make certain not to have anything to eat or drink 4 hours prior to your test. In addition, if you have any metal in your body, have a pacemaker or defibrillator, please be sure to let your ordering physician know. This test typically takes 45 minutes to 1 hour to complete. Should you need to reschedule, please call [phone] to do so.  You have been scheduled for an endoscopy and colonoscopy. Please follow the written instructions given to you at your visit today.  If you use inhalers (even only as needed), please bring them with you on the day of your procedure.  DO NOT TAKE 7 DAYS PRIOR TO TEST- Trulicity (dulaglutide) Ozempic, Wegovy (semaglutide) Mounjaro (tirzepatide) Bydureon Bcise (exanatide  extended release)  DO NOT TAKE 1 DAY PRIOR TO YOUR TEST Rybelsus (semaglutide) Adlyxin (lixisenatide) Victoza (liraglutide) Byetta (exanatide) _______________________________________________________________________  Due to recent changes in healthcare laws, you may see the results of your imaging and laboratory studies on MyChart before your provider has had a chance to review them.  We understand that in some cases there may be results that are confusing or concerning to you. Not all laboratory results come back in the same time frame and the provider may be waiting for multiple results in order to interpret others.  Please give [place] 48 hours in order for your provider to thoroughly review all the results before contacting the office for clarification of your results.   Thank you for choosing me and [facility].  Celso Amy, NP

## 2023-12-04 ENCOUNTER — Other Ambulatory Visit: Payer: Self-pay | Admitting: Physician Assistant

## 2023-12-04 ENCOUNTER — Ambulatory Visit (HOSPITAL_COMMUNITY)
Admission: RE | Admit: 2023-12-04 | Discharge: 2023-12-04 | Disposition: A | Discharge status: Home / Self Care | Source: Ambulatory Visit | Attending: Physician Assistant | Admitting: Physician Assistant

## 2023-12-04 DIAGNOSIS — K862 Cyst of pancreas: Secondary | ICD-10-CM

## 2023-12-04 DIAGNOSIS — I700 Atherosclerosis of aorta: Secondary | ICD-10-CM | POA: Diagnosis not present

## 2023-12-04 MED ORDER — GADOBUTROL 1 MMOL/ML IV SOLN
5.0000 mL | Freq: Once | INTRAVENOUS | Status: AC | PRN
  Administered 2023-12-04: 5 mL via INTRAVENOUS

## 2023-12-16 DIAGNOSIS — H40013 Open angle with borderline findings, low risk, bilateral: Secondary | ICD-10-CM | POA: Diagnosis not present

## 2023-12-20 NOTE — Progress Notes (Signed)
 Addendum: Reviewed and agree with assessment and management plan. Asha Grumbine, Carie Caddy, MD

## 2023-12-21 ENCOUNTER — Other Ambulatory Visit: Payer: Self-pay

## 2023-12-21 DIAGNOSIS — I10 Essential (primary) hypertension: Secondary | ICD-10-CM

## 2023-12-21 MED ORDER — METOPROLOL SUCCINATE ER 50 MG PO TB24: 50.0000 mg | ORAL_TABLET | Freq: Every day | ORAL | 3 refills | Status: AC

## 2023-12-21 MED ORDER — FAMOTIDINE 40 MG PO TABS: 40.0000 mg | ORAL_TABLET | Freq: Every day | ORAL | 1 refills | Status: DC

## 2023-12-28 ENCOUNTER — Encounter: Payer: Self-pay | Admitting: Internal Medicine

## 2023-12-28 ENCOUNTER — Encounter (HOSPITAL_COMMUNITY): Payer: Self-pay

## 2024-01-04 ENCOUNTER — Ambulatory Visit: Admitting: Internal Medicine

## 2024-01-04 ENCOUNTER — Encounter: Payer: Self-pay | Admitting: Internal Medicine

## 2024-01-04 VITALS — BP 98/68 | HR 55 | Temp 98.1°F | Resp 18 | Ht 64.0 in | Wt 124.0 lb

## 2024-01-04 DIAGNOSIS — Z860101 Personal history of adenomatous and serrated colon polyps: Secondary | ICD-10-CM | POA: Diagnosis not present

## 2024-01-04 DIAGNOSIS — D128 Benign neoplasm of rectum: Secondary | ICD-10-CM

## 2024-01-04 DIAGNOSIS — Z860100 Personal history of colon polyps, unspecified: Secondary | ICD-10-CM

## 2024-01-04 DIAGNOSIS — K604 Rectal fistula, unspecified: Secondary | ICD-10-CM

## 2024-01-04 DIAGNOSIS — D122 Benign neoplasm of ascending colon: Secondary | ICD-10-CM | POA: Diagnosis not present

## 2024-01-04 DIAGNOSIS — K449 Diaphragmatic hernia without obstruction or gangrene: Secondary | ICD-10-CM

## 2024-01-04 DIAGNOSIS — Z1211 Encounter for screening for malignant neoplasm of colon: Secondary | ICD-10-CM | POA: Diagnosis not present

## 2024-01-04 DIAGNOSIS — K5730 Diverticulosis of large intestine without perforation or abscess without bleeding: Secondary | ICD-10-CM | POA: Diagnosis not present

## 2024-01-04 DIAGNOSIS — D132 Benign neoplasm of duodenum: Secondary | ICD-10-CM | POA: Diagnosis not present

## 2024-01-04 DIAGNOSIS — K635 Polyp of colon: Secondary | ICD-10-CM | POA: Diagnosis not present

## 2024-01-04 DIAGNOSIS — K3189 Other diseases of stomach and duodenum: Secondary | ICD-10-CM

## 2024-01-04 DIAGNOSIS — K6389 Other specified diseases of intestine: Secondary | ICD-10-CM | POA: Diagnosis not present

## 2024-01-04 DIAGNOSIS — K621 Rectal polyp: Secondary | ICD-10-CM

## 2024-01-04 DIAGNOSIS — K644 Residual hemorrhoidal skin tags: Secondary | ICD-10-CM | POA: Diagnosis not present

## 2024-01-04 MED ORDER — SODIUM CHLORIDE 0.9 % IV SOLN
500.0000 mL | Freq: Once | INTRAVENOUS | Status: DC
Start: 2024-01-04 — End: 2024-01-04

## 2024-01-04 NOTE — Progress Notes (Signed)
 Called to room to assist during endoscopic procedure.  Patient ID and intended procedure confirmed with present staff. Received instructions for my participation in the procedure from the performing physician.

## 2024-01-04 NOTE — Op Note (Signed)
 [facility] Patient Name: Daniel Gonzalez Procedure Date: 01/04/2024 4:01 PM MRN: [id number] Endoscopist: Nannette Babe , MD, [id number] Age: 68 Referring MD:  Date of Birth: [birth year] Gender: Male Account #: [id number] Procedure:                Upper GI endoscopy Indications:              Follow-up of adenomatous polyp in the duodenum (EMR                            Dec 2023) Medicines:                Monitored Anesthesia Care Procedure:                Pre-Anesthesia Assessment:                           - Prior to the procedure, a History and Physical                            was performed, and patient medications and                            allergies were reviewed. The patient's tolerance of                            previous anesthesia was also reviewed. The risks                            and benefits of the procedure and the sedation                            options and risks were discussed with the patient.                            All questions were answered, and informed consent                            was obtained. Prior Anticoagulants: The patient has                            taken Plavix  (clopidogrel ), last dose was 5 days                            prior to procedure. ASA Grade Assessment: III - A                            patient with severe systemic disease. After                            reviewing the risks and benefits, the patient was                            deemed in satisfactory condition to undergo the  procedure.                           After obtaining informed consent, the endoscope was                            passed under direct vision. Throughout the                            procedure, the patient's blood pressure, pulse, and                            oxygen saturations were monitored continuously. The                            GIF HQ190 #[id number] was introduced through the                             mouth, and advanced to the second part of duodenum.                            The upper GI endoscopy was accomplished without                            difficulty. The patient tolerated the procedure                            well. Scope In: Scope Out: Findings:                 The examined esophagus was normal.                           A 2 cm hiatal hernia was present.                           The entire examined stomach was normal.                           Nodular mucosa was found in the second portion of                            the duodenum distal to mucosal tattoo. Biopsies                            were taken with a cold forceps for histology. Complications:            No immediate complications. Estimated Blood Loss:     Estimated blood loss was minimal. Impression:               - Normal esophagus.                           - 2 cm hiatal hernia.                           - Normal stomach.                           -  Nodular mucosa in the second portion of the                            duodenum. Visible mucosal tattoo. No definitive                            adenomatous tissue seen. Biopsied to exclude                            residual adenoma. Recommendation:           - Patient has a contact number available for                            emergencies. The signs and symptoms of potential                            delayed complications were discussed with the                            patient. Return to normal activities tomorrow.                            Written discharge instructions were provided to the                            patient.                           - Resume previous diet.                           - Continue present medications.                           - Await pathology results.                           - See the other procedure note for documentation of                            additional recommendations. Nannette Babe,  MD 01/04/2024 4:41:27 PM This report has been signed electronically.

## 2024-01-04 NOTE — Op Note (Signed)
 [facility] Patient Name: Daniel Gonzalez Procedure Date: 01/04/2024 3:55 PM MRN: [id number] Endoscopist: Nannette Babe , MD, [id number] Age: 68 Referring MD:  Date of Birth: December 01, 1955 Gender: Male Account #: [id number] Procedure:                Colonoscopy Indications:              High risk colon cancer surveillance: Personal                            history of multiple adenomas, Last colonoscopy: May                            2023 (TA x 10) Medicines:                Monitored Anesthesia Care Procedure:                Pre-Anesthesia Assessment:                           - Prior to the procedure, a History and Physical                            was performed, and patient medications and                            allergies were reviewed. The patient's tolerance of                            previous anesthesia was also reviewed. The risks                            and benefits of the procedure and the sedation                            options and risks were discussed with the patient.                            All questions were answered, and informed consent                            was obtained. Prior Anticoagulants: The patient has                            taken Plavix  (clopidogrel ), last dose was 5 days                            prior to procedure. ASA Grade Assessment: III - A                            patient with severe systemic disease. After                            reviewing the risks and benefits, the patient was  deemed in satisfactory condition to undergo the                            procedure.                           After obtaining informed consent, the colonoscope                            was passed under direct vision. Throughout the                            procedure, the patient's blood pressure, pulse, and                            oxygen saturations were monitored continuously. The                             [id number]-HQ190L Colonoscope [id number] was introduced                            through the anus and advanced to the cecum,                            identified by appendiceal orifice and ileocecal                            valve. The colonoscopy was performed without                            difficulty. The patient tolerated the procedure                            well. The quality of the bowel preparation was                            good. The ileocecal valve, appendiceal orifice, and                            rectum were photographed. Scope In: 4:22:42 PM Scope Out: 4:37:34 PM Scope Withdrawal Time: 0 hours 11 minutes 34 seconds  Total Procedure Duration: 0 hours 14 minutes 52 seconds  Findings:                 Skin tags were found on perianal exam.                           A 4 mm polyp was found in the ascending colon. The                            polyp was sessile. The polyp was removed with a                            cold snare. Resection and retrieval were complete.  A 7 mm polyp was found in the proximal rectum. The                            polyp was sessile. The polyp was removed with a                            cold snare. Resection and retrieval were complete.                           Many large-mouthed, medium-mouthed and                            small-mouthed diverticula were found in the sigmoid                            colon, descending colon, transverse colon and                            ascending colon. Petechia were visualized in                            association with the diverticular opening.                           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                           Retroflexion in the rectum was not performed due to                            narrow rectal vault anatomy. Complications:            No immediate complications. Estimated Blood Loss:     Estimated blood loss was minimal. Impression:                - Perianal skin tags found on perianal exam.                           - One 4 mm polyp in the ascending colon, removed                            with a cold snare. Resected and retrieved.                           - One 7 mm polyp in the proximal rectum, removed                            with a cold snare. Resected and retrieved.                           - Mild diverticulosis in the sigmoid colon, in the                            descending colon,  in the transverse colon and in                            the ascending colon. Petechia were visualized in                            association with the diverticular opening.                           - Small rectal fistula (previously biopsied). Recommendation:           - Patient has a contact number available for                            emergencies. The signs and symptoms of potential                            delayed complications were discussed with the                            patient. Return to normal activities tomorrow.                            Written discharge instructions were provided to the                            patient.                           - Resume previous diet.                           - Continue present medications.                           - Resume Plavix  (clopidogrel ) at prior dose                            tomorrow. Refer to managing physician for further                            adjustment of therapy.                           - Await pathology results.                           - Repeat colonoscopy is recommended for                            surveillance. The colonoscopy date will be                            determined after pathology results from today's                            exam become available  for review. Nannette Babe, MD 01/04/2024 4:45:29 PM This report has been signed electronically.

## 2024-01-04 NOTE — Patient Instructions (Signed)
 Restart your Plavix  tomorrow.  YOU HAD AN ENDOSCOPIC PROCEDURE TODAY AT THE [facility]:   Refer to the procedure report that was given to you for any specific questions about what was found during the examination.  If the procedure report does not answer your questions, please call your gastroenterologist to clarify.  If you requested that your care partner not be given the details of your procedure findings, then the procedure report has been included in a sealed envelope for you to review at your convenience later.  YOU SHOULD EXPECT: Some feelings of bloating in the abdomen. Passage of more gas than usual.  Walking can help get rid of the air that was put into your GI tract during the procedure and reduce the bloating. If you had a lower endoscopy (such as a colonoscopy or flexible sigmoidoscopy) you may notice spotting of blood in your stool or on the toilet paper. If you underwent a bowel prep for your procedure, you may not have a normal bowel movement for a few days.  Please Note:  You might notice some irritation and congestion in your nose or some drainage.  This is from the oxygen used during your procedure.  There is no need for concern and it should clear up in a day or so.  SYMPTOMS TO REPORT IMMEDIATELY:  Following lower endoscopy (colonoscopy or flexible sigmoidoscopy):  Excessive amounts of blood in the stool  Significant tenderness or worsening of abdominal pains  Swelling of the abdomen that is new, acute  Fever of 100F or higher  Following upper endoscopy (EGD)  Vomiting of blood or coffee ground material  New chest pain or pain under the shoulder blades  Painful or persistently difficult swallowing  New shortness of breath  Fever of 100F or higher  Black, tarry-looking stools  For urgent or emergent issues, a gastroenterologist can be reached at any hour by calling [phone]. Do not use MyChart messaging for urgent concerns.    DIET:  We do  recommend a small meal at first, but then you may proceed to your regular diet.  Drink plenty of fluids but you should avoid alcoholic beverages for 24 hours.  ACTIVITY:  You should plan to take it easy for the rest of today and you should NOT DRIVE or use heavy machinery until tomorrow (because of the sedation medicines used during the test).    FOLLOW UP: Our staff will call the number listed on your records the next business day following your procedure.  We will call around 7:15- 8:00 am to check on you and address any questions or concerns that you may have regarding the information given to you following your procedure. If we do not reach you, we will leave a message.     If any biopsies were taken you will be contacted by phone or by letter within the next 1-3 weeks.  Please call us  at [phone] if you have not heard about the biopsies in 3 weeks.    SIGNATURES/CONFIDENTIALITY: You and/or your care partner have signed paperwork which will be entered into your electronic medical record.  These signatures attest to the fact that that the information above on your After Visit Summary has been reviewed and is understood.  Full responsibility of the confidentiality of this discharge information lies with you and/or your care-partner.

## 2024-01-04 NOTE — Progress Notes (Signed)
 Pt resting comfortably. VSS. Airway intact. SBAR complete to RN. All questions answered.

## 2024-01-05 ENCOUNTER — Telehealth: Payer: Self-pay | Admitting: *Deleted

## 2024-01-05 NOTE — Telephone Encounter (Signed)
  Follow up Call-     01/04/2024    3:34 PM 01/07/2022    1:36 PM  Call back number  Post procedure Call Back phone  # [phone]  Permission to leave phone message Yes Yes     Patient questions:  Do you have a fever, pain , or abdominal swelling? No. Pain Score  0 *  Have you tolerated food without any problems? Yes.    Have you been able to return to your normal activities? Yes.    Do you have any questions about your discharge instructions: Diet   No. Medications  No. Follow up visit  No.  Do you have questions or concerns about your Care? No.  Actions: * If pain score is 4 or above: No action needed, pain <4.

## 2024-01-07 LAB — SURGICAL PATHOLOGY

## 2024-01-14 ENCOUNTER — Ambulatory Visit: Payer: Self-pay | Admitting: Internal Medicine

## 2024-01-15 NOTE — Progress Notes (Signed)
 The ASCVD Risk score (Arnett DK, et al., 2019) failed to calculate for the following reasons:   Risk score cannot be calculated because patient has a medical history suggesting prior/existing ASCVD  Arlon Bergamo, BSN, RN

## 2024-03-04 ENCOUNTER — Encounter: Payer: Self-pay | Admitting: Advanced Practice Midwife

## 2024-03-16 DIAGNOSIS — H40013 Open angle with borderline findings, low risk, bilateral: Secondary | ICD-10-CM | POA: Diagnosis not present

## 2024-03-29 ENCOUNTER — Other Ambulatory Visit: Payer: Self-pay

## 2024-03-29 MED ORDER — OLMESARTAN MEDOXOMIL 40 MG PO TABS: 40.0000 mg | ORAL_TABLET | Freq: Every day | ORAL | 1 refills | Status: AC

## 2024-04-07 ENCOUNTER — Encounter: Payer: Self-pay | Admitting: Family Medicine

## 2024-04-07 ENCOUNTER — Ambulatory Visit: Payer: Medicare Other | Admitting: Family Medicine

## 2024-04-07 VITALS — BP 108/60 | HR 53 | Temp 97.9°F | Wt 124.0 lb

## 2024-04-07 DIAGNOSIS — I10 Essential (primary) hypertension: Secondary | ICD-10-CM

## 2024-04-07 DIAGNOSIS — Z0000 Encounter for general adult medical examination without abnormal findings: Secondary | ICD-10-CM | POA: Diagnosis not present

## 2024-04-07 LAB — BASIC METABOLIC PANEL WITH GFR
BUN: 10 mg/dL (ref 6–23)
CO2: 26 meq/L (ref 19–32)
Calcium: 9.5 mg/dL (ref 8.4–10.5)
Chloride: 105 meq/L (ref 96–112)
Creatinine, Ser: 1.06 mg/dL (ref 0.40–1.50)
GFR: 73.46 mL/min (ref >60.00)
Glucose, Bld: 96 mg/dL (ref 70–99)
Potassium: 4.3 meq/L (ref 3.5–5.1)
Sodium: 139 meq/L (ref 135–145)

## 2024-04-07 LAB — CBC WITH DIFFERENTIAL/PLATELET
Basophils Absolute: 0 K/uL (ref 0.0–0.1)
Basophils Relative: 0.3 % (ref 0.0–3.0)
Eosinophils Absolute: 0.1 K/uL (ref 0.0–0.7)
Eosinophils Relative: 1.4 % (ref 0.0–5.0)
HCT: 39.3 % (ref 39.0–52.0)
Hemoglobin: 13.2 g/dL (ref 13.0–17.0)
Lymphocytes Relative: 49.9 % — ABNORMAL HIGH (ref 12.0–46.0)
Lymphs Abs: 3 K/uL (ref 0.7–4.0)
MCHC: 33.6 g/dL (ref 30.0–36.0)
MCV: 95.2 fl (ref 78.0–100.0)
Monocytes Absolute: 0.7 K/uL (ref 0.1–1.0)
Monocytes Relative: 11.4 % (ref 3.0–12.0)
Neutro Abs: 2.3 K/uL (ref 1.4–7.7)
Neutrophils Relative %: 37 % — ABNORMAL LOW (ref 43.0–77.0)
Platelets: 240 K/uL (ref 150.0–400.0)
RBC: 4.13 Mil/uL — ABNORMAL LOW (ref 4.22–5.81)
RDW: 14.7 % (ref 11.5–15.5)
WBC: 6.1 K/uL (ref 4.0–10.5)

## 2024-04-07 LAB — HEPATIC FUNCTION PANEL
ALT: 12 U/L (ref 0–53)
AST: 13 U/L (ref 0–37)
Albumin: 4.5 g/dL (ref 3.5–5.2)
Alkaline Phosphatase: 75 U/L (ref 39–117)
Bilirubin, Direct: 0.1 mg/dL (ref 0.0–0.3)
Total Bilirubin: 0.3 mg/dL (ref 0.2–1.2)
Total Protein: 7.5 g/dL (ref 6.0–8.3)

## 2024-04-07 LAB — LIPID PANEL
Cholesterol: 151 mg/dL (ref 0–200)
HDL: 34.4 mg/dL — ABNORMAL LOW (ref >39.00)
LDL Cholesterol: 79 mg/dL (ref 0–99)
NonHDL: 116.27
Total CHOL/HDL Ratio: 4
Triglycerides: 184 mg/dL — ABNORMAL HIGH (ref 0.0–149.0)
VLDL: 36.8 mg/dL (ref 0.0–40.0)

## 2024-04-07 LAB — TSH: TSH: 1.81 u[IU]/mL (ref 0.35–5.50)

## 2024-04-07 NOTE — Patient Instructions (Signed)
 Schedule a nurse visit in mid-late September for flu shot Follow up w/ me in 6 months to recheck blood pressure and cholesterol We'll notify you of your lab results and make any changes if needed Try and make healthy food choices QUIT SMOKING!!!  You can do it! Call with any questions or concerns Stay Safe!  Stay Healthy! Enjoy the rest of your summer!!

## 2024-04-07 NOTE — Progress Notes (Signed)
 Subjective:    Patient ID: Daniel Gonzalez, male    DOB: May 21, 1956, 68 y.o.   MRN: [id number]  HPI Here today for Welcome to Medicare CPE.  Risk Factors: HTN- on Amlodipine , Metoprolol , Benicar , HIV- following w/ ID Hyperlipidemia- on Crestor  Prostate Cancer- following w/ Urology Protein Calorie Malnutrition- weight is stable since last visit Cigarette Smoker- not willing to quit Physical Activity: no regular exercise Fall Risk: no recent falls, low risk Depression: denies current sxs Hearing: normal to conversational tones, mildly decreased to whispered voice at 6 feet ADL's: independent Cognitive: normal linear thought process, memory and attention intact Home Safety: safe at home Height, Weight, BMI, Visual Acuity: see vitals, vision corrected w/ glasses Counseling: UTD on Tdap, PNA, shingles, colonoscopy,  Health Care POA/Living Will: does not have either- forms provided.  Pt designates his daughter to make decisions if needed Labs Ordered: See A&P Care Plan: See A&P   Addiction risk- pt has episodic alcohol abuse but has decreased his intake over the years, continues to smoke cigarettes  Patient Care Team    Relationship Specialty Notifications Start End  Mahlon Comer BRAVO, MD PCP - General Family Medicine  09/16/12   Elaine Rush, MD (Inactive) PCP - Infectious Diseases Infectious Diseases  07/31/10   Grayce Buddle, RN Oncology Nurse Navigator   12/04/20     Health Maintenance  Topic Date Due   Medicare Annual Wellness (AWV)  Never done   COVID-19 Vaccine (4 - 2024-25 season) 04/19/2023   INFLUENZA VACCINE  03/18/2024   DTaP/Tdap/Td (2 - Td or Tdap) 12/13/2025   Colonoscopy  01/04/2027   Pneumococcal Vaccine: 50+ Years  Completed   Hepatitis B Vaccines 19-59 Average Risk  Completed   Hepatitis C Screening  Completed   HIV Screening  Completed   Zoster Vaccines- Shingrix  Completed   HPV VACCINES  Aged Out   Meningococcal B Vaccine  Aged Out     Review of  Systems Patient reports no vision/hearing changes, anorexia, fever ,adenopathy, persistant/recurrent hoarseness, swallowing issues, chest pain, palpitations, edema, persistant/recurrent cough, hemoptysis, dyspnea (rest,exertional, paroxysmal nocturnal), gastrointestinal  bleeding (melena, rectal bleeding), abdominal pain, excessive heart burn, GU symptoms (dysuria, hematuria, voiding/incontinence issues) syncope, focal weakness, memory loss, numbness & tingling, skin/hair/nail changes, depression, anxiety, abnormal bruising/bleeding, musculoskeletal symptoms/signs.     Objective:   Physical Exam General Appearance:    Alert, cooperative, no distress, appears stated age  Head:    Normocephalic, without obvious abnormality, atraumatic  Eyes:    PERRL, conjunctiva/corneas clear, EOM's intact, fundi    benign, both eyes       Ears:    Normal TM's and external ear canals, both ears  Nose:   Nares normal, septum midline, mucosa normal, no drainage   or sinus tenderness  Throat:   Lips, mucosa, and tongue normal; teeth and gums normal  Neck:   Supple, symmetrical, trachea midline, no adenopathy;       thyroid :  No enlargement/tenderness/nodules  Back:     Symmetric, no curvature, ROM normal, no CVA tenderness  Lungs:     Clear to auscultation bilaterally, respirations unlabored  Chest wall:    No tenderness or deformity  Heart:    Regular rate and rhythm, S1 and S2 normal, no murmur, rub   or gallop  Abdomen:     Soft, non-tender, bowel sounds active all four quadrants,    no masses, no organomegaly  Genitalia:    deferred  Rectal:    Extremities:  Extremities normal, atraumatic, no cyanosis or edema  Pulses:   2+ and symmetric all extremities  Skin:   Skin color, texture, turgor normal, no rashes or lesions  Lymph nodes:   Cervical, supraclavicular, and axillary nodes normal  Neurologic:   CNII-XII intact. Normal strength, sensation and reflexes      throughout          Assessment &  Plan:

## 2024-04-08 ENCOUNTER — Ambulatory Visit: Payer: Self-pay | Admitting: Family Medicine

## 2024-04-12 NOTE — Assessment & Plan Note (Signed)
 Pt's PE unchanged from previous.  UTD on Tdap, PNA, shingles, colonoscopy.  Encouraged him to designate a Healthcare POA and create a living will.  Stressed need for healthy diet.  Check labs.

## 2024-04-14 ENCOUNTER — Other Ambulatory Visit: Payer: Self-pay

## 2024-04-14 ENCOUNTER — Encounter: Payer: Medicare Other | Admitting: Family Medicine

## 2024-04-14 DIAGNOSIS — G4700 Insomnia, unspecified: Secondary | ICD-10-CM

## 2024-04-14 MED ORDER — TRAZODONE HCL 100 MG PO TABS: 100.0000 mg | ORAL_TABLET | Freq: Every day | ORAL | 1 refills | Status: AC

## 2024-04-14 MED ORDER — AMLODIPINE BESYLATE 5 MG PO TABS: 5.0000 mg | ORAL_TABLET | Freq: Every day | ORAL | 1 refills | Status: AC

## 2024-05-18 ENCOUNTER — Encounter: Payer: Self-pay | Admitting: Internal Medicine

## 2024-05-18 ENCOUNTER — Ambulatory Visit

## 2024-05-18 ENCOUNTER — Ambulatory Visit: Admitting: Internal Medicine

## 2024-05-18 ENCOUNTER — Other Ambulatory Visit: Payer: Self-pay

## 2024-05-18 VITALS — BP 135/87 | HR 61 | Temp 97.9°F | Resp 16 | Wt 123.4 lb

## 2024-05-18 DIAGNOSIS — F1210 Cannabis abuse, uncomplicated: Secondary | ICD-10-CM

## 2024-05-18 DIAGNOSIS — Z23 Encounter for immunization: Secondary | ICD-10-CM

## 2024-05-18 DIAGNOSIS — Z21 Asymptomatic human immunodeficiency virus [HIV] infection status: Secondary | ICD-10-CM

## 2024-05-18 DIAGNOSIS — Z79899 Other long term (current) drug therapy: Secondary | ICD-10-CM

## 2024-05-18 DIAGNOSIS — F17210 Nicotine dependence, cigarettes, uncomplicated: Secondary | ICD-10-CM

## 2024-05-18 DIAGNOSIS — B20 Human immunodeficiency virus [HIV] disease: Secondary | ICD-10-CM | POA: Diagnosis not present

## 2024-05-18 MED ORDER — PREZCOBIX 800-150 MG PO TABS
ORAL_TABLET | ORAL | 6 refills | Status: DC
Start: 2024-05-18 — End: 2024-06-20

## 2024-05-18 MED ORDER — BIKTARVY 50-200-25 MG PO TABS: 1.0000 | ORAL_TABLET | Freq: Every day | ORAL | 6 refills | Status: DC

## 2024-05-18 NOTE — Progress Notes (Signed)
 [facility]     HPI: Daniel Gonzalez is a 68 y.o. male presents for HIV management. Today 05/18/24:  no missed doses. No sexual partners since LV.  Date of diagnosis Early 39s, late 35s ART exposure  Kaletra , viread , combivir , juluca , [name] Past Npd:wnwz that he knows off Risk factors: heterosexual Partners in last 2months 0, in the last 12 months0.  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 Family doesn't know about HIV Understanding of HIV: Etoh/drug/tobacco use: Sober 1 year ago/ MJ/yes   Past Medical History:  Diagnosis Date   Anal fistula    Aortic atherosclerosis    Diverticulosis    GERD (gastroesophageal reflux disease)    Hiatal hernia    HIV infection (HCC)    Hypertension    Malignant neoplasm of prostate (HCC) 06/19/2020   Pancreatic cyst    Pneumonia yrs ago   walking pneumonia   Prostate cancer (HCC)    Stroke (HCC) yrs ago   no current neurlogist   Tubular adenoma of colon    Wears contact lenses    Wears dentures    upper full lower partial    Past Surgical History:  Procedure Laterality Date   ? seton placement 10 years ago      BIOPSY  06/02/2022   Procedure: BIOPSY;  Surgeon: Mansouraty, Aloha Raddle., MD;  Location: [facility];  Service: Gastroenterology;;   COLONOSCOPY     cyst in back     removed from back   ENDOSCOPIC MUCOSAL RESECTION N/A 07/31/2022   Procedure: ENDOSCOPIC MUCOSAL RESECTION;  Surgeon: Wilhelmenia Aloha Raddle., MD;  Location: [facility];  Service: Gastroenterology;  Laterality: N/A;   ESOPHAGOGASTRODUODENOSCOPY N/A 06/02/2022   Procedure: ESOPHAGOGASTRODUODENOSCOPY (EGD);  Surgeon: Wilhelmenia Aloha Raddle., MD;  Location: [facility];  Service: Gastroenterology;  Laterality: N/A;   ESOPHAGOGASTRODUODENOSCOPY (EGD) WITH PROPOFOL   N/A 07/31/2022   Procedure: ESOPHAGOGASTRODUODENOSCOPY (EGD) WITH PROPOFOL ;  Surgeon: Wilhelmenia Aloha Raddle., MD;  Location: [facility];  Service: Gastroenterology;  Laterality: N/A;   EUS N/A 06/02/2022   Procedure: UPPER ENDOSCOPIC ULTRASOUND (EUS) RADIAL;  Surgeon: Wilhelmenia Aloha Raddle., MD;  Location: [facility];  Service: Gastroenterology;  Laterality: N/A;   FINE NEEDLE ASPIRATION N/A 06/02/2022   Procedure: FINE NEEDLE ASPIRATION (FNA) LINEAR;  Surgeon: Wilhelmenia Aloha Raddle., MD;  Location: [facility];  Service: Gastroenterology;  Laterality: N/A;   HEMOSTASIS CLIP PLACEMENT  07/31/2022   Procedure: HEMOSTASIS CLIP PLACEMENT;  Surgeon: Wilhelmenia Aloha Raddle., MD;  Location: [facility];  Service: Gastroenterology;;   PROSTATE BIOPSY  2021   RADIOACTIVE SEED IMPLANT N/A 09/10/2020   Procedure: RADIOACTIVE SEED IMPLANT/BRACHYTHERAPY IMPLANT;  Surgeon: Carolee Sherwood JONETTA DOUGLAS, MD;  Location[facility];  Service: Urology;  Laterality: N/A;   SPACE OAR INSTILLATION N/A 09/10/2020   Procedure: SPACE OAR INSTILLATION;  Surgeon: Carolee Sherwood JONETTA DOUGLAS, MD;  Location: [facility];  Service: Urology;  Laterality: N/A;   SUBMUCOSAL LIFTING INJECTION  07/31/2022   Procedure: SUBMUCOSAL LIFTING INJECTION;  Surgeon: Wilhelmenia Aloha Raddle., MD;  Location: [facility];  Service: Gastroenterology;;   SUBMUCOSAL TATTOO INJECTION  07/31/2022   Procedure: SUBMUCOSAL TATTOO INJECTION;  Surgeon: Wilhelmenia Aloha Raddle., MD;  Location: [facility];  Service: Gastroenterology;;   TEE WITHOUT CARDIOVERSION N/A 06/24/2013   Procedure: TRANSESOPHAGEAL ECHOCARDIOGRAM (TEE);  Surgeon: Toribio JONELLE Fuel, MD;  Location: [facility];  Service: Cardiovascular;  Laterality: N/A;    Family History  Problem Relation Age of Onset   Arthritis Mother    Heart disease Mother    Hypertension Mother    Diabetes Mother    Arthritis Father    Heart disease Father    Hypertension  Father    Cancer Father    Lung cancer Brother    Prostate cancer Brother    Prostate cancer Brother    Colon cancer Neg Hx    Pancreatic cancer Neg Hx    Breast cancer Neg Hx    Esophageal cancer Neg Hx    Rectal cancer Neg Hx    Ovarian cancer Neg Hx    Stomach cancer Neg Hx    Current Outpatient Medications on File Prior to Visit  Medication Sig Dispense Refill   acetaminophen  (TYLENOL ) 650 MG CR tablet Take 1,300 mg by mouth in the morning and at bedtime.     amLODipine  (NORVASC ) 5 MG tablet Take 1 tablet (5 mg total) by mouth daily. 90 tablet 1   Apoaequorin (PREVAGEN PO) Take by mouth.     bictegravir-emtricitabine -tenofovir  AF (BIKTARVY ) 50-200-25 MG TABS tablet Take 1 tablet by mouth daily. 30 tablet 5   clopidogrel  (PLAVIX ) 75 MG tablet Take 1 tablet (75 mg total) by mouth daily. 90 tablet 3   darunavir -cobicistat  ([name] ) 800-150 MG tablet Take 1 tablet by mouth daily with food. do not  crush, break or chew tablets 30 tablet 5   metoprolol  succinate (TOPROL -XL) 50 MG 24 hr tablet Take 1 tablet (50 mg total) by mouth daily. Take with or immediately following a meal. 90 tablet 3   Misc. Devices (PILL SPLITTER) MISC Please use to cut trazodone  in 1/2. 1 each 0   Multiple Vitamins-Minerals (MULTIVITAMIN MEN) TABS Take 1 tablet by mouth daily.     olmesartan  (BENICAR ) 40 MG tablet Take 1 tablet (40 mg total) by mouth daily. 90 tablet 1   Propylene Glycol (SYSTANE BALANCE) 0.6 % SOLN Place 1 drop into both eyes as needed (dry eyes).     rosuvastatin  (CRESTOR ) 10 MG tablet Take 1 tablet (10 mg total) by mouth daily. 30 tablet 6   tamsulosin (FLOMAX) 0.4 MG CAPS capsule Take 0.4 mg by mouth in the morning and at bedtime.     traZODone  (DESYREL ) 100 MG tablet Take 1 tablet (100 mg total) by mouth at bedtime. 90 tablet 1   vitamin B-12 (CYANOCOBALAMIN ) 1000 MCG tablet Take 1 tablet (1,000 mcg total) by mouth daily.     No current facility-administered medications on file prior to  visit.    Allergies  Allergen Reactions   Asa [Aspirin ] Palpitations    Speeds up heart rate   Sulfamethoxazole -Trimethoprim  Itching      Lab Results HIV 1 RNA Quant  Date Value  11/17/2023 NOT DETECTED copies/mL  04/15/2023 Not Detected Copies/mL  04/17/2022 <20 Copies/mL (H)   CD4 T Cell Abs (/uL)  Date Value  11/17/2023 786  04/17/2022 703  04/16/2021 644   No results found for: HIV1GENOSEQ Lab Results  Component Value Date   WBC 6.1 04/07/2024   HGB 13.2 04/07/2024   HCT 39.3 04/07/2024   MCV 95.2 04/07/2024   PLT 240.0 04/07/2024    Lab Results  Component Value Date   CREATININE 1.06 04/07/2024   BUN 10 04/07/2024   NA 139 04/07/2024   K 4.3 04/07/2024   CL 105 04/07/2024  CO2 26 04/07/2024   Lab Results  Component Value Date   ALT 12 04/07/2024   AST 13 04/07/2024   ALKPHOS 75 04/07/2024   BILITOT 0.3 04/07/2024    Lab Results  Component Value Date   CHOL 151 04/07/2024   TRIG 184.0 (H) 04/07/2024   HDL 34.40 (L) 04/07/2024   LDLCALC 79 04/07/2024   Lab Results  Component Value Date   HAV NON-REACTIVE 04/15/2023   Lab Results  Component Value Date   HEPBSAG No 10/12/2006   HEPBSAB NON-REACTIVE 04/15/2023   Lab Results  Component Value Date   HCVAB No 10/12/2006   Lab Results  Component Value Date   CHLAMYDIAWP Negative 11/17/2023   N Negative 11/17/2023   No results found for: GCPROBEAPT No results found for: QUANTGOLD  Assessment/Plan #HIV/Asx -CD4 786, VL ND, on 11/17/23 -ON Biktarvy  and prezcobix  -F/U in 6 months, labs today     #tobacco abuse/MJ cessation counseling -No plans for smoking cessation Coughs only when smoking amrjuana, does not plan on cessation.         #Vaccination COVID today Flu-today Monkeypox PCV202/3/25 Meningitis HepAnot immune 04/15/23 setorology. Needs 2nd dose of HAV HEpB not immune 8/28/24serology. Sp hbv series. Needs 2nd dose of hbv Tdap 2017 Shingles   #Health  maintenance -Quantiferon negative 04/15/23 -RPRNV -HCV negative 04/15/23 -GC urinen egative 11/17/23 -Lipid panel on 04/07/23 on Risuvastatin The ASCVD Risk score (Arnett DK, et al., 2019) failed to calculate for the following reasons:   Risk score cannot be calculated because patient has a medical history suggesting prior/existing ASCVD -Colonoscopy- scheduled this year per patient  Loney Stank, MD [facility] I have personally spent 42 minutes involved in face-to-face and non-face-to-face activities for this patient on the day of the visit. Professional time spent includes the following activities: Preparing to see the patient (review of tests), Obtaining and/or reviewing separately obtained history (admission/discharge record), Performing a medically appropriate examination and/or evaluation , Ordering medications/tests/procedures, referring and communicating with other health care professionals, Documenting clinical information in the EMR, Independently interpreting results (not separately reported), Communicating results to the patient/family/caregiver, Counseling and educating the patient/family/caregiver and Care coordination (not separately reported).

## 2024-05-19 LAB — T-HELPER CELLS (CD4) COUNT (NOT AT ARMC)
CD4 % Helper T Cell: 25 % — ABNORMAL LOW (ref 33–65)
CD4 T Cell Abs: 838 /uL (ref 400–1790)

## 2024-05-20 LAB — HIV-1 RNA QUANT-NO REFLEX-BLD
HIV 1 RNA Quant: NOT DETECTED {copies}/mL
HIV-1 RNA Quant, Log: NOT DETECTED {Log_copies}/mL

## 2024-05-20 LAB — COMPLETE METABOLIC PANEL WITHOUT GFR
AG Ratio: 1.7 (calc) (ref 1.0–2.5)
ALT: 12 U/L (ref 9–46)
AST: 13 U/L (ref 10–35)
Albumin: 4.7 g/dL (ref 3.6–5.1)
Alkaline phosphatase (APISO): 70 U/L (ref 35–144)
BUN: 11 mg/dL (ref 7–25)
CO2: 26 mmol/L (ref 20–32)
Calcium: 9.8 mg/dL (ref 8.6–10.3)
Chloride: 108 mmol/L (ref 98–110)
Creat: 1.04 mg/dL (ref 0.70–1.35)
Globulin: 2.7 g/dL (ref 1.9–3.7)
Glucose, Bld: 101 mg/dL — ABNORMAL HIGH (ref 65–99)
Potassium: 4 mmol/L (ref 3.5–5.3)
Sodium: 140 mmol/L (ref 135–146)
Total Bilirubin: 0.3 mg/dL (ref 0.2–1.2)
Total Protein: 7.4 g/dL (ref 6.1–8.1)

## 2024-05-20 LAB — CBC WITH DIFFERENTIAL/PLATELET
Absolute Lymphocytes: 3798 {cells}/uL (ref 850–3900)
Absolute Monocytes: 818 {cells}/uL (ref 200–950)
Basophils Absolute: 19 {cells}/uL (ref 0–200)
Basophils Relative: 0.2 %
Eosinophils Absolute: 113 {cells}/uL (ref 15–500)
Eosinophils Relative: 1.2 %
HCT: 39.5 % (ref 38.5–50.0)
Hemoglobin: 13.3 g/dL (ref 13.2–17.1)
MCH: 31.6 pg (ref 27.0–33.0)
MCHC: 33.7 g/dL (ref 32.0–36.0)
MCV: 93.8 fL (ref 80.0–100.0)
MPV: 9.2 fL (ref 7.5–12.5)
Monocytes Relative: 8.7 %
Neutro Abs: 4653 {cells}/uL (ref 1500–7800)
Neutrophils Relative %: 49.5 %
Platelets: 250 Thousand/uL (ref 140–400)
RBC: 4.21 Million/uL (ref 4.20–5.80)
RDW: 13.5 % (ref 11.0–15.0)
Total Lymphocyte: 40.4 %
WBC: 9.4 Thousand/uL (ref 3.8–10.8)

## 2024-06-04 ENCOUNTER — Ambulatory Visit (HOSPITAL_COMMUNITY)
Admission: EM | Admit: 2024-06-04 | Discharge: 2024-06-04 | Disposition: A | Discharge status: Home / Self Care | Attending: Physician Assistant | Admitting: Physician Assistant

## 2024-06-04 ENCOUNTER — Encounter (HOSPITAL_COMMUNITY): Payer: Self-pay | Admitting: Emergency Medicine

## 2024-06-04 ENCOUNTER — Ambulatory Visit (HOSPITAL_COMMUNITY): Payer: Self-pay | Admitting: Physician Assistant

## 2024-06-04 ENCOUNTER — Ambulatory Visit (INDEPENDENT_AMBULATORY_CARE_PROVIDER_SITE_OTHER)

## 2024-06-04 DIAGNOSIS — M545 Low back pain, unspecified: Secondary | ICD-10-CM | POA: Diagnosis not present

## 2024-06-04 DIAGNOSIS — M47816 Spondylosis without myelopathy or radiculopathy, lumbar region: Secondary | ICD-10-CM | POA: Diagnosis not present

## 2024-06-04 MED ORDER — BACLOFEN 5 MG PO TABS: 5.0000 mg | ORAL_TABLET | Freq: Every evening | ORAL | 0 refills | Status: AC | PRN

## 2024-06-04 MED ORDER — LIDOCAINE 5 % EX PTCH: 1.0000 | MEDICATED_PATCH | CUTANEOUS | 0 refills | Status: DC

## 2024-06-04 NOTE — Discharge Instructions (Addendum)
 Your x-ray showed arthritis but nothing broken or out of place.  Start baclofen at night to help with the pain and muscle spasms.  This will make you sleepy so do not drive or drink alcohol with taking it.  Apply lidocaine  patch to the area of tenderness during the day and then remove this at night; use only 1 patch for 24 hours.  You can continue Tylenol  for pain.  Follow-up with sports medicine for further evaluation and management if your symptoms are not improving within a few weeks with the medication I prescribed.  If anything worsens you have increasing pain, difficulty walking, going to the bathroom on yourself without noticing it, fever, weakness you need to be seen immediately.

## 2024-06-04 NOTE — ED Triage Notes (Signed)
 Pt c/o left lower back pain off and on x's 3 weeks.  No known injury   St's if feels like spasms  St's it's a sharp pain lasting approx 5 min. Then goes away

## 2024-06-04 NOTE — ED Provider Notes (Signed)
 [facility]    CSN: [id number] Arrival date & time: 06/04/24  1205      History   Chief Complaint Chief Complaint  Patient presents with   Back Pain    HPI Daniel Gonzalez is a 68 y.o. male.   Patient presents today with a 3-week history of intermittent left lower back pain.  He denies any known injury or increase in activity prior to symptom onset.  He denies previous injury, recent severe car accident, previous back surgery, previous spinal procedure.  He reports that the pain is rated 8 during episodes, lasts for a few minutes then resolves without intervention, no identifiable trigger, he has not tried any over-the-counter medications for symptom management other than the Tylenol  that he takes every day.  He denies any bowel/bladder continence, lower extremity weakness, saddle anesthesia.  He describes the pain as an intense tightness/spasm.  He does have history of prostate cancer but is not currently receiving treatment.    Past Medical History:  Diagnosis Date   Anal fistula    Aortic atherosclerosis    Diverticulosis    GERD (gastroesophageal reflux disease)    Hiatal hernia    HIV infection (HCC)    Hypertension    Malignant neoplasm of prostate (HCC) 06/19/2020   Pancreatic cyst    Pneumonia yrs ago   walking pneumonia   Prostate cancer (HCC)    Stroke (HCC) yrs ago   no current neurlogist   Tubular adenoma of colon    Wears contact lenses    Wears dentures    upper full lower partial    Patient Active Problem List   Diagnosis Date Noted   Hyperlipidemia 10/14/2023   Insomnia 10/18/2020   Malignant neoplasm of prostate (HCC) 06/19/2020   Depression 04/17/2020   Chronic diarrhea 09/07/2019   Ruptured epithelial cyst 06/08/2019   Impacted cerumen of left ear 05/17/2019   B12 deficiency 10/23/2017   Unintentional weight loss 12/15/2016   Protein-calorie malnutrition 08/25/2015   Normocytic anemia 08/24/2015   Cerebral aneurysm,  nonruptured 09/02/2013   History of CVA (cerebrovascular accident) 06/22/2013   Routine general medical examination at a health care facility 01/07/2013   Essential hypertension 05/01/2010   CARPAL TUNNEL SYNDROME 01/02/2010   Human immunodeficiency virus (HIV) disease (HCC) 09/17/2006   GENITAL HERPES 09/17/2006   ERECTILE DYSFUNCTION 09/17/2006   ABUSE, ALCOHOL, 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 seton placement 10 years ago      BIOPSY  06/02/2022   Procedure: BIOPSY;  Surgeon: Wilhelmenia Aloha Raddle., MD;  Location: [facility];  Service: Gastroenterology;;   COLONOSCOPY     cyst in back     removed from back   ENDOSCOPIC MUCOSAL RESECTION N/A 07/31/2022   Procedure: ENDOSCOPIC MUCOSAL RESECTION;  Surgeon: Wilhelmenia Aloha Raddle., MD;  Location: [facility];  Service: Gastroenterology;  Laterality: N/A;   ESOPHAGOGASTRODUODENOSCOPY N/A 06/02/2022   Procedure: ESOPHAGOGASTRODUODENOSCOPY (EGD);  Surgeon: Wilhelmenia Aloha Raddle., MD;  Location: [facility];  Service: Gastroenterology;  Laterality: N/A;   ESOPHAGOGASTRODUODENOSCOPY (EGD) WITH PROPOFOL  N/A 07/31/2022   Procedure: ESOPHAGOGASTRODUODENOSCOPY (EGD) WITH PROPOFOL ;  Surgeon: Wilhelmenia Aloha Raddle., MD;  Location: [facility];  Service: Gastroenterology;  Laterality: N/A;   EUS N/A 06/02/2022   Procedure: UPPER ENDOSCOPIC ULTRASOUND (EUS) RADIAL;  Surgeon: Wilhelmenia Aloha Raddle., MD;  Location: [facility];  Service: Gastroenterology;  Laterality: N/A;   FINE  NEEDLE ASPIRATION N/A 06/02/2022   Procedure: FINE NEEDLE ASPIRATION (FNA) LINEAR;  Surgeon: Wilhelmenia Aloha Raddle., MD;  Location: [facility];  Service: Gastroenterology;  Laterality: N/A;   HEMOSTASIS CLIP PLACEMENT  07/31/2022   Procedure: HEMOSTASIS CLIP PLACEMENT;  Surgeon: Wilhelmenia Aloha Raddle., MD;  Location: [facility];  Service:  Gastroenterology;;   PROSTATE BIOPSY  2021   RADIOACTIVE SEED IMPLANT N/A 09/10/2020   Procedure: RADIOACTIVE SEED IMPLANT/BRACHYTHERAPY IMPLANT;  Surgeon: Carolee Sherwood JONETTA DOUGLAS, MD;  Location: [facility];  Service: Urology;  Laterality: N/A;   SPACE OAR INSTILLATION N/A 09/10/2020   Procedure: SPACE OAR INSTILLATION;  Surgeon: Carolee Sherwood JONETTA DOUGLAS, MD;  Location: [facility];  Service: Urology;  Laterality: N/A;   SUBMUCOSAL LIFTING INJECTION  07/31/2022   Procedure: SUBMUCOSAL LIFTING INJECTION;  Surgeon: Wilhelmenia Aloha Raddle., MD;  Location: [facility];  Service: Gastroenterology;;   SUBMUCOSAL TATTOO INJECTION  07/31/2022   Procedure: SUBMUCOSAL TATTOO INJECTION;  Surgeon: Wilhelmenia Aloha Raddle., MD;  Location: [facility];  Service: Gastroenterology;;   TEE WITHOUT CARDIOVERSION N/A 06/24/2013   Procedure: TRANSESOPHAGEAL ECHOCARDIOGRAM (TEE);  Surgeon: Toribio JONELLE Fuel, MD;  Location: [facility];  Service: Cardiovascular;  Laterality: N/A;       Home Medications    Prior to Admission medications   Medication Sig Start Date End Date Taking? Authorizing Provider  Baclofen 5 MG TABS Take 1 tablet (5 mg total) by mouth at bedtime as needed. 06/04/24  Yes Inge Waldroup K, PA-C  lidocaine  (LIDODERM ) 5 % Place 1 patch onto the skin daily. Remove & Discard patch within 12 hours or as directed by MD 06/04/24  Yes Alexie Lanni, Rocky K, PA-C  acetaminophen  (TYLENOL ) 650 MG CR tablet Take 1,300 mg by mouth in the morning and at bedtime.    [provider]  amLODipine  (NORVASC ) 5 MG tablet Take 1 tablet (5 mg total) by mouth daily. 04/14/24   Tabori, Katherine E, MD  Apoaequorin (PREVAGEN PO) Take by mouth.    [provider]  bictegravir-emtricitabine -tenofovir  AF (BIKTARVY ) 50-200-25 MG TABS tablet Take 1 tablet by mouth daily. 05/18/24   Dennise Kingsley, MD  clopidogrel  (PLAVIX ) 75 MG tablet Take 1 tablet (75 mg total) by mouth daily. 09/29/23    Tabori, Katherine E, MD  darunavir -cobicistat  ([name] ) 800-150 MG tablet Take 1 tablet by mouth daily with food. do not  crush, break or chew tablets 05/18/24   Dennise Kingsley, MD  metoprolol  succinate (TOPROL -XL) 50 MG 24 hr tablet Take 1 tablet (50 mg total) by mouth daily. Take with or immediately following a meal. 12/21/23   Mahlon Comer BRAVO, MD  Misc. Devices (PILL SPLITTER) MISC Please use to cut trazodone  in 1/2. 01/27/18   Tabori, Katherine E, MD  Multiple Vitamins-Minerals (MULTIVITAMIN MEN) TABS Take 1 tablet by mouth daily.    [provider]  olmesartan  (BENICAR ) 40 MG tablet Take 1 tablet (40 mg total) by mouth daily. 03/29/24   Tabori, Katherine E, MD  Propylene Glycol (SYSTANE BALANCE) 0.6 % SOLN Place 1 drop into both eyes as needed (dry eyes).    [provider]  rosuvastatin  (CRESTOR ) 10 MG tablet Take 1 tablet (10 mg total) by mouth daily. 09/15/23   Tabori, Katherine E, MD  tamsulosin (FLOMAX) 0.4 MG CAPS capsule Take 0.4 mg by mouth in the morning and at bedtime. 12/24/21   [provider]  traZODone  (DESYREL ) 100 MG tablet Take 1 tablet (100 mg total) by mouth at bedtime. 04/14/24   Mahlon,  Katherine E, MD  vitamin B-12 (CYANOCOBALAMIN ) 1000 MCG tablet Take 1 tablet (1,000 mcg total) by mouth daily. 12/16/21   Kerman Vina HERO, NP    Family History Family History  Problem Relation Age of Onset   Arthritis Mother    Heart disease Mother    Hypertension Mother    Diabetes Mother    Arthritis Father    Heart disease Father    Hypertension Father    Cancer Father    Lung cancer Brother    Prostate cancer Brother    Prostate cancer Brother    Colon cancer Neg Hx    Pancreatic cancer Neg Hx    Breast cancer Neg Hx    Esophageal cancer Neg Hx    Rectal cancer Neg Hx    Ovarian cancer Neg Hx    Stomach cancer Neg Hx     Social History Social History   Tobacco Use   Smoking status: Every Day    Current packs/day: 0.50    Average packs/day:  0.5 packs/day for 35.0 years (17.5 ttl pk-yrs)    Types: Cigarettes   Smokeless tobacco: Never  Vaping Use   Vaping status: Never Used  Substance Use Topics   Alcohol use: Not Currently   Drug use: Yes    Frequency: 7.0 times per week    Types: Marijuana    Comment: weekly     Allergies   Asa [aspirin ] and Sulfamethoxazole -trimethoprim    Review of Systems Review of Systems  Constitutional:  Positive for activity change. Negative for appetite change, fatigue, fever and unexpected weight change.  Gastrointestinal:  Negative for diarrhea, nausea and vomiting.  Genitourinary:  Negative for difficulty urinating, enuresis, frequency and urgency.  Musculoskeletal:  Positive for back pain. Negative for arthralgias and myalgias.  Skin:  Negative for rash.  Neurological:  Negative for weakness and numbness.     Physical Exam Triage Vital Signs ED Triage Vitals [06/04/24 1343]  Encounter Vitals Group     BP 117/81     Girls Systolic BP Percentile      Girls Diastolic BP Percentile      Boys Systolic BP Percentile      Boys Diastolic BP Percentile      Pulse Rate (!) 57     Resp 18     Temp 97.6 F (36.4 C)     Temp Source Oral     SpO2 97 %     Weight 125 lb (56.7 kg)     Height 5' 4 (1.626 m)     Head Circumference      Peak Flow      Pain Score 0     Pain Loc      Pain Education      Exclude from Growth Chart    No data found.  Updated Vital Signs BP 117/81 (BP Location: Right Arm)   Pulse (!) 57   Temp 97.6 F (36.4 C) (Oral)   Resp 18   Ht 5' 4 (1.626 m)   Wt 125 lb (56.7 kg)   SpO2 97%   BMI 21.46 kg/m   Visual Acuity Right Eye Distance:   Left Eye Distance:   Bilateral Distance:    Right Eye Near:   Left Eye Near:    Bilateral Near:     Physical Exam Vitals reviewed.  Constitutional:      General: He is awake.     Appearance: Normal appearance. He is well-developed. He is not ill-appearing.  Comments: Very pleasant male appears stated  age in no acute distress sitting comfortably in exam  HENT:     Head: Normocephalic and atraumatic.  Cardiovascular:     Rate and Rhythm: Normal rate and regular rhythm.     Heart sounds: Normal heart sounds, S1 normal and S2 normal. No murmur heard. Pulmonary:     Effort: Pulmonary effort is normal.     Breath sounds: Normal breath sounds. No stridor. No wheezing, rhonchi or rales.     Comments: Clear to auscultation bilaterally Abdominal:     Palpations: Abdomen is soft.     Tenderness: There is no abdominal tenderness. There is no right CVA tenderness or left CVA tenderness.  Musculoskeletal:     Cervical back: No tenderness or bony tenderness.     Thoracic back: No tenderness or bony tenderness.     Lumbar back: Spasms, tenderness and bony tenderness present. Negative right straight leg raise test and negative left straight leg raise test.       Back:     Comments: Back: Mild pain with percussion of lumbar vertebrae.  No deformity or step-off noted.  Tenderness and spasm noted left lumbar paraspinal muscles.  Strength 5/5 bilateral lower extremities.  Negative straight leg raise bilaterally.  Skin:    Findings: No rash.     Comments: No rash on exam.  Neurological:     Mental Status: He is alert.  Psychiatric:        Behavior: Behavior is cooperative.      UC Treatments / Results  Labs (all labs ordered are listed, but only abnormal results are displayed) Labs Reviewed - No data to display  EKG   Radiology DG Lumbar Spine Complete Result Date: 06/04/2024 CLINICAL DATA:  Left lower back pain intermittently for 3 weeks EXAM: LUMBAR SPINE - COMPLETE 4+ VIEW COMPARISON:  None Available. FINDINGS: Frontal, bilateral oblique, lateral views of the lumbar spine are obtained. There are 5 non-rib-bearing lumbar type vertebral bodies in normal anatomic alignment. No acute fractures. Mild multilevel spondylosis and facet hypertrophy, with facet hypertrophic changes greatest from  L3-4 through L5-S1. Sacroiliac joints are unremarkable. IMPRESSION: 1. Mild lower lumbar spondylosis and facet hypertrophy. 2. No acute fracture. Electronically Signed   By: Ozell Daring M.D.   On: 06/04/2024 14:41    Procedures Procedures (including critical care time)  Medications Ordered in UC Medications - No data to display  Initial Impression / Assessment and Plan / UC Course  I have reviewed the triage vital signs and the nursing notes.  Pertinent labs & imaging results that were available during my care of the patient were reviewed by me and considered in my medical decision making (see chart for details).     Patient is well-appearing, afebrile, nontoxic, nontachycardic.  Patient denies any alarm symptoms that warrant emergent evaluation or imaging.  Concern for muscle spasm/strain as etiology of symptoms given his clinical exam findings but we did obtain lumbar spine films given his remote history of malignancy and some tenderness over vertebrae that showed no acute osseous abnormality but did show degenerative changes.  Will start low-dose baclofen (5 mg at night) to help manage his symptoms.  No indication for dose adjustment based on metabolic panel from 05/18/2024 with creatinine of 1.04 calculated creatinine clearance of 56 mL/min.  He is unable to take NSAIDs due to concern for interaction with several of his prescribed medications but can continue Tylenol  for pain relief.  He was given lidocaine  patch for  additional symptom relief.  We discussed that if his symptoms are not improving he should follow-up with sports medicine and was given the contact information for local provider.  We discussed that if anything worsens or changes he should return for reevaluation.  Strict return precautions given.  Excuse note provided.  Final Clinical Impressions(s) / UC Diagnoses   Final diagnoses:  Acute left-sided low back pain without sciatica     Discharge Instructions      Your  x-ray showed arthritis but nothing broken or out of place.  Start baclofen at night to help with the pain and muscle spasms.  This will make you sleepy so do not drive or drink alcohol with taking it.  Apply lidocaine  patch to the area of tenderness during the day and then remove this at night; use only 1 patch for 24 hours.  You can continue Tylenol  for pain.  Follow-up with sports medicine for further evaluation and management if your symptoms are not improving within a few weeks with the medication I prescribed.  If anything worsens you have increasing pain, difficulty walking, going to the bathroom on yourself without noticing it, fever, weakness you need to be seen immediately.     ED Prescriptions     Medication Sig Dispense Auth. Provider   lidocaine  (LIDODERM ) 5 % Place 1 patch onto the skin daily. Remove & Discard patch within 12 hours or as directed by MD 14 patch Saliou Barnier K, PA-C   Baclofen 5 MG TABS Take 1 tablet (5 mg total) by mouth at bedtime as needed. 10 tablet Dresean Beckel K, PA-C      PDMP not reviewed this encounter.   Sherrell Rocky POUR, PA-C 06/04/24 1456

## 2024-06-16 ENCOUNTER — Telehealth: Payer: Self-pay

## 2024-06-16 MED ORDER — FAMOTIDINE 40 MG PO TABS: 40.0000 mg | ORAL_TABLET | Freq: Every day | ORAL | 1 refills | Status: AC

## 2024-06-16 NOTE — Telephone Encounter (Signed)
 Prescription sent to pharmacy.

## 2024-06-20 ENCOUNTER — Other Ambulatory Visit: Payer: Self-pay | Admitting: Internal Medicine

## 2024-06-20 DIAGNOSIS — B20 Human immunodeficiency virus [HIV] disease: Secondary | ICD-10-CM

## 2024-06-22 ENCOUNTER — Ambulatory Visit

## 2024-06-22 VITALS — BP 126/72 | Ht 64.0 in | Wt 125.0 lb

## 2024-06-22 DIAGNOSIS — M545 Low back pain, unspecified: Secondary | ICD-10-CM | POA: Diagnosis not present

## 2024-06-22 MED ORDER — PREDNISONE 10 MG PO TABS: ORAL_TABLET | ORAL | 0 refills | Status: AC

## 2024-06-22 MED ORDER — LIDOCAINE 5 % EX PTCH: 1.0000 | MEDICATED_PATCH | CUTANEOUS | 0 refills | Status: AC

## 2024-06-22 NOTE — Progress Notes (Signed)
 PCP: Mahlon Comer BRAVO, MD  Subjective:   HPI: Patient is a 68 y.o. male here for lumbar back pain.  He states that this started a couple of months ago without any injury, trauma or accident.  He has no history of surgery or previous back injury.  He states that the pain is mostly on his left side.  He denies any radiating tingling or numbness.  He denies any motor weakness.  He denies any groin numbness, loss of bowel or bladder function.  He did visit with the UC back in October and was prescribed baclofen and lidocaine  patches.  They did obtain an x-ray which did show some degenerative disc and arthritis in his back.  He states that the lidocaine  and baclofen was helpful but the baclofen eventually started causing him dizziness in the morning and he stopped.  He describes the pain as a throbbing pain and a spasm that occurs maybe 1-2 times a day.  It will resolve on its own although it does cause some significant pain during the spasm.  He is been using a heating pad as well as Biofreeze which has been very helpful in his opinion.  Past Medical History:  Diagnosis Date   Anal fistula    Aortic atherosclerosis    Diverticulosis    GERD (gastroesophageal reflux disease)    Hiatal hernia    HIV infection (HCC)    Hypertension    Malignant neoplasm of prostate (HCC) 06/19/2020   Pancreatic cyst    Pneumonia yrs ago   walking pneumonia   Prostate cancer (HCC)    Stroke (HCC) yrs ago   no current neurlogist   Tubular adenoma of colon    Wears contact lenses    Wears dentures    upper full lower partial    Current Outpatient Medications on File Prior to Visit  Medication Sig Dispense Refill   acetaminophen  (TYLENOL ) 650 MG CR tablet Take 1,300 mg by mouth in the morning and at bedtime.     amLODipine  (NORVASC ) 5 MG tablet Take 1 tablet (5 mg total) by mouth daily. 90 tablet 1   Apoaequorin (PREVAGEN PO) Take by mouth.     Baclofen 5 MG TABS Take 1 tablet (5 mg total) by mouth at  bedtime as needed. 10 tablet 0   BIKTARVY  50-200-25 MG TABS tablet TAKE 1 TABLET BY MOUTH DAILY 30 tablet 6   clopidogrel  (PLAVIX ) 75 MG tablet Take 1 tablet (75 mg total) by mouth daily. 90 tablet 3   famotidine  (PEPCID ) 40 MG tablet Take 1 tablet (40 mg total) by mouth daily. 90 tablet 1   metoprolol  succinate (TOPROL -XL) 50 MG 24 hr tablet Take 1 tablet (50 mg total) by mouth daily. Take with or immediately following a meal. 90 tablet 3   Misc. Devices (PILL SPLITTER) MISC Please use to cut trazodone  in 1/2. 1 each 0   Multiple Vitamins-Minerals (MULTIVITAMIN MEN) TABS Take 1 tablet by mouth daily.     olmesartan  (BENICAR ) 40 MG tablet Take 1 tablet (40 mg total) by mouth daily. 90 tablet 1   [name]  800-150 MG tablet TAKE 1 TABLET BY MOUTH DAILY WITH FOOD. DO NOT CRUSH, BREAK OR CHEW TABLETS 30 tablet 6   Propylene Glycol (SYSTANE BALANCE) 0.6 % SOLN Place 1 drop into both eyes as needed (dry eyes).     rosuvastatin  (CRESTOR ) 10 MG tablet Take 1 tablet (10 mg total) by mouth daily. 30 tablet 6   tamsulosin (FLOMAX) 0.4 MG CAPS  capsule Take 0.4 mg by mouth in the morning and at bedtime.     traZODone  (DESYREL ) 100 MG tablet Take 1 tablet (100 mg total) by mouth at bedtime. 90 tablet 1   vitamin B-12 (CYANOCOBALAMIN ) 1000 MCG tablet Take 1 tablet (1,000 mcg total) by mouth daily.     No current facility-administered medications on file prior to visit.    BP 126/72   Ht 5' 4 (1.626 m)   Wt 125 lb (56.7 kg)   BMI 21.46 kg/m        Objective:   Physical Exam:  Gen: NAD, comfortable in exam room Lumbar spine/hip Palpation: Tenderness palpation over the left lumbar paraspinal muscle, no spinous process tenderness Rom: Hip range of motion is full with flexion, there is some tenderness with internal and external rotation at the left lumbar spine region Special Tests: Negative straight leg test both sitting and lying flat Neuro: Bilateral lower extremity sensation is equal and intact,  bilateral hip flexion, knee extension, knee flexion, plantarflexion, dorsiflexion is 5/5.  Assessment/Plan:   Daniel Gonzalez is a 68 y.o. male who was seen today for the following: 1. Acute bilateral low back pain without sciatica (Primary) - Ambulatory referral to Physical Therapy -Patient's exam is reassuring that his pathology is related to musculature versus nerve root compression - X-ray reviewed from urgent care and negative for fractures - Will start him in formal physical therapy as well as send in a steroid Dosepak - Should patient have any worsening symptoms, or pain he can return to see me at any time - We will follow-up in 6 weeks after therapy has been completed   Follow-up/Education:   No follow-ups on file.   May return sooner as needed and encouraged to call/e-mail for additional questions or  worsening symptoms in the interim.  Krystal Lowing, DO Sports Medicine Fellow 06/22/2024 1:16 PM

## 2024-07-06 ENCOUNTER — Other Ambulatory Visit: Payer: Self-pay

## 2024-07-06 ENCOUNTER — Ambulatory Visit: Attending: Family Medicine

## 2024-07-06 ENCOUNTER — Ambulatory Visit

## 2024-07-06 DIAGNOSIS — R2689 Other abnormalities of gait and mobility: Secondary | ICD-10-CM | POA: Diagnosis present

## 2024-07-06 DIAGNOSIS — M5459 Other low back pain: Secondary | ICD-10-CM | POA: Insufficient documentation

## 2024-07-06 DIAGNOSIS — M6281 Muscle weakness (generalized): Secondary | ICD-10-CM | POA: Diagnosis present

## 2024-07-06 NOTE — Therapy (Signed)
 OUTPATIENT PHYSICAL THERAPY THORACOLUMBAR EVALUATION   Patient Name: Daniel Gonzalez MRN: [id number] DOB:Jul 03, 1956, 68 y.o., male Today's Date: 07/06/2024  END OF SESSION:  PT End of Session - 07/06/24 1220     Visit Number 1    Number of Visits 5    Date for Recertification  08/17/24    Authorization Type [employer] MCR    PT Start Time 1140    PT Stop Time 1214    PT Time Calculation (min) 34 min    Activity Tolerance Patient tolerated treatment well    Behavior During Therapy WFL for tasks assessed/performed          Past Medical History:  Diagnosis Date   Anal fistula    Aortic atherosclerosis    Diverticulosis    GERD (gastroesophageal reflux disease)    Hiatal hernia    HIV infection (HCC)    Hypertension    Malignant neoplasm of prostate (HCC) 06/19/2020   Pancreatic cyst    Pneumonia yrs ago   walking pneumonia   Prostate cancer (HCC)    Stroke (HCC) yrs ago   no current neurlogist   Tubular adenoma of colon    Wears contact lenses    Wears dentures    upper full lower partial   Past Surgical History:  Procedure Laterality Date   ? seton placement 10 years ago      BIOPSY  06/02/2022   Procedure: BIOPSY;  Surgeon: Wilhelmenia Aloha Raddle., MD;  Location: [facility];  Service: Gastroenterology;;   COLONOSCOPY     cyst in back     removed from back   ENDOSCOPIC MUCOSAL RESECTION N/A 07/31/2022   Procedure: ENDOSCOPIC MUCOSAL RESECTION;  Surgeon: Wilhelmenia Aloha Raddle., MD;  Location: [facility];  Service: Gastroenterology;  Laterality: N/A;   ESOPHAGOGASTRODUODENOSCOPY N/A 06/02/2022   Procedure: ESOPHAGOGASTRODUODENOSCOPY (EGD);  Surgeon: Wilhelmenia Aloha Raddle., MD;  Location: [facility];  Service: Gastroenterology;  Laterality: N/A;   ESOPHAGOGASTRODUODENOSCOPY (EGD) WITH PROPOFOL  N/A 07/31/2022   Procedure: ESOPHAGOGASTRODUODENOSCOPY (EGD) WITH PROPOFOL ;  Surgeon: Wilhelmenia Aloha Raddle., MD;  Location: [facility];  Service: Gastroenterology;   Laterality: N/A;   EUS N/A 06/02/2022   Procedure: UPPER ENDOSCOPIC ULTRASOUND (EUS) RADIAL;  Surgeon: Wilhelmenia Aloha Raddle., MD;  Location: [facility];  Service: Gastroenterology;  Laterality: N/A;   FINE NEEDLE ASPIRATION N/A 06/02/2022   Procedure: FINE NEEDLE ASPIRATION (FNA) LINEAR;  Surgeon: Wilhelmenia Aloha Raddle., MD;  Location: [facility];  Service: Gastroenterology;  Laterality: N/A;   HEMOSTASIS CLIP PLACEMENT  07/31/2022   Procedure: HEMOSTASIS CLIP PLACEMENT;  Surgeon: Wilhelmenia Aloha Raddle., MD;  Location: [facility];  Service: Gastroenterology;;   PROSTATE BIOPSY  2021   RADIOACTIVE SEED IMPLANT N/A 09/10/2020   Procedure: RADIOACTIVE SEED IMPLANT/BRACHYTHERAPY IMPLANT;  Surgeon: Carolee Sherwood JONETTA DOUGLAS, MD;  Location: [facility];  Service: Urology;  Laterality: N/A;   SPACE OAR INSTILLATION N/A 09/10/2020   Procedure: SPACE OAR INSTILLATION;  Surgeon: Carolee Sherwood JONETTA DOUGLAS, MD;  Location: [facility];  Service: Urology;  Laterality: N/A;   SUBMUCOSAL LIFTING INJECTION  07/31/2022   Procedure: SUBMUCOSAL LIFTING INJECTION;  Surgeon: Wilhelmenia Aloha Raddle., MD;  Location: [facility];  Service: Gastroenterology;;   SUBMUCOSAL TATTOO INJECTION  07/31/2022   Procedure: SUBMUCOSAL TATTOO INJECTION;  Surgeon: Wilhelmenia Aloha Raddle., MD;  Location: [facility];  Service: Gastroenterology;;   TEE WITHOUT CARDIOVERSION N/A 06/24/2013   Procedure: TRANSESOPHAGEAL ECHOCARDIOGRAM (TEE);  Surgeon: Toribio JONELLE Fuel, MD;  Location: [facility];  Service: Cardiovascular;  Laterality: N/A;   Patient Active Problem List   Diagnosis Date Noted   Hyperlipidemia 10/14/2023   Insomnia 10/18/2020   Malignant neoplasm of prostate (HCC) 06/19/2020   Depression 04/17/2020   Chronic diarrhea 09/07/2019   Ruptured epithelial cyst 06/08/2019   Impacted cerumen of left ear 05/17/2019   B12 deficiency 10/23/2017   Unintentional weight loss 12/15/2016    Protein-calorie malnutrition 08/25/2015   Normocytic anemia 08/24/2015   Cerebral aneurysm, nonruptured 09/02/2013   History of CVA (cerebrovascular accident) 06/22/2013   Routine general medical examination at a health care facility 01/07/2013   Essential hypertension 05/01/2010   CARPAL TUNNEL SYNDROME 01/02/2010   Human immunodeficiency virus (HIV) disease (HCC) 09/17/2006   GENITAL HERPES 09/17/2006   ERECTILE DYSFUNCTION 09/17/2006   ABUSE, ALCOHOL, 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   CIGARETTE SMOKER 09/17/2006   DIVERTICULOSIS, COLON W/O HEM 09/17/2006    PCP: Mahlon Comer BRAVO, MD  REFERRING PROVIDER: Rainell Cedar, DO, CAQSM  REFERRING DIAG:  M54.50 (ICD-10-CM) - Acute bilateral low back pain without sciatica  Rationale for Evaluation and Treatment: Rehabilitation  THERAPY DIAG:  Other low back pain  Muscle weakness (generalized)  Other abnormalities of gait and mobility  ONSET DATE: chronic  SUBJECTIVE:                                                                                                                                                                                           SUBJECTIVE STATEMENT: Pt presents to PT with reports of acute on chronic L sided LBP. Denies referral of symptoms into LE or any N/T. Also denies bowel/bladder changes or saddle anesthesia. No trauma or MOI. He does feel like his pain has been getting better and less frequent over last month since urgent care visit.   PERTINENT HISTORY:  HIV, CVA, HTN, prostate cancer  PAIN:  Are you having pain?  Yes: NPRS scale: 0/10 Worst: 4/10 Pain location: L sided of lower back Pain description: sharp, sore Aggravating factors: unsure Relieving factors: rest, medication  PRECAUTIONS: None  RED FLAGS: None   WEIGHT BEARING RESTRICTIONS: No  FALLS:  Has patient fallen in last 6 months? No  LIVING ENVIRONMENT: Lives with: lives alone Lives in: House/apartment Stairs: No Has following  equipment at home: None  OCCUPATION: Retired  PLOF: Independent  PATIENT GOALS: decrease back pain, be able to get some exercises to keep it from coming back  NEXT MD VISIT: 10/05/2024  OBJECTIVE:  Note: Objective measures were completed at Evaluation unless otherwise noted.  DIAGNOSTIC FINDINGS:  Per referring provider:  IMAGING: LSPINE XR completed 06/04/24 personally reviewed and interpreted by me and Dr Jilda showing: -  flattening of normal lumbar lordosis - mild multilevel degenerative changes with associated facet arthropathy   I participated in key portions of the visit related to patient care and decision making.  Rainell Cedar, DO, CAQSM  PATIENT SURVEYS:  Modified Oswestry:  MODIFIED OSWESTRY DISABILITY SCALE  Date: 07/06/2024 Score  Pain intensity 3 =  Pain medication provides me with moderate relief from pain.  2. Personal care (washing, dressing, etc.) 0 =  I can take care of myself normally without causing increased pain.  3. Lifting 0 = I can lift heavy weights without increased pain.  4. Walking 0 = Pain does not prevent me from walking any distance  5. Sitting 0 =  I can sit in any chair as long as I like.  6. Standing 0 =  I can stand as long as I want without increased pain.  7. Sleeping 0 = Pain does not prevent me from sleeping well.  8. Social Life 0 = My social life is normal and does not increase my pain.  9. Traveling 0 =  I can travel anywhere without increased pain.  10. Employment/ Homemaking 0 = My normal homemaking/job activities do not cause pain.  Total 3/50   Interpretation of scores: Score Category Description  0-20% Minimal Disability The patient can cope with most living activities. Usually no treatment is indicated apart from advice on lifting, sitting and exercise  21-40% Moderate Disability The patient experiences more pain and difficulty with sitting, lifting and standing. Travel and social life are more difficult and they may be  disabled from work. Personal care, sexual activity and sleeping are not grossly affected, and the patient can usually be managed by conservative means  41-60% Severe Disability Pain remains the main problem in this group, but activities of daily living are affected. These patients require a detailed investigation  61-80% Crippled Back pain impinges on all aspects of the patient's life. Positive intervention is required  81-100% Bed-bound These patients are either bed-bound or exaggerating their symptoms  Bluford FORBES Zoe DELENA Karon DELENA, et al. Surgery versus conservative management of stable thoracolumbar fracture: the PRESTO feasibility RCT. [place] ([place]): [employer]; 2021 Nov. [facility] Technology Assessment, No. 25.62.) Appendix 3, Oswestry Disability Index category descriptors. Available from: Findjewelers.cz  Minimally Clinically Important Difference (MCID) = 12.8%  COGNITION: Overall cognitive status: Within functional limits for tasks assessed     SENSATION: WFL  POSTURE: increased lumbar lordosis  PALPATION: Slight TTP to L lumbar paraspinals  LUMBAR ROM:   AROM eval  Flexion 75%  Extension 50%  Right lateral flexion   Left lateral flexion   Right rotation 50%  Left rotation 50%   (Blank rows = not tested)   LOWER EXTREMITY MMT:    MMT Right eval Left eval  Hip flexion    Hip extension    Hip abduction    Hip adduction    Hip internal rotation    Hip external rotation    Knee flexion    Knee extension    Ankle dorsiflexion    Ankle plantarflexion    Ankle inversion    Ankle eversion     (Blank rows = not tested)  LUMBAR SPECIAL TESTS:  Straight leg raise test: Negative and Slump test: Negative  FUNCTIONAL TESTS:  30 Second Sit to Stand: 11 reps  GAIT: Distance walked: 47ft Assistive device utilized: None Level of assistance: Complete Independence Comments: L lean  TREATMENT: [facility] Adult PT Treatment:  DATE: 07/06/2024 Therapeutic Exercise: STS x 10 Supine PPT x 5 - 5 hold Bridge x 10 Hooklying clamshell x 10 blue band LTR x 10  PATIENT EDUCATION:  Education details: reviewed initial home exercise program; discussion of POC, prognosis and goals for skilled PT   Person educated: Patient Education method: Explanation, Demonstration, and Handouts Education comprehension: verbalized understanding, returned demonstration, and needs further education  HOME EXERCISE PROGRAM: Access Code: [email or web address] URL: [email or web address] Date: 07/06/2024 Prepared by: Alm Kingdom  Exercises - Sit to Stand Without Arm Support  - 1 x daily - 7 x weekly - 2 sets - 10 reps - Supine Posterior Pelvic Tilt  - 1 x daily - 7 x weekly - 2 sets - 10 reps - 5 sec hold - Supine Bridge  - 1 x daily - 7 x weekly - 3 sets - 10 reps - Hooklying Clamshell with Resistance  - 1 x daily - 7 x weekly - 3 sets - 15 reps - blue band hold - Supine Lower Trunk Rotation  - 1 x daily - 7 x weekly - 2 sets - 10 reps - 5 sec hold  ASSESSMENT:  CLINICAL IMPRESSION: Eero is a 68 y.o. male who was seen today for physical therapy evaluation and treatment for L sided LBP. Physical findings are consistent with MD impression as pt demonstrates decrease in functional mobility and core/hip strength. ODI score shows very minimal disability in performance of home ADLs and community activities. Pt would benefit from skilled PT services working on improving core/hip strength and functional movement in order to decrease pain and improve comfort.   OBJECTIVE IMPAIRMENTS: Abnormal gait, decreased activity tolerance, decreased endurance, decreased mobility, difficulty walking, decreased ROM, decreased strength, and pain  ACTIVITY LIMITATIONS: carrying, lifting, standing, squatting, stairs, transfers, and locomotion level  PARTICIPATION LIMITATIONS: meal prep, cleaning, driving, shopping,  community activity, and yard work  PERSONAL FACTORS: Time since onset of injury/illness/exacerbation and 3+ comorbidities: HIV, CVA, HTN, prostate cancer are also affecting patient's functional outcome.   REHAB POTENTIAL: Excellent  CLINICAL DECISION MAKING: Stable/uncomplicated  EVALUATION COMPLEXITY: Low   GOALS: Goals reviewed with patient? YES  SHORT TERM GOALS: Target date: 08/03/2024  Patient will be independent with initial home program at least 3 days/week.  Baseline: provided at eval Goal Status: INITIAL   2.  Pt will self report low back pain no greater than 2/10 for improved comfort and functional ability Baseline: 4/10 at worst Goal status: INITIAL   LONG TERM GOALS: Target date: 08/17/2024    Pt will be decrease ODI disability score to no greater than 0% as proxy for functional improvement Baseline: 6% disability  Goal status: INITIAL  2.  Pt will self report low back pain no greater than 1/10 for improved comfort and functional ability Baseline: 4/10 at worst Goal status: INITIAL   3.  Pt will increase 30 Second Sit to Stand rep count to no less than 13 reps for improved balance, strength, and functional mobility Baseline: 11 reps  Goal status: INITIAL   4.  Pt will improve L LE MMT to at least 5/5 for improved functional mobility and decreased pain Baseline: see chart Goal status: INITIAL    PLAN:  PT FREQUENCY: 1-2x/week  PT DURATION: 8 weeks  PLANNED INTERVENTIONS: [id number]- PT Re-evaluation, 97110-Therapeutic exercises, 97530- Therapeutic activity, [id number]- Neuromuscular re-education, 97535- Self Care, [id number]- Manual therapy, [id number]- Gait training, [phone]- Electrical stimulation (unattended), [id number]- Electrical stimulation (manual), 20560 (1-2 muscles), 20561 (3+ muscles)- Dry Needling, Patient/Family education, Cryotherapy,  and Moist heat  PLAN FOR NEXT SESSION: assess HEP response, core/hip strengthening, lumbar ROM and functional mobility  Date of  referral: 06/22/2024 Referring provider: Jilda Krystal HERO, DO  Referring diagnosis? M54.50 (ICD-10-CM) - Acute bilateral low back pain without sciatica  Treatment diagnosis? (if different than referring diagnosis) Other low back pain Muscle weakness (generalized) Other abnormalities of gait and mobility  What was this (referring dx) caused by? Arthritis  Nature of Condition: Initial Onset (within last 3 months)   Laterality: Rt  Current Functional Measure Score: ODI: 6% disability  Objective measurements identify impairments when they are compared to normal values, the uninvolved extremity, and prior level of function.  [x]  Yes  []  No  Objective assessment of functional ability: Minimal functional limitations   Briefly describe symptoms:  Pt presents to PT with reports of acute on chronic L sided LBP. Denies referral of symptoms into LE or any N/T. Also denies bowel/bladder changes or saddle anesthesia. No trauma or MOI. He does feel like his pain has been getting better and less frequent over last month since urgent care visit.   How did symptoms start: no trauma or MOI - insidious onset   Average pain intensity:  Last 24 hours: 2/10  Past week: 3/10  How often does the pt experience symptoms? Frequently  How much have the symptoms interfered with usual daily activities? A little bit  How has condition changed since care began at this facility? NA - initial visit  In general, how is the patients overall health? Good   BACK PAIN (STarT Back Screening Tool) Has pain spread down the leg(s) at some time in the last 2 weeks? No Has there been pain in the shoulder or neck at some time in the last 2 weeks? No Has the pt only walked short distances because of back pain? No Has patient dressed more slowly because of back pain in the past 2 weeks? Yes Does patient think it's not safe for a person with this condition to be physically active? No Does patient have worrying thoughts a  lot of the time? No Does patient feel back pain is terrible and will never get any better? No Has patient stopped enjoying things they usually enjoy? No   Alm JAYSON Kingdom, PT 07/06/2024, 12:22 PM

## 2024-07-06 NOTE — Therapy (Incomplete)
 OUTPATIENT PHYSICAL THERAPY THORACOLUMBAR EVALUATION   Patient Name: Daniel Gonzalez MRN: [id number] DOB:[birth year], 68 y.o., male Today's Date: 07/06/2024  END OF SESSION:   Past Medical History:  Diagnosis Date   Anal fistula    Aortic atherosclerosis    Diverticulosis    GERD (gastroesophageal reflux disease)    Hiatal hernia    HIV infection (HCC)    Hypertension    Malignant neoplasm of prostate (HCC) 06/19/2020   Pancreatic cyst    Pneumonia yrs ago   walking pneumonia   Prostate cancer (HCC)    Stroke (HCC) yrs ago   no current neurlogist   Tubular adenoma of colon    Wears contact lenses    Wears dentures    upper full lower partial   Past Surgical History:  Procedure Laterality Date   ? seton placement 10 years ago      BIOPSY  06/02/2022   Procedure: BIOPSY;  Surgeon: Wilhelmenia Aloha Raddle., MD;  Location: [facility];  Service: Gastroenterology;;   COLONOSCOPY     cyst in back     removed from back   ENDOSCOPIC MUCOSAL RESECTION N/A 07/31/2022   Procedure: ENDOSCOPIC MUCOSAL RESECTION;  Surgeon: Wilhelmenia Aloha Raddle., MD;  Location: [facility];  Service: Gastroenterology;  Laterality: N/A;   ESOPHAGOGASTRODUODENOSCOPY N/A 06/02/2022   Procedure: ESOPHAGOGASTRODUODENOSCOPY (EGD);  Surgeon: Wilhelmenia Aloha Raddle., MD;  Location: [facility];  Service: Gastroenterology;  Laterality: N/A;   ESOPHAGOGASTRODUODENOSCOPY (EGD) WITH PROPOFOL  N/A 07/31/2022   Procedure: ESOPHAGOGASTRODUODENOSCOPY (EGD) WITH PROPOFOL ;  Surgeon: Wilhelmenia Aloha Raddle., MD;  Location: [facility];  Service: Gastroenterology;  Laterality: N/A;   EUS N/A 06/02/2022   Procedure: UPPER ENDOSCOPIC ULTRASOUND (EUS) RADIAL;  Surgeon: Wilhelmenia Aloha Raddle., MD;  Location: [facility];  Service: Gastroenterology;  Laterality: N/A;   FINE NEEDLE ASPIRATION N/A 06/02/2022   Procedure: FINE NEEDLE ASPIRATION (FNA) LINEAR;  Surgeon: Wilhelmenia Aloha Raddle., MD;  Location: [facility];  Service: Gastroenterology;  Laterality: N/A;   HEMOSTASIS CLIP PLACEMENT  07/31/2022   Procedure: HEMOSTASIS CLIP PLACEMENT;  Surgeon: Wilhelmenia Aloha Raddle., MD;  Location: [facility];  Service: Gastroenterology;;   PROSTATE BIOPSY  2021   RADIOACTIVE SEED IMPLANT N/A 09/10/2020   Procedure: RADIOACTIVE SEED IMPLANT/BRACHYTHERAPY IMPLANT;  Surgeon: Carolee Sherwood JONETTA DOUGLAS, MD;  Location: [facility];  Service: Urology;  Laterality: N/A;   SPACE OAR INSTILLATION N/A 09/10/2020   Procedure: SPACE OAR INSTILLATION;  Surgeon: Carolee Sherwood JONETTA DOUGLAS, MD;  Location: [facility];  Service: Urology;  Laterality: N/A;   SUBMUCOSAL LIFTING INJECTION  07/31/2022   Procedure: SUBMUCOSAL LIFTING INJECTION;  Surgeon: Wilhelmenia Aloha Raddle., MD;  Location: [facility];  Service: Gastroenterology;;   SUBMUCOSAL TATTOO INJECTION  07/31/2022   Procedure: SUBMUCOSAL TATTOO INJECTION;  Surgeon: Wilhelmenia Aloha Raddle., MD;  Location: [facility];  Service: Gastroenterology;;   TEE WITHOUT CARDIOVERSION N/A 06/24/2013   Procedure: TRANSESOPHAGEAL ECHOCARDIOGRAM (TEE);  Surgeon: Toribio JONELLE Fuel, MD;  Location[facility];  Service: Cardiovascular;  Laterality: N/A;   Patient Active Problem List   Diagnosis Date Noted   Hyperlipidemia 10/14/2023   Insomnia 10/18/2020   Malignant neoplasm of prostate (HCC) 06/19/2020   Depression 04/17/2020   Chronic diarrhea 09/07/2019   Ruptured epithelial cyst 06/08/2019   Impacted cerumen of left ear 05/17/2019   B12 deficiency 10/23/2017   Unintentional weight loss 12/15/2016   Protein-calorie malnutrition 08/25/2015   Normocytic anemia 08/24/2015   Cerebral aneurysm, nonruptured 09/02/2013   History of CVA (cerebrovascular accident)  06/22/2013   Routine general medical examination at a health care facility 01/07/2013   Essential hypertension 05/01/2010   CARPAL TUNNEL SYNDROME 01/02/2010   Human immunodeficiency virus (HIV)  disease (HCC) 09/17/2006   GENITAL HERPES 09/17/2006   ERECTILE DYSFUNCTION 09/17/2006   ABUSE, ALCOHOL, 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  ONSET DATE: ***  SUBJECTIVE:                                                                                                                                                                                           SUBJECTIVE STATEMENT: ***  PERTINENT HISTORY:  ***  PAIN:  Are you having pain? Yes: NPRS scale: *** Pain location: *** Pain description: *** Aggravating factors: *** Relieving factors: ***  PRECAUTIONS: {Therapy precautions:24002}  RED FLAGS: {PT Red Flags:29287}   WEIGHT BEARING RESTRICTIONS: {Yes ***/No:24003}  FALLS:  Has patient fallen in last 6 months? {fallsyesno:27318}  LIVING ENVIRONMENT: Lives with: {[facility] lives with:25569::lives with their family} Lives in: {Lives in:25570} Stairs: {opstairs:27293} Has following equipment at home: {Assistive devices:23999}  OCCUPATION: ***  PLOF: {PLOF:24004}  PATIENT GOALS: ***  NEXT MD VISIT: ***  OBJECTIVE:  Note: Objective measures were completed at Evaluation unless otherwise noted.  DIAGNOSTIC FINDINGS:  Per referring provider:  IMAGING: LSPINE XR completed 06/04/24 personally reviewed and interpreted by me and Dr Jilda showing: - flattening of normal lumbar lordosis - mild multilevel degenerative changes with associated facet arthropathy   I participated in key portions of the visit related to patient care and decision making.  Rainell Cedar, DO, CAQSM  PATIENT SURVEYS:  Modified Oswestry:  MODIFIED  OSWESTRY DISABILITY SCALE  Date: *** Score  Pain intensity {ODI 1:32962}  2. Personal care (washing, dressing, etc.) {ODI 2:32963}  3. Lifting {ODI 3:32964}  4. Walking {ODI 4:32965}  5. Sitting {ODI 5:32966}  6. Standing {ODI 6:32967}  7. Sleeping {ODI 7:32968}  8. Social Life {ODI 8:32969}  9. Traveling {ODI 9:32970}  10. Employment/ Homemaking {ODI 10:32971}  Total ***/50   Interpretation of scores: Score Category Description  0-20% Minimal Disability The patient can cope with most living activities. Usually no treatment is indicated apart from advice on lifting, sitting and exercise  21-40% Moderate Disability The patient experiences more pain and difficulty with  sitting, lifting and standing. Travel and social life are more difficult and they may be disabled from work. Personal care, sexual activity and sleeping are not grossly affected, and the patient can usually be managed by conservative means  41-60% Severe Disability Pain remains the main problem in this group, but activities of daily living are affected. These patients require a detailed investigation  61-80% Crippled Back pain impinges on all aspects of the patient's life. Positive intervention is required  81-100% Bed-bound These patients are either bed-bound or exaggerating their symptoms  Bluford FORBES Zoe DELENA Karon DELENA, et al. Surgery versus conservative management of stable thoracolumbar fracture: the PRESTO feasibility RCT. [place] ([place]): [employer]; 2021 Nov. [facility] Technology Assessment, No. 25.62.) Appendix 3, Oswestry Disability Index category descriptors. Available from: Findjewelers.cz  Minimally Clinically Important Difference (MCID) = 12.8%  COGNITION: Overall cognitive status: {cognition:24006}     SENSATION: {sensation:27233}  MUSCLE LENGTH: Hamstrings: Right *** deg; Left *** deg Debby test: Right *** deg; Left *** deg  POSTURE:  {posture:25561}  PALPATION: ***  LUMBAR ROM:   AROM eval  Flexion   Extension   Right lateral flexion   Left lateral flexion   Right rotation   Left rotation    (Blank rows = not tested)  LOWER EXTREMITY ROM:     {AROM/PROM:27142}  Right eval Left eval  Hip flexion    Hip extension    Hip abduction    Hip adduction    Hip internal rotation    Hip external rotation    Knee flexion    Knee extension    Ankle dorsiflexion    Ankle plantarflexion    Ankle inversion    Ankle eversion     (Blank rows = not tested)  LOWER EXTREMITY MMT:    MMT Right eval Left eval  Hip flexion    Hip extension    Hip abduction    Hip adduction    Hip internal rotation    Hip external rotation    Knee flexion    Knee extension    Ankle dorsiflexion    Ankle plantarflexion    Ankle inversion    Ankle eversion     (Blank rows = not tested)  LUMBAR SPECIAL TESTS:  {lumbar special test:25242}  FUNCTIONAL TESTS:  {Functional tests:24029}  GAIT: Distance walked: *** Assistive device utilized: {Assistive devices:23999} Level of assistance: {Levels of assistance:24026} Comments: ***  TREATMENT DATE:   [facility] Adult PT Treatment:                                                DATE: 07/06/2024   Initial evaluation: see patient education and home exercise program as noted below  PATIENT EDUCATION:  Education details: reviewed initial home exercise program; discussion of POC, prognosis and goals for skilled PT   Person educated: Patient Education method: Explanation, Demonstration, and Handouts Education comprehension: verbalized understanding, returned demonstration, and needs further education  HOME EXERCISE PROGRAM: ***  ASSESSMENT:  CLINICAL IMPRESSION: Jasman is a 68 y.o. male who was seen today for physical therapy evaluation and  treatment for ***. He is demonstrating ***. He has related pain and difficulty with ***. He requires skilled PT services at this time to address relevant deficits and improve overall function.     OBJECTIVE IMPAIRMENTS: {opptimpairments:25111}.   ACTIVITY LIMITATIONS: {activitylimitations:27494}  PARTICIPATION LIMITATIONS: {participationrestrictions:25113}  PERSONAL FACTORS: {Personal factors:25162} are also affecting patient's functional outcome.   REHAB POTENTIAL: {rehabpotential:25112}  CLINICAL DECISION MAKING: {clinical decision making:25114}  EVALUATION COMPLEXITY: {Evaluation complexity:25115}   GOALS: Goals reviewed with patient? YES  SHORT TERM GOALS: Target date: 08/03/2024  Patient will be independent with initial home program at least 3 days/week.  Baseline: provided at eval Goal Status: INITIAL   2.  Patient will demonstrate improved postural awareness for at least 15 minutes while seated without need for cueing from PT.  Baseline: see objective measures Goal Status: INITIAL   3.  *** Baseline:  Goal status: INITIAL   LONG TERM GOALS: Target date: 08/31/2024   Patient will report improved overall functional ability with ODI score of ***.  Baseline:  Goal Status: INITIAL    2.  *** Baseline:  Goal status: INITIAL  3.  *** Baseline:  Goal status: INITIAL  4.  *** Baseline:  Goal status: INITIAL    PLAN:  PT FREQUENCY: 1-2x/week  PT DURATION: 8 weeks  PLANNED INTERVENTIONS: {rehab planned interventions:25118::97110-Therapeutic exercises,97530- Therapeutic [phone]- Neuromuscular re-education,97535- Self Rjmz,02859- Manual therapy,Patient/Family education}.  PLAN FOR NEXT SESSION: PIERRETTE Marko Molt, PT 07/06/2024, 7:35 AM

## 2024-07-22 ENCOUNTER — Ambulatory Visit

## 2024-07-22 DIAGNOSIS — R2689 Other abnormalities of gait and mobility: Secondary | ICD-10-CM | POA: Diagnosis present

## 2024-07-22 DIAGNOSIS — M5459 Other low back pain: Secondary | ICD-10-CM | POA: Diagnosis present

## 2024-07-22 DIAGNOSIS — M6281 Muscle weakness (generalized): Secondary | ICD-10-CM | POA: Diagnosis present

## 2024-07-22 NOTE — Therapy (Signed)
 OUTPATIENT PHYSICAL THERAPY THORACOLUMBAR EVALUATION   Patient Name: Daniel Gonzalez MRN: [id number] DOB:01-15-1956, 68 y.o., male Today's Date: 07/22/2024  END OF SESSION:  PT End of Session - 07/22/24 1144     Visit Number 2    Number of Visits 5    Date for Recertification  08/17/24    Authorization Type [employer] MCR    PT Start Time 1100    PT Stop Time 1140    PT Time Calculation (min) 40 min    Activity Tolerance Patient tolerated treatment well    Behavior During Therapy WFL for tasks assessed/performed           Past Medical History:  Diagnosis Date   Anal fistula    Aortic atherosclerosis    Diverticulosis    GERD (gastroesophageal reflux disease)    Hiatal hernia    HIV infection (HCC)    Hypertension    Malignant neoplasm of prostate (HCC) 06/19/2020   Pancreatic cyst    Pneumonia yrs ago   walking pneumonia   Prostate cancer (HCC)    Stroke (HCC) yrs ago   no current neurlogist   Tubular adenoma of colon    Wears contact lenses    Wears dentures    upper full lower partial   Past Surgical History:  Procedure Laterality Date   ? seton placement 10 years ago      BIOPSY  06/02/2022   Procedure: BIOPSY;  Surgeon: Wilhelmenia Aloha Raddle., MD;  Location: [facility];  Service: Gastroenterology;;   COLONOSCOPY     cyst in back     removed from back   ENDOSCOPIC MUCOSAL RESECTION N/A 07/31/2022   Procedure: ENDOSCOPIC MUCOSAL RESECTION;  Surgeon: Wilhelmenia Aloha Raddle., MD;  Location: [facility];  Service: Gastroenterology;  Laterality: N/A;   ESOPHAGOGASTRODUODENOSCOPY N/A 06/02/2022   Procedure: ESOPHAGOGASTRODUODENOSCOPY (EGD);  Surgeon: Wilhelmenia Aloha Raddle., MD;  Location: [facility];  Service: Gastroenterology;  Laterality: N/A;   ESOPHAGOGASTRODUODENOSCOPY (EGD) WITH PROPOFOL  N/A 07/31/2022   Procedure: ESOPHAGOGASTRODUODENOSCOPY (EGD) WITH PROPOFOL ;  Surgeon: Wilhelmenia Aloha Raddle., MD;  Location: [facility];  Service: Gastroenterology;   Laterality: N/A;   EUS N/A 06/02/2022   Procedure: UPPER ENDOSCOPIC ULTRASOUND (EUS) RADIAL;  Surgeon: Wilhelmenia Aloha Raddle., MD;  Location: [facility];  Service: Gastroenterology;  Laterality: N/A;   FINE NEEDLE ASPIRATION N/A 06/02/2022   Procedure: FINE NEEDLE ASPIRATION (FNA) LINEAR;  Surgeon: Wilhelmenia Aloha Raddle., MD;  Location: [facility];  Service: Gastroenterology;  Laterality: N/A;   HEMOSTASIS CLIP PLACEMENT  07/31/2022   Procedure: HEMOSTASIS CLIP PLACEMENT;  Surgeon: Wilhelmenia Aloha Raddle., MD;  Location: [facility];  Service: Gastroenterology;;   PROSTATE BIOPSY  2021   RADIOACTIVE SEED IMPLANT N/A 09/10/2020   Procedure: RADIOACTIVE SEED IMPLANT/BRACHYTHERAPY IMPLANT;  Surgeon: Carolee Sherwood JONETTA DOUGLAS, MD;  Location: [facility];  Service: Urology;  Laterality: N/A;   SPACE OAR INSTILLATION N/A 09/10/2020   Procedure: SPACE OAR INSTILLATION;  Surgeon: Carolee Sherwood JONETTA DOUGLAS, MD;  Location: [facility];  Service: Urology;  Laterality: N/A;   SUBMUCOSAL LIFTING INJECTION  07/31/2022   Procedure: SUBMUCOSAL LIFTING INJECTION;  Surgeon: Wilhelmenia Aloha Raddle., MD;  Location: [facility];  Service: Gastroenterology;;   SUBMUCOSAL TATTOO INJECTION  07/31/2022   Procedure: SUBMUCOSAL TATTOO INJECTION;  Surgeon: Wilhelmenia Aloha Raddle., MD;  Location: [facility];  Service: Gastroenterology;;   TEE WITHOUT CARDIOVERSION N/A 06/24/2013   Procedure: TRANSESOPHAGEAL ECHOCARDIOGRAM (TEE);  Surgeon: Toribio JONELLE Fuel, MD;  Location: [facility];  Service: Cardiovascular;  Laterality: N/A;   Patient Active Problem List   Diagnosis Date Noted   Hyperlipidemia 10/14/2023   Insomnia 10/18/2020   Malignant neoplasm of prostate (HCC) 06/19/2020   Depression 04/17/2020   Chronic diarrhea 09/07/2019   Ruptured epithelial cyst 06/08/2019   Impacted cerumen of left ear 05/17/2019   B12 deficiency 10/23/2017   Unintentional weight loss 12/15/2016    Protein-calorie malnutrition 08/25/2015   Normocytic anemia 08/24/2015   Cerebral aneurysm, nonruptured 09/02/2013   History of CVA (cerebrovascular accident) 06/22/2013   Routine general medical examination at a health care facility 01/07/2013   Essential hypertension 05/01/2010   CARPAL TUNNEL SYNDROME 01/02/2010   Human immunodeficiency virus (HIV) disease (HCC) 09/17/2006   GENITAL HERPES 09/17/2006   ERECTILE DYSFUNCTION 09/17/2006   ABUSE, ALCOHOL, 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   CIGARETTE SMOKER 09/17/2006   DIVERTICULOSIS, COLON W/O HEM 09/17/2006    PCP: Mahlon Comer BRAVO, MD  REFERRING PROVIDER: Rainell Cedar, DO, CAQSM  REFERRING DIAG:  M54.50 (ICD-10-CM) - Acute bilateral low back pain without sciatica  Rationale for Evaluation and Treatment: Rehabilitation  THERAPY DIAG:  Other low back pain  Muscle weakness (generalized)  ONSET DATE: chronic  SUBJECTIVE:                                                                                                                                                                                           SUBJECTIVE STATEMENT: Back throbbing again. Did some exercises and icy hot that helped pain. Maybe wants to go back to person that referred him here if back gets worse again. 2/10 at most as of right now.   EVAL: Pt presents to PT with reports of acute on chronic L sided LBP. Denies referral of symptoms into LE or any N/T. Also denies bowel/bladder changes or saddle anesthesia. No trauma or MOI. He does feel like his pain has been getting better and less frequent over last month since urgent care visit.   PERTINENT HISTORY:  HIV, CVA, HTN, prostate cancer  PAIN:  Are you having pain?  Yes: NPRS scale: 0/10 Worst: 4/10 Pain location: L sided of lower back Pain description: sharp, sore Aggravating factors: unsure Relieving factors: rest, medication  PRECAUTIONS: None  RED FLAGS: None   WEIGHT BEARING RESTRICTIONS: No  FALLS:   Has patient fallen in last 6 months? No  LIVING ENVIRONMENT: Lives with: lives alone Lives in: House/apartment Stairs: No Has following equipment at home: None  OCCUPATION: Retired  PLOF: Independent  PATIENT GOALS: decrease back pain, be able to get some exercises to keep it from coming back  NEXT MD VISIT: 10/05/2024  OBJECTIVE:  Note: Objective  measures were completed at Evaluation unless otherwise noted.  DIAGNOSTIC FINDINGS:  Per referring provider:  IMAGING: LSPINE XR completed 06/04/24 personally reviewed and interpreted by me and Dr Jilda showing: - flattening of normal lumbar lordosis - mild multilevel degenerative changes with associated facet arthropathy   I participated in key portions of the visit related to patient care and decision making.  Rainell Cedar, DO, CAQSM  PATIENT SURVEYS:  Modified Oswestry:  MODIFIED OSWESTRY DISABILITY SCALE  Date: 07/06/2024 Score  Pain intensity 3 =  Pain medication provides me with moderate relief from pain.  2. Personal care (washing, dressing, etc.) 0 =  I can take care of myself normally without causing increased pain.  3. Lifting 0 = I can lift heavy weights without increased pain.  4. Walking 0 = Pain does not prevent me from walking any distance  5. Sitting 0 =  I can sit in any chair as long as I like.  6. Standing 0 =  I can stand as long as I want without increased pain.  7. Sleeping 0 = Pain does not prevent me from sleeping well.  8. Social Life 0 = My social life is normal and does not increase my pain.  9. Traveling 0 =  I can travel anywhere without increased pain.  10. Employment/ Homemaking 0 = My normal homemaking/job activities do not cause pain.  Total 3/50   Interpretation of scores: Score Category Description  0-20% Minimal Disability The patient can cope with most living activities. Usually no treatment is indicated apart from advice on lifting, sitting and exercise  21-40% Moderate Disability The  patient experiences more pain and difficulty with sitting, lifting and standing. Travel and social life are more difficult and they may be disabled from work. Personal care, sexual activity and sleeping are not grossly affected, and the patient can usually be managed by conservative means  41-60% Severe Disability Pain remains the main problem in this group, but activities of daily living are affected. These patients require a detailed investigation  61-80% Crippled Back pain impinges on all aspects of the patient's life. Positive intervention is required  81-100% Bed-bound These patients are either bed-bound or exaggerating their symptoms  Bluford FORBES Zoe DELENA Karon DELENA, et al. Surgery versus conservative management of stable thoracolumbar fracture: the PRESTO feasibility RCT. [place] ([place]): [employer]; 2021 Nov. [facility] Technology Assessment, No. 25.62.) Appendix 3, Oswestry Disability Index category descriptors. Available from: Findjewelers.cz  Minimally Clinically Important Difference (MCID) = 12.8%  COGNITION: Overall cognitive status: Within functional limits for tasks assessed     SENSATION: WFL  POSTURE: increased lumbar lordosis  PALPATION: Slight TTP to L lumbar paraspinals  LUMBAR ROM:   AROM eval  Flexion 75%  Extension 50%  Right lateral flexion   Left lateral flexion   Right rotation 50%  Left rotation 50%   (Blank rows = not tested)   LOWER EXTREMITY MMT:    MMT Right eval Left eval  Hip flexion    Hip extension    Hip abduction    Hip adduction    Hip internal rotation    Hip external rotation    Knee flexion    Knee extension    Ankle dorsiflexion    Ankle plantarflexion    Ankle inversion    Ankle eversion     (Blank rows = not tested)  LUMBAR SPECIAL TESTS:  Straight leg raise test: Negative and Slump test: Negative  FUNCTIONAL TESTS:  30 Second Sit  to Stand: 11 reps  GAIT: Distance walked:  73ft Assistive device utilized: None Level of assistance: Complete Independence Comments: L lean  TREATMENT: TREATMENT 07/22/2024:   Neuromuscular re-ed: *Patient required extra time for exercises due to increased monitoring, reassessment, and rest due to low activity tolerance and/or high irritability of symptoms* *Patient required extra time for exercises due to additional reeducation on pain science, optimal loading, prognosis, and relevant tissues/anatomy*  HEP reassessment and update Supine PPT 2x8x3s Supine LTR 2x8x3s Supine clamshell 2x8x3s Seated side bend x8 Seated rotations x8x3s Seated march w/ ppt x8x3s      [facility] Adult PT Treatment:                                                DATE: 07/06/2024 Therapeutic Exercise: STS x 10 Supine PPT x 5 - 5 hold Bridge x 10 Hooklying clamshell x 10 blue band LTR x 10  PATIENT EDUCATION:  Education details: reviewed initial home exercise program; discussion of POC, prognosis and goals for skilled PT   Person educated: Patient Education method: Explanation, Demonstration, and Handouts Education comprehension: verbalized understanding, returned demonstration, and needs further education  HOME EXERCISE PROGRAM: Access Code: [email or web address] URL: [email or web address] Date: 07/06/2024 Prepared by: Alm Kingdom  Exercises - Sit to Stand Without Arm Support  - 1 x daily - 7 x weekly - 2 sets - 10 reps - Supine Posterior Pelvic Tilt  - 1 x daily - 7 x weekly - 2 sets - 10 reps - 5 sec hold - Supine Bridge  - 1 x daily - 7 x weekly - 3 sets - 10 reps - Hooklying Clamshell with Resistance  - 1 x daily - 7 x weekly - 3 sets - 15 reps - blue band hold - Supine Lower Trunk Rotation  - 1 x daily - 7 x weekly - 2 sets - 10 reps - 5 sec hold  Supine PPT Supine LTR  Supine clamshell  ASSESSMENT:  CLINICAL IMPRESSION: Patient tolerated treatment with no increases in pain with progressions in sx modulation, core and  glute loading. Current deficits include: excessive pain, functional strength, and ROM. As a result, patient would continue to benefit from skilled PT to address said deficits via plan below.     EVAL: Tobiah is a 68 y.o. male who was seen today for physical therapy evaluation and treatment for L sided LBP. Physical findings are consistent with MD impression as pt demonstrates decrease in functional mobility and core/hip strength. ODI score shows very minimal disability in performance of home ADLs and community activities. Pt would benefit from skilled PT services working on improving core/hip strength and functional movement in order to decrease pain and improve comfort.   OBJECTIVE IMPAIRMENTS: Abnormal gait, decreased activity tolerance, decreased endurance, decreased mobility, difficulty walking, decreased ROM, decreased strength, and pain  ACTIVITY LIMITATIONS: carrying, lifting, standing, squatting, stairs, transfers, and locomotion level  PARTICIPATION LIMITATIONS: meal prep, cleaning, driving, shopping, community activity, and yard work  PERSONAL FACTORS: Time since onset of injury/illness/exacerbation and 3+ comorbidities: HIV, CVA, HTN, prostate cancer are also affecting patient's functional outcome.   REHAB POTENTIAL: Excellent  CLINICAL DECISION MAKING: Stable/uncomplicated  EVALUATION COMPLEXITY: Low   GOALS: Goals reviewed with patient? YES  SHORT TERM GOALS: Target date: 08/03/2024  Patient will be independent with initial home program at least 3 days/week.  Baseline: provided  at eval Goal Status: INITIAL   2.  Pt will self report low back pain no greater than 2/10 for improved comfort and functional ability Baseline: 4/10 at worst Goal status: INITIAL   LONG TERM GOALS: Target date: 08/17/2024    Pt will be decrease ODI disability score to no greater than 0% as proxy for functional improvement Baseline: 6% disability  Goal status: INITIAL  2.  Pt will self  report low back pain no greater than 1/10 for improved comfort and functional ability Baseline: 4/10 at worst Goal status: INITIAL   3.  Pt will increase 30 Second Sit to Stand rep count to no less than 13 reps for improved balance, strength, and functional mobility Baseline: 11 reps  Goal status: INITIAL   4.  Pt will improve L LE MMT to at least 5/5 for improved functional mobility and decreased pain Baseline: see chart Goal status: INITIAL    PLAN:  PT FREQUENCY: 1-2x/week  PT DURATION: 8 weeks  PLANNED INTERVENTIONS: [id number]- PT Re-evaluation, 97110-Therapeutic exercises, 97530- Therapeutic activity, [id number]- Neuromuscular re-education, 97535- Self Care, [id number]- Manual therapy, [id number]- Gait training, [phone]- Electrical stimulation (unattended), [id number]- Electrical stimulation (manual), 20560 (1-2 muscles), [id number] (3+ muscles)- Dry Needling, Patient/Family education, Cryotherapy, and Moist heat  PLAN FOR NEXT SESSION: assess HEP response, core/hip strengthening, lumbar ROM and functional mobility. Sx modulation w/ core and posterior strengthening  Date of referral: 06/22/2024 Referring provider: Jilda Krystal HERO, DO  Referring diagnosis? M54.50 (ICD-10-CM) - Acute bilateral low back pain without sciatica  Treatment diagnosis? (if different than referring diagnosis) Other low back pain Muscle weakness (generalized) Other abnormalities of gait and mobility  What was this (referring dx) caused by? Arthritis  Nature of Condition: Initial Onset (within last 3 months)   Laterality: Rt  Current Functional Measure Score: ODI: 6% disability  Objective measurements identify impairments when they are compared to normal values, the uninvolved extremity, and prior level of function.  [x]  Yes  []  No  Objective assessment of functional ability: Minimal functional limitations   Briefly describe symptoms:  Pt presents to PT with reports of acute on chronic L sided LBP. Denies referral of symptoms  into LE or any N/T. Also denies bowel/bladder changes or saddle anesthesia. No trauma or MOI. He does feel like his pain has been getting better and less frequent over last month since urgent care visit.   How did symptoms start: no trauma or MOI - insidious onset   Average pain intensity:  Last 24 hours: 2/10  Past week: 3/10  How often does the pt experience symptoms? Frequently  How much have the symptoms interfered with usual daily activities? A little bit  How has condition changed since care began at this facility? NA - initial visit  In general, how is the patients overall health? Good   BACK PAIN (STarT Back Screening Tool) Has pain spread down the leg(s) at some time in the last 2 weeks? No Has there been pain in the shoulder or neck at some time in the last 2 weeks? No Has the pt only walked short distances because of back pain? No Has patient dressed more slowly because of back pain in the past 2 weeks? Yes Does patient think it's not safe for a person with this condition to be physically active? No Does patient have worrying thoughts a lot of the time? No Does patient feel back pain is terrible and will never get any better? No Has patient stopped enjoying things they usually  enjoy? No   Washington Greener  [place], PT 07/22/2024, 11:48 AM

## 2024-07-27 ENCOUNTER — Ambulatory Visit: Admitting: Physical Therapy

## 2024-07-27 ENCOUNTER — Encounter: Payer: Self-pay | Admitting: Physical Therapy

## 2024-07-27 DIAGNOSIS — M6281 Muscle weakness (generalized): Secondary | ICD-10-CM

## 2024-07-27 DIAGNOSIS — M5459 Other low back pain: Secondary | ICD-10-CM

## 2024-07-27 DIAGNOSIS — R2689 Other abnormalities of gait and mobility: Secondary | ICD-10-CM

## 2024-07-27 NOTE — Therapy (Signed)
 OUTPATIENT PHYSICAL THERAPY THORACOLUMBAR EVALUATION   Patient Name: Daniel Gonzalez MRN: [id number] DOB:[birth year], 68 y.o., male Today's Date: 07/27/2024  END OF SESSION:  PT End of Session - 07/27/24 1059     Visit Number 3    Number of Visits 5    Date for Recertification  08/17/24    Authorization Type [employer] MCR    PT Start Time 1100    PT Stop Time 1141    PT Time Calculation (min) 41 min    Activity Tolerance Patient tolerated treatment well    Behavior During Therapy WFL for tasks assessed/performed           Past Medical History:  Diagnosis Date   Anal fistula    Aortic atherosclerosis    Diverticulosis    GERD (gastroesophageal reflux disease)    Hiatal hernia    HIV infection (HCC)    Hypertension    Malignant neoplasm of prostate (HCC) 06/19/2020   Pancreatic cyst    Pneumonia yrs ago   walking pneumonia   Prostate cancer (HCC)    Stroke (HCC) yrs ago   no current neurlogist   Tubular adenoma of colon    Wears contact lenses    Wears dentures    upper full lower partial   Past Surgical History:  Procedure Laterality Date   ? seton placement 10 years ago      BIOPSY  06/02/2022   Procedure: BIOPSY;  Surgeon: Wilhelmenia Aloha Raddle., MD;  Location: [facility];  Service: Gastroenterology;;   COLONOSCOPY     cyst in back     removed from back   ENDOSCOPIC MUCOSAL RESECTION N/A 07/31/2022   Procedure: ENDOSCOPIC MUCOSAL RESECTION;  Surgeon: Wilhelmenia Aloha Raddle., MD;  Location: [facility];  Service: Gastroenterology;  Laterality: N/A;   ESOPHAGOGASTRODUODENOSCOPY N/A 06/02/2022   Procedure: ESOPHAGOGASTRODUODENOSCOPY (EGD);  Surgeon: Wilhelmenia Aloha Raddle., MD;  Location: [facility];  Service: Gastroenterology;  Laterality: N/A;   ESOPHAGOGASTRODUODENOSCOPY (EGD) WITH PROPOFOL  N/A 07/31/2022   Procedure: ESOPHAGOGASTRODUODENOSCOPY (EGD) WITH PROPOFOL ;  Surgeon: Wilhelmenia Aloha Raddle., MD;  Location: [facility];  Service:  Gastroenterology;  Laterality: N/A;   EUS N/A 06/02/2022   Procedure: UPPER ENDOSCOPIC ULTRASOUND (EUS) RADIAL;  Surgeon: Wilhelmenia Aloha Raddle., MD;  Location: [facility];  Service: Gastroenterology;  Laterality: N/A;   FINE NEEDLE ASPIRATION N/A 06/02/2022   Procedure: FINE NEEDLE ASPIRATION (FNA) LINEAR;  Surgeon: Wilhelmenia Aloha Raddle., MD;  Location: [facility];  Service: Gastroenterology;  Laterality: N/A;   HEMOSTASIS CLIP PLACEMENT  07/31/2022   Procedure: HEMOSTASIS CLIP PLACEMENT;  Surgeon: Wilhelmenia Aloha Raddle., MD;  Location: [facility];  Service: Gastroenterology;;   PROSTATE BIOPSY  2021   RADIOACTIVE SEED IMPLANT N/A 09/10/2020   Procedure: RADIOACTIVE SEED IMPLANT/BRACHYTHERAPY IMPLANT;  Surgeon: Carolee Sherwood JONETTA DOUGLAS, MD;  Location[facility];  Service: Urology;  Laterality: N/A;   SPACE OAR INSTILLATION N/A 09/10/2020   Procedure: SPACE OAR INSTILLATION;  Surgeon: Carolee Sherwood JONETTA DOUGLAS, MD;  Location: [facility];  Service: Urology;  Laterality: N/A;   SUBMUCOSAL LIFTING INJECTION  07/31/2022   Procedure: SUBMUCOSAL LIFTING INJECTION;  Surgeon: Wilhelmenia Aloha Raddle., MD;  Location: [facility];  Service: Gastroenterology;;   SUBMUCOSAL TATTOO INJECTION  07/31/2022   Procedure: SUBMUCOSAL TATTOO INJECTION;  Surgeon: Wilhelmenia Aloha Raddle., MD;  Location: [facility];  Service: Gastroenterology;;   TEE WITHOUT CARDIOVERSION N/A 06/24/2013   Procedure: TRANSESOPHAGEAL ECHOCARDIOGRAM (TEE);  Surgeon: Toribio JONELLE Fuel, MD;  Location: [facility];  Service: Cardiovascular;  Laterality: N/A;   Patient Active Problem List   Diagnosis Date Noted   Hyperlipidemia 10/14/2023   Insomnia 10/18/2020   Malignant neoplasm of prostate (HCC) 06/19/2020   Depression 04/17/2020   Chronic diarrhea 09/07/2019   Ruptured epithelial cyst 06/08/2019   Impacted cerumen of left ear 05/17/2019   B12 deficiency 10/23/2017   Unintentional weight loss  12/15/2016   Protein-calorie malnutrition 08/25/2015   Normocytic anemia 08/24/2015   Cerebral aneurysm, nonruptured 09/02/2013   History of CVA (cerebrovascular accident) 06/22/2013   Routine general medical examination at a health care facility 01/07/2013   Essential hypertension 05/01/2010   CARPAL TUNNEL SYNDROME 01/02/2010   Human immunodeficiency virus (HIV) disease (HCC) 09/17/2006   GENITAL HERPES 09/17/2006   ERECTILE DYSFUNCTION 09/17/2006   ABUSE, ALCOHOL, 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   CIGARETTE SMOKER 09/17/2006   DIVERTICULOSIS, COLON W/O HEM 09/17/2006    PCP: Mahlon Comer BRAVO, MD  REFERRING PROVIDER: Rainell Cedar, DO, CAQSM  REFERRING DIAG:  M54.50 (ICD-10-CM) - Acute bilateral low back pain without sciatica  Rationale for Evaluation and Treatment: Rehabilitation  THERAPY DIAG:  Other low back pain  Muscle weakness (generalized)  Other abnormalities of gait and mobility  ONSET DATE: chronic  SUBJECTIVE:                                                                                                                                                                                           SUBJECTIVE STATEMENT: Pt reports that he was doing pretty well.  Completing his HEP a few times a day.  Had some increase in pain after taking a shower.   EVAL: Pt presents to PT with reports of acute on chronic L sided LBP. Denies referral of symptoms into LE or any N/T. Also denies bowel/bladder changes or saddle anesthesia. No trauma or MOI. He does feel like his pain has been getting better and less frequent over last month since urgent care visit.   PERTINENT HISTORY:  HIV, CVA, HTN, prostate cancer  PAIN:  Are you having pain?  Yes: NPRS scale: 0/10 Worst: 4/10 Pain location: L sided of lower back Pain description: sharp, sore Aggravating factors: unsure Relieving factors: rest, medication  PRECAUTIONS: None  RED FLAGS: None   WEIGHT BEARING RESTRICTIONS:  No  FALLS:  Has patient fallen in last 6 months? No  LIVING ENVIRONMENT: Lives with: lives alone Lives in: House/apartment Stairs: No Has following equipment at home: None  OCCUPATION: Retired  PLOF: Independent  PATIENT GOALS: decrease back pain, be able to get some exercises to keep it from coming back  NEXT MD VISIT: 10/05/2024  OBJECTIVE:  Note: Objective measures  were completed at Evaluation unless otherwise noted.  DIAGNOSTIC FINDINGS:  Per referring provider:  IMAGING: LSPINE XR completed 06/04/24 personally reviewed and interpreted by me and Dr Jilda showing: - flattening of normal lumbar lordosis - mild multilevel degenerative changes with associated facet arthropathy   I participated in key portions of the visit related to patient care and decision making.  Rainell Cedar, DO, CAQSM  PATIENT SURVEYS:  Modified Oswestry:  MODIFIED OSWESTRY DISABILITY SCALE  Date: 07/06/2024 Score  Pain intensity 3 =  Pain medication provides me with moderate relief from pain.  2. Personal care (washing, dressing, etc.) 0 =  I can take care of myself normally without causing increased pain.  3. Lifting 0 = I can lift heavy weights without increased pain.  4. Walking 0 = Pain does not prevent me from walking any distance  5. Sitting 0 =  I can sit in any chair as long as I like.  6. Standing 0 =  I can stand as long as I want without increased pain.  7. Sleeping 0 = Pain does not prevent me from sleeping well.  8. Social Life 0 = My social life is normal and does not increase my pain.  9. Traveling 0 =  I can travel anywhere without increased pain.  10. Employment/ Homemaking 0 = My normal homemaking/job activities do not cause pain.  Total 3/50   Interpretation of scores: Score Category Description  0-20% Minimal Disability The patient can cope with most living activities. Usually no treatment is indicated apart from advice on lifting, sitting and exercise  21-40% Moderate  Disability The patient experiences more pain and difficulty with sitting, lifting and standing. Travel and social life are more difficult and they may be disabled from work. Personal care, sexual activity and sleeping are not grossly affected, and the patient can usually be managed by conservative means  41-60% Severe Disability Pain remains the main problem in this group, but activities of daily living are affected. These patients require a detailed investigation  61-80% Crippled Back pain impinges on all aspects of the patients life. Positive intervention is required  81-100% Bed-bound These patients are either bed-bound or exaggerating their symptoms  Bluford FORBES Zoe DELENA Karon DELENA, et al. Surgery versus conservative management of stable thoracolumbar fracture: the PRESTO feasibility RCT. [place] ([place]): [employer]; 2021 Nov. [facility] Technology Assessment, No. 25.62.) Appendix 3, Oswestry Disability Index category descriptors. Available from: Findjewelers.cz  Minimally Clinically Important Difference (MCID) = 12.8%  COGNITION: Overall cognitive status: Within functional limits for tasks assessed     SENSATION: WFL  POSTURE: increased lumbar lordosis  PALPATION: Slight TTP to L lumbar paraspinals  LUMBAR ROM:   AROM eval  Flexion 75%  Extension 50%  Right lateral flexion   Left lateral flexion   Right rotation 50%  Left rotation 50%   (Blank rows = not tested)   LOWER EXTREMITY MMT:    MMT Right eval Left eval  Hip flexion    Hip extension    Hip abduction    Hip adduction    Hip internal rotation    Hip external rotation    Knee flexion    Knee extension    Ankle dorsiflexion    Ankle plantarflexion    Ankle inversion    Ankle eversion     (Blank rows = not tested)  LUMBAR SPECIAL TESTS:  Straight leg raise test: Negative and Slump test: Negative  FUNCTIONAL TESTS:  30 Second Sit to  Stand: 11 reps  GAIT: Distance  walked: 9ft Assistive device utilized: None Level of assistance: Complete Independence Comments: L lean  TREATMENT:   [facility] Adult PT Treatment:                                                DATE: 07/27/2024 Therapeutic Exercise: nu-step L5 74m while taking subjective and planning session with patient STS x 10 2x5 - 10# Supine PPT 2x8 - 5 hold - max cuing - dynadisc under sacrum Bridge 2x8 - 5'' hold - w/ pball SL clamshell 2x8 YTB LTR x 10 ea Standing hip abd Standing hip ext  TREATMENT 07/22/2024:   Neuromuscular re-ed: *Patient required extra time for exercises due to increased monitoring, reassessment, and rest due to low activity tolerance and/or high irritability of symptoms* *Patient required extra time for exercises due to additional reeducation on pain science, optimal loading, prognosis, and relevant tissues/anatomy*  HEP reassessment and update Supine PPT 2x8x3s Supine LTR 2x8x3s Supine clamshell 2x8x3s Seated side bend x8 Seated rotations x8x3s Seated march w/ ppt x8x3s      [facility] Adult PT Treatment:                                                DATE: 07/06/2024 Therapeutic Exercise: STS x 10 Supine PPT x 5 - 5 hold Bridge x 10 Hooklying clamshell x 10 blue band LTR x 10  PATIENT EDUCATION:  Education details: reviewed initial home exercise program; discussion of POC, prognosis and goals for skilled PT   Person educated: Patient Education method: Explanation, Demonstration, and Handouts Education comprehension: verbalized understanding, returned demonstration, and needs further education  HOME EXERCISE PROGRAM: Access Code: [email or web address] URL: [email or web address] Date: 07/06/2024 Prepared by: Alm Kingdom  Exercises - Sit to Stand Without Arm Support  - 1 x daily - 7 x weekly - 2 sets - 10 reps - Supine Posterior Pelvic Tilt  - 1 x daily - 7 x weekly - 2 sets - 10 reps - 5 sec hold - Supine Bridge  - 1 x daily - 7 x weekly - 3 sets  - 10 reps - Hooklying Clamshell with Resistance  - 1 x daily - 7 x weekly - 3 sets - 15 reps - blue band hold - Supine Lower Trunk Rotation  - 1 x daily - 7 x weekly - 2 sets - 10 reps - 5 sec hold  Supine PPT Supine LTR  Supine clamshell  ASSESSMENT:  CLINICAL IMPRESSION: Patient tolerated treatment with no increases in pain with progressions core and glute loading. Increased intensity and/or volume of several exercises as noted.  Current deficits include: excessive pain, functional strength, and ROM. As a result, patient would continue to benefit from skilled PT to address said deficits via plan below.     EVAL: Caedyn is a 68 y.o. male who was seen today for physical therapy evaluation and treatment for L sided LBP. Physical findings are consistent with MD impression as pt demonstrates decrease in functional mobility and core/hip strength. ODI score shows very minimal disability in performance of home ADLs and community activities. Pt would benefit from skilled PT services working on improving core/hip strength and functional movement in order to decrease pain  and improve comfort.   OBJECTIVE IMPAIRMENTS: Abnormal gait, decreased activity tolerance, decreased endurance, decreased mobility, difficulty walking, decreased ROM, decreased strength, and pain  ACTIVITY LIMITATIONS: carrying, lifting, standing, squatting, stairs, transfers, and locomotion level  PARTICIPATION LIMITATIONS: meal prep, cleaning, driving, shopping, community activity, and yard work  PERSONAL FACTORS: Time since onset of injury/illness/exacerbation and 3+ comorbidities: HIV, CVA, HTN, prostate cancer are also affecting patient's functional outcome.   REHAB POTENTIAL: Excellent  CLINICAL DECISION MAKING: Stable/uncomplicated  EVALUATION COMPLEXITY: Low   GOALS: Goals reviewed with patient? YES  SHORT TERM GOALS: Target date: 08/03/2024  Patient will be independent with initial home program at least 3  days/week.  Baseline: provided at eval Goal Status: INITIAL   2.  Pt will self report low back pain no greater than 2/10 for improved comfort and functional ability Baseline: 4/10 at worst Goal status: INITIAL   LONG TERM GOALS: Target date: 08/17/2024    Pt will be decrease ODI disability score to no greater than 0% as proxy for functional improvement Baseline: 6% disability  Goal status: INITIAL  2.  Pt will self report low back pain no greater than 1/10 for improved comfort and functional ability Baseline: 4/10 at worst Goal status: INITIAL   3.  Pt will increase 30 Second Sit to Stand rep count to no less than 13 reps for improved balance, strength, and functional mobility Baseline: 11 reps  Goal status: INITIAL   4.  Pt will improve L LE MMT to at least 5/5 for improved functional mobility and decreased pain Baseline: see chart Goal status: INITIAL    PLAN:  PT FREQUENCY: 1-2x/week  PT DURATION: 8 weeks  PLANNED INTERVENTIONS: [id number]- PT Re-evaluation, 97110-Therapeutic exercises, 97530- Therapeutic activity, [id number]- Neuromuscular re-education, 97535- Self Care, [id number]- Manual therapy, [id number]- Gait training, [id number]- Electrical stimulation (unattended), [id number]- Electrical stimulation (manual), 20560 (1-2 muscles), 20561 (3+ muscles)- Dry Needling, Patient/Family education, Cryotherapy, and Moist heat  PLAN FOR NEXT SESSION: assess HEP response, core/hip strengthening, lumbar ROM and functional mobility. Sx modulation w/ core and posterior strengthening  Date of referral: 06/22/2024 Referring provider: Jilda Krystal HERO, DO  Referring diagnosis? M54.50 (ICD-10-CM) - Acute bilateral low back pain without sciatica  Treatment diagnosis? (if different than referring diagnosis) Other low back pain Muscle weakness (generalized) Other abnormalities of gait and mobility  What was this (referring dx) caused by? Arthritis  Nature of Condition: Initial Onset (within last 3  months)   Laterality: Rt  Current Functional Measure Score: ODI: 6% disability  Objective measurements identify impairments when they are compared to normal values, the uninvolved extremity, and prior level of function.  [x]  Yes  []  No  Objective assessment of functional ability: Minimal functional limitations   Briefly describe symptoms:  Pt presents to PT with reports of acute on chronic L sided LBP. Denies referral of symptoms into LE or any N/T. Also denies bowel/bladder changes or saddle anesthesia. No trauma or MOI. He does feel like his pain has been getting better and less frequent over last month since urgent care visit.   How did symptoms start: no trauma or MOI - insidious onset   Average pain intensity:  Last 24 hours: 2/10  Past week: 3/10  How often does the pt experience symptoms? Frequently  How much have the symptoms interfered with usual daily activities? A little bit  How has condition changed since care began at this facility? NA - initial visit  In general, how is the patients overall health? Good  BACK PAIN (STarT Back Screening Tool) Has pain spread down the leg(s) at some time in the last 2 weeks? No Has there been pain in the shoulder or neck at some time in the last 2 weeks? No Has the pt only walked short distances because of back pain? No Has patient dressed more slowly because of back pain in the past 2 weeks? Yes Does patient think it's not safe for a person with this condition to be physically active? No Does patient have worrying thoughts a lot of the time? No Does patient feel back pain is terrible and will never get any better? No Has patient stopped enjoying things they usually enjoy? No   Helene FORBES Gasmen, PT 07/27/2024, 11:41 AM

## 2024-08-03 ENCOUNTER — Ambulatory Visit

## 2024-08-15 ENCOUNTER — Encounter: Payer: Self-pay | Admitting: Physical Therapy

## 2024-08-15 ENCOUNTER — Ambulatory Visit: Admitting: Physical Therapy

## 2024-08-15 DIAGNOSIS — M5459 Other low back pain: Secondary | ICD-10-CM | POA: Diagnosis not present

## 2024-08-15 NOTE — Therapy (Addendum)
 " OUTPATIENT PHYSICAL THERAPY THORACOLUMBAR TREATMENT/DISCHARGE  PHYSICAL THERAPY DISCHARGE SUMMARY  Visits from Start of Care: 4  Current functional level related to goals / functional outcomes: See goals and objective   Remaining deficits: See goals and objective   Education / Equipment: HEP   Patient agrees to discharge. Patient goals were partially met. Patient is being discharged due to the patient's request.   Patient Name: Daniel Gonzalez MRN: [id number] DOB:06/27/1956, 68 y.o., male Today's Date: 08/15/2024  END OF SESSION:  PT End of Session - 08/15/24 1358     Visit Number 4    Number of Visits 5    Date for Recertification  08/17/24    Authorization Type [employer] MCR- shara requested    PT Start Time 0200    PT Stop Time 0245    PT Time Calculation (min) 45 min           Past Medical History:  Diagnosis Date   Anal fistula    Aortic atherosclerosis    Diverticulosis    GERD (gastroesophageal reflux disease)    Hiatal hernia    HIV infection (HCC)    Hypertension    Malignant neoplasm of prostate (HCC) 06/19/2020   Pancreatic cyst    Pneumonia yrs ago   walking pneumonia   Prostate cancer (HCC)    Stroke (HCC) yrs ago   no current neurlogist   Tubular adenoma of colon    Wears contact lenses    Wears dentures    upper full lower partial   Past Surgical History:  Procedure Laterality Date   ? seton placement 10 years ago      BIOPSY  06/02/2022   Procedure: BIOPSY;  Surgeon: Wilhelmenia Aloha Raddle., MD;  Location: [facility];  Service: Gastroenterology;;   COLONOSCOPY     cyst in back     removed from back   ENDOSCOPIC MUCOSAL RESECTION N/A 07/31/2022   Procedure: ENDOSCOPIC MUCOSAL RESECTION;  Surgeon: Wilhelmenia Aloha Raddle., MD;  Location: [facility];  Service: Gastroenterology;  Laterality: N/A;   ESOPHAGOGASTRODUODENOSCOPY N/A 06/02/2022   Procedure: ESOPHAGOGASTRODUODENOSCOPY (EGD);  Surgeon: Wilhelmenia Aloha Raddle., MD;  Location:  [facility];  Service: Gastroenterology;  Laterality: N/A;   ESOPHAGOGASTRODUODENOSCOPY (EGD) WITH PROPOFOL  N/A 07/31/2022   Procedure: ESOPHAGOGASTRODUODENOSCOPY (EGD) WITH PROPOFOL ;  Surgeon: Wilhelmenia Aloha Raddle., MD;  Location: [facility];  Service: Gastroenterology;  Laterality: N/A;   EUS N/A 06/02/2022   Procedure: UPPER ENDOSCOPIC ULTRASOUND (EUS) RADIAL;  Surgeon: Wilhelmenia Aloha Raddle., MD;  Location: [facility];  Service: Gastroenterology;  Laterality: N/A;   FINE NEEDLE ASPIRATION N/A 06/02/2022   Procedure: FINE NEEDLE ASPIRATION (FNA) LINEAR;  Surgeon: Wilhelmenia Aloha Raddle., MD;  Location: [facility];  Service: Gastroenterology;  Laterality: N/A;   HEMOSTASIS CLIP PLACEMENT  07/31/2022   Procedure: HEMOSTASIS CLIP PLACEMENT;  Surgeon: Wilhelmenia Aloha Raddle., MD;  Location: [facility];  Service: Gastroenterology;;   PROSTATE BIOPSY  2021   RADIOACTIVE SEED IMPLANT N/A 09/10/2020   Procedure: RADIOACTIVE SEED IMPLANT/BRACHYTHERAPY IMPLANT;  Surgeon: Carolee Sherwood JONETTA DOUGLAS, MD;  Location: [facility];  Service: Urology;  Laterality: N/A;   SPACE OAR INSTILLATION N/A 09/10/2020   Procedure: SPACE OAR INSTILLATION;  Surgeon: Carolee Sherwood JONETTA DOUGLAS, MD;  Location: [facility];  Service: Urology;  Laterality: N/A;   SUBMUCOSAL LIFTING INJECTION  07/31/2022   Procedure: SUBMUCOSAL LIFTING INJECTION;  Surgeon: Wilhelmenia Aloha Raddle., MD;  Location: [facility];  Service: Gastroenterology;;   SUBMUCOSAL TATTOO INJECTION  07/31/2022  Procedure: SUBMUCOSAL TATTOO INJECTION;  Surgeon: Wilhelmenia Aloha Raddle., MD;  Location: [facility];  Service: Gastroenterology;;   TEE WITHOUT CARDIOVERSION N/A 06/24/2013   Procedure: TRANSESOPHAGEAL ECHOCARDIOGRAM (TEE);  Surgeon: Toribio JONELLE Fuel, MD;  Location: [facility];  Service: Cardiovascular;  Laterality: N/A;   Patient Active Problem List   Diagnosis Date Noted   Hyperlipidemia 10/14/2023   Insomnia  10/18/2020   Malignant neoplasm of prostate (HCC) 06/19/2020   Depression 04/17/2020   Chronic diarrhea 09/07/2019   Ruptured epithelial cyst 06/08/2019   Impacted cerumen of left ear 05/17/2019   B12 deficiency 10/23/2017   Unintentional weight loss 12/15/2016   Protein-calorie malnutrition 08/25/2015   Normocytic anemia 08/24/2015   Cerebral aneurysm, nonruptured 09/02/2013   History of CVA (cerebrovascular accident) 06/22/2013   Routine general medical examination at a health care facility 01/07/2013   Essential hypertension 05/01/2010   CARPAL TUNNEL SYNDROME 01/02/2010   Human immunodeficiency virus (HIV) disease (HCC) 09/17/2006   GENITAL HERPES 09/17/2006   ERECTILE DYSFUNCTION 09/17/2006   ABUSE, ALCOHOL, 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 Will just start throbbing on left side when sitting. Reaching around back and lifting arm to put on coat can    Pt reports that he was doing pretty well.  Completing his HEP a few times a day.  Had some increase in pain after taking a shower.   EVAL: Pt presents to PT with reports of acute on chronic L sided LBP. Denies referral of symptoms into LE or any N/T. Also denies bowel/bladder changes or saddle anesthesia. No trauma or MOI. He does feel like his pain has been getting better and less frequent over last month since urgent care visit.   PERTINENT  HISTORY:  HIV, CVA, HTN, prostate cancer  PAIN:  Are you having pain?  Yes: NPRS scale: 0/10 Worst: 4/10 Pain location: L sided of lower back Pain description: sharp, sore Aggravating factors: unsure Relieving factors: rest, medication  PRECAUTIONS: None  RED FLAGS: None   WEIGHT BEARING RESTRICTIONS: No  FALLS:  Has patient fallen in last 6 months? No  LIVING ENVIRONMENT: Lives with: lives alone Lives in: House/apartment Stairs: No Has following equipment at home: None  OCCUPATION: Retired  PLOF: Independent  PATIENT GOALS: decrease back pain, be able to get some exercises to keep it from coming back  NEXT MD VISIT: 10/05/2024  OBJECTIVE:  Note: Objective measures were completed at Evaluation unless otherwise noted.  DIAGNOSTIC FINDINGS:  Per referring provider:  IMAGING: LSPINE XR completed 06/04/24 personally reviewed and interpreted by me and Dr Jilda showing: - flattening of normal lumbar lordosis - mild multilevel degenerative changes with associated facet arthropathy   I participated in key portions of the visit related to patient care and decision making.  Rainell Cedar, DO, CAQSM  PATIENT SURVEYS:  Modified Oswestry:  MODIFIED OSWESTRY DISABILITY SCALE  Date: 07/06/2024 Score  Pain intensity 3 =  Pain medication provides me with moderate relief from pain.  2. Personal care (washing, dressing, etc.) 0 =  I can take care of myself normally without causing increased pain.  3. Lifting 0 = I can lift heavy weights without increased pain.  4. Walking 0 = Pain does not prevent me from walking any distance  5. Sitting 0 =  I can sit in any chair as long as I like.  6. Standing 0 =  I can stand as long as I want without increased pain.  7. Sleeping 0 = Pain does not prevent me from sleeping well.  8. Social Life 0 = My social life is normal and does not increase my pain.  9. Traveling 0 =  I can travel anywhere without increased pain.  10. Employment/  Homemaking 0 = My normal homemaking/job activities do not cause pain.  Total 3/50 08/15/24: 4/50   Interpretation of scores: Score Category Description  0-20% Minimal Disability The patient can cope with most living activities. Usually no treatment is indicated apart from advice on lifting, sitting and exercise  21-40% Moderate Disability The patient experiences more pain and difficulty with sitting, lifting and standing. Travel and social life are more difficult and they may be disabled from work. Personal care, sexual activity and sleeping are not grossly affected, and the patient can usually be managed by conservative means  41-60% Severe Disability Pain remains the main problem in this group, but activities of daily living are affected. These patients require a detailed investigation  61-80% Crippled Back pain impinges on all aspects of the patients life. Positive intervention is required  81-100% Bed-bound These patients are either bed-bound or exaggerating their symptoms  Bluford FORBES Zoe DELENA Karon DELENA, et al. Surgery versus conservative management of stable thoracolumbar fracture: the PRESTO feasibility RCT. [place] ([place]): [employer]; 2021 Nov. [facility] Technology Assessment, No. 25.62.) Appendix 3, Oswestry Disability Index category descriptors. Available from: Findjewelers.cz  Minimally Clinically Important Difference (MCID) = 12.8%  COGNITION: Overall cognitive status: Within functional limits for tasks assessed     SENSATION: WFL  POSTURE: increased lumbar lordosis  PALPATION: Slight TTP to L lumbar paraspinals  LUMBAR ROM:   AROM eval 08/15/24  Flexion 75% Reaches toes  Extension 50% 75%   Right lateral flexion    Left lateral flexion    Right rotation 50% 75%   Left rotation 50% 75%    (Blank rows = not tested)   LOWER EXTREMITY MMT:    MMT Right eval Left eval  Hip flexion    Hip extension    Hip abduction    Hip  adduction    Hip internal rotation    Hip external rotation    Knee flexion    Knee extension    Ankle dorsiflexion    Ankle plantarflexion    Ankle inversion    Ankle eversion     (Blank rows = not tested)  LUMBAR SPECIAL TESTS:  Straight leg raise test: Negative and Slump test: Negative  FUNCTIONAL TESTS:  30 Second Sit to Stand: 11 reps 08/15/24: no change   GAIT: Distance walked: 34ft Assistive device utilized: None Level of assistance: Complete Independence Comments: L lean  TREATMENT: [facility] Adult PT Treatment:                                                DATE: 08/15/24 Therapeutic Exercise/activiy:  Review and finalize HEP ODI AROM 30 sec STS    [facility] Adult PT Treatment:                                                DATE: 07/27/2024 Therapeutic Exercise: nu-step L5 60m while taking subjective and planning session with patient STS x 10 2x5 - 10# Supine PPT 2x8 - 5 hold - max cuing - dynadisc under sacrum Bridge 2x8 - 5'' hold - w/ pball SL clamshell 2x8 YTB LTR x 10 ea Standing hip abd Standing hip ext  TREATMENT 07/22/2024:   Neuromuscular re-ed: *Patient required extra time for exercises due to increased monitoring, reassessment, and rest due to low activity tolerance and/or high irritability of symptoms* *Patient required extra time for exercises due to additional reeducation on pain science, optimal loading, prognosis, and relevant tissues/anatomy*  HEP reassessment and update Supine PPT 2x8x3s Supine LTR 2x8x3s Supine clamshell 2x8x3s Seated side bend x8 Seated rotations x8x3s Seated march w/ ppt x8x3s      [facility] Adult PT Treatment:                                                DATE: 07/06/2024 Therapeutic Exercise: STS x 10 Supine PPT x 5 - 5 hold Bridge x 10 Hooklying clamshell x 10 blue band LTR x 10  PATIENT EDUCATION:  Education details: reviewed initial home exercise program; discussion of POC, prognosis and goals for skilled  PT   Person educated: Patient Education method: Explanation, Demonstration, and Handouts Education comprehension: verbalized understanding, returned demonstration, and needs further education  HOME EXERCISE PROGRAM: Access Code: [email or web address] URL: [email or web address] Date: 07/06/2024 Prepared by: Alm Kingdom  Exercises - Sit to Stand Without Arm Support  - 1 x daily - 7 x weekly - 2 sets - 10 reps - Supine Posterior Pelvic Tilt  - 1 x daily - 7 x weekly - 2 sets - 10 reps - 5 sec hold - Supine Bridge  - 1 x daily - 7 x weekly - 3 sets - 10 reps - Hooklying Clamshell with Resistance  - 1 x daily - 7 x weekly - 3 sets - 15 reps - blue band hold - Supine Lower Trunk Rotation  - 1 x daily - 7 x weekly - 2 sets - 10 reps -  5 sec hold  Supine PPT Supine LTR  Supine clamshell Standing shouder ext blue   ASSESSMENT:  CLINICAL IMPRESSION: Pt reports 40-50% improvement , pain is more intermittent. Can have days without pain. Today no pain. Reports consistency with HEP. He does continue to have episodes of pain described as throbbing on his left side which occurs while sitting and other times when reaching out or behind. He has reached the end of POC and is not interested in extending his POC due to high co-pay. He will follow up with MD to discuss continued pain and possible treatments. Will DC to HEP today.      EVAL: Chidubem is a 68 y.o. male who was seen today for physical therapy evaluation and treatment for L sided LBP. Physical findings are consistent with MD impression as pt demonstrates decrease in functional mobility and core/hip strength. ODI score shows very minimal disability in performance of home ADLs and community activities. Pt would benefit from skilled PT services working on improving core/hip strength and functional movement in order to decrease pain and improve comfort.   OBJECTIVE IMPAIRMENTS: Abnormal gait, decreased activity tolerance, decreased endurance,  decreased mobility, difficulty walking, decreased ROM, decreased strength, and pain  ACTIVITY LIMITATIONS: carrying, lifting, standing, squatting, stairs, transfers, and locomotion level  PARTICIPATION LIMITATIONS: meal prep, cleaning, driving, shopping, community activity, and yard work  PERSONAL FACTORS: Time since onset of injury/illness/exacerbation and 3+ comorbidities: HIV, CVA, HTN, prostate cancer are also affecting patient's functional outcome.   REHAB POTENTIAL: Excellent  CLINICAL DECISION MAKING: Stable/uncomplicated  EVALUATION COMPLEXITY: Low   GOALS: Goals reviewed with patient? YES  SHORT TERM GOALS: Target date: 08/03/2024  Patient will be independent with initial home program at least 3 days/week.  Baseline: provided at eval Goal Status: MET   2.  Pt will self report low back pain no greater than 2/10 for improved comfort and functional ability Baseline: 4/10 at worst 08/15/24: 4/10, more intermittent  Goal status: NOT MET   LONG TERM GOALS: Target date: 08/17/2024    Pt will be decrease ODI disability score to no greater than 0% as proxy for functional improvement Baseline: 6% disability  Goal status: INITIAL  2.  Pt will self report low back pain no greater than 1/10 for improved comfort and functional ability Baseline: 4/10 at worst 08/15/24: still reaches 4/10 , not as often Goal status: MET    3.  Pt will increase 30 Second Sit to Stand rep count to no less than 13 reps for improved balance, strength, and functional mobility Baseline: 11 reps  08/15/24: 11 reps  Goal status: Not MET   4.  Pt will improve L LE MMT to at least 5/5 for improved functional mobility and decreased pain Baseline: see chart 08/15/24: never tested  Goal status: Deferred    PLAN:  PT FREQUENCY: 1-2x/week  PT DURATION: 8 weeks  PLANNED INTERVENTIONS: 97164- PT Re-evaluation, 97110-Therapeutic exercises, 97530- Therapeutic activity, 97112- Neuromuscular  re-education, 97535- Self Care, [id number]- Manual therapy, [id number]- Gait training, [phone]- Electrical stimulation (unattended), [phone]- Electrical stimulation (manual), [phone] (1-2 muscles), 20561 (3+ muscles)- Dry Needling, Patient/Family education, Cryotherapy, and Moist heat    Date of referral: 06/22/2024 Referring provider: Jilda Krystal HERO, DO  Referring diagnosis? M54.50 (ICD-10-CM) - Acute bilateral low back pain without sciatica  Treatment diagnosis? (if different than referring diagnosis) Other low back pain Muscle weakness (generalized) Other abnormalities of gait and mobility  What was this (referring dx) caused by? Arthritis  Nature of Condition: Initial  Onset (within last 3 months)   Laterality: Rt  Current Functional Measure Score: ODI: 6% disability  Objective measurements identify impairments when they are compared to normal values, the uninvolved extremity, and prior level of function.  [x]  Yes  []  No  Objective assessment of functional ability: Minimal functional limitations   Briefly describe symptoms:  Pt presents to PT with reports of acute on chronic L sided LBP. Denies referral of symptoms into LE or any N/T. Also denies bowel/bladder changes or saddle anesthesia. No trauma or MOI. He does feel like his pain has been getting better and less frequent over last month since urgent care visit.   How did symptoms start: no trauma or MOI - insidious onset   Average pain intensity:  Last 24 hours: 2/10  Past week: 3/10  How often does the pt experience symptoms? Frequently  How much have the symptoms interfered with usual daily activities? A little bit  How has condition changed since care began at this facility? NA - initial visit  In general, how is the patients overall health? Good   BACK PAIN (STarT Back Screening Tool) Has pain spread down the leg(s) at some time in the last 2 weeks? No Has there been pain in the shoulder or neck at some time in the last 2  weeks? No Has the pt only walked short distances because of back pain? No Has patient dressed more slowly because of back pain in the past 2 weeks? Yes Does patient think it's not safe for a person with this condition to be physically active? No Does patient have worrying thoughts a lot of the time? No Does patient feel back pain is terrible and will never get any better? No Has patient stopped enjoying things they usually enjoy? No   Harlene Persons, PTA 08/15/2024 2:44 PM Phone: [phone] Fax: [phone]   "

## 2024-09-09 ENCOUNTER — Other Ambulatory Visit: Payer: Self-pay

## 2024-09-09 DIAGNOSIS — E785 Hyperlipidemia, unspecified: Secondary | ICD-10-CM

## 2024-09-09 NOTE — Telephone Encounter (Signed)
 Called patient to verify, appears he would have been out of this medication almost 6 months ago, patient did not answer, LM to call back so we can verify if he has been out for a while or if he has been taking it the entire time.

## 2024-09-09 NOTE — Telephone Encounter (Signed)
 Copied from CRM #[id number]. Topic: Clinical - Medication Refill >> Sep 09, 2024  3:25 PM Chasity T wrote: Medication: rosuvastatin  (CRESTOR ) 10 MG tablet  Has the patient contacted their pharmacy? Yes (Agent: If no, request that the patient contact the pharmacy for the refill. If patient does not wish to contact the pharmacy document the reason why and proceed with request.) (Agent: If yes, when and what did the pharmacy advise?)  This is the patient's preferred pharmacy:  [id number] - [place], [place] - [address] [place] [address] Phone: [phone] Fax: [phone]    Is this the correct pharmacy for this prescription? Yes If no, delete pharmacy and type the correct one.   Has the prescription been filled recently? No  Is the patient out of the medication? Yes  Has the patient been seen for an appointment in the last year OR does the patient have an upcoming appointment? Yes  Can we respond through MyChart? Yes  Agent: Please be advised that Rx refills may take up to 3 business days. We ask that you follow-up with your pharmacy.

## 2024-09-12 MED ORDER — ROSUVASTATIN CALCIUM 10 MG PO TABS: 10.0000 mg | ORAL_TABLET | Freq: Every day | ORAL | 6 refills | Status: AC

## 2024-10-05 ENCOUNTER — Ambulatory Visit: Admitting: Family Medicine

## 2024-11-16 ENCOUNTER — Ambulatory Visit: Admitting: Internal Medicine
